# Patient Record
Sex: Male | Born: 1945
Health system: Southern US, Community
[De-identification: ages and names within clinical notes are randomized; demographics above are authoritative.]

## PROBLEM LIST (undated history)

## (undated) DIAGNOSIS — I10 Essential (primary) hypertension: Secondary | ICD-10-CM

## (undated) DIAGNOSIS — E785 Hyperlipidemia, unspecified: Secondary | ICD-10-CM

## (undated) DIAGNOSIS — I442 Atrioventricular block, complete: Secondary | ICD-10-CM

## (undated) DIAGNOSIS — I2510 Atherosclerotic heart disease of native coronary artery without angina pectoris: Secondary | ICD-10-CM

## (undated) DIAGNOSIS — G4733 Obstructive sleep apnea (adult) (pediatric): Secondary | ICD-10-CM

## (undated) HISTORY — DX: Atrioventricular block, complete: I44.2

## (undated) HISTORY — DX: Obstructive sleep apnea (adult) (pediatric): G47.33

## (undated) HISTORY — DX: Atherosclerotic heart disease of native coronary artery without angina pectoris: I25.10

## (undated) HISTORY — DX: Essential (primary) hypertension: I10

## (undated) HISTORY — PX: CARDIAC CATHETERIZATION: SHX172

## (undated) HISTORY — DX: Hyperlipidemia, unspecified: E78.5

---

## 1999-02-05 ENCOUNTER — Other Ambulatory Visit: Admission: RE | Admit: 1999-02-05 | Discharge: 1999-02-05 | Payer: Self-pay | Admitting: Orthopedic Surgery

## 2002-01-05 ENCOUNTER — Emergency Department (HOSPITAL_COMMUNITY): Admission: EM | Admit: 2002-01-05 | Discharge: 2002-01-06 | Payer: Self-pay | Admitting: Emergency Medicine

## 2004-09-22 ENCOUNTER — Ambulatory Visit: Payer: Self-pay | Admitting: Internal Medicine

## 2004-11-23 ENCOUNTER — Ambulatory Visit: Payer: Self-pay | Admitting: Internal Medicine

## 2005-05-18 ENCOUNTER — Ambulatory Visit: Payer: Self-pay | Admitting: Internal Medicine

## 2005-06-06 ENCOUNTER — Encounter: Admission: RE | Admit: 2005-06-06 | Discharge: 2005-06-06 | Payer: Self-pay | Admitting: Internal Medicine

## 2005-06-27 ENCOUNTER — Ambulatory Visit (HOSPITAL_COMMUNITY): Admission: RE | Admit: 2005-06-27 | Discharge: 2005-06-27 | Payer: Self-pay | Admitting: Internal Medicine

## 2005-10-30 ENCOUNTER — Ambulatory Visit: Payer: Self-pay | Admitting: Internal Medicine

## 2005-11-01 ENCOUNTER — Ambulatory Visit: Payer: Self-pay | Admitting: Cardiology

## 2005-11-15 ENCOUNTER — Ambulatory Visit: Payer: Self-pay | Admitting: Internal Medicine

## 2006-01-09 ENCOUNTER — Ambulatory Visit: Payer: Self-pay | Admitting: Internal Medicine

## 2006-01-22 ENCOUNTER — Ambulatory Visit: Payer: Self-pay | Admitting: Internal Medicine

## 2006-07-23 ENCOUNTER — Ambulatory Visit: Payer: Self-pay | Admitting: Internal Medicine

## 2006-07-25 ENCOUNTER — Ambulatory Visit: Payer: Self-pay | Admitting: Internal Medicine

## 2006-08-07 ENCOUNTER — Ambulatory Visit: Payer: Self-pay | Admitting: Cardiovascular Disease

## 2006-12-21 ENCOUNTER — Ambulatory Visit: Payer: Self-pay | Admitting: Internal Medicine

## 2007-04-15 ENCOUNTER — Telehealth: Payer: Self-pay | Admitting: Internal Medicine

## 2007-04-17 DIAGNOSIS — I059 Rheumatic mitral valve disease, unspecified: Secondary | ICD-10-CM | POA: Insufficient documentation

## 2007-07-16 ENCOUNTER — Telehealth (INDEPENDENT_AMBULATORY_CARE_PROVIDER_SITE_OTHER): Payer: Self-pay | Admitting: *Deleted

## 2007-07-19 ENCOUNTER — Ambulatory Visit: Payer: Self-pay | Admitting: Internal Medicine

## 2007-07-21 LAB — CONVERTED CEMR LAB
Total CHOL/HDL Ratio: 3.5
Triglycerides: 193 mg/dL — ABNORMAL HIGH (ref 0–149)

## 2007-07-22 ENCOUNTER — Encounter (INDEPENDENT_AMBULATORY_CARE_PROVIDER_SITE_OTHER): Payer: Self-pay | Admitting: *Deleted

## 2007-08-05 ENCOUNTER — Ambulatory Visit: Payer: Self-pay | Admitting: Internal Medicine

## 2007-08-05 LAB — CONVERTED CEMR LAB: HDL goal, serum: 40 mg/dL

## 2007-08-06 ENCOUNTER — Encounter (INDEPENDENT_AMBULATORY_CARE_PROVIDER_SITE_OTHER): Payer: Self-pay | Admitting: *Deleted

## 2007-09-14 ENCOUNTER — Encounter: Payer: Self-pay | Admitting: Internal Medicine

## 2007-10-03 ENCOUNTER — Ambulatory Visit: Payer: Self-pay | Admitting: Internal Medicine

## 2008-02-11 ENCOUNTER — Ambulatory Visit: Payer: Self-pay | Admitting: Internal Medicine

## 2008-02-11 LAB — CONVERTED CEMR LAB
Cholesterol: 184 mg/dL (ref 0–200)
HDL: 48.4 mg/dL (ref >39.0)
Triglycerides: 230 mg/dL (ref 0–149)
VLDL: 46 mg/dL — ABNORMAL HIGH (ref 0–40)

## 2008-02-17 ENCOUNTER — Ambulatory Visit: Payer: Self-pay | Admitting: Internal Medicine

## 2008-02-17 DIAGNOSIS — E785 Hyperlipidemia, unspecified: Secondary | ICD-10-CM

## 2008-02-17 DIAGNOSIS — G4730 Sleep apnea, unspecified: Secondary | ICD-10-CM

## 2008-02-17 DIAGNOSIS — E7800 Pure hypercholesterolemia, unspecified: Secondary | ICD-10-CM | POA: Insufficient documentation

## 2008-02-17 DIAGNOSIS — I471 Supraventricular tachycardia: Secondary | ICD-10-CM | POA: Insufficient documentation

## 2008-02-17 DIAGNOSIS — G4710 Hypersomnia, unspecified: Secondary | ICD-10-CM | POA: Insufficient documentation

## 2008-02-17 LAB — CONVERTED CEMR LAB: LDL Goal: 130 mg/dL

## 2008-02-18 ENCOUNTER — Encounter (INDEPENDENT_AMBULATORY_CARE_PROVIDER_SITE_OTHER): Payer: Self-pay | Admitting: *Deleted

## 2008-02-24 ENCOUNTER — Encounter (INDEPENDENT_AMBULATORY_CARE_PROVIDER_SITE_OTHER): Payer: Self-pay | Admitting: *Deleted

## 2008-02-24 ENCOUNTER — Ambulatory Visit: Payer: Self-pay | Admitting: Internal Medicine

## 2008-02-24 LAB — CONVERTED CEMR LAB: OCCULT 3: NEGATIVE

## 2008-03-03 ENCOUNTER — Ambulatory Visit: Payer: Self-pay | Admitting: Pulmonary Disease

## 2008-04-14 DIAGNOSIS — G4733 Obstructive sleep apnea (adult) (pediatric): Secondary | ICD-10-CM | POA: Insufficient documentation

## 2008-06-04 ENCOUNTER — Ambulatory Visit: Payer: Self-pay | Admitting: Internal Medicine

## 2008-06-04 DIAGNOSIS — R42 Dizziness and giddiness: Secondary | ICD-10-CM

## 2008-06-04 DIAGNOSIS — R9431 Abnormal electrocardiogram [ECG] [EKG]: Secondary | ICD-10-CM

## 2008-06-08 ENCOUNTER — Ambulatory Visit: Payer: Self-pay

## 2008-06-08 ENCOUNTER — Encounter: Payer: Self-pay | Admitting: Internal Medicine

## 2008-06-15 ENCOUNTER — Ambulatory Visit: Payer: Self-pay | Admitting: Cardiology

## 2008-06-16 ENCOUNTER — Encounter: Payer: Self-pay | Admitting: Internal Medicine

## 2008-06-16 ENCOUNTER — Ambulatory Visit: Payer: Self-pay

## 2008-06-16 ENCOUNTER — Ambulatory Visit: Payer: Self-pay | Admitting: Cardiology

## 2008-06-18 ENCOUNTER — Ambulatory Visit: Payer: Self-pay

## 2008-06-18 ENCOUNTER — Encounter: Payer: Self-pay | Admitting: Internal Medicine

## 2008-06-18 ENCOUNTER — Ambulatory Visit: Payer: Self-pay | Admitting: Cardiovascular Disease

## 2008-06-18 LAB — CONVERTED CEMR LAB
Basophils Absolute: 0 10*3/uL (ref 0.0–0.1)
Basophils Relative: 0.6 % (ref 0.0–3.0)
CO2: 29 meq/L (ref 19–32)
Calcium: 10.1 mg/dL (ref 8.4–10.5)
Chloride: 99 meq/L (ref 96–112)
Creatinine, Ser: 1.2 mg/dL (ref 0.4–1.5)
Glucose, Bld: 96 mg/dL (ref 70–99)
Hemoglobin: 15.5 g/dL (ref 13.0–17.0)
INR: 1 (ref 0.8–1.0)
Lymphocytes Relative: 36 % (ref 12.0–46.0)
MCHC: 34.1 g/dL (ref 30.0–36.0)
Monocytes Relative: 7.5 % (ref 3.0–12.0)
Neutro Abs: 3.1 10*3/uL (ref 1.4–7.7)
Neutrophils Relative %: 53.2 % (ref 43.0–77.0)
Prothrombin Time: 11.7 s (ref 10.9–13.3)
RBC: 4.55 M/uL (ref 4.22–5.81)
WBC: 6 10*3/uL (ref 4.5–10.5)
aPTT: 30.8 s — ABNORMAL HIGH (ref 21.7–29.8)

## 2008-06-19 ENCOUNTER — Ambulatory Visit: Payer: Self-pay

## 2008-06-19 ENCOUNTER — Inpatient Hospital Stay (HOSPITAL_BASED_OUTPATIENT_CLINIC_OR_DEPARTMENT_OTHER): Admission: RE | Admit: 2008-06-19 | Discharge: 2008-06-19 | Payer: Self-pay | Admitting: Cardiovascular Disease

## 2008-06-19 ENCOUNTER — Ambulatory Visit: Payer: Self-pay | Admitting: Cardiovascular Disease

## 2008-07-01 ENCOUNTER — Ambulatory Visit: Payer: Self-pay | Admitting: Cardiology

## 2008-07-10 ENCOUNTER — Ambulatory Visit: Payer: Self-pay | Admitting: Internal Medicine

## 2008-08-13 ENCOUNTER — Ambulatory Visit: Payer: Self-pay | Admitting: Internal Medicine

## 2008-08-13 DIAGNOSIS — N400 Enlarged prostate without lower urinary tract symptoms: Secondary | ICD-10-CM | POA: Insufficient documentation

## 2008-08-13 LAB — CONVERTED CEMR LAB
Cholesterol: 181 mg/dL (ref 0–200)
Direct LDL: 109.1 mg/dL
HDL: 46.5 mg/dL (ref >39.0)
Total CHOL/HDL Ratio: 3.9
VLDL: 52 mg/dL — ABNORMAL HIGH (ref 0–40)

## 2008-08-25 ENCOUNTER — Ambulatory Visit: Payer: Self-pay | Admitting: Internal Medicine

## 2008-09-02 ENCOUNTER — Ambulatory Visit: Payer: Self-pay | Admitting: Internal Medicine

## 2008-10-06 ENCOUNTER — Telehealth (INDEPENDENT_AMBULATORY_CARE_PROVIDER_SITE_OTHER): Payer: Self-pay | Admitting: *Deleted

## 2008-12-29 ENCOUNTER — Telehealth (INDEPENDENT_AMBULATORY_CARE_PROVIDER_SITE_OTHER): Payer: Self-pay | Admitting: *Deleted

## 2009-01-05 ENCOUNTER — Ambulatory Visit: Payer: Self-pay | Admitting: Cardiology

## 2009-01-08 ENCOUNTER — Ambulatory Visit: Payer: Self-pay | Admitting: Cardiology

## 2009-01-08 LAB — CONVERTED CEMR LAB
Alkaline Phosphatase: 41 units/L (ref 39–117)
Bilirubin, Direct: 0.2 mg/dL (ref 0.0–0.3)
Calcium: 9.6 mg/dL (ref 8.4–10.5)
GFR calc Af Amer: 88 mL/min
HDL: 46.8 mg/dL (ref >39.0)
Potassium: 4.2 meq/L (ref 3.5–5.1)
Sodium: 138 meq/L (ref 135–145)
Total Bilirubin: 0.9 mg/dL (ref 0.3–1.2)
Total CHOL/HDL Ratio: 3.7
VLDL: 33 mg/dL (ref 0–40)

## 2009-01-11 ENCOUNTER — Telehealth: Payer: Self-pay | Admitting: Internal Medicine

## 2009-01-20 ENCOUNTER — Encounter: Payer: Self-pay | Admitting: Internal Medicine

## 2009-02-23 ENCOUNTER — Ambulatory Visit: Payer: Self-pay | Admitting: Internal Medicine

## 2009-02-25 ENCOUNTER — Encounter (INDEPENDENT_AMBULATORY_CARE_PROVIDER_SITE_OTHER): Payer: Self-pay | Admitting: *Deleted

## 2009-03-02 ENCOUNTER — Ambulatory Visit: Payer: Self-pay | Admitting: Internal Medicine

## 2009-03-02 DIAGNOSIS — R7303 Prediabetes: Secondary | ICD-10-CM | POA: Insufficient documentation

## 2009-03-02 DIAGNOSIS — R739 Hyperglycemia, unspecified: Secondary | ICD-10-CM

## 2009-03-03 ENCOUNTER — Encounter (INDEPENDENT_AMBULATORY_CARE_PROVIDER_SITE_OTHER): Payer: Self-pay | Admitting: *Deleted

## 2009-04-16 ENCOUNTER — Encounter: Payer: Self-pay | Admitting: Gastroenterology

## 2009-04-20 ENCOUNTER — Ambulatory Visit: Payer: Self-pay | Admitting: Gastroenterology

## 2009-04-28 ENCOUNTER — Ambulatory Visit: Payer: Self-pay | Admitting: Gastroenterology

## 2009-07-20 ENCOUNTER — Ambulatory Visit: Payer: Self-pay | Admitting: Cardiology

## 2009-07-22 ENCOUNTER — Telehealth (INDEPENDENT_AMBULATORY_CARE_PROVIDER_SITE_OTHER): Payer: Self-pay | Admitting: *Deleted

## 2009-07-23 ENCOUNTER — Ambulatory Visit: Payer: Self-pay | Admitting: Cardiology

## 2009-07-23 DIAGNOSIS — R000 Tachycardia, unspecified: Secondary | ICD-10-CM

## 2009-07-27 LAB — CONVERTED CEMR LAB
Albumin: 4 g/dL (ref 3.5–5.2)
Cholesterol: 176 mg/dL (ref 0–200)
HDL: 49 mg/dL (ref >39.00)
LDL Cholesterol: 87 mg/dL (ref 0–99)
Total Protein: 6.9 g/dL (ref 6.0–8.3)
Triglycerides: 198 mg/dL — ABNORMAL HIGH (ref 0.0–149.0)
VLDL: 39.6 mg/dL (ref 0.0–40.0)

## 2009-08-18 ENCOUNTER — Ambulatory Visit: Payer: Self-pay | Admitting: Internal Medicine

## 2009-11-13 HISTORY — PX: COLONOSCOPY: SHX174

## 2009-11-26 ENCOUNTER — Encounter (INDEPENDENT_AMBULATORY_CARE_PROVIDER_SITE_OTHER): Payer: Self-pay | Admitting: *Deleted

## 2009-11-26 ENCOUNTER — Ambulatory Visit: Payer: Self-pay | Admitting: Internal Medicine

## 2009-11-27 ENCOUNTER — Encounter: Payer: Self-pay | Admitting: Internal Medicine

## 2009-11-28 LAB — CONVERTED CEMR LAB
Chloride: 98 meq/L (ref 96–112)
Creatinine, Ser: 1.04 mg/dL (ref 0.40–1.50)
Potassium: 4.1 meq/L (ref 3.5–5.3)
Sodium: 139 meq/L (ref 135–145)

## 2009-11-29 ENCOUNTER — Encounter (INDEPENDENT_AMBULATORY_CARE_PROVIDER_SITE_OTHER): Payer: Self-pay | Admitting: *Deleted

## 2009-11-30 ENCOUNTER — Encounter (INDEPENDENT_AMBULATORY_CARE_PROVIDER_SITE_OTHER): Payer: Self-pay | Admitting: *Deleted

## 2009-12-02 ENCOUNTER — Ambulatory Visit: Payer: Self-pay

## 2009-12-02 ENCOUNTER — Encounter: Payer: Self-pay | Admitting: Cardiology

## 2009-12-13 ENCOUNTER — Ambulatory Visit: Payer: Self-pay | Admitting: Cardiology

## 2009-12-13 ENCOUNTER — Ambulatory Visit: Payer: Self-pay

## 2009-12-29 ENCOUNTER — Telehealth (INDEPENDENT_AMBULATORY_CARE_PROVIDER_SITE_OTHER): Payer: Self-pay | Admitting: *Deleted

## 2009-12-29 ENCOUNTER — Ambulatory Visit: Payer: Self-pay | Admitting: Internal Medicine

## 2010-01-12 ENCOUNTER — Ambulatory Visit: Payer: Self-pay | Admitting: Cardiology

## 2010-02-17 ENCOUNTER — Ambulatory Visit: Payer: Self-pay | Admitting: Internal Medicine

## 2010-02-28 LAB — CONVERTED CEMR LAB
Bilirubin, Direct: 0.1 mg/dL (ref 0.0–0.3)
Calcium: 9.8 mg/dL (ref 8.4–10.5)
Chloride: 103 meq/L (ref 96–112)
Creatinine, Ser: 0.9 mg/dL (ref 0.4–1.5)
HDL: 47.8 mg/dL (ref >39.00)
LDL Cholesterol: 87 mg/dL (ref 0–99)
Total Bilirubin: 0.8 mg/dL (ref 0.3–1.2)
Total CHOL/HDL Ratio: 4
Triglycerides: 178 mg/dL — ABNORMAL HIGH (ref 0.0–149.0)

## 2010-07-21 ENCOUNTER — Ambulatory Visit: Payer: Self-pay | Admitting: Cardiology

## 2010-08-03 ENCOUNTER — Telehealth (INDEPENDENT_AMBULATORY_CARE_PROVIDER_SITE_OTHER): Payer: Self-pay | Admitting: *Deleted

## 2010-09-12 ENCOUNTER — Ambulatory Visit: Payer: Self-pay | Admitting: Internal Medicine

## 2010-09-12 DIAGNOSIS — S93409A Sprain of unspecified ligament of unspecified ankle, initial encounter: Secondary | ICD-10-CM | POA: Insufficient documentation

## 2010-12-03 ENCOUNTER — Encounter: Payer: Self-pay | Admitting: Internal Medicine

## 2010-12-11 LAB — CONVERTED CEMR LAB
AST: 31 units/L (ref 0–37)
Basophils Relative: 0.5 % (ref 0.0–3.0)
Direct LDL: 113 mg/dL
Eosinophils Relative: 4.3 % (ref 0.0–5.0)
HCT: 46.9 % (ref 39.0–52.0)
Hemoglobin: 15.1 g/dL (ref 13.0–17.0)
MCV: 101.5 fL — ABNORMAL HIGH (ref 78.0–100.0)
Monocytes Absolute: 0.4 10*3/uL (ref 0.1–1.0)
Neutrophils Relative %: 49.3 % (ref 43.0–77.0)
RBC: 4.62 M/uL (ref 4.22–5.81)
Total CHOL/HDL Ratio: 4.2
Triglycerides: 222 mg/dL (ref 0–149)
VLDL: 44 mg/dL — ABNORMAL HIGH (ref 0–40)
WBC: 4.3 10*3/uL — ABNORMAL LOW (ref 4.5–10.5)

## 2010-12-15 NOTE — Assessment & Plan Note (Signed)
Summary: 6 MONTH ROV/SL   Referring Naomie Crow:  Marga Melnick Primary Beverlee Wilmarth:  Marga Melnick MD  CC:  6 month rov.  pt states he is feeling well.  History of Present Illness: 65 yo with history of AVNRT as well as conduction system disease (2nd degree AV block while taking beta blocker and calcium channel blocker) presents for followup.  In 1/11, he was walking on the beach and started to feel lightheaded.  This sensation actually stayed with him for several weeks.  He did not pass out.  He would check his blood pressure and it was running systolic in the 150s.  He thought that maybe his BP was too high, so he increased his HCTZ to 25 mg daily.  This actually brought his BP down to a normal range and his lightheadedness resolved.   He saw Dr. Alwyn Ren and had a holter monitor done. This showed heart rate ranging 59-119 bpm.  There was one 3.9 second pause with heart block (5 p waves with no QRSs).  This occurred at night presumably while he was sleeping.    He continues to walk about 3 miles three times a week with no dyspnea or chest pain.   He has had no more significant dizzy spells or palpitations.  He occasionally gets mildly dizzy when working outdoors in the heat.  I suspect this is related to mild dehydration.  Event monitor this year showed only NSR with 1st degree AV block. Also reports significant daytime sleepiness as well as snoring and gasping at night.  I suspect he has OSA but he does not want a sleep study and says that he would not be able to tolerate CPAP.   Labs (2/10): LDL 93, HDL 47, creatinine 1.1 Labs (9/10): LDL 87, HDL 49 Labs (1/11): TSH normal, K 4.1, creatinine 1.04 Labs (4/11): LDL 87, HDL 48, LFTs normal  Current Medications (verified): 1)  Mobic 7.5 Mg Tabs (Meloxicam) .... One Half To One Tab Daily As Needed 2)  Folic Acid 1 Mg Tabs (Folic Acid) .Marland Kitchen.. 1 By Mouth Qd 3)  Vytorin 10-40 Mg  Tabs (Ezetimibe-Simvastatin) .Marland Kitchen.. 1 By Mouth Qd 4)  Cialis 20 Mg  Tabs  (Tadalafil) .... Prn 5)  Aspirin Adult Low Strength 81 Mg  Tbec (Aspirin) .Marland Kitchen.. 1 By Mouth Once Daily 6)  Hydrochlorothiazide 25 Mg Tabs (Hydrochlorothiazide) .Marland Kitchen.. 1 Tab Once Daily 7)  Fish Oil 1000 Mg Caps (Omega-3 Fatty Acids) .... Take 1 Capsule By Mouth Two Times A Day 8)  Clobetasol Propionate 0.05 % Crea (Clobetasol Propionate) .... Apply As Needed  Allergies (verified): 1)  ! * Viagra 2)  ! Lipitor  Past History:  Past Medical History: Reviewed history from 01/12/2010 and no changes required. 1. Hyperlipidemia. 2. Nonobstructive coronary artery disease.  The patient had a left heart catheterization in August 2009 showing a 30% mid circumflex lesion, otherwise coronaries were clean angiographically. 3. Hypertension. 4. Tachy-brady syndrome.  The patient has had episodes of both SVT and second-degree AV block with symptomatic bradycardia.  The patient's SVT is felt to be most likely AVNRT.  In the setting of being on diltiazem and metoprolol to suppress his SVT, he developed second-degree AV block.  Holter monitor showed 2:1 AV block as well as episodes that may have been type II second-degree AV block.  The patient was taken off his calcium-channel blocker and his beta-blocker with resolution of heart block.  Holter monitor was done 1/11 showing PACs and one pause of 3.9 seconds  resumably while patient was sleeping with heart block (5 p waves without QRSs).  HR range 59-119.  Event monitor (2/11): only NSR with 1st degree AV block.  5. Past history of tobacco abuse. 6. Echo done in August 2009 showing EF 50-55% with no regional wall motion abnormalities, paradoxical septal motion, mild diastolic dysfunction, mild left atrial enlargement, normal RV size and function.  7. Probable OSA: Patient refuses sleep study and says that he would not use CPAP.  This may be the cause of his nocturnal arrhythmias.    Family History: Reviewed history from 03/03/2008 and no changes required. Family  History Diabetes 1st degree relative Family History MI Family History PVD  heart disease:  maternal grandfather  Social History: Reviewed history from 07/20/2009 and no changes required. Former Smoker Alcohol use-yes pt is married. pt does not have any children.  exercise- walk 3 miles 4 days a week at a minimum with no CV symptoms.   Vital Signs:  Patient profile:   65 year old male Height:      77 inches Weight:      240 pounds BMI:     28.56 Pulse rate:   84 / minute Pulse rhythm:   regular BP sitting:   130 / 82  (left arm) Cuff size:   regular  Vitals Entered By: Judithe Modest CMA (July 21, 2010 3:22 PM)  Physical Exam  General:  Well developed, well nourished, in no acute distress. Neck:  Neck supple, no JVD. No masses, thyromegaly or abnormal cervical nodes. Lungs:  Clear bilaterally to auscultation and percussion. Heart:  Non-displaced PMI, chest non-tender; regular rate and rhythm, S1, S2 without murmurs, rubs or gallops. Carotid upstroke normal, no bruit.  Pedals normal pulses. No edema, no varicosities. Abdomen:  Bowel sounds positive; abdomen soft and non-tender without masses, organomegaly, or hernias noted. No hepatosplenomegaly. Extremities:  No clubbing or cyanosis. Neurologic:  Alert and oriented x 3. Psych:  Normal affect.   Impression & Recommendations:  Problem # 1:  TACHYCARDIA (ICD-785.0) History of probable AVNRT.  No significant palpitations recently.   His only option for treatment would be ablation as we are unable to use nodal blockade given prior high grade heart block on beta blocker/calcium channel blocker.    Problem # 2:  ATRIOVENTRICULAR BLOCK, 2ND DEGREE (ICD-426.13) Assessment: Improved No further symptomatic bradycardia off beta blocker and calcium channel blocker.  He does have evidence of conduction system disease (IVCD and 1st degree AV block) on his ECG.  This could progress and we will follow closely.    Problem # 3:   UNSPECIFIED ESSENTIAL HYPERTENSION (ICD-401.9) BP is within normal range today.  Patient Instructions: 1)  Your physician recommends that you continue on your current medications as directed. Please refer to the Current Medication list given to you today. 2)  Your physician wants you to follow-up in: 6 months with Dr Shirlee Latch.  You will receive a reminder letter in the mail two months in advance. If you don't receive a letter, please call our office to schedule the follow-up appointment.

## 2010-12-15 NOTE — Procedures (Signed)
Summary: summary report  summary report   Imported By: Mirna Mires 12/14/2009 11:29:21  _____________________________________________________________________  External Attachment:    Type:   Image     Comment:   External Document

## 2010-12-15 NOTE — Assessment & Plan Note (Signed)
Summary: 1 MOMTH/F/U EVENT/D.MILLER   Referring Provider:  Marga Melnick Primary Provider:  Marga Melnick MD  CC:  pt follow up on event monitor.  Pt feeling well.Marland Kitchen  History of Present Illness: 65 yo with history of AVNRT as well as conduction system disease (2nd degree AV block while taking beta blocker and calcium channel blocker) presents for followup.  On 11/14/09, he was walking on the beach and started to feel lightheaded.  This sensation actually stayed with him for several weeks.  He did not pass out.  He would check his blood pressure and it was running systolic in the 150s.  He thought that maybe his BP was too high, so he increased his HCTZ to 25 mg daily.  This actually brought his BP down to a normal range and his lightheadedness resolved.   He saw Dr. Alwyn Ren and had a holter monitor done. This showed heart rate ranging 59-119 bpm.  There was one 3.9 second pause with heart block (5 p waves with no QRSs).  This occurred at night presumably while he was sleeping.    He continues to walk about 3 miles three times a week with no dyspnea or chest pain.   He has had no more dizzy spells or palpitations.  We did an event monitor showing only NSR with 1st degree AV block. Also reports significant daytime sleepiness as well as snoring and gasping at night.  I suspect he has OSA but he does not want a sleep study and says that he would not be able to tolerate CPAP.   Labs (2/10): LDL 93, HDL 47, creatinine 1.1 Labs (9/10): LDL 87, HDL 49 Labs (1/11): TSH normal, K 4.1, creatinine 1.04  Current Medications (verified): 1)  Mobic 7.5 Mg Tabs (Meloxicam) .... One Half To One Tab Daily As Needed 2)  Folic Acid 1 Mg Tabs (Folic Acid) .Marland Kitchen.. 1 By Mouth Qd 3)  Vytorin 10-40 Mg  Tabs (Ezetimibe-Simvastatin) .Marland Kitchen.. 1 By Mouth Qd 4)  Cialis 20 Mg  Tabs (Tadalafil) .... Prn 5)  Aspirin Adult Low Strength 81 Mg  Tbec (Aspirin) .Marland Kitchen.. 1 By Mouth Once Daily 6)  Hydrochlorothiazide 25 Mg Tabs  (Hydrochlorothiazide) .Marland Kitchen.. 1 Tab Once Daily 7)  Fish Oil 1000 Mg Caps (Omega-3 Fatty Acids) .... Take 1 Capsule By Mouth Two Times A Day 8)  Hydrochlorothiazide 25 Mg Tabs (Hydrochlorothiazide) .... One Tablet Daily 9)  Clobetasol Propionate 0.05 % Crea (Clobetasol Propionate) .... Apply As Needed  Allergies (verified): 1)  ! * Viagra 2)  ! Lipitor  Past History:  Past Medical History: 1. Hyperlipidemia. 2. Nonobstructive coronary artery disease.  The patient had a left heart catheterization in August 2009 showing a 30% mid circumflex lesion, otherwise coronaries were clean angiographically. 3. Hypertension. 4. Tachy-brady syndrome.  The patient has had episodes of both SVT and second-degree AV block with symptomatic bradycardia.  The patient's SVT is felt to be most likely AVNRT.  In the setting of being on diltiazem and metoprolol to suppress his SVT, he developed second-degree AV block.  Holter monitor showed 2:1 AV block as well as episodes that may have been type II second-degree AV block.  The patient was taken off his calcium-channel blocker and his beta-blocker with resolution of heart block.  Holter monitor was done 1/11 showing PACs and one pause of 3.9 seconds resumably while patient was sleeping with heart block (5 p waves without QRSs).  HR range 59-119.  Event monitor (2/11): only NSR with 1st degree  AV block.  5. Past history of tobacco abuse. 6. Echo done in August 2009 showing EF 50-55% with no regional wall motion abnormalities, paradoxical septal motion, mild diastolic dysfunction, mild left atrial enlargement, normal RV size and function.  7. Probable OSA: Patient refuses sleep study and says that he would not use CPAP.  This may be the cause of his nocturnal arrhythmias.    Family History: Reviewed history from 03/03/2008 and no changes required. Family History Diabetes 1st degree relative Family History MI Family History PVD  heart disease:  maternal  grandfather  Social History: Reviewed history from 07/20/2009 and no changes required. Former Smoker Alcohol use-yes pt is married. pt does not have any children.  exercise- walk 3 miles 4 days a week at a minimum with no CV symptoms.   Review of Systems       All systems reviewed and negative except as per HPI.   Vital Signs:  Patient profile:   65 year old male Height:      77 inches Weight:      239 pounds BMI:     28.44 Pulse rate:   72 / minute Pulse rhythm:   regular BP sitting:   149 / 95  (left arm) Cuff size:   large  Vitals Entered By: Judithe Modest CMA (January 12, 2010 11:55 AM)  Physical Exam  General:  Well developed, well nourished, in no acute distress. Neck:  Neck supple, no JVD. No masses, thyromegaly or abnormal cervical nodes. Lungs:  Clear bilaterally to auscultation and percussion. Heart:  Non-displaced PMI, chest non-tender; regular rate and rhythm, S1, S2 without murmurs, rubs or gallops. Carotid upstroke normal, no bruit.  Pedals normal pulses. No edema, no varicosities. Abdomen:  Bowel sounds positive; abdomen soft and non-tender without masses, organomegaly, or hernias noted. No hepatosplenomegaly. Extremities:  No clubbing or cyanosis. Neurologic:  Alert and oriented x 3. Psych:  Normal affect.   Impression & Recommendations:  Problem # 1:  DIZZINESS AND GIDDINESS (ICD-780.4) Patient has had no further lightheaded spells.  We did an event monitor showing no significant brady or tachyarrhythmias so far.  He had a holter done showing a 3.9 second pause probably while sleeping in 1/11.  Patient probably has OSA though he has refused workup.  This is the likely cause of his nocturnal arrhythmias.  These could potentially improve if he would use CPAP.  No evidence of recurrent high grade AV block off beta blockers and calcium channel blockers.   Problem # 2:  TACHYCARDIA (ICD-785.0) History of probable AVNRT.  He says that he has only infrequent runs  of racing heart rate that only last for seconds. No tachyarrhythmias on recent event monitor.  His only option for treatment if treatment is thought to be needed would be ablation as we are unable to use nodal blockade.    Problem # 3:  UNSPECIFIED ESSENTIAL HYPERTENSION (ICD-401.9) BP is mildly elevated today.  If continues to stay up, would consider stopping HCTZ and changing to chlorthalidone which would be a stronger antihypertensive.   Patient Instructions: 1)  Your physician recommends that you schedule a follow-up appointment in: 6 months

## 2010-12-15 NOTE — Letter (Signed)
Summary: Results Follow up Letter  [facility]  [address] [place], [place] [address]   Phone: [phone]  Fax: [phone]    11/29/2009 MRN: [id number]  [facility] [address] [place], [place]  [address]  Dear Mr. CORKINS,  The following are the results of your recent test(s):  Test         Result    Pap Smear:        Normal _____  Not Normal _____ Comments: ______________________________________________________ Cholesterol: LDL(Bad cholesterol):         Your goal is less than:         HDL (Good cholesterol):       Your goal is more than: Comments:  ______________________________________________________ Mammogram:        Normal _____  Not Normal _____ Comments:  ___________________________________________________________________ Hemoccult:        Normal _____  Not normal _______ Comments:    _____________________________________________________________________ Other Tests: Please see attached labs done on 11/27/2009    We routinely do not discuss normal results over the telephone.  If you desire a copy of the results, or you have any questions about this information we can discuss them at your next office visit.   Sincerely,

## 2010-12-15 NOTE — Progress Notes (Signed)
Summary: REFILL REQUEST  Phone Note Refill Request Call back at [phone] Message from:  Pharmacy on August 03, 2010 8:41 AM  Refills Requested: Medication #1:  MOBIC 7.5 MG TABS one half to one tab daily as needed   Dosage confirmed as above?Dosage Confirmed   Supply Requested: 3 months [facility]   Next Appointment Scheduled: NONE Initial call taken by: Lavell Islam,  August 03, 2010 8:43 AM    Prescriptions: MOBIC 7.5 MG TABS (MELOXICAM) one half to one tab daily as needed  #90 x 1   Entered by:   Shonna Chock CMA   Authorized by:   Marga Melnick MD   Signed by:   Shonna Chock CMA on 08/03/2010   Method used:   Print then Give to Patient   RxID:   [id number]

## 2010-12-15 NOTE — Letter (Signed)
Summary: Results Follow up Letter  [facility]  [address] [place], [place] [address]   Phone: [phone]  Fax: [phone]    11/30/2009 MRN: [id number]  [facility] [address] [place], [place]  [address]  Dear Mr. GRISWOLD,  The following are the results of your recent test(s):  Test         Result    Pap Smear:        Normal _____  Not Normal _____ Comments: ______________________________________________________ Cholesterol: LDL(Bad cholesterol):         Your goal is less than:         HDL (Good cholesterol):       Your goal is more than: Comments:  ______________________________________________________ Mammogram:        Normal _____  Not Normal _____ Comments:  ___________________________________________________________________ Hemoccult:        Normal _____  Not normal _______ Comments:    _____________________________________________________________________ Other Tests: Please see attached labs done on 11/26/2009    We routinely do not discuss normal results over the telephone.  If you desire a copy of the results, or you have any questions about this information we can discuss them at your next office visit.   Sincerely,

## 2010-12-15 NOTE — Assessment & Plan Note (Signed)
Summary: dizzy spells/kdc   Vital Signs:  Patient profile:   65 year old male Weight:      241.8 pounds Temp:     98.5 degrees F oral Pulse rate:   75 / minute Resp:     15 per minute BP supine:   130 / 88  (left arm) BP sitting:   134 / 86  (left arm)  Vitals Entered By: Shonna Chock (November 26, 2009 9:16 AM) CC: Dizzy Spells since 11/14/2009, patient also noticed B/P elevated x 2 days-Increased HCTZ to a whole tab instead of RX instruction (1/2 po qd ) Comments REVIEWED MED LIST, PATIENT AGREED DOSE AND INSTRUCTION CORRECT    Primary Care Provider:  Marga Melnick MD  CC:  Dizzy Spells since 11/14/2009 and patient also noticed B/P elevated x 2 days-Increased HCTZ to a whole tab instead of RX instruction (1/2 po qd ).  History of Present Illness: Onset 11/14/2009 while walking  after 30 minutes; it lasted for hours. Intermittently daily since , mainly positional , sitting up in bed or standing up No BPV symptoms; no relationship to meal timingOccasionally "fibrillate" with episodes, not every time.Rx: HCTZ 25 mg 1/2 once daily increased to 1 pill X 2 days with benefit.HE HAD PROFOUND HYPOTENSION ON HCTZ 25 MG WHEN  INITIALLY  PRESCRIBED. BP 144/91 pre increase in dose. PMH of Tachy/ Brady Syndrome with PSVT.  Allergies: 1)  ! * Viagra 2)  ! Lipitor  Review of Systems General:  Denies chills, fever, sweats, and weight loss. Eyes:  Denies blurring, double vision, and vision loss-both eyes; "Floaters " with spells. ENT:  Denies decreased hearing and ringing in ears. CV:  See HPI; Complains of lightheadness, near fainting, and palpitations; denies chest pain or discomfort. Neuro:  Complains of poor balance and visual disturbances; denies brief paralysis, disturbances in coordination, numbness, sensation of room spinning, tingling, and weakness.  Physical Exam  General:  well-nourished,in no acute distress; alert,appropriate and cooperative throughout examination Eyes:  No corneal or  conjunctival inflammation noted. EOMI. Perrla. Field of  Vision grossly normal.Minimal unsustained nystagmus Ears:  External ear exam shows no significant lesions or deformities.  Otoscopic examination reveals clear canals, tympanic membranes are intact bilaterally without bulging, retraction, inflammation or discharge. Hearing is grossly normal bilaterally.Tuning fork exam WNL Mouth:  Oral mucosa and oropharynx without lesions or exudates.  Tongue not deviated Heart:  Normal rate and regular rhythm. S1 and S2 normal without gallop, murmur, click, rub . S4 Pulses:  R and L carotid,radial  pulses are full and equal bilaterally Neurologic:  alert & oriented X3, cranial nerves II-XII intact, strength normal in all extremities, sensation intact to light touch, gait normal, DTRs symmetrical but 0-1/2+, finger-to-nose normal, and Romberg negative.   Skin:  Intact without suspicious lesions or rashes Psych:  memory intact for recent and remote, normally interactive, and good eye contact.     Impression & Recommendations:  Problem # 1:  DIZZINESS AND GIDDINESS (ICD-780.4)  Orders: Venipuncture ([address]) TLB-BMP (Basic Metabolic Panel-BMET) (80048-METABOL) TLB-TSH (Thyroid Stimulating Hormone) (84443-TSH) TLB-CBC Platelet - w/Differential (85025-CBCD) EKG w/ Interpretation ([address]) Cardiology Referral (Cardiology)  Problem # 2:  PAROXYSMAL SUPRAVENTRICULAR TACHYCARDIA (ICD-427.0)  His updated medication list for this problem includes:    Aspirin Adult Low Strength 81 Mg Tbec (Aspirin) .Marland Kitchen... 1 by mouth once daily  Orders: Venipuncture ([address]) TLB-BMP (Basic Metabolic Panel-BMET) (80048-METABOL) TLB-TSH (Thyroid Stimulating Hormone) (84443-TSH) TLB-CBC Platelet - w/Differential (85025-CBCD) EKG w/ Interpretation (93000) Cardiology Referral (Cardiology)  Complete  Medication List: 1)  Mobic 7.5 Mg Tabs (Meloxicam) .... One half to one tab daily as needed 2)  Folic Acid 1 Mg Tabs (Folic acid)  .Marland Kitchen.. 1 by mouth qd 3)  Vytorin 10-40 Mg Tabs (Ezetimibe-simvastatin) .Marland Kitchen.. 1 by mouth qd 4)  Cialis 20 Mg Tabs (Tadalafil) .... Prn 5)  Aspirin Adult Low Strength 81 Mg Tbec (Aspirin) .Marland Kitchen.. 1 by mouth once daily 6)  Hydrochlorothiazide 25 Mg Tabs (Hydrochlorothiazide) .... 1/2 by mouth once daily 7)  Fish Oil 1000 Mg Caps (Omega-3 fatty acids) .... Take 1 capsule by mouth two times a day  Patient Instructions: 1)  Take records if you travel; do not drive until evaluation completed.  Appended Document: Orders Update    Clinical Lists Changes  Orders: Added new Test order of T- * Misc. Laboratory test [phone]) - Signed

## 2010-12-15 NOTE — Letter (Signed)
Summary: Primary Care Consult Scheduled Letter  [facility]  [address] [place], [place] [address]   Phone: [phone]  Fax: [phone]      11/26/2009 MRN: [id number]  [facility] [address] [place], [place]  [address]    Dear [name],    We have scheduled an appointment for you.  At the recommendation of Dr. Marga Melnick, we have scheduled you a consult with Dr. Marca Ancona with [name] on 12-13-2009 at 12:00pm.  Their address is [address] [place], [place] [address]. The office phone number is [phone].  If this appointment day and time is not convenient for you, please feel free to call the office of the doctor you are being referred to at the number listed above and reschedule the appointment.    It is important for you to keep your scheduled appointments. We are here to make sure you are given good patient care.   Thank you,    Renee, Patient Care Coordinator [facility]    **IF YOU ARE UNABLE TO KEEP THIS APPOINTMENT, OR NEED TO RESCHEDULE, PLEASE GIVE DR. MCLEAN'S OFFICE A 24 HOUR NOTICE TO AVOID A $50 FEE**

## 2010-12-15 NOTE — Assessment & Plan Note (Signed)
Summary: rov.dizziness & giddiness   Visit Type:  Follow-up Referring Provider:  Marga Melnick Primary Provider:  Marga Melnick MD  CC:  dizziness has gotten better since  last visit- Pt follow up visit since he wore heart monitor. Pt has increased his  HCTZ 25mg  1 whole tab qd.  History of Present Illness: 65 yo with history of AVNRT as well as conduction system disease (2nd degree AV block while taking beta blocker and calcium channel blocker) presents for followup.  On 11/14/09, he [place] the [place] and started to feel lightheaded.  This sensation actually stayed with him for several weeks.  He did not pass out.  He would check his blood pressure and it was running systolic in the 150s.  He thought that maybe his BP was too high, so he increased his HCTZ to 25 mg daily.  This actually brought his BP down to a normal range and his lightheadedness resolved.  He continues to walk about 3 miles three times a week with no dyspnea or chest pain.  He has had 3-4 episodes of brief racing heart rate over the last couple of months.  These terminate quickly with deep breathing.  He saw Dr. Alwyn Ren and had a holter monitor done. This showed heart rate ranging 59-119 bpm.  There was one 3.9 second pause with heart block (5 p waves with no QRSs).  This occurred at night presumably while he was sleeping.    Labs (2/10): LDL 93, HDL 47, creatinine 1.1 Labs (9/10): LDL 87, HDL 49 Labs (1/11): TSH normal, K 4.1, creatinine 1.04  Current Medications (verified): 1)  Mobic 7.5 Mg Tabs (Meloxicam) .... One Half To One Tab Daily As Needed 2)  Folic Acid 1 Mg Tabs (Folic Acid) .Marland Kitchen.. 1 By Mouth Qd 3)  Vytorin 10-40 Mg  Tabs (Ezetimibe-Simvastatin) .Marland Kitchen.. 1 By Mouth Qd 4)  Cialis 20 Mg  Tabs (Tadalafil) .... Prn 5)  Aspirin Adult Low Strength 81 Mg  Tbec (Aspirin) .Marland Kitchen.. 1 By Mouth Once Daily 6)  Hydrochlorothiazide 25 Mg Tabs (Hydrochlorothiazide) .Marland Kitchen.. 1 Tab Once Daily 7)  Fish Oil 1000 Mg Caps (Omega-3 Fatty  Acids) .... Take 1 Capsule By Mouth Two Times A Day 8)  Hydrochlorothiazide 25 Mg Tabs (Hydrochlorothiazide) .... One Tablet Daily  Allergies (verified): 1)  ! * Viagra 2)  ! Lipitor  Past History:  Past Medical History: 1. Hyperlipidemia. 2. Nonobstructive coronary artery disease.  The patient had a left heart catheterization in August 2009 showing a 30% mid circumflex lesion, otherwise coronaries were clean angiographically. 3. Hypertension. 4. Tachy-brady syndrome.  The patient has had episodes of both SVT and second-degree AV block with symptomatic bradycardia.  The patient's SVT is felt to be most likely AVNRT.  In the setting of being on diltiazem and metoprolol to suppress his SVT, he developed second-degree AV block.  Holter monitor showed 2:1 AV block as well as episodes that may have been type II second-degree AV block.  The patient was taken off his calcium-channel blocker and his beta-blocker with resolution of heart block.  Holter monitor was done 1/11 showing PACs and one pause of 3.9 seconds resumably while patient was sleeping with heart block (5 p waves without QRSs).  HR range 59-119.  5. Past history of tobacco abuse. 6. Echo done in August 2009 showing EF 50-55% with no regional wall motion abnormalities, paradoxical septal motion, mild diastolic dysfunction, mild left atrial enlargement, normal RV size and function.   Family History:  Reviewed history from 03/03/2008 and no changes required. Family History Diabetes 1st degree relative Family History MI Family History PVD  heart disease:  maternal grandfather  Social History: Reviewed history from 07/20/2009 and no changes required. Former Smoker Alcohol use-yes pt is married. pt does not have any children.  exercise- walk 3 miles 4 days a week at a minimum with no CV symptoms.   Review of Systems       All systems reviewed and negative except as per HPI.   Vital Signs:  Patient profile:   65 year old  male Height:      77 inches Weight:      239 pounds BMI:     28.44 Pulse rate:   76 / minute BP sitting:   138 / 88  (left arm) Cuff size:   large  Vitals Entered By: Burnett Kanaris, CNA (December 13, 2009 12:03 PM)  Physical Exam  General:  Well developed, well nourished, in no acute distress. Neck:  Neck supple, no JVD. No masses, thyromegaly or abnormal cervical nodes. Lungs:  Clear bilaterally to auscultation and percussion. Heart:  Non-displaced PMI, chest non-tender; regular rate and rhythm, S1, S2 without murmurs, rubs or gallops. Carotid upstroke normal, no bruit.  Pedals normal pulses. No edema, no varicosities. Abdomen:  Bowel sounds positive; abdomen soft and non-tender without masses, organomegaly, or hernias noted. No hepatosplenomegaly. Extremities:  No clubbing or cyanosis. Neurologic:  Alert and oriented x 3. Psych:  Normal affect.   Impression & Recommendations:  Problem # 1:  LIGHTHEADEDNESS I am not sure that this is related to an arrhythmia.  He was lightheaded constantly for about 2-3 weeks and BP was running high.  Symptoms resolved with increasing his HCTZ.  He does have known AVNRT and has had 2nd degree heart block in the past.  His holter showed one 3.9 second pause with heart block and no escape for 5 beats.  This was at night presumably while he was asleep.  No other significant events and no runs of AVNRT.  I am going to have him do a 3 week event monitor to see if there are frequent pauses or runs of AVNRT.  No nodal blockers should be given to him.  Will need to explore more in-depth the issue of OSA with him when he returns for followup.  I do not see in the system where he has had a sleep study and OSA could certainly lead to nocturnal arrhythmias.   Problem # 2:  TACHYCARDIA (ICD-785.0) History of probable AVNRT.  He says that he has only infrequent runs of racing heart rate that only last for seconds.  Will reassess with 3- week event monitor.  His only  option for treatment if treatment is thought to be needed would be ablation as we are unable to use nodal blockade.    Problem # 3:  UNSPECIFIED ESSENTIAL HYPERTENSION (ICD-401.9) BP ok.  Continue current dose of HCTZ.   Other Orders: Event (Event)  Patient Instructions: 1)  Your physician has recommended that you wear an event monitor.  Event monitors are medical devices that record the heart's electrical activity. Doctors most often use these monitors to diagnose arrhythmias. Arrhythmias are problems with the speed or rhythm of the heartbeat. The monitor is a small, portable device. You can wear one while you do your normal daily activities. This is usually used to diagnose what is causing palpitations/syncope (passing out). 2)  Your physician recommends that you schedule a follow-up  appointment in: 1 month with Dr Marca Ancona Prescriptions: HYDROCHLOROTHIAZIDE 25 MG TABS (HYDROCHLOROTHIAZIDE) one tablet daily  #90 x 3   Entered by:   Katina Dung, RN, BSN   Authorized by:   Marca Ancona, MD   Signed by:   Katina Dung, RN, BSN on 12/13/2009   Method used:   Faxed to ...       [facility])       [address] [address]       [place], [place]  [address]       Ph: [phone]       Fax: [phone]   RxID:   [id number] VYTORIN 10-40 MG  TABS (EZETIMIBE-SIMVASTATIN) 1 by mouth qd  #90 x 3   Entered by:   Katina Dung, RN, BSN   Authorized by:   Marca Ancona, MD   Signed by:   Katina Dung, RN, BSN on 12/13/2009   Method used:   Faxed to ...       [facility])       [address] [address]       [place], [place]  [address]       Ph: [phone]       Fax: [phone]   RxID:   [id number]

## 2010-12-15 NOTE — Progress Notes (Signed)
Summary: RX  Phone Note Call from Patient Call back at [phone]   Caller: Patient Reason for Call: Refill Medication Summary of Call: PLEASE FAX TO [facility] FOR --FOL ACID 1 MG, MOBIC 7.5 MG, Initial call taken by: Freddy Jaksch,  December 29, 2009 1:34 PM    Prescriptions: FOLIC ACID 1 MG TABS (FOLIC ACID) 1 by mouth qd  #09 x 3   Entered by:   Shonna Chock   Authorized by:   Marga Melnick MD   Signed by:   Shonna Chock on 12/29/2009   Method used:   Faxed to ...       [facility])       [address] [address]       [place], [place]  [address]       Ph: [phone]       Fax: [phone]   RxID:   [id number] MOBIC 7.5 MG TABS (MELOXICAM) one half to one tab daily as needed  #90 x 1   Entered by:   Shonna Chock   Authorized by:   Marga Melnick MD   Signed by:   Shonna Chock on 12/29/2009   Method used:   Faxed to ...       [facility])       [address] [address]       [place], [place]  [address]       Ph: [phone]       Fax: [phone]   RxID:   [id number]

## 2010-12-15 NOTE — Assessment & Plan Note (Signed)
Summary: ankle pain / flu shot/cbs   Vital Signs:  Patient profile:   65 year old male Height:      76 inches Weight:      243.8 pounds BMI:     29.78 Temp:     98.3 degrees F oral Pulse rate:   64 / minute Resp:     14 per minute BP sitting:   114 / 72  (left arm) Cuff size:   large  Vitals Entered By: Shonna Chock CMA (September 12, 2010 10:45 AM) CC: Ankle pain, Lower Extremity Joint pain   Primary Care Nixon Sparr:  Marga Melnick MD  CC:  Ankle pain and Lower Extremity Joint pain.  History of Present Illness: Lower Extremity Joint Pain      This is a 65 year old man who presents with Lower Extremity Joint pain X 1 week.  The patient reports swelling &  redness which have improved with ice & rest.He also reports giving away, popping, decreased ROM, and weakness, but denies locking.  The pain is located in the right ankle.  The pain began suddenly and with twisting.  The pain is described as sharp, intermittent, and  mainly activity related.  It initially imprved but recurred with increased walking.  Current Medications (verified): 1)  Mobic 7.5 Mg Tabs (Meloxicam) .... One Half To One Tab Daily As Needed 2)  Folic Acid 1 Mg Tabs (Folic Acid) .Marland Kitchen.. 1 By Mouth Qd 3)  Vytorin 10-40 Mg  Tabs (Ezetimibe-Simvastatin) .Marland Kitchen.. 1 By Mouth Qd 4)  Cialis 20 Mg  Tabs (Tadalafil) .... Prn 5)  Aspirin Adult Low Strength 81 Mg  Tbec (Aspirin) .Marland Kitchen.. 1 By Mouth Once Daily 6)  Hydrochlorothiazide 25 Mg Tabs (Hydrochlorothiazide) .Marland Kitchen.. 1 Tab Once Daily 7)  Fish Oil 1000 Mg Caps (Omega-3 Fatty Acids) .... Take 1 Capsule By Mouth Two Times A Day 8)  Clobetasol Propionate 0.05 % Crea (Clobetasol Propionate) .... Apply As Needed  Allergies: 1)  ! * Viagra 2)  ! Lipitor  Physical Exam  General:  well-nourished,in no acute distress; alert,appropriate and cooperative throughout examination Pulses:  R  dorsalis pedis and posterior tibial pulses are full and equal bilaterally Extremities:  trace right  pedal edema.  Tender over & inferior to  R lateral malleolus . Pain with extension & inversion  @ Lateral malleolus.  Skin:  Intact without suspicious lesions or rashes   Impression & Recommendations:  Problem # 1:  ANKLE SPRAIN, RIGHT (ICD-845.00)  His updated medication list for this problem includes:    Mobic 7.5 Mg Tabs (Meloxicam) ..... One half to one tab daily as needed    Aspirin Adult Low Strength 81 Mg Tbec (Aspirin) .Marland Kitchen... 1 by mouth once daily    Tramadol Hcl 50 Mg Tabs (Tramadol hcl) .Marland Kitchen... 1/2 -1 every 6 hrs as needed pain  Orders: T-Ankle Comp Right (73610TC)  Complete Medication List: 1)  Mobic 7.5 Mg Tabs (Meloxicam) .... One half to one tab daily as needed 2)  Folic Acid 1 Mg Tabs (Folic acid) .Marland Kitchen.. 1 by mouth qd 3)  Vytorin 10-40 Mg Tabs (Ezetimibe-simvastatin) .Marland Kitchen.. 1 by mouth qd 4)  Cialis 20 Mg Tabs (Tadalafil) .... Prn 5)  Aspirin Adult Low Strength 81 Mg Tbec (Aspirin) .Marland Kitchen.. 1 by mouth once daily 6)  Hydrochlorothiazide 25 Mg Tabs (Hydrochlorothiazide) .Marland Kitchen.. 1 tab once daily 7)  Fish Oil 1000 Mg Caps (Omega-3 fatty acids) .... Take 1 capsule by mouth two times a day 8)  Clobetasol Propionate  0.05 % Crea (Clobetasol propionate) .... Apply as needed 9)  Tramadol Hcl 50 Mg Tabs (Tramadol hcl) .... 1/2 -1 every 6 hrs as needed pain  Other Orders: Admin 1st Vaccine (16109) Flu Vaccine 36yrs + (60454)  Patient Instructions: 1)  Fill Rx if pain progresses. Prescriptions: TRAMADOL HCL 50 MG TABS (TRAMADOL HCL) 1/2 -1 every 6 hrs as needed pain  #30 x 0   Entered and Authorized by:   Marga Melnick MD   Signed by:   Marga Melnick MD on 09/12/2010   Method used:   Print then Give to Patient   RxID:   [id number]  Flu Vaccine Consent Questions     Do you have a history of severe allergic reactions to this vaccine? no    Any prior history of allergic reactions to egg and/or gelatin? no    Do you have a sensitivity to the preservative Thimersol? no    Do you have  a past history of Guillan-Barre Syndrome? no    Do you currently have an acute febrile illness? no    Have you ever had a severe reaction to latex? no    Vaccine information given and explained to patient? yes    Are you currently pregnant? no    Lot Number:[id number]   Exp Date:05/13/2011   Site Given  Left Deltoid IM  Orders Added: 1)  Admin 1st Vaccine [90471] 2)  Flu Vaccine 46yrs + [90658] 3)  Est. Patient Level III [30865] 4)  T-Ankle Comp Right [73610TC]   .lbflu

## 2011-01-09 ENCOUNTER — Telehealth: Payer: Self-pay | Admitting: Internal Medicine

## 2011-01-12 ENCOUNTER — Encounter: Payer: Self-pay | Admitting: Internal Medicine

## 2011-01-12 ENCOUNTER — Other Ambulatory Visit: Payer: Self-pay | Admitting: Internal Medicine

## 2011-01-12 ENCOUNTER — Ambulatory Visit (INDEPENDENT_AMBULATORY_CARE_PROVIDER_SITE_OTHER): Payer: BC Managed Care – PPO | Admitting: Internal Medicine

## 2011-01-12 DIAGNOSIS — R5381 Other malaise: Secondary | ICD-10-CM

## 2011-01-12 DIAGNOSIS — E785 Hyperlipidemia, unspecified: Secondary | ICD-10-CM

## 2011-01-12 DIAGNOSIS — I442 Atrioventricular block, complete: Secondary | ICD-10-CM | POA: Insufficient documentation

## 2011-01-12 DIAGNOSIS — I10 Essential (primary) hypertension: Secondary | ICD-10-CM | POA: Insufficient documentation

## 2011-01-12 DIAGNOSIS — I498 Other specified cardiac arrhythmias: Secondary | ICD-10-CM

## 2011-01-12 DIAGNOSIS — R5383 Other fatigue: Secondary | ICD-10-CM | POA: Insufficient documentation

## 2011-01-12 LAB — TSH: TSH: 1.19 u[IU]/mL (ref 0.35–5.50)

## 2011-01-12 LAB — CBC WITH DIFFERENTIAL/PLATELET
Basophils Absolute: 0 10*3/uL (ref 0.0–0.1)
Eosinophils Absolute: 0.1 10*3/uL (ref 0.0–0.7)
Lymphocytes Relative: 31.7 % (ref 12.0–46.0)
MCHC: 34.4 g/dL (ref 30.0–36.0)
Neutro Abs: 3.1 10*3/uL (ref 1.4–7.7)
Neutrophils Relative %: 56.7 % (ref 43.0–77.0)
RDW: 13.9 % (ref 11.5–14.6)

## 2011-01-12 LAB — HEPATIC FUNCTION PANEL
ALT: 26 U/L (ref 0–53)
AST: 32 U/L (ref 0–37)
Bilirubin, Direct: 0.2 mg/dL (ref 0.0–0.3)
Total Protein: 6.8 g/dL (ref 6.0–8.3)

## 2011-01-12 LAB — LIPID PANEL
Cholesterol: 182 mg/dL (ref 0–200)
VLDL: 39.4 mg/dL (ref 0.0–40.0)

## 2011-01-12 LAB — BASIC METABOLIC PANEL
Chloride: 100 mEq/L (ref 96–112)
Creatinine, Ser: 1.1 mg/dL (ref 0.4–1.5)
Potassium: 4.2 mEq/L (ref 3.5–5.1)

## 2011-01-13 ENCOUNTER — Observation Stay (HOSPITAL_COMMUNITY)
Admission: EM | Admit: 2011-01-13 | Discharge: 2011-01-14 | DRG: 116 | Disposition: A | Discharge status: Home / Self Care | Payer: BC Managed Care – PPO | Attending: Internal Medicine | Admitting: Internal Medicine

## 2011-01-13 ENCOUNTER — Ambulatory Visit (INDEPENDENT_AMBULATORY_CARE_PROVIDER_SITE_OTHER): Payer: BC Managed Care – PPO | Admitting: Cardiology

## 2011-01-13 ENCOUNTER — Ambulatory Visit: Payer: Self-pay | Admitting: Internal Medicine

## 2011-01-13 ENCOUNTER — Encounter: Payer: Self-pay | Admitting: Cardiology

## 2011-01-13 DIAGNOSIS — Z87891 Personal history of nicotine dependence: Secondary | ICD-10-CM | POA: Insufficient documentation

## 2011-01-13 DIAGNOSIS — I442 Atrioventricular block, complete: Principal | ICD-10-CM | POA: Insufficient documentation

## 2011-01-13 DIAGNOSIS — E785 Hyperlipidemia, unspecified: Secondary | ICD-10-CM | POA: Insufficient documentation

## 2011-01-13 DIAGNOSIS — Z01812 Encounter for preprocedural laboratory examination: Secondary | ICD-10-CM | POA: Insufficient documentation

## 2011-01-13 DIAGNOSIS — I10 Essential (primary) hypertension: Secondary | ICD-10-CM | POA: Insufficient documentation

## 2011-01-13 DIAGNOSIS — Z01810 Encounter for preprocedural cardiovascular examination: Secondary | ICD-10-CM | POA: Insufficient documentation

## 2011-01-13 DIAGNOSIS — E669 Obesity, unspecified: Secondary | ICD-10-CM | POA: Insufficient documentation

## 2011-01-13 DIAGNOSIS — Z01818 Encounter for other preprocedural examination: Secondary | ICD-10-CM | POA: Insufficient documentation

## 2011-01-13 DIAGNOSIS — I2510 Atherosclerotic heart disease of native coronary artery without angina pectoris: Secondary | ICD-10-CM | POA: Insufficient documentation

## 2011-01-13 DIAGNOSIS — I498 Other specified cardiac arrhythmias: Secondary | ICD-10-CM | POA: Insufficient documentation

## 2011-01-13 HISTORY — PX: PACEMAKER INSERTION: SHX728

## 2011-01-13 LAB — APTT: aPTT: 30 seconds (ref 24–37)

## 2011-01-13 LAB — COMPREHENSIVE METABOLIC PANEL
AST: 47 U/L — ABNORMAL HIGH (ref 0–37)
Albumin: 4.3 g/dL (ref 3.5–5.2)
Calcium: 9.7 mg/dL (ref 8.4–10.5)
Chloride: 105 mEq/L (ref 96–112)
Creatinine, Ser: 1.13 mg/dL (ref 0.4–1.5)
GFR calc Af Amer: >60 mL/min (ref >60)
Sodium: 138 mEq/L (ref 135–145)
Total Bilirubin: 1.7 mg/dL — ABNORMAL HIGH (ref 0.3–1.2)

## 2011-01-13 LAB — CBC
HCT: 43 % (ref 39.0–52.0)
Hemoglobin: 15.3 g/dL (ref 13.0–17.0)
MCV: 93.3 fL (ref 78.0–100.0)
Platelets: 238 10*3/uL (ref 150–400)
RBC: 4.61 MIL/uL (ref 4.22–5.81)
WBC: 6.3 10*3/uL (ref 4.0–10.5)

## 2011-01-13 LAB — DIFFERENTIAL
Eosinophils Absolute: 0.1 10*3/uL (ref 0.0–0.7)
Lymphocytes Relative: 32 % (ref 12–46)
Lymphs Abs: 2 10*3/uL (ref 0.7–4.0)
Neutro Abs: 3.6 10*3/uL (ref 1.7–7.7)
Neutrophils Relative %: 57 % (ref 43–77)

## 2011-01-14 ENCOUNTER — Observation Stay (HOSPITAL_COMMUNITY): Payer: BC Managed Care – PPO

## 2011-01-17 ENCOUNTER — Encounter: Payer: Self-pay | Admitting: Internal Medicine

## 2011-01-19 NOTE — Progress Notes (Signed)
Summary: Increased BP  Phone Note Call from Patient Call back at Work Phone [phone]   Summary of Call: Patient called noting that he was feeling dizzy so he checked his BP and it was 180/79. Patient would like to know if he should increase his meds. He notes that this has happended for the past 2-3 days. Patient was last seen in October and I made him aware this may require ov. Please advise.  Initial call taken by: Lucious Groves CMA,  January 09, 2011 2:20 PM  Follow-up for Phone Call        see Rx; needs OV with BP readings on Ramipril Follow-up by: Marga Melnick MD,  January 09, 2011 2:53 PM  Additional Follow-up for Phone Call Additional follow up Details #1::        Patient notified of the above.  Additional Follow-up by: Lucious Groves CMA,  January 09, 2011 3:06 PM    New/Updated Medications: RAMIPRIL 5 MG CAPS (RAMIPRIL) 1 once daily Prescriptions: RAMIPRIL 5 MG CAPS (RAMIPRIL) 1 once daily  #30 x 0   Entered by:   Lucious Groves CMA   Authorized by:   Marga Melnick MD   Signed by:   Lucious Groves CMA on 01/09/2011   Method used:   Electronically to        [facility] [phone]* (retail)       [address]       [place], [place]  [address]       Ph: [phone] or [phone]       Fax: [phone]   RxID:   [id number]

## 2011-01-19 NOTE — Assessment & Plan Note (Signed)
Summary: F6M/FROM CHECKOUT 07/21/10 PER PT CALL/AMD   Visit Type:  Follow-up Referring Provider:  Marga Melnick Primary Provider:  Marga Melnick MD  CC:  sob  pt saw Dr Alwyn Ren yesterdat.  History of Present Illness: 65 yo with history of AVNRT as well as conduction system disease (2nd degree AV block while taking beta blocker and calcium channel blocker in the past) presents for followup.  Over the last 10 days or so, he has felt weak and fatigued.  He has been lightheaded with standing.  His blood pressure has been "all over the place."  Sometimes up to systolic in the 170s and sometimes systolic in the 100s.  He tried to go out for a walk this week but was too fatigued to go more than about a block.  2 weeks ago he could walk 2-3 miles with no problems. He went to see Dr. Alwyn Ren yesterday and was noted to be in heart block.  Today, ECG shows 3rd degree heart block with a wide complex ventricular escape rhythm at rate of 43 bpm.  BP is 118/66.  He is somewhat lightheaded with standing.  No chest pain.   Labs (2/10): LDL 93, HDL 47, creatinine 1.1 Labs (9/10): LDL 87, HDL 49 Labs (1/11): TSH normal, K 4.1, creatinine 1.04 Labs (4/11): LDL 87, HDL 48, LFTs normal Labs (0/45): K 4.2, creatinine 1.1, TSH normal, LDL 93, HDL 49  ECG: sinus rhythm with complete heart block and ventricular escape rhythm (RBBB pattern) at a rate of 43.    Current Medications (verified): 1)  Mobic 7.5 Mg Tabs (Meloxicam) .... One Half To One Tab Daily As Needed 2)  Folic Acid 1 Mg Tabs (Folic Acid) .Marland Kitchen.. 1 By Mouth Qd 3)  Vytorin 10-40 Mg  Tabs (Ezetimibe-Simvastatin) .Marland Kitchen.. 1 By Mouth Qd 4)  Cialis 20 Mg  Tabs (Tadalafil) .... Prn 5)  Aspirin Adult Low Strength 81 Mg  Tbec (Aspirin) .Marland Kitchen.. 1 By Mouth Once Daily 6)  Hydrochlorothiazide 25 Mg Tabs (Hydrochlorothiazide) .Marland Kitchen.. 1 Tab Once Daily 7)  Fish Oil 1000 Mg Caps (Omega-3 Fatty Acids) .... Take 1 Capsule By Mouth Two Times A Day 8)  Clobetasol Propionate 0.05 % Crea  (Clobetasol Propionate) .... Apply As Needed 9)  Tramadol Hcl 50 Mg Tabs (Tramadol Hcl) .... 1/2 -1 Every 6 Hrs As Needed Pain 10)  Ramipril 5 Mg Caps (Ramipril) .Marland Kitchen.. 1 Once Daily  Allergies (verified): 1)  ! * Viagra 2)  ! Lipitor  Past History:  Past Medical History: 1. Hyperlipidemia. 2. Nonobstructive coronary artery disease.  The patient had a left heart catheterization in August 2009 showing a 30% mid circumflex lesion, otherwise coronaries were clean angiographically. 3. Hypertension. 4. Tachy-brady syndrome.  The patient has had episodes of both SVT and second-degree AV block with symptomatic bradycardia.  The patient's SVT is felt to be most likely AVNRT.  In the setting of being on diltiazem and metoprolol to suppress his SVT, he developed second-degree AV block.  Holter monitor showed 2:1 AV block as well as episodes that may have been type II second-degree AV block.  The patient was taken off his calcium-channel blocker and his beta-blocker with resolution of heart block.  Holter monitor was done 1/11 showing PACs and one pause of 3.9 seconds resumably while patient was sleeping with heart block (5 p waves without QRSs).  HR range 59-119.  Event monitor (2/11): only NSR with 1st degree AV block.  Complete heart block 3/12.  5. Past history of  tobacco abuse. 6. Echo done in August 2009 showing EF 50-55% with no regional wall motion abnormalities, paradoxical septal motion, mild diastolic dysfunction, mild left atrial enlargement, normal RV size and function.  7. Probable OSA: Patient refuses sleep study and says that he would not use CPAP.  This may be the cause of his nocturnal arrhythmias.    Family History: Reviewed history from 03/03/2008 and no changes required. Family History Diabetes 1st degree relative Family History MI Family History PVD  heart disease:  maternal grandfather  Social History: Reviewed history from 07/20/2009 and no changes required. Former  Smoker Alcohol use-yes pt is married. pt does not have any children.  exercise- walk 3 miles 4 days a week at a minimum with no CV symptoms.   Review of Systems       All systems reviewed and negative except as per HPI.   Vital Signs:  Patient profile:   65 year old male Height:      76 inches Weight:      243 pounds BMI:     29.69 Pulse rate:   42 / minute BP sitting:   118 / 66  (left arm) Cuff size:   large  Vitals Entered By: Laurance Flatten CMA (January 13, 2011 12:08 PM)  Physical Exam  General:  Well developed, well nourished, in no acute distress. Neck:  Neck supple, no JVD. No masses, thyromegaly or abnormal cervical nodes. Lungs:  Clear bilaterally to auscultation and percussion. Heart:  Non-displaced PMI, chest non-tender; bradycardic, regular rate and rhythm, S1, S2 without murmurs, rubs or gallops. Carotid upstroke normal, no bruit.  Pedals normal pulses. No edema, no varicosities. Abdomen:  Bowel sounds positive; abdomen soft and non-tender without masses, organomegaly, or hernias noted. No hepatosplenomegaly. Extremities:  No clubbing or cyanosis. Neurologic:  Alert and oriented x 3. Psych:  Normal affect.   Impression & Recommendations:  Problem # 1:  BRADYCARDIA (ICD-427.89) Patient has symptomatic 3rd degree heart block with a wide complex ventricular escape, rate in the 40s.  BP is reasonably preserved.  I have contacted Dr. [name] in the hospital.  I will send Mr. Oleson to the ER, and he will get a dual chamber pacemaker later this afternoon.   Problem # 2:  HYPERTENSION (ICD-401.9) Continue same meds after pacemaker placed.   Problem # 3:  HYPERLIPIDEMIA (ICD-272.4) Reasonable lipid control.

## 2011-01-19 NOTE — Assessment & Plan Note (Signed)
Summary: concerns about BP/cdj   Vital Signs:  Patient profile:   65 year old male Weight:      241.6 pounds BMI:     29.51 Temp:     98.1 degrees F oral Pulse rate:   52 / minute Resp:     14 per minute BP sitting:   100 / 62  (left arm) Cuff size:   large  Vitals Entered By: Shonna Chock CMA (January 12, 2011 10:21 AM) CC: B/P concerns (b/p running high x several days), Headaches   Primary Care Provider:  Marga Melnick MD  CC:  B/P concerns (b/p running high x several days) and Headaches.  History of Present Illness:    BPs have been elevated with his cuff & @ drug store; range 139/79- 188/119. In past  couple of weeks he reports lightheadedness, urinary frequency, headaches, edema, and fatigue.  Associated symptoms include dyspnea and palpitations.  The patient denies the following associated symptoms: chest pain and syncope.  The patient reports that dietary compliance has been good.  The patient reports no exercise.  Adjunctive measures currently used by the patient include salt restriction except for pork rinds & sausage bisquits  recently.. Similar symptoms occurred 2 years ago with low BP while on Beta blockers.      He  denies nausea, vomiting, sweats, tearing of eyes, nasal congestion, photophobia, and phonophobia.  The headache is described as intermittent and sharp.  The location of the pain is bitemporal.  High-risk features (red flags) include pain worse with exertion.  The patient denies the following high-risk features: fever, neck pain/stiffness, vision loss or change, and focal weakness.  The headaches  have no trigger & respond to Goodies.  Current Medications (verified): 1)  Mobic 7.5 Mg Tabs (Meloxicam) .... One Half To One Tab Daily As Needed 2)  Folic Acid 1 Mg Tabs (Folic Acid) .Marland Kitchen.. 1 By Mouth Qd 3)  Vytorin 10-40 Mg  Tabs (Ezetimibe-Simvastatin) .Marland Kitchen.. 1 By Mouth Qd 4)  Cialis 20 Mg  Tabs (Tadalafil) .... Prn 5)  Aspirin Adult Low Strength 81 Mg  Tbec (Aspirin)  .Marland Kitchen.. 1 By Mouth Once Daily 6)  Hydrochlorothiazide 25 Mg Tabs (Hydrochlorothiazide) .Marland Kitchen.. 1 Tab Once Daily 7)  Fish Oil 1000 Mg Caps (Omega-3 Fatty Acids) .... Take 1 Capsule By Mouth Two Times A Day 8)  Clobetasol Propionate 0.05 % Crea (Clobetasol Propionate) .... Apply As Needed 9)  Tramadol Hcl 50 Mg Tabs (Tramadol Hcl) .... 1/2 -1 Every 6 Hrs As Needed Pain 10)  Ramipril 5 Mg Caps (Ramipril) .Marland Kitchen.. 1 Once Daily  Allergies: 1)  ! * Viagra 2)  ! Lipitor  Physical Exam  General:  well-nourished,in no acute distress; alert,appropriate and cooperative throughout examination Neck:  No deformities, masses, or tenderness noted. Lungs:  Normal respiratory effort, chest expands symmetrically. Lungs are clear to auscultation, no crackles or wheezes. Heart:  bradycardia and irregular rhythm.   Repeat BP : 130/65  L; 130/70  R Abdomen:  Bowel sounds positive,abdomen soft and non-tender without masses, organomegaly or hernias noted. No AAA or bruits Pulses:  R and L carotid,radial,dorsalis pedis and posterior tibial pulses are full and equal bilaterally Extremities:  trace left  &  R pedal edema.   Neurologic:  alert & oriented X3 and DTRs symmetrical and normal.   Skin:  Tanned Cervical Nodes:  No lymphadenopathy noted Axillary Nodes:  No palpable lymphadenopathy Psych:  memory intact for recent and remote, normally interactive, and good eye contact.  Impression & Recommendations:  Problem # 1:  HYPERTENSION (ICD-401.9)  His updated medication list for this problem includes:    Hydrochlorothiazide 25 Mg Tabs (Hydrochlorothiazide) .Marland Kitchen... 1 tab once daily    Ramipril 5 Mg Caps (Ramipril) .Marland Kitchen... 1 once daily  Orders: Venipuncture (93235) TLB-BMP (Basic Metabolic Panel-BMET) (80048-METABOL) EKG w/ Interpretation (93000)  Problem # 2:  FATIGUE (ICD-780.79)  Orders: Venipuncture (57322) TLB-CBC Platelet - w/Differential (85025-CBCD) Specimen Handling (02542)  Problem # 3:  BRADYCARDIA  (ICD-427.89) see EKG ; ? 2:1 block.Cardiology consultation  will be requested. His updated medication list for this problem includes:    Aspirin Adult Low Strength 81 Mg Tbec (Aspirin) .Marland Kitchen... 1 by mouth once daily  Orders: Venipuncture (70623) TLB-TSH (Thyroid Stimulating Hormone) (84443-TSH) EKG w/ Interpretation (93000) Specimen Handling (76283) Cardiology Referral (Cardiology)  Problem # 4:  HYPERLIPIDEMIA (ICD-272.4)  His updated medication list for this problem includes:    Vytorin 10-40 Mg Tabs (Ezetimibe-simvastatin) .Marland Kitchen... 1 by mouth qd  Orders: Venipuncture (15176) TLB-Lipid Panel (80061-LIPID) TLB-Hepatic/Liver Function Pnl (80076-HEPATIC)  Complete Medication List: 1)  Mobic 7.5 Mg Tabs (Meloxicam) .... One half to one tab daily as needed 2)  Folic Acid 1 Mg Tabs (Folic acid) .Marland Kitchen.. 1 by mouth qd 3)  Vytorin 10-40 Mg Tabs (Ezetimibe-simvastatin) .Marland Kitchen.. 1 by mouth qd 4)  Cialis 20 Mg Tabs (Tadalafil) .... Prn 5)  Aspirin Adult Low Strength 81 Mg Tbec (Aspirin) .Marland Kitchen.. 1 by mouth once daily 6)  Hydrochlorothiazide 25 Mg Tabs (Hydrochlorothiazide) .Marland Kitchen.. 1 tab once daily 7)  Fish Oil 1000 Mg Caps (Omega-3 fatty acids) .... Take 1 capsule by mouth two times a day 8)  Clobetasol Propionate 0.05 % Crea (Clobetasol propionate) .... Apply as needed 9)  Tramadol Hcl 50 Mg Tabs (Tramadol hcl) .... 1/2 -1 every 6 hrs as needed pain 10)  Ramipril 5 Mg Caps (Ramipril) .Marland Kitchen.. 1 once daily  Patient Instructions: 1)  Limit your Sodium (Salt) to less than 4 grams a day (slightly less than 1 teaspoon) to prevent fluid retention, swelling, or worsening or symptoms. 2)  Check your Blood Pressure regularly. your goal = 135/85 ON AVERAGE .   Orders Added: 1)  Venipuncture [36415] 2)  TLB-CBC Platelet - w/Differential [85025-CBCD] 3)  TLB-BMP (Basic Metabolic Panel-BMET) [80048-METABOL] 4)  TLB-TSH (Thyroid Stimulating Hormone) [84443-TSH] 5)  TLB-Lipid Panel [80061-LIPID] 6)  TLB-Hepatic/Liver  Function Pnl [80076-HEPATIC] 7)  Est. Patient Level IV [16073] 8)  EKG w/ Interpretation [93000] 9)  Specimen Handling [99000] 10)  Cardiology Referral [Cardiology]

## 2011-01-20 ENCOUNTER — Ambulatory Visit: Payer: Self-pay | Admitting: Cardiology

## 2011-01-21 ENCOUNTER — Encounter: Payer: Self-pay | Admitting: Cardiology

## 2011-01-23 ENCOUNTER — Telehealth (INDEPENDENT_AMBULATORY_CARE_PROVIDER_SITE_OTHER): Payer: Self-pay | Admitting: *Deleted

## 2011-01-23 ENCOUNTER — Encounter: Payer: Self-pay | Admitting: Internal Medicine

## 2011-01-23 ENCOUNTER — Ambulatory Visit (INDEPENDENT_AMBULATORY_CARE_PROVIDER_SITE_OTHER): Payer: BC Managed Care – PPO

## 2011-01-23 DIAGNOSIS — Z950 Presence of cardiac pacemaker: Secondary | ICD-10-CM

## 2011-01-24 NOTE — Miscellaneous (Signed)
Summary: Device preload  Clinical Lists Changes  Observations: Added new observation of PPM INDICATN: CHB (01/17/2011 7:24) Added new observation of MAGNET RTE: BOL 85 ERI 65 (01/17/2011 7:24) Added new observation of PPMLEADSTAT2: active (01/17/2011 7:24) Added new observation of PPMLEADSER2: [id number] (01/17/2011 7:24) Added new observation of PPMLEADMOD2: 5076  (01/17/2011 7:24) Added new observation of PPMLEADLOC2: RV  (01/17/2011 7:24) Added new observation of PPMLEADSTAT1: active  (01/17/2011 7:24) Added new observation of PPMLEADSER1: [id number]  (01/17/2011 7:24) Added new observation of PPMLEADMOD1: 5076  (01/17/2011 7:24) Added new observation of PPMLEADLOC1: RA  (01/17/2011 7:24) Added new observation of PPM IMP MD: Hillis Range, MD  (01/17/2011 7:24) Added new observation of PPMLEADDOI2: 01/13/2011  (01/17/2011 7:24) Added new observation of PPMLEADDOI1: 01/13/2011  (01/17/2011 7:24) Added new observation of PPM DOI: 01/13/2011  (01/17/2011 7:24) Added new observation of PPM SERL#: [id number]  (01/17/2011 7:24) Added new observation of PPM MODL#: ADDRL1  (01/17/2011 6:57) Added new observation of PACEMAKERMFG: Medtronic  (01/17/2011 7:24) Added new observation of PACEMAKER MD: Hillis Range, MD  (01/17/2011 7:24)      PPM Specifications Following MD:  Hillis Range, MD     PPM Vendor:  Medtronic     PPM Model Number:  ADDRL1     PPM Serial Number:  [id number] PPM DOI:  01/13/2011     PPM Implanting MD:  Hillis Range, MD  Lead 1    Location: RA     DOI: 01/13/2011     Model #: [id number]     Serial #: [id number]     Status: active Lead 2    Location: RV     DOI: 01/13/2011     Model #: 3664     Serial #: [id number]     Status: active  Magnet Response Rate:  BOL 85 ERI 65  Indications:  CHB

## 2011-01-27 NOTE — Op Note (Signed)
  NAME:  [name], [name]               ACCOUNT NO.:  [id number]  MEDICAL RECORD NO.:  [id number]           PATIENT TYPE:  I  LOCATION:  2001                         FACILITY:  [facility]  PHYSICIAN:  Hillis Range, MD       DATE OF BIRTH:  06-17-1946  DATE OF PROCEDURE:  01/13/2011 DATE OF DISCHARGE:                              OPERATIVE REPORT   SURGEON:  Hillis Range, MD  PREPROCEDURE DIAGNOSIS:  Complete heart block.  POSTPROCEDURE DIAGNOSIS:  Complete heart block.  PROCEDURE:  Pacemaker implantation.  INTRODUCTION:  [name] is a pleasant 65 year old gentleman with symptomatic complete heart block who presents today for pacemaker implantation.  He has had no identifiable reversible causes for complete heart block.  He is presently on no AV nodal blocking agents.  He reports symptoms of fatigue and presyncope.  He, therefore, presents today for pacemaker implantation.  DESCRIPTION OF PROCEDURE:  Informed written consent was obtained and the patient was brought to the electrophysiology lab in the fasting state. He was adequately sedated with intravenous Versed and fentanyl as outlined in the nursing report.  The patient's left chest was prepped and draped in the usual sterile fashion by the EP lab staff.  The skin overlying the left deltopectoral region was infiltrated with lidocaine for local analgesia.  A 3-cm incision was made over the left deltopectoral region.  A left subcutaneous pacemaker pocket was fashioned using a combination of sharp and blunt dissection. Electrocautery was required to assure hemostasis.  The left axillary vein was cannulated with fluoroscopic visualization.  No contrast was required for this endeavor.  Through the left axillary vein, a Medtronic model [id number] (serial [id number]) right atrial lead and a Medtronic model [id number] (serial [id number]).  Right ventricular lead were advanced with fluoroscopic visualization into the right atrial  appendage and right ventricular apex positions respectively.  Initial atrial lead P-waves measured 2.1 mV with an impedance of 769 ohms and a threshold of 1.5 volts at 0.5 msec.  Right ventricular lead R-waves measured 7.5 mV with impedance of 909 ohms and a threshold of 0.8 volts at 0.5 msec. Both leads were secured to the pectoralis fascia using #2 silk suture over the suture sleeves.  The leads were then connected to a Medtronic Adapta L model ADDRL1 (serial [id number]) pacemaker.  The pocket was irrigated with copious gentamicin solution.  The pacemaker was then placed into the pocket.  The pocket was then closed in 2 layers with 2.0 Vicryl suture for the subcutaneous and subcuticular layers.  Steri- Strips and a sterile dressing were then applied.  There were no early apparent complications.  No contrast was required for the procedure today.  CONCLUSIONS: 1. Successful implantation of a Medtronic Adapta L model ADDRL1     pacemaker for complete heart block. 2. No early apparent complications.     Hillis Range, MD     JA/MEDQ  D:  01/13/2011  T:  01/14/2011  Job:  [id number]  cc:   Titus Dubin. Alwyn Ren, MD,FACP,FCCP Marca Ancona, MD  Electronically Signed by Hillis Range MD on 01/27/2011 10:53:31 PM

## 2011-01-27 NOTE — Discharge Summary (Signed)
NAMECASSANDRA, [name]               ACCOUNT NO.:  [id number]  MEDICAL RECORD NO.:  [id number]           PATIENT TYPE:  I  LOCATION:  2001                         FACILITY:  [facility]  PHYSICIAN:  Jonathan Range, MD       DATE OF BIRTH:  09-04-1946  DATE OF ADMISSION:  01/13/2011 DATE OF DISCHARGE:  01/14/2011                              DISCHARGE SUMMARY   PRIMARY CARDIOLOGIST:  Jonathan Ancona, MD  ELECTROPHYSIOLOGIST:  Jonathan Range, MD  PRIMARY CARE PROVIDER:  Titus Dubin. Alwyn Ren, MD, FACP, [facility]  DISCHARGE DIAGNOSIS:  Complete heart block, status post Medtronic pacemaker implantation on January 13, 2011.  SECONDARY DIAGNOSES: 1. Prior Mobitz type 2 second-degree arteriovenous block, resolution     after discontinuation of diltiazem and metoprolol. 2. Nonobstructive coronary artery disease. 3. Preserved ejection fraction. 4. Hypertension. 5. Hyperlipidemia. 6. Supraventricular tachycardia. 7. History of tobacco abuse.8. Obesity.  ALLERGIES:  VIAGRA and ATORVASTATIN.  REASON FOR HOSPITALIZATION:  This is a 65 year old gentleman with the above-stated problem list who presented to Dr. Alford Mccarty office on January 13, 2011, with complaints of 10 days of feeling weak and fatigue as well as lightheadedness with standing.  He had decreased exercise tolerance from approximately 2-3 miles to down to inability to walk more than a block.  He was initially evaluated by Dr. Alwyn Mccarty who noted the patient to be in heart block.  EKG at Dr. Alford Mccarty office showed third-degree heart block with a wide complex ventricular escape rhythm at a rate of 43 beats per minute.  The patient's pressures were stable, but again he was lightheaded with standing.  He denied any chest pain.  With the patient's symptomatic third-degree heart block with a wide complex ventricular escape, the patient met criteria for pacemaker implantation. The patient was sent to [facility] for this procedure.  HOSPITAL COURSE:  Dr.  Johney Mccarty evaluated the patient upon presentation to [facility].  Again, the patient met criteria for pacemaker implantation with his symptomatic complete heart block.  Risks, benefits, and alternatives of pacemaker implantation were discussed with the patient and his spouse.  They agree to proceed.  It was felt that implantation was rather urgent, therefore he was scheduled for the afternoon.  [name] brought the patient to the EP Lab.  Informed consent was obtained.  As above, [name] completed successful implantation of a Medtronic Adapta L model ADDDRL1 pacemaker for the patient's complete heart block without identifiable reversible causes.  The patient tolerated this well and was taken for overnight observation.  On the day of discharge, the patient's pacemaker was interrogated.  There were no revisions needed at that time.  It was operating appropriately.  Postop chest x-ray showed no evidence of pneumothorax and leads were stable. EKG showed AV-paced rhythm.  The patient's pacemaker pocket was without signs of hematoma.  The patient was ambulating without difficulty.  [name] evaluated the patient and noted him stable for home.  He will receive his last dose of antibiotics and be discharged in stable condition.  DISCHARGE VITAL SIGNS:  Blood pressure 129/76, temperature 97.7, pulse 60, respirations 16, and O2 saturation 96%  on room air.  DISCHARGE MEDICATIONS: 1. Aspirin 81 mg daily. 2. Cialis 20 mg daily as needed for erectile dysfunction. 3. Folic acid 1 mg daily. 4. Hydrochlorothiazide 25 mg daily. 5. Omega-3-acid ethyl esters 1 g 1 capsule twice daily. 6. Ramipril 5 mg daily. 7. Vytorin 1 tablet daily.  DISCHARGE PLANS AND INSTRUCTIONS: 1. The patient will follow up in the Device Clinic in 10 days, the     office will call to schedule this appointment. 2. The patient will follow up with [name] in 3 months, the office     will call to schedule this  appointment. 3. The patient is not to have any heavy lifting or vigorous activity     for 6-8 weeks.  A instruction guide on raising his arm above his     head over the next week has been given to the patient.  He is not     to drive for 1 week.  He is not to shower for 1 week. 4. The patient is to call office for any drainage or discharge from     the wound or any swelling and bruising or If he has any further     concerns. 5. The patient is to continue a low-sodium, heart-healthy diet. 6. The patient is to avoid any activity that causes chest pain or     shortness of breath.  DURATION OF DISCHARGE: Greater than 30 minutes with physician and  physician extender time.    Jonathan Monarch, PA-C   ______________________________ Jonathan Range, MD    NB/MEDQ  D:  01/14/2011  T:  01/15/2011  Job:  [id number]  cc:   Jonathan Ancona, MD Jonathan Dubin. Alwyn Ren, MD,FACP,FCCP  Electronically Signed by Alen Blew P.A. on 01/19/2011 01:42:14 PM Electronically Signed by Jonathan Range MD on 01/27/2011 10:53:26 PM

## 2011-01-27 NOTE — Consult Note (Signed)
NAME:  [name], [name]               ACCOUNT NO.:  [id number]  MEDICAL RECORD NO.:  [id number]           Mccarty TYPE:  E  LOCATION:  [facility]                         FACILITY:  [facility]  PHYSICIAN:  Hillis Range, MD       DATE OF BIRTH:  [birth year]  DATE OF CONSULTATION: DATE OF DISCHARGE:                                CONSULTATION   REQUESTING PHYSICIAN:  Marca Ancona, MD  REASON FOR CONSULTATION:  Heart block.  HISTORY OF PRESENT ILLNESS:  [name] is a pleasant 65 year old gentleman with a history of nonobstructive coronary artery disease and preserved ejection fraction, who presented for further evaluation and management of dizziness and fatigue.  He reports that over Jonathan past week he has had progressive fatigue.  He also reports dizziness and multiple episodes of presyncope.  He denies syncope.  He has not had chest pain, shortness of breath, palpitations, orthopnea or edema.  He reports having a headache with "floaters ".  He was evaluated by Dr. Jonny Ruiz and had an EKG obtained, which revealed complete heart block.  He was therefore referred to Dr. Marca Ancona earlier today for further management.  Upon arrival, [name] was found to have complete heart block with a right bundle-branch left anterior fascicular block escape rhythm.  He has not been treated with any AV nodal agents recently and his thyroid was checked yesterday and was normal.  [name] has previously had difficulties with Mobitz II second-degree AV block observed on a Holter monitor in 2011.  He has a prior history of supraventricular tachycardia and at that time had been treated with AV nodal blocking agents, including both diltiazem and metoprolol.  At that time, these medications were discontinued.  [name] apparently had initial resolution of his Mobitz II AV block and did well for approximately a year.  He now returns with symptomatic complete heart block.  PAST MEDICAL HISTORY: 1. Prior  Mobitz II second-degree AV block as above. 2. Nonobstructive coronary artery disease. 3. Preserved ejection fraction. 4. Hypertension. 5. Hyperlipidemia. 6. Supraventricular tachycardia. 7. History of tobacco use. 8. Obesity.  MEDICATIONS:  Reviewed in [facility].  ALLERGIES:  VIAGRA AND LIPITOR.  SOCIAL HISTORY:  [name] lives in [place] with his spouse.  He smokes.  He denies.  He is a former smoker.  He has occasional alcohol consumption.  FAMILY HISTORY:  Notable for coronary artery disease.  REVIEW OF SYSTEMS:  All systems reviewed and negative except as outlined in Jonathan HPI above.  PHYSICAL EXAMINATION:  VITAL SIGNS:  He is afebrile.  Heart rate 42, respirations 18, blood pressure 118/66, sats 98%. GENERAL:  [name] is a well-appearing male in no acute distress.  He is alert and oriented x3. HEENT:  Normocephalic, atraumatic.  Sclerae clear.  Conjunctivae pink. Oropharynx clear. NECK:  Supple.  No thyromegaly, JVD or bruits. LUNGS:  Clear to auscultation bilaterally. HEART:  Bradycardic, irregular rhythm.  No murmurs, rubs or gallops. GI:  Soft, nontender, nondistended.  Positive bowel sounds. EXTREMITIES:  No clubbing, cyanosis or edema. SKIN:  No ecchymoses or lacerations. MUSCULOSKELETAL:  No deformity or atrophy. PSYCHIATRIC:  Euthymic mood.  Full affect.  EKG from today reveals sinus rhythm with complete heart block and a right bundle-branch left anterior fascicular escape rhythm in Jonathan 40s.  Labs are reviewed.  Prior echocardiogram is reviewed.  IMPRESSION:  [name] is a pleasant 65 year old gentleman with a history of symptomatic complete heart block, who now presents for further management.  He meets criteria for pacemaker implantation at this time.  I think that urgent pacemaker implantation is quite reasonable.  I had a discussion with Dr. Shirlee Latch who agrees.  PLAN:  Risks, benefits and alternatives to pacemaker implantation were discussed  at length with [name] and his spouse.  He understands that Jonathan risk include, but are not limited to infection, bleeding, vascular damage, perforation, tamponade, pneumothorax, lead dislodgement, renal failure, MI and death.  [name] accepts these risks and wishes to proceed.  We will therefore proceed with pacemaker implantation at Jonathan next available time.     Hillis Range, MD     JA/MEDQ  D:  01/13/2011  T:  01/14/2011  Job:  [id number]  cc:   Dr. Sherre Lain, MD  Electronically Signed by Hillis Range MD on 01/27/2011 10:53:29 PM

## 2011-01-31 NOTE — Progress Notes (Signed)
Summary: med causing cough  Phone Note Call from Patient Call back at Home Phone [phone]   Caller: Patient Summary of Call: Pt called says that Ramipril is causing dry hacking cough and would like to change medications. Initial call taken by: Doristine Devoid CMA,  January 23, 2011 2:24 PM  Follow-up for Phone Call        change to  losartan 50 mg once daily # 90, RX1 Follow-up by: Marga Melnick MD,  January 23, 2011 4:49 PM  Additional Follow-up for Phone Call Additional follow up Details #1::        spoke w/ patient aware of change in prescription .Marland KitchenMarland KitchenMarland KitchenDoristine Devoid CMA  January 23, 2011 4:57 PM     New/Updated Medications: LOSARTAN POTASSIUM 50 MG TABS (LOSARTAN POTASSIUM) take one tablet daily Prescriptions: LOSARTAN POTASSIUM 50 MG TABS (LOSARTAN POTASSIUM) take one tablet daily  #30 x 2   Entered by:   Doristine Devoid CMA   Authorized by:   Marga Melnick MD   Signed by:   Doristine Devoid CMA on 01/23/2011   Method used:   Electronically to        [facility] [phone]* (retail)       [address]       [place], [place]  [address]       Ph: [phone] or [phone]       Fax: [phone]   RxID:   [id number]

## 2011-01-31 NOTE — Procedures (Signed)
Summary: 10 day wound ck/mt   Current Medications (verified): 1)  Mobic 7.5 Mg Tabs (Meloxicam) .... One Half To One Tab Daily As Needed 2)  Folic Acid 1 Mg Tabs (Folic Acid) .Marland Kitchen.. 1 By Mouth Qd 3)  Vytorin 10-40 Mg  Tabs (Ezetimibe-Simvastatin) .Marland Kitchen.. 1 By Mouth Qd 4)  Cialis 20 Mg  Tabs (Tadalafil) .... Prn 5)  Aspirin Adult Low Strength 81 Mg  Tbec (Aspirin) .Marland Kitchen.. 1 By Mouth Once Daily 6)  Hydrochlorothiazide 25 Mg Tabs (Hydrochlorothiazide) .Marland Kitchen.. 1 Tab Once Daily 7)  Fish Oil 1000 Mg Caps (Omega-3 Fatty Acids) .... Take 1 Capsule By Mouth Two Times A Day 8)  Clobetasol Propionate 0.05 % Crea (Clobetasol Propionate) .... Apply As Needed 9)  Tramadol Hcl 50 Mg Tabs (Tramadol Hcl) .... 1/2 -1 Every 6 Hrs As Needed Pain 10)  Ramipril 5 Mg Caps (Ramipril) .Marland Kitchen.. 1 Once Daily  Allergies (verified): 1)  ! * Viagra 2)  ! Lipitor  PPM Specifications Following MD:  Hillis Range, MD     PPM Vendor:  Medtronic     PPM Model Number:  ADDRL1     PPM Serial Number:  [id number] PPM DOI:  01/13/2011     PPM Implanting MD:  Hillis Range, MD  Lead 1    Location: RA     DOI: 01/13/2011     Model #: [id number]     Serial #: [id number]     Status: active Lead 2    Location: RV     DOI: 01/13/2011     Model #: 6948     Serial #: [id number]     Status: active  Magnet Response Rate:  BOL 85 ERI 65  Indications:  CHB  Tech Comments:  meds reviewed. see paceart report. Vella Kohler  January 23, 2011 12:25 PM

## 2011-02-09 NOTE — Cardiovascular Report (Signed)
Summary: Office Visit   Office Visit   Imported By: Roderic Ovens 01/31/2011 14:12:57  _____________________________________________________________________  External Attachment:    Type:   Image     Comment:   External Document

## 2011-03-03 ENCOUNTER — Ambulatory Visit (INDEPENDENT_AMBULATORY_CARE_PROVIDER_SITE_OTHER): Payer: BC Managed Care – PPO | Admitting: Internal Medicine

## 2011-03-03 ENCOUNTER — Encounter: Payer: Self-pay | Admitting: Internal Medicine

## 2011-03-03 DIAGNOSIS — I10 Essential (primary) hypertension: Secondary | ICD-10-CM

## 2011-03-03 DIAGNOSIS — R51 Headache: Secondary | ICD-10-CM

## 2011-03-03 MED ORDER — LOSARTAN POTASSIUM 100 MG PO TABS: 100.0000 mg | ORAL_TABLET | Freq: Every day | ORAL | Status: DC

## 2011-03-03 MED ORDER — TRAMADOL HCL 50 MG PO TABS: 25.0000 mg | ORAL_TABLET | Freq: Four times a day (QID) | ORAL | Status: DC | PRN

## 2011-03-03 NOTE — Progress Notes (Signed)
Subjective:    Patient ID: [name], male    DOB: [birth year], 65 y.o.   MRN: [id number]  HPI HEADACHE   Onset: 04/15  Location: R  Occipital area Quality: sharp, throbbing Frequency: intermittently, daily  Precipitating factors: ?stress     Prior treatment: ASA, [place], Tylenol with partial response  Associated Symptoms Nausea/vomiting: no  Photophobia/phonophobia: no  Tearing of eyes: no  Sinus pain/pressure: no  Family hx migraine: no  Personal stressors: yes ; "rushing is trigger" Red Flags Fever: no  Neck pain/stiffness: no  Vision/speech/swallow/hearing difficulty: no  Focal weakness/numbness: no  Altered mental status: no  Trauma: no  New type of headache: " same as when BP was low" Anticoagulant use: no  CHRONIC HYPERTENSION  Disease Monitoring  Blood pressure range: up to 180/90  Chest pain: no   Dyspnea: no   Claudication:  no   Medication compliance: yes  Medication Side Effects  Lightheadedness: yes, with headaches   Urinary frequency: yes, w/o dysuria, pyuria, or hematuria   Edema: no     Preventitive Healthcare:  Exercise: yes, he has been  walking  Diet Pattern: no specifiic diet  Salt Restriction: yes    He denies a constellation of headache, flushing, chest pain, and diarrhea.                                                                                                                              Review of Systems     Objective:   Physical Exam Gen.: Healthy and well-nourished in appearance. Alert, appropriate and cooperative throughout exam. Head: Normocephalic without obvious abnormalities Eyes: No corneal or conjunctival inflammation noted. Pupils equal round reactive to light and accommodation. Fundal exam is benign without hemorrhages, exudate, papilledema. Extraocular motion intact. Vision grossly normal. Slight ptosis OS Ears: External  ear exam reveals no significant lesions or deformities. Canals clear .TMs normal. Hearing  is grossly normal bilaterally. Nose: External nasal exam reveals no deformity or inflammation. Nasal mucosa are pink and moist. No lesions or exudates noted. Mouth: Oral mucosa and oropharynx reveal no lesions or exudates. Teeth in good repair. Upper partial Neck: No deformities, masses, or tenderness noted. Range of motion normal. Thyroid   Without nodules. Lungs: Normal respiratory effort; chest expands symmetrically. Lungs are clear to auscultation without rales, wheezes, or increased work of breathing. Heart: Normal rate and rhythm. Normal S1 and S2. No gallop, click, or rub. No  Murmur. S4  Musculoskeletal/extremities:  No clubbing, cyanosis, edema, or deformity noted. Dupuytren's R palm.Tone & strength  normal.Joints normal. Nail health  good. Vascular: Carotid, radial artery, dorsalis pedis and dorsalis posterior tibial pulses are full and equal. No bruits present. Neuro: Cranial nerve exam revealed no deficits;sensation to light touch was normal ; deep tendon reflexes were normal; gait  normal; balance normal ; Romberg/ finger to nose  testing normal.   Skin: Intact without suspicious lesions or rashes. Lymph: No cervical, axillary, or inguinal  lymphadenopathy present. Psych: Mood and affect are normal. Normally interactive                                                                                         Assessment & Plan:  #1 hypertension, labile  #2 headache; negative neurologic exam  Plan: The losartan will be increased 100 mg daily.  #2 tramadol 50 mg will be used every 6 hours as needed  #3 he'll be asked to keep a headache diary.

## 2011-03-03 NOTE — Patient Instructions (Signed)

## 2011-03-05 ENCOUNTER — Other Ambulatory Visit: Payer: Self-pay | Admitting: Internal Medicine

## 2011-03-06 NOTE — Telephone Encounter (Signed)
I d/w patient this med was removed from his med list in March due to cough. ? Did he request refill, patient stated he did not request refill. I will send note to the pharmacy

## 2011-03-28 NOTE — Cardiovascular Report (Signed)
NAME:  Edmiston, Amor               ACCOUNT NO.:  [id number]   MEDICAL RECORD NO.:  [id number]          PATIENT TYPE:  OIB   LOCATION:  1965                         FACILITY:  [facility]   PHYSICIAN:  Verne Carrow, MDDATE OF BIRTH:  1945-12-10   DATE OF PROCEDURE:  DATE OF DISCHARGE:                            CARDIAC CATHETERIZATION   PROCEDURE:  1. Left heart catheterization.  2. Selective coronary angiography.  3. Left ventricular angiogram.   INDICATIONS:  Chest pain and both first and second degree heart block in  a patient with nonobstructive coronary artery disease noted on a  catheterization in 1999.  The patient was in the office yesterday on the  treadmill performing a nuclear stress test when he began to have chest  discomfort, and bilateral lower extremity burning.  His chest pain did  resolve when he began to rest.  Because of the chest pain during  exercise and also some T-wave inversions of his baseline EKG, he was  scheduled for a left heart catheterization today.   DETAILS OF PROCEDURE:  The patient was brought to the heart  catheterization laboratory after signing informed consent.  His right  groin was prepped and draped in the sterile fashion.  The right femoral  artery was engaged with a 4-French sheath.  A JL-5 4-French catheter was  used to inject the left coronary system.  A JR-4 diagnostic catheter was  used to inject the right coronary system.  The left ventricular  angiogram was performed with the pigtail catheter.  The patient  tolerated the procedure well and the sheath was removed here in the cath  lab.  He was taken to the recovery room in stable condition.   FINDINGS AND PROCEDURES:  1. The left main is short and gives rise to a circumflex, and a left      anterior descending coronary artery.  There is no disease in the      left main coronary artery.  2. The left anterior descending is a large vessel that gives rise to a      very high  diagonal branch and second diagonal down in the mid      portion.  There is no plaque noted in the left anterior descending.  3. The circumflex gives rise to 2 obtuse marginal branches.  There is      a 30% stenosis noted in the mid portion of the circumflex artery.  4. Right coronary artery gives rise to a large posterior descending      branch and a posterolateral branch.  There is no disease in the      right coronary artery.  5. Left ventricular gram showed apical hypokinesis with an overall      preserved ejection fraction of 50%.   IMPRESSION:  1. Isolated coronary artery plaque disease.  2. Mild segmental wall motion abnormality with overall preserved      ejection fraction.   RECOMMENDATIONS:  1. Medical management of plaque coronary artery disease.  2. The patient will follow up with Dr. Shirlee Latch in the [facility],      [facility] cardiology  office in 2 to 3 weeks.      Verne Carrow, MD  Electronically Signed     CM/MEDQ  D:  06/19/2008  T:  06/20/2008  Job:  [id number]

## 2011-03-28 NOTE — Assessment & Plan Note (Signed)
[facility]                            CARDIOLOGY OFFICE NOTE   NAME:[name], [name]                    MRN:          [id number]  DATE:06/18/2008                            DOB:          May 16, 1946    PRIMARY CARE PHYSICIAN:  Titus Dubin. Alwyn Ren, MD, FACP, FCCP   HISTORY OF PRESENT ILLNESS:  [name] is a pleasant 65 year old  Caucasian male with a history of hypercholesterolemia, hypertension, and  nonobstructive coronary artery disease by heart catheterization in 1997  who was seen by my colleague Dr. Marca Ancona here in the [facility]  Cardiology Office on June 15, 2008, with complaints of dizziness.  Part  of his workup included a myocardial perfusion stress exercise study that  was started this morning.  The patient's baseline EKG prior to starting  the stress test showed T-wave inversions and ST segment depression in  the inferior leads.  There was also first-degree AV block with the PR  interval of 224 milliseconds and a right bundle-branch block.  The  patient exercised for 5 minutes and 22 seconds at which time he began to  have chest discomfort and burning in his bilateral lower extremities.  The chest was terminated at this time.  The patient gives no history of  chest pain with exertion over the last several weeks.  He is an active  golfer 3-4 times per week and only has been having dizziness with  exertion.  I was asked to see the patient following his termination of  the stress test.  I feel that given the baseline EKG and the onset of  chest pain during exertion, it is foremost beneficial to proceed with a  left heart catheterization especially given his history of  nonobstructive coronary artery disease 12 years ago and heart  catheterization.   PLAN:  The plan will be to schedule the left heart catheterization with  selective coronary angiography at [facility] tomorrow.  I will  perform the heart catheterization myself.   The patient has been  instructed that he should present to our hospital tomorrow.  We will  obtain precatheterization labs now.     Verne Carrow, MD  Electronically Signed    CM/MedQ  DD: 06/18/2008  DT: 06/19/2008  Job #: [id number]   cc:   Titus Dubin. Alwyn Ren, MD,FACP,FCCP  Pricilla Riffle, MD, [facility]

## 2011-03-28 NOTE — Letter (Signed)
June 15, 2008     RE:  DEVAUGHN, SAVANT  MRN:  [id number]  /  DOB:  Jul 02, 1946   To Whom It May Concern:   Alekzander Shenouda Genova is under my cardiology care here at the [facility].  He had probable type 1 second-degree heart block on a Holter  monitor done within the last week.  Although the possibility is there of  a type 2 second-degree heart block.  He was on both diltiazem and  metoprolol at that time, both are nodal active agents.  I have taken him  off diltiazem and metoprolol and would like to do another Holter monitor  off the medication to ensure that his heart block has improved.  He has  had significant symptoms of light headedness, and if his rhythm has not  improved on Holter monitor, I will probably proceed placing a pacemaker.  This is the justification for the Holter monitor ordered in close  proximity to the prior Holter monitor.    Sincerely,      Marca Ancona, MD  Electronically Signed    DM/MedQ  DD: 06/15/2008  DT: 06/16/2008  Job #: [id number]

## 2011-03-28 NOTE — Assessment & Plan Note (Signed)
[facility]                            CARDIOLOGY OFFICE NOTE   NAME:[name], [name]                    MRN:          [id number]  DATE:06/15/2008                            DOB:          [birth year]    PRIMARY CARE PHYSICIAN:  Titus Dubin. Alwyn Ren, MD, FACP, FCCP   HISTORY OF PRESENT ILLNESS:  This is a 65 year old with a history of  hypercholesterolemia, hypertension, and nonobstructive coronary artery  disease on heart catheterization in 1997 who presents to Cardiology  Clinic for evaluation of dizziness.  The patient states that he first  had an episode of dizziness in the middle of July while he was driving  his car.  He states that he became swimmy headed.  It sounds like it  was basically lightheadedness, near the periphery of his visual field it  became black; however, he did not pass out.  He was able to pull his car  over to the side of the road.  He got out and walked around and the  episode resolved.  He states that the severe lightheadedness, presyncope  only lasted for few seconds.  Again on June 02, 2008, he was walking  outside when he felt the same sensation of lightheadedness and a sense  of blackness coming on.  He says his knees buckled; however, he did not  pass out.  He sat down the side of the road and the episode resolved in  a few seconds also.  Then on June 03, 2008, he was walking once again  and had a similar feeling come on, but it was not as intense and then  this last Thursday night 7/23, after being in bed asleep for a while he  got up and walked to the bathroom and he once again felt a sense of  lightheadedness and some nausea; however, these symptoms were no as  severe as they had been during the 2 prior episodes.  The rest of the  day, however, he did remain somewhat symptomatic with mild  lightheadedness.  He went to see his primary care physician who did an  EKG showing sinus rhythm at a rate of 63 beats per minute.   He was  started on some meclizine and said he felt a little bit better with  that.  He also states that he has some mild lightheadedness with heavy  exertion as well.  He has not felt normal since last Thursday, the 23rd.  Associated with lightheadedness, he has not had any chest pain,  palpitations, or shortness of breath/dyspnea on exertion.  He has never  passed out.  The most severe episodes were the episode in early July and  episode on June 02, 2008.  Of note, the patient is taking metoprolol and  diltiazem.  He did have a Holter monitor done at his primary care  physician's office which showed a minimum heart rate of 39 that was  associated with 2:1 heart block.  He also had episodes of second-degree  heart block that represented probably Mobitz type I second degree heart  block.  As mentioned  his lowest heart rate on the Holter was 39 beats  per minute.  He also had a 7-beat run of nonsustained V-tach.   MEDICATIONS:  1. The patient takes Mobic 7.5 mg 1/2 tab p.r.n.  2. Folic acid.  3. Diltiazem, long-acting formulation 240 mg daily.  4. Vytorin 10/40 daily.  5. Metoprolol tartarate 25 mg daily.  6. Aspirin 81 mg daily.  7. Cialis p.r.n.  8. Meclizine p.r.n.   Most recent EKG shows sinus rhythm with a first-degree AV block and  right bundle-branch block with a left posterior fascicular block.  This  EKG from July 2009 is similar to the prior EKG in 2003.  However, the PR  is just a bit longer.   PAST MEDICAL HISTORY:  1. Hypercholesterolemia.  2. Nonobstructive coronary artery disease.  The patient had a left      heart catheterization in 1997 with luminal irregularities in the      left anterior descending artery.  3. Hypertension.  4. History of supraventricular tachycardia, this is thought to be      likely AVNRT.  He has had no episodes recently since he has been on      diltiazem and metoprolol.  5. Past history of tobacco abuse.  6. Transthoracic echocardiogram  in 2001 showed mild LV dilation,      normal LV function, mild left atrial enlargement, mild mitral valve      prolapse with trace mitral regurgitation.   SOCIAL HISTORY:  The patient lives with his wife.  He is retired.  He  used to smoke, but stopped a number of years ago.  Social alcohol.   FAMILY HISTORY:  Noncontributory, no early CAD.   REVIEW OF SYSTEMS:  Negative except as noted in the history of present  illness.   PHYSICAL EXAMINATION:  VITAL SIGNS:  Blood pressure; lying down is  148/93 with a pulse of 65 and regular.  Standing is 144/93, pulses 79  and regular, this is with 5 minutes of standing.  Weight is 236 pounds.  GENERAL:  No apparent distress.  Well-developed male.  NEUROLOGIC:  Alert and oriented x3.  Normal affect.  NECK:  No JVD.  LUNGS:  Clear to auscultation bilaterally.  HEART:  Regular.  S1 and S2.  No S3.  No S4.  No murmur. No carotid  bruit. There are 1+ posterior tibial pulses bilaterally.  ABDOMEN:  Soft and nontender.  No hepatosplenomegaly.  EXTREMITIES:  There is no clubbing or cyanosis.  SKIN: Normal.  HEENT: Normal.  MSK: Normal.   ASSESSMENT AND PLAN:  This is a 65 year old with a history of  nonobstructive coronary artery disease, hypercholesterolemia, and  hypertension who presents to Cardiology Clinic for evaluation of  dizziness.  1. Dizziness.  The patient has had several episodes of lightheadedness      over the past 3 or 4 weeks.  He did have a Holter monitor showing      second-degree atrioventricular block with episodes of 2:1 heart      block and probable Mobitz type I, but possible Mobitz type II      atrioventricular block.  His minimum heart rate was 39 on the      Holter.  These are worrisome findings.  I am concerned that the      source of his dizziness is due to bradycardia and heart block.  He      does not have symptoms consistent with vertigo.  At this time, we  will plan on stopping his diltiazem and his metoprolol.   If this is      indeed Mobitz type I atrioventricular block, this may improve with      the discontinuation of nodal blocking agents; however, type 2      atrioventricular block would be minimally affected by this      maneuver.  We will also do another 48-hour Holter monitor this week      off metoprolol and diltiazem to assess for continuing      atrioventricular block and he will follow up later this week with      Dr. Lewayne Bunting with EP to assess him for possible pacemaker      placement.  The patient was told not to drive until his evaluation      is completed.  He will have his wife drive for him and he will need      to go directly to the emergency department if he has any further      significant episodes of lightheadedness.  Additionally, we will get      an echocardiogram and we will also obtain an exercise Myoview given      the possibility of coronary disease as a reason for worsening of      his conduction system disease.  The patient did have similar EKG      back in 2003 which already showed evidence of disease in his      conduction system with the right bundle-branch block and the left      posterior fascicular block.  2. Hypertension.  The patient's blood pressure is elevated today.  We      actually are going to have to stop his metoprolol and his      diltiazem.  Given this hypertension, we will go ahead and start him      on hydrochlorothiazide 25 mg daily and we will have him come back      and have his blood pressure checked at which time, if it is still      high, we can start him on a nonnodal blocking agent like an ACE      inhibitor or amlodipine.  3. Hyperlipidemia.  The patient's LDL was 104 in March on his Vytorin      dose.  He will continue on this.  4. Nonobstructive coronary artery disease.  The patient had      nonobstructive coronary disease in 1997 on left heart      catheterization.  He will continue on his aspirin for now.  We will      also as  mentioned above obtain an ETT Myoview to look for ischemia      as a cause of the worsening of his conduction system disease.  This      also will actually help [place] to see the heart rate response to      exercise as well.     Marca Ancona, MD  Electronically Signed    DM/MedQ  DD: 06/15/2008  DT: 06/16/2008  Job #: [id number]   cc:   Titus Dubin. Alwyn Ren, MD,FACP,FCCP  Rollene Rotunda, MD, [facility]

## 2011-03-28 NOTE — Assessment & Plan Note (Signed)
[facility]                            CARDIOLOGY OFFICE NOTE   NAME:[name], [name]                    MRN:          [id number]  DATE:01/05/2009                            DOB:          1946-08-05    PRIMARY CARE PHYSICIAN:  Titus Dubin. Hopper, MD,FACP, FCCP   HISTORY OF PRESENT ILLNESS:  This is a 65 year old with a history of  hyperlipidemia, hypertension, and nonobstructive coronary disease as  well as tachy-brady syndrome who comes back to Cardiology Clinic for  reevaluation.  The patient was initially seen this summer.  He was  having episodes of lightheadedness and presyncope, and he was noted to  have 2nd degree AV block that was with perhaps Mobitz-type II AV block  in the setting of use of calcium-channel blocker and beta-blocker for  prevention of supraventricular tachycardia.  We did take the patient off  both his calcium-channel blocker and beta-blocker and his symptoms  resolved.  Since then, he has had no further episodes of lightheadedness  or presyncope.  He has had no palpitations.  There have been no episodes  suggestive of recurrence of the SVT.  His EKG does continue to show  conduction system disease with right bundle-branch block, left posterior  fascicular block, and a first-degree AV block.  He has been doing quite  well lately as mentioned.  He has had no episodes of chest pain.  He  gets no significant dyspnea on exertion.  He has been walking for about  40 minutes a day at least 3 times a week with no trouble.   MEDICATIONS:  1. Mobic.  2. Vytorin 10/40 daily.  3. Aspirin 81 mg daily.  4. Hydrochlorothiazide 12.5 mg daily.  5. Folate.   PAST MEDICAL HISTORY:  1. Hyperlipidemia.  2. Nonobstructive coronary artery disease.  The patient had a left      heart catheterization in August 2009 showing a 30% mid circumflex      lesion, otherwise coronary is clean.  3. Hypertension.  4. Tachy-brady syndrome.  The patient  has had episodes of both SVT and      second-degree AV block with symptomatic bradycardia.  The patient's      SVT is felt to be most likely AVNRT.  In the setting of being on      diltiazem and metoprolol to suppress his SVT, he developed second-      degree AV block.  Holter monitor showed 2:1 AV block as well as      episodes may have been type II second-degree AV block.  The patient      was taken off his calcium-channel blocker and his beta-blocker and      since that time he has had no further symptomatic bradycardia.  5. Past history of tobacco abuse.  6. Echo done in August 2009 showing EF 50-55% with no regional wall      motion abnormalities, paradoxical septal motion, mild diastolic      dysfunction, mild left atrial enlargement, normal RV size and      function.   SOCIAL HISTORY:  The patient  lives with his wife.  He is retired from  [employer].  He used to smoke, but stopped a number years ago.  Drinks  alcohol socially.   PHYSICAL EXAMINATION:  VITAL SIGNS:  Blood pressure is 130/70, heart  rate is 68 and regular, weight is 237 pounds, down from 238 last time I  saw him.  GENERAL:  There is no apparent distress.  This is a well-developed male.  NEUROLOGIC:  Alert and oriented x3.  Normal affect.  NECK:  No JVD.  No thyromegaly or thyroid nodule.  LUNGS:  Clear to auscultation bilaterally.  Normal respiratory effort.  CARDIOVASCULAR:  Heart regular.  S1 and S2.  No S3.  No S4.  No murmur.  No carotid bruit.  No peripheral edema.  ABDOMEN:  Soft and nontender.  No hepatosplenomegaly.  EXTREMITIES:  No clubbing or cyanosis.   EKG was reviewed today shows normal sinus rhythm with a first-degree AV  block.  There is right bundle-branch block and left posterior fascicular  block.  Most recent labs from October 2009, LDL is 109.   ASSESSMENT AND PLAN:  This is a 65 year old history of nonobstructive  coronary artery disease, hyperlipidemia, hypertension, as well as   tachycardia-bradycardia syndrome who presents to Cardiology Clinic for  followup.  1. Rhythm disorder.  Baseline, the patient has an abnormal EKG with      right bundle-branch block, first-degree atrioventricular block, and      left posterior fascicular block.  While taking calcium-channel      blockers and beta-blockers to suppress supraventricular      tachycardia, he did develop symptomatic bradycardia with 2:1      atrioventricular block and episodes of what appeared to be type II      second-degree atrioventricular block.  He did have presyncope.  We      took him off the medications and he has actually had no further      bradycardic-type symptoms.  He has been following with Dr. Lewayne Bunting (Electrophysiology) as well.  We are continuing to observe      him at this time, if he does have further supraventricular      tachycardia, which is likely atrioventricular nodal reentrant      tachycardia, he would need an ablation, as he cannot go back on      nodal-blocking agents.  I think there probably is a fairly high      likelihood that he will eventually progressed to the need for      pacemaker based on his conduction system disease.  2. Hypertension.  The patient's blood pressure is well controlled      today on hydrochlorothiazide.  We will continue this medication.  3. Hyperlipidemia.  The patient is on Vytorin.  We do need to check      his lipids and his liver function.  I would like to see his LDL at      least below 100.  I will increase his Vytorin if it is not.  4. Nonobstructive coronary artery disease.  The patient is on his baby      aspirin.     Marca Ancona, MD  Electronically Signed    DM/MedQ  DD: 01/05/2009  DT: 01/05/2009  Job #: [id number]   cc:   Titus Dubin. Alwyn Ren, MD,FACP,FCCP

## 2011-03-28 NOTE — Letter (Signed)
July 10, 2008    Marca Ancona, MD  [address]  [place] [address]   RE:  Jonathan, Mccarty  MRN:  [id number]  /  DOB:  [birth year]   Dear Freida Busman:   Thank you for referring Mr. [name] for EP evaluation as he now is  a very pleasant middle-aged male with history of SVT documented recently  back in 2003 as well as a history of high-grade heart block with  worsening conduction disease over the years.  Most recently, sinus  rhythm with right bundle branch block, first-degree AV block, and left  posterior fascicular block.  His symptoms of dizziness and  lightheadedness, which were bothering him very severely have improved  with discontinuation of diltiazem and metoprolol.  We can assume that  this conduction has improved and he no longer is going in and out of II-  I heart block or worse.  Of note, recent catheterization demonstrated  mild LV dysfunction and no obstructive coronary disease.   Today, I saw the patient and a copy of my dictated note will follow, but  basically I have recommend a period of watchful waiting.  Ultimately, he  may require EP study and catheter  ablation +/- pacemaker insertion.  But, because of his relative young  age, I have recommended that we undergo a period of watchful waiting.  I  will plan to see the patient back in the office in several months or  sooner if his symptoms worsen.  Thank you again for referring Mr.  [name] for EP evaluation.    Sincerely,      Doylene Canning. Ladona Ridgel, MD  Electronically Signed    GWT/MedQ  DD: 07/10/2008  DT: 07/11/2008  Job #: [id number]

## 2011-03-28 NOTE — Assessment & Plan Note (Signed)
[facility]                         ELECTROPHYSIOLOGY OFFICE NOTE   NAME:[name], [name]                    MRN:          [id number]  DATE:07/10/2008                            DOB:          [birth year]    Mr. Hornstein is referred today by Dr. Shirlee Latch for evaluation of tachybrady  syndrome.   HISTORY OF PRESENT ILLNESS:  The patient is a very pleasant 65 year old  man who has a history of SVT dating back to the 43s.  He has had  documented heart rates of over 170 beats per minute.  The most recent 12-  lead we have is this was demonstrated in 2003.  Since then, he has been  on beta blockers as well as calcium channel blockers to control his  arrhythmia.  The patient resought medical attention several months ago  when he had recurrent episodes of dizziness, lightheadedness, and  sensation as if he were about to blackout.  He was subsequently found on  cardiac monitoring to have periods of 2:1 heart block and Mobitz I  second-degree AV block perhaps and Mobitz II second-degree AV block.  This is in the setting of baseline sinus rhythm with baseline first-  degree AV block, right bundle branch block, and left posterior  fascicular block.  The patient notes that he still has occasional  palpitations and rapid heart rates.  After he was found to have fairly  marked bradycardia, his calcium channel blockers and beta-blockers were  discontinued and he has improved though not quite back to what he would  describe as normal.  Since then, however, he has had much less in the  way of dizziness.  He does occasionally feel like his heart is racing,  but the racing has not been sustained or prolonged or particularly  symptomatic.  Subsequent evaluation demonstrated mild LV dysfunction  with an EF of 50% and no obstructive coronary disease.   Additional past medical history is notable for hypertension.  He has a  history of asthma in the past.   His family  history is negative for premature coronary disease.   His social history is notable that the patient is married.  He has a  history of tobacco abuse, but stopped smoking several months ago.  He  has a history of fairly heavy drinking though he denies heavy drinking  presently.   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  He has mild dyspnea with exertion.  He still has  occasional problems with palpitations.   PHYSICAL EXAMINATION:  GENERAL:  He is a pleasant well-appearing middle-  aged man in no acute distress.  VITAL SIGNS:  Blood pressure today is 138/76, the pulse is 88 and  regular, the respirations were 18, and the weight is 236 pounds.  HEENT:  Normocephalic and atraumatic.  Pupils were equal and round.  The  oropharynx is moist.  Sclerae were anicteric.  NECK:  No jugular venous distention.  There is no thyromegaly.  Trachea  is midline.  The carotids are 2+ and symmetric.  LUNGS:  Clear bilaterally to auscultation.  No wheezes, rales, or  rhonchi are present.  There is no increased work of breathing.  CARDIOVASCULAR:  Regular rate and rhythm with normal S1 and S2.  The S2  is split.  The PMI is not particularly enlarged or laterally displaced.  ABDOMEN:  Soft, nontender, and nondistended.  There is no organomegaly.  The bowel sounds were present.  There is no rebound or guarding.  EXTREMITIES:  No cyanosis, clubbing, or edema.  The pulses were 2+ and  symmetric.  NEUROLOGIC:  Alert and oriented x2.  The cranial nerves were intact.  Strength was 5/5 and symmetric.  SKIN:  Normal.  MUSCULOSKELETAL:  Normal.   The EKG demonstrates sinus rhythm with first-degree AV block, right  bundle branch block, and left posterior fascicular block.   IMPRESSION:  1. History of supraventricular tachycardia.  2. History of bradycardia on calcium-channel blockers and beta-      blockers associated with dizziness and lightheadedness.  3. Mild  left ventricular dysfunction.   DISCUSSION:  The patient is improved with changing of his medications.  As I reviewed, the patient's EKG is conduction system disease and has  worsened over the years going from normal rhythm with basically normal  conduction to normal rhythm with right bundle branch block and now  normal rhythm with right bundle branch block, first-degree AV block, and  left posterior fascicular block.  My expectation is that the patient  will ultimately require permanent pacemaker insertion, though he does  not have a clear indication for this at the present time.  In addition,  the patient has documented SVT and while he was wearing his cardiac  monitor, he had brief episodes of SVT.  The etiology of which is  unclear, though I suspect he has a reentrant type of tachycardia.  With  all of the above, the patient has struck me today as not been terribly  symptomatic at least at the present time and I have recommended that we  undergo a period of watchful waiting.  If his rapid heart racing worsens  or increases significantly, then ablation would be required +/-  pacemaker insertion.  Obviously, if he has recurrent syncopal episodes  EP study, catheter ablation, and a permanent pacemaker would be  recommended.  I have discussed this with the patient, I planned to  discuss this as well with Dr. Shirlee Latch who is his primary cardiologist and  I will plan to see the patient back in the office in several months,  sooner should he have syncope or worsening symptoms.     Doylene Canning. Ladona Ridgel, MD  Electronically Signed    GWT/MedQ  DD: 07/10/2008  DT: 07/11/2008  Job #: [id number]   cc:   Marca Ancona, MD  Titus Dubin. Alwyn Ren, MD,FACP,FCCP

## 2011-03-28 NOTE — Assessment & Plan Note (Signed)
[facility]                            CARDIOLOGY OFFICE NOTE   NAME:Calder, ADHVIK CANADY                    MRN:          [id number]  DATE:07/01/2008                            DOB:          [birth year]    PRIMARY CARE PHYSICIAN:  Titus Dubin. Alwyn Ren, MD, FACP, FCCP.   HISTORY OF PRESENT ILLNESS:  This is a 65 year old with a history of  hypercholesterolemia, hypertension, and nonobstructive coronary artery  disease who comes back to Cardiology Clinic for reevaluation.  The  patient was initially seen on June 15, 2008, he had had several  episodes where he had presyncope and almost blacked out.  Evaluation  involved a Holter monitor showing second-degree AV block with 2:1 block  and possible Mobitz type II second-degree heart block.  His evaluation  after that involved a heart catheterization on June 19, 2008, in which  he was found to have a 30% mid circumflex lesion, otherwise, clean  coronaries.  EF was 50% with apical hypokinesis.  He also had an  echocardiogram done with EF of 50-55%, paradoxical septal motion due to  interventricular conduction delay.  His diltiazem and metoprolol were  both stopped.  He did have a repeat Holter monitor done off of the  calcium channel blocker and the beta-blocker.  He continued to have  episodes of 2:1 AV block and also had episodes that could be type II  second-degree AV block.  I am not convinced that they were definitely  type I second-degree AV block.  Since coming off of the diltiazem and  the beta-blocker, he has had no further episodes of severe  lightheadedness where he has felt like he is going to black out.  He has  felt actually reasonably well.  He has had no episodes of chest pain.  He does not have dyspnea on exertion.  He does not have orthopnea or  PND.  EKG today; normal sinus rhythm, first-degree AV block, right  bundle-branch block, and left posterior fascicular block.  This has been  essentially unchanged from past EKGs.   MEDICATIONS:  Mobic, hydrochlorothiazide 25 mg daily, folate, Vytorin  10/40 mg daily, and aspirin 81 mg daily.   LABORATORY DATA:  Most recent labs in August 2009, creatinine 1.2   PAST MEDICAL HISTORY:  1. Hypercholesterolemia.  2. Nonobstructive coronary artery disease.  The patient had left heart      catheterization on June 19, 2008 showing a 30% mid circumflex      lesion, otherwise, clean coronaries.  3. Hypertension.  4. History of supraventricular tachycardia, it was felt to be most      likely AVNRT.  He has had no episodes recently and had been on both      diltiazem and metoprolol to suppress SVT.  5. Past history of tobacco abuse.  6. Transthoracic echocardiogram done on June 16, 2008, showing EF 50-      55%, no regional wall motion abnormalities, paradoxical septal      motion, mild diastolic dysfunction, mild left atrial enlargement,      and normal RV size and function.  7.  Second-degree AV block.  The patient has had Holter monitors done      both on and off neuroblocking agents.  Both Holter monitor showed      episodes of 2:1 AV block as well as episodes of what might be type      II second-degree AV block.  I am not able to completely convince      myself that these were actually episodes of type I second-degree AV      block.   SOCIAL HISTORY:  The patient lives with his wife.  He is retired from  [employer].  He used to smoke, but stopped number of years ago.  Drinks  alcohol socially.   PHYSICAL EXAMINATION:  VITAL SIGNS:  Blood pressure 128/85, heart rate  91 and irregular, weights is 238 pounds.  GENERAL:  In no apparent distress.  This is a well-developed male.  NEUROLOGIC:  Alert and oriented x3.  Normal affect.  NECK:  No JVD.  LUNGS:  Clear to auscultation bilaterally.  HEART:  Regular S1 and S2.  No S3 and no S4.  There is no murmur.  There  is no carotid bruit.  There is no peripheral edema.  ABDOMEN:  Soft  and  nontender.  No hepatosplenomegaly.  EXTREMITIES:  No clubbing or cyanosis.   ASSESSMENT AND PLAN:  This is a 65 year old with a history of  nonobstructive coronary artery disease, hypercholesterolemia, and  hypertension, who presents to Cardiology Clinic for evaluation of  episodes of presyncope and second-degree atrioventricular block.  1. Rhythm disorder.  At baseline, the patient has an abnormal EKG with      a right bundle-branch block and first-degree AV block.  He does      have evidence for second-degree AV block as well on 2 Holter      monitors, one done on calcium channel blocker and beta-blocker, and      one done off calcium channel and beta-blocker.  Both showed      episodes of 2:1 AV block as well as what could be type II second-      degree AV block.  I am not able to completely convince myself that      this is type I second-degree AV block.  Off the calcium channel and      beta-blocker, he has had no further episodes of presyncope.  We      will continue to keep him off the nodal blocking agents.  I do want      to get the opinion of an electrophysiologist on this patient's      rhythm, so I am going to have him see Dr. Lewayne Bunting.  He is      going to see him on July 10, 2008, if he thinks that this is      Mobitz type II second-degree AV block, then pacemaker would      probably be in order.  2. Hypertension.  The patient's blood pressure is well controlled      today on hydrochlorothiazide.  We will continue this medication.  3. Hyperlipidemia.  The patient will continue on his Vytorin.  4. Nonobstructive coronary artery disease.  The patient will continue      on his baby aspirin.     Marca Ancona, MD  Electronically Signed    DM/MedQ  DD: 07/01/2008  DT: 07/02/2008  Job #: [id number]   cc:   Titus Dubin. Alwyn Ren, MD,FACP,FCCP  Everardo Beals Juanda Chance, MD,  FACC

## 2011-03-28 NOTE — Assessment & Plan Note (Signed)
[facility]                         ELECTROPHYSIOLOGY OFFICE NOTE   NAME:[name], [name]                    MRN:          [id number]  DATE:09/02/2008                            DOB:          [birth year]    [name] returns today for followup.  He is a very pleasant, middle-  aged male with a history of symptomatic tachy-brady syndrome, who was  referred to me initially back in August.  The patient denies chest pain  or shortness of breath.  He had previously been on metoprolol and  diltiazem for control of his SVT, but with cessation of this, his  episodes of dizziness and bradycardia have resolved.  He notes problems  with arthritis and try to initially stop Mobic, but had to restart this  secondary to problems with joint pain.   CURRENT MEDICINES:  1. Mobic 7.5 mg one-half to one tablet daily.  2. Folate 1 mg daily.  3. Vytorin 10/40 a day.  4. Aspirin 81 a day.  5. HCTZ 25 mg one-half tablet daily.   On exam, he is a pleasant, well-appearing middle-aged man in no acute  distress.  Blood pressure was 144/82, the pulse 76 and regular, respirations were  18, the weight was 236 pounds.  NECK:  No jugular distention.  LUNGS:  Clear bilaterally auscultation.  No wheezes, rales, or rhonchi  are present.  CARDIOVASCULAR:  Regular rate and rhythm.  Normal S1 and S2.  The S2 was  split.  ABDOMINAL:  Soft, nontender.  EXTREMITIES:  No edema.   IMPRESSION:  1. Symptomatic bradycardia, now resolved.  2. History of SVT with no recurrence.  3. Hypertension, well controlled.   DISCUSSION:  Overall [name] is stable.  He has had no more  symptomatic tachycardia or bradycardia.  For now, I would like to  recommend that we continue him on his current medical therapy.  He may  ultimately have recurrent SVT and if he does catheter ablation would be  really only option with his inability to take AV nodal blocking drugs  secondary to bradycardia.  I  will see the patient back in 1 year or  sooner if he have symptomatic SVT or bradycardia.     Doylene Canning. Ladona Ridgel, MD  Electronically Signed    GWT/MedQ  DD: 09/02/2008  DT: 09/03/2008  Job #: [id number]

## 2011-04-17 ENCOUNTER — Ambulatory Visit (INDEPENDENT_AMBULATORY_CARE_PROVIDER_SITE_OTHER): Payer: BC Managed Care – PPO | Admitting: Internal Medicine

## 2011-04-17 ENCOUNTER — Encounter: Payer: Self-pay | Admitting: Internal Medicine

## 2011-04-17 DIAGNOSIS — I10 Essential (primary) hypertension: Secondary | ICD-10-CM

## 2011-04-17 DIAGNOSIS — I442 Atrioventricular block, complete: Secondary | ICD-10-CM

## 2011-04-17 DIAGNOSIS — I441 Atrioventricular block, second degree: Secondary | ICD-10-CM

## 2011-04-17 NOTE — Patient Instructions (Signed)
Your physician wants you to follow-up in: 01/2011 with Dr Jacquiline Doe will receive a reminder letter in the mail two months in advance. If you don't receive a letter, please call our office to schedule the follow-up appointment.

## 2011-04-23 ENCOUNTER — Encounter: Payer: Self-pay | Admitting: Internal Medicine

## 2011-04-23 NOTE — Assessment & Plan Note (Signed)
Salt restriction advised He will follow closely with Dr Alwyn Ren.

## 2011-04-23 NOTE — Assessment & Plan Note (Signed)
Normal pacemaker function See Pace Art report No changes today  

## 2011-04-23 NOTE — Progress Notes (Signed)
The patient presents today for routine electrophysiology followup.  Since having his pacemaker implanted, the patient reports doing very well.  His exercise tolerance and fatigue have much improved.  He continues to have occasional headaches for which he is followed by his PCP.  Today, he denies symptoms of fevers, chills,  palpitations, chest pain, shortness of breath, orthopnea, PND, lower extremity edema, dizziness, presyncope, syncope, or neurologic sequela.  The patient feels that he is tolerating medications without difficulties and is otherwise without complaint today.   Past Medical History  Diagnosis Date  . Hyperlipidemia   . Coronary artery disease     Non-obstructive. Pt had left heart catheterization in August 2009 showing a 30% mid-circumflex lesion, otherwise coronaries were clean angiographically  . Hypertension   . OSA (obstructive sleep apnea)     Probable  . Complete heart block     s/p PPM by JA   Past Surgical History  Procedure Date  . Cardiac catheterization 1993, 2009    2009- negative  . Pacemaker insertion 01/13/11    MDT by Fawn Kirk for complete heart block    Current Outpatient Prescriptions  Medication Sig Dispense Refill  . aspirin 81 MG tablet Take 81 mg by mouth daily.        . clobetasol (TEMOVATE) 0.05 % cream Apply 1 application topically as needed.        . ezetimibe-simvastatin (VYTORIN) 10-40 MG per tablet Take 1 tablet by mouth at bedtime.        . fish oil-omega-3 fatty acids 1000 MG capsule Take 1 g by mouth 2 (two) times daily.        . fluticasone (FLONASE) 50 MCG/ACT nasal spray Place 2 sprays into the nose daily.        . folic acid (FOLVITE) 1 MG tablet Take 1 mg by mouth daily.        Marland Kitchen losartan (COZAAR) 100 MG tablet Take 100 mg by mouth daily.        . meloxicam (MOBIC) 7.5 MG tablet Take by mouth daily. 1/2 - 1 tablet daily as needed       . tadalafil (CIALIS) 20 MG tablet Take 20 mg by mouth daily as needed.        . traMADol (ULTRAM) 50 MG  tablet Take 0.5-1 tablets (25-50 mg total) by mouth every 6 (six) hours as needed.  30 tablet  1    Allergies  Allergen Reactions  . Altace     cough  . Atorvastatin   . Sildenafil     History   Social History  . Marital Status: Married    Spouse Name: N/A    Number of Children: N/A  . Years of Education: N/A   Occupational History  . Not on file.   Social History Main Topics  . Smoking status: Former Smoker    Quit date: 11/13/2005  . Smokeless tobacco: Not on file  . Alcohol Use: Yes  . Drug Use: No  . Sexually Active: Not on file   Other Topics Concern  . Not on file   Social History Narrative  . No narrative on file    Family History  Problem Relation Age of Onset  . Heart disease Maternal Grandfather   . Diabetes Other   . Heart attack Other   . Peripheral vascular disease Other    Physical Exam: Filed Vitals:   04/17/11 1354  BP: 144/86  Pulse: 89  Height: 6\' 5"  (1.956 m)  Weight:  239 lb (108.41 kg)    GEN- The patient is well appearing, alert and oriented x 3 today.   Head- normocephalic, atraumatic Eyes-  Sclera clear, conjunctiva pink Ears- hearing intact Oropharynx- clear Neck- supple, no JVP Lymph- no cervical lymphadenopathy Lungs- Clear to ausculation bilaterally, normal work of breathing Chest- pacemaker pocket is well healed Heart- Regular rate and rhythm, no murmurs, rubs or gallops, PMI not laterally displaced GI- soft, NT, ND, + BS Extremities- no clubbing, cyanosis, or edema MS- no significant deformity or atrophy Skin- no rash or lesion Psych- euthymic mood, full affect Neuro- strength and sensation are intact  Pacemaker interrogation- reviewed in detail today,  See PACEART report  Assessment and Plan:

## 2011-05-11 ENCOUNTER — Other Ambulatory Visit: Payer: Self-pay | Admitting: Internal Medicine

## 2011-05-11 MED ORDER — FOLIC ACID 1 MG PO TABS: 1.0000 mg | ORAL_TABLET | Freq: Every day | ORAL | Status: DC

## 2011-06-07 ENCOUNTER — Telehealth: Payer: Self-pay | Admitting: Internal Medicine

## 2011-06-07 NOTE — Telephone Encounter (Signed)
Pt calling re symptoms he is dizzy, moving slow, maybe bp low, tingling right arm-pls call

## 2011-06-07 NOTE — Telephone Encounter (Signed)
Advised patient his pacer was checked in 04/2011 and was working fine at that time  He was advised by Dr [name] to follow closely with Dr Alwyn Ren for BP  I have Advised him to call Dr Caryl Never office for BP check and to see if this may be the problem  He says he only feels dizzy if he gets up to quickly but he just feels sluggish

## 2011-06-09 ENCOUNTER — Ambulatory Visit (INDEPENDENT_AMBULATORY_CARE_PROVIDER_SITE_OTHER): Payer: BC Managed Care – PPO | Admitting: Internal Medicine

## 2011-06-09 ENCOUNTER — Encounter: Payer: Self-pay | Admitting: Internal Medicine

## 2011-06-09 DIAGNOSIS — M79609 Pain in unspecified limb: Secondary | ICD-10-CM

## 2011-06-09 DIAGNOSIS — M79671 Pain in right foot: Secondary | ICD-10-CM

## 2011-06-09 DIAGNOSIS — R350 Frequency of micturition: Secondary | ICD-10-CM

## 2011-06-09 DIAGNOSIS — R300 Dysuria: Secondary | ICD-10-CM

## 2011-06-09 DIAGNOSIS — G5600 Carpal tunnel syndrome, unspecified upper limb: Secondary | ICD-10-CM

## 2011-06-09 LAB — POCT URINALYSIS DIPSTICK
Bilirubin, UA: NEGATIVE
Glucose, UA: NEGATIVE
Spec Grav, UA: 1.01

## 2011-06-09 LAB — TSH: TSH: 1.251 u[IU]/mL (ref 0.350–4.500)

## 2011-06-09 LAB — URIC ACID: Uric Acid, Serum: 7.7 mg/dL (ref 4.0–7.8)

## 2011-06-09 MED ORDER — PREDNISONE 20 MG PO TABS: 20.0000 mg | ORAL_TABLET | Freq: Two times a day (BID) | ORAL | Status: AC

## 2011-06-09 NOTE — Progress Notes (Signed)
Addended by: Legrand Como on: 06/09/2011 05:04 PM   Modules accepted: Orders

## 2011-06-09 NOTE — Progress Notes (Signed)
Subjective:    Patient ID: [name], male    DOB: [birth year], 65 y.o.   MRN: [id number]  HPI  #1 Extremity pain with swelling Location: R dorsal  foot Onset:7/24 Trigger/injury: seafood X 5 last week; Glucosamine started 2 weeks ago Pain quality:sharp & burning Pain severity:up to 9; 3-4 @ rest Duration:constant if walking Radiation:Exacerbating factors: Treatment/response:Advil 4/ day; ice with only partial relief Review of systems: Constitutional: no fever, chills, sweats, change in weight  Musculoskeletal:no  muscle cramps or pain; some  joint stiffness, redness, temp increase &  swelling Skin:no rash, color change Neuro: no :weakness; numbness and tingling Heme: abnormal bruising or bleeding PMH: similar episodesX 2 since Oct 11   #2 Paresthesias  Location: RUE from elbow down Onset:3 weeks ago Quality:tingling Severity:2-3 Duration:hours, intermittent Trigger/exacerbating factors:? Using laptop Relievers:no Treatment/response:no treatment Associated signs and symptoms: Visual changes:no Swallowing dysfunction:no Chest pain/palpitations/irregular rhythm: beats faster when "sluggish" Bowel changes:no Hair/ skin/nail changes:no Weakness/gait dysfunction/tremor:no Anxiety/depression/panic attacks:no Heat/cold intolerance:no  #3 Frequency Onset: 2-3 mos    Worsening: stable Symptoms Urgency: yes  Hesitancy: no  Hematuria: no  Flank Pain: no  Fever: no    Nausea/Vomiting: no  Discharge: no Red Flags  : (Risk Factors for Complicated UTI) Recent Antibiotic Usage (last 30 days): no  Symptoms lasting more than seven (7) days: yes  More than 3 UTI's last 12 months: no  PMH of  1. DM: no 2. Renal Disease/Calculi: no 3. Urinary Tract Abnormality: no  4. Immunosuppression: no                 Review of Systems he states that the urine does have a strong odor. He has not noted  pyuria.     Objective:   Physical Exam Gen.: Healthy and  well-nourished in appearance. He is in no distress, he is having obvious discomfort in the right foot.  Eyes: No corneal or conjunctival inflammation noted. No icterus. No lid lag Neck: No deformities, masses, or tenderness noted. Range of motion normal. Thyroid  normal. Lungs: Normal respiratory effort; chest expands symmetrically. Lungs are clear to auscultation without rales, wheezes, or increased work of breathing. Heart: Normal rate and rhythm. Normal S1 and S2. No gallop, click, or rub. S4 w/o  Murmur. Sounds distant  Abdomen:  soft and non-tender without masses, organomegaly or hernias noted.  No guarding or rebound                                                                                  Musculoskeletal/extremities: No deformity or scoliosis noted of  the thoracic or lumbar spine. No clubbing, cyanosis, edema. Dupuytren's contractures  are present bilaterally.  Postoperative changes are noted on the left.Tone & strength  normal. Nail health  good. The right foot is swollen and tender over the dorsum of the foot. Vascular: Carotid, radial artery, dorsalis pedis and  posterior tibial pulses are full and equal. No bruits present. Neurologic: Alert and oriented x3. Deep tendon reflexes symmetrical and  0-1/2+.          Skin: Intact without suspicious lesions or rashes. He is well tanned, but the foot almost appears brawny in color. Lymph:  No cervical, axillary  lymphadenopathy present. Psych: Mood and affect are normal. Normally interactive                                                                                         Assessment & Plan:   #1 pain and swelling the right foot in the context of increased congestion shellfish; acute gout suggested  #2 tingling right upper extremity; carpal tunnel suggested. This is probably related to computer use.  #3 frequency and  malodorous urine.  Plan: #1 colchicine is not available; a short course of steroids will be initiated  pending labs  #2 thyroid function tests. He should employ a wrist board @ computer  #3 clean catch urine for urinalysis and culture and sensitivity.

## 2011-06-09 NOTE — Patient Instructions (Addendum)
Drink as much nondairy fluids as possible. Avoid spicy foods or alcohol as  these may aggravate symptoms . Go to [facility] for GOUT .  Do not take Advil for the pain; use Aleve one to 2 every 8-12 hours with food as needed.

## 2011-06-10 LAB — URINE CULTURE

## 2011-06-12 ENCOUNTER — Telehealth: Payer: Self-pay

## 2011-06-12 MED ORDER — ALLOPURINOL 100 MG PO TABS: 100.0000 mg | ORAL_TABLET | Freq: Every day | ORAL | Status: DC

## 2011-06-12 NOTE — Telephone Encounter (Signed)
Message copied by Edgardo Roys on Mon Jun 12, 2011  8:41 AM ------      Message from: Pecola Lawless      Created: Sat Jun 10, 2011  8:48 AM       Your urinalysis is normal; no sign of infection or microscopic bleeding.       TSH (Thyroid Stimulating Hormone) normal range = 0.35- 5.50. Ideal value is 1-3. Your thyroid function  studies are normal.                 To prevent gout the minimal uric acid goal is < 7; preferred is < 6, ideally < 5 to prevent gout.  The most common cause of elevated uric acid is the ingestion of sugar from high fructose corn syrup sources. You should consume less than 40 grams  of sugar per day from foods and drinks with high fructose corn syrup as number 1, 2, 3, or #4 on the label. Once the acute attack has been  Resolved for @ least 2 weeks ; you should start Allopurinol 100 mg daily & recheck uric acid after 8 weeks. This medication can not be used with acute gout , but is  preventive  By lowering uric acid. [employer]

## 2011-06-12 NOTE — Telephone Encounter (Signed)
RX and labs mailed to patient  

## 2011-06-21 ENCOUNTER — Other Ambulatory Visit: Payer: Self-pay | Admitting: Internal Medicine

## 2011-06-21 ENCOUNTER — Telehealth: Payer: Self-pay | Admitting: Internal Medicine

## 2011-06-21 MED ORDER — PREDNISONE 20 MG PO TABS: ORAL_TABLET | ORAL | Status: DC

## 2011-06-21 NOTE — Telephone Encounter (Signed)
Pt called says he is having gout flare in R big toe was seen 7/27 for foot pain wants to know if he could get rx for prednisone called to [facility]  Left msg for pt to return call.

## 2011-06-21 NOTE — Telephone Encounter (Signed)
Prednisone 20 mg 1/2 pill  3 times a day with meals as needed # 9

## 2011-06-21 NOTE — Telephone Encounter (Signed)
Spoke w/ pt says that symptoms never resolved completely but did notice some improvement and hasn't had a chance to start allopurinol since he doesn't think it actually went away. Says now gout has radiated to R big toe and would like rx. Pls advise.

## 2011-06-21 NOTE — Telephone Encounter (Signed)
Pt aware medication  sent to pharmacy 

## 2011-07-24 ENCOUNTER — Other Ambulatory Visit: Payer: Self-pay | Admitting: Internal Medicine

## 2011-07-24 MED ORDER — EZETIMIBE-SIMVASTATIN 10-40 MG PO TABS: 1.0000 | ORAL_TABLET | Freq: Every day | ORAL | Status: DC

## 2011-07-24 NOTE — Telephone Encounter (Signed)
[facility]

## 2011-07-26 ENCOUNTER — Other Ambulatory Visit: Payer: Self-pay | Admitting: *Deleted

## 2011-07-26 MED ORDER — EZETIMIBE-SIMVASTATIN 10-40 MG PO TABS: 1.0000 | ORAL_TABLET | Freq: Every day | ORAL | Status: DC

## 2011-08-29 ENCOUNTER — Ambulatory Visit (INDEPENDENT_AMBULATORY_CARE_PROVIDER_SITE_OTHER): Payer: BC Managed Care – PPO

## 2011-08-29 DIAGNOSIS — Z23 Encounter for immunization: Secondary | ICD-10-CM

## 2011-09-29 ENCOUNTER — Telehealth: Payer: Self-pay | Admitting: *Deleted

## 2011-09-29 MED ORDER — PREDNISONE 10 MG PO TABS: 10.0000 mg | ORAL_TABLET | Freq: Two times a day (BID) | ORAL | Status: AC

## 2011-09-29 NOTE — Telephone Encounter (Signed)
Discuss with patient, appt scheduled, Rx sent.

## 2011-09-29 NOTE — Telephone Encounter (Signed)
Prednisone 10 mg twice a day with meals dispense #10; office  visit if not better. Schedule uric acid   to assess need for preventive therapy (274.9)

## 2011-09-29 NOTE — Telephone Encounter (Signed)
Left message to call office

## 2011-09-29 NOTE — Telephone Encounter (Signed)
Pt left VM that his gout in his right ankle has flare up again. Pt would like to know if Dr hopper would Rx around of prednisone like he did before or does he need to come in to be seen.Please advise

## 2011-10-02 ENCOUNTER — Other Ambulatory Visit (INDEPENDENT_AMBULATORY_CARE_PROVIDER_SITE_OTHER): Payer: BC Managed Care – PPO

## 2011-10-02 DIAGNOSIS — Z23 Encounter for immunization: Secondary | ICD-10-CM

## 2011-10-02 DIAGNOSIS — M109 Gout, unspecified: Secondary | ICD-10-CM

## 2011-10-02 DIAGNOSIS — Z0000 Encounter for general adult medical examination without abnormal findings: Secondary | ICD-10-CM

## 2011-10-02 LAB — URIC ACID: Uric Acid, Serum: 6.6 mg/dL (ref 4.0–7.8)

## 2011-10-02 NOTE — Progress Notes (Signed)
12  

## 2011-10-03 ENCOUNTER — Other Ambulatory Visit: Payer: Self-pay | Admitting: Internal Medicine

## 2011-11-21 ENCOUNTER — Other Ambulatory Visit: Payer: Self-pay | Admitting: Internal Medicine

## 2011-11-28 ENCOUNTER — Other Ambulatory Visit: Payer: Self-pay | Admitting: Internal Medicine

## 2011-11-28 ENCOUNTER — Other Ambulatory Visit: Payer: Self-pay | Admitting: *Deleted

## 2011-11-28 MED ORDER — PREDNISONE 20 MG PO TABS: ORAL_TABLET | ORAL | Status: DC

## 2011-11-28 NOTE — Telephone Encounter (Signed)
Dr.Hopper please advise on request for prednisone 

## 2011-11-28 NOTE — Telephone Encounter (Signed)
20 mg one half 3 times a day with meals #9. Office visit if no better

## 2011-11-30 ENCOUNTER — Telehealth: Payer: Self-pay

## 2011-11-30 NOTE — Telephone Encounter (Signed)
Patient called with 2 concerns 1.) Patient would like to know if Dr.Hopper would change his Allopurinol to 300 mg tab vs the current dose of 100mg   2.) Patient would like to participate in a study for a gout medication-? What Dr.Hopper's view is on drug studies   Dr.Hopper please advise

## 2011-11-30 NOTE — Telephone Encounter (Signed)
If he is having gout, the higher dose of allopurinol may likely exacerbate the gout attack. I think this drug study is a good idea. He'll get free testing  and more likely than not a therapeutic agent.

## 2011-12-01 NOTE — Telephone Encounter (Signed)
Left message on voicemail with Dr.Hopper's response, patient to call if questions or concerns

## 2011-12-27 ENCOUNTER — Encounter: Payer: Self-pay | Admitting: Internal Medicine

## 2011-12-27 ENCOUNTER — Ambulatory Visit (INDEPENDENT_AMBULATORY_CARE_PROVIDER_SITE_OTHER): Payer: BC Managed Care – PPO | Admitting: Internal Medicine

## 2011-12-27 DIAGNOSIS — Z8739 Personal history of other diseases of the musculoskeletal system and connective tissue: Secondary | ICD-10-CM | POA: Insufficient documentation

## 2011-12-27 DIAGNOSIS — R599 Enlarged lymph nodes, unspecified: Secondary | ICD-10-CM

## 2011-12-27 DIAGNOSIS — R7309 Other abnormal glucose: Secondary | ICD-10-CM

## 2011-12-27 DIAGNOSIS — R590 Localized enlarged lymph nodes: Secondary | ICD-10-CM

## 2011-12-27 DIAGNOSIS — I10 Essential (primary) hypertension: Secondary | ICD-10-CM

## 2011-12-27 DIAGNOSIS — M109 Gout, unspecified: Secondary | ICD-10-CM | POA: Insufficient documentation

## 2011-12-27 DIAGNOSIS — E785 Hyperlipidemia, unspecified: Secondary | ICD-10-CM

## 2011-12-27 LAB — CBC WITH DIFFERENTIAL/PLATELET
Basophils Relative: 0.4 % (ref 0.0–3.0)
Eosinophils Relative: 4.2 % (ref 0.0–5.0)
Lymphocytes Relative: 44.5 % (ref 12.0–46.0)
Monocytes Relative: 9.6 % (ref 3.0–12.0)
Neutrophils Relative %: 41.3 % — ABNORMAL LOW (ref 43.0–77.0)
Platelets: 208 10*3/uL (ref 150.0–400.0)
RBC: 4.38 Mil/uL (ref 4.22–5.81)
WBC: 3.8 10*3/uL — ABNORMAL LOW (ref 4.5–10.5)

## 2011-12-27 LAB — LIPID PANEL
Total CHOL/HDL Ratio: 3
VLDL: 31.6 mg/dL (ref 0.0–40.0)

## 2011-12-27 LAB — HEMOGLOBIN A1C: Hgb A1c MFr Bld: 5.7 % (ref 4.6–6.5)

## 2011-12-27 LAB — BASIC METABOLIC PANEL
CO2: 25 mEq/L (ref 19–32)
Chloride: 104 mEq/L (ref 96–112)
GFR: 76.42 mL/min (ref >60.00)
Glucose, Bld: 92 mg/dL (ref 70–99)
Potassium: 4.1 mEq/L (ref 3.5–5.1)
Sodium: 138 mEq/L (ref 135–145)

## 2011-12-27 LAB — HEPATIC FUNCTION PANEL
ALT: 37 U/L (ref 0–53)
AST: 38 U/L — ABNORMAL HIGH (ref 0–37)
Albumin: 4.2 g/dL (ref 3.5–5.2)
Alkaline Phosphatase: 35 U/L — ABNORMAL LOW (ref 39–117)
Total Protein: 6.7 g/dL (ref 6.0–8.3)

## 2011-12-27 NOTE — Progress Notes (Signed)
Addended byMarga Melnick F on: 12/27/2011 11:08 AM   Modules accepted: Orders

## 2011-12-27 NOTE — Assessment & Plan Note (Addendum)
A1 C. is needed because of fasting glucose of  126 in  3/12.

## 2011-12-27 NOTE — Assessment & Plan Note (Signed)
Blood pressure is excellent; renal function was normal 01/13/11 but potassium was mildly elevated at 5.3. Chemistry should be repeated

## 2011-12-27 NOTE — Progress Notes (Signed)
  Subjective:    Patient ID: [name], male    DOB: 1946/10/22, 66 y.o.   MRN: [id number]  HPI HYPERTENSION: Disease Monitoring: Blood pressure range-checked occasionally  Chest pain, palpitations- some palpitations; he drinks 2 cups of coffee in the morning and a cup a 10 afternoon. He remains a nonsmoker.      Dyspnea- no Medications: Compliance-yes Lightheadedness,Syncope- no    Edema- no  FASTING HYPERGLYCEMIA: Fasting blood sugar was 126 on 01/13/11 Disease Monitoring: Blood Sugar ranges-not monitored  olyuria/phagia/dipsia- no     Visual problems- no Diet- gout preference interventions  HYPERLIPIDEMIA: Disease Monitoring: See symptoms for Hypertension Medications: Compliance- yes Abd pain, bowel changes- no   Muscle aches- no     Review of Systems  His last gout attack was in November. His uric acid was 6.6 on 10/02/11. He has been taking the Cox 2 agent daily. I discussed the black box warning on this drug for increased risk of ulcer or heart disease.  He's noticed some possible lymph node enlargement in the right submandibular area in the last several  months. He questions that it  is enlarging     Objective:   Physical Exam Gen.: Healthy and well-nourished in appearance. Alert, appropriate and cooperative throughout exam. Head: Normocephalic without obvious abnormalities  Eyes: No corneal or conjunctival inflammation noted. Mouth: Oral mucosa and oropharynx reveal no lesions or exudates. Teeth in good repair. Neck: No deformities or tenderness noted. There appear to be single submandibular lymph nodes slightly larger on the right and left. These seem to be too high to be  the arytenoid cartilages. Thyroid normal. Lungs: Normal respiratory effort; chest expands symmetrically. Lungs are clear to auscultation without rales, wheezes, or increased work of breathing. Heart: Normal rate and rhythm. Normal S1 and S2. No gallop, click, or rub. Heart sounds are  distant Abdomen: Bowel sounds normal; abdomen soft and nontender. No masses, organomegaly or hernias noted.                                                                                    Musculoskeletal/extremities: Dupuytren's palmar contractions are present. This is significant in the right palm; he has had surgery on the left. Vascular: Carotid, radial artery, dorsalis pedis and  posterior tibial pulses are full and equal. No bruits present. Neurologic: Alert and oriented x3. Deep tendon reflexes symmetrical and normal.          Skin: Intact without suspicious lesions or rashes. Lymph: No cervical, axillary, or inguinal lymphadenopathy present other than as noted above. Psych: Mood and affect are normal. Normally interactive                                                                                         Assessment & Plan:

## 2011-12-27 NOTE — Patient Instructions (Addendum)
Preventive Health Care: Exercise at least 30-45 minutes a day,  3-4 days a week.  Eat a low-fat diet with lots of fruits and vegetables, up to 7-9 servings per day. Consume less than 40 grams of sugar per day from foods & drinks with High Fructose Corn Sugar as # 1,2,3 or # 4 on label. Health Care Power of Attorney & Living Will. Complete if not in place ; these place you in charge of your health care decisions. Blood Pressure Goal  Ideally is an AVERAGE < 135/85. This AVERAGE should be calculated from @ least 5-7 BP readings taken @ different times of day on different days of week. You should not respond to isolated BP readings , but rather the AVERAGE for that week  

## 2011-12-28 ENCOUNTER — Telehealth: Payer: Self-pay | Admitting: *Deleted

## 2011-12-28 MED ORDER — ALLOPURINOL 100 MG PO TABS: 300.0000 mg | ORAL_TABLET | Freq: Every day | ORAL | Status: DC

## 2011-12-28 NOTE — Telephone Encounter (Signed)
Message copied by Verdene Rio on Thu Dec 28, 2011 11:16 AM ------      Message from: Pecola Lawless      Created: Wed Dec 27, 2011  6:33 PM       Please send new  Rx for Allopurinol 300 mg #90, R X 3 in place of 100 mg pills. Thanks, [employer]

## 2011-12-28 NOTE — Telephone Encounter (Signed)
Rx sent 

## 2012-01-01 ENCOUNTER — Encounter: Payer: Self-pay | Admitting: Internal Medicine

## 2012-01-09 ENCOUNTER — Encounter: Payer: Self-pay | Admitting: Internal Medicine

## 2012-01-10 ENCOUNTER — Encounter: Payer: BC Managed Care – PPO | Admitting: Internal Medicine

## 2012-01-31 ENCOUNTER — Encounter: Payer: Self-pay | Admitting: Internal Medicine

## 2012-01-31 ENCOUNTER — Ambulatory Visit (INDEPENDENT_AMBULATORY_CARE_PROVIDER_SITE_OTHER): Payer: BC Managed Care – PPO | Admitting: Internal Medicine

## 2012-01-31 VITALS — BP 126/78 | HR 75 | Resp 18 | Ht 77.0 in | Wt 246.0 lb

## 2012-01-31 DIAGNOSIS — I442 Atrioventricular block, complete: Secondary | ICD-10-CM

## 2012-01-31 LAB — PACEMAKER DEVICE OBSERVATION
AL AMPLITUDE: 2.8 mv
AL IMPEDENCE PM: 479 Ohm
AL THRESHOLD: 0.875 v
ATRIAL PACING PM: 4.5
BAMS-0001: 150 {beats}/min
BATTERY VOLTAGE: 2.79 v
RV LEAD AMPLITUDE: 11.2 mv
RV LEAD IMPEDENCE PM: 639 Ohm
RV LEAD THRESHOLD: 0.625 v
VENTRICULAR PACING PM: 100

## 2012-01-31 NOTE — Patient Instructions (Signed)
Remote monitoring is used to monitor your Pacemaker of ICD from home. This monitoring reduces the number of office visits required to check your device to one time per year. It allows [place] to keep an eye on the functioning of your device to ensure it is working properly. You are scheduled for a device check from home on May 02, 2012. You may send your transmission at any time that day. If you have a wireless device, the transmission will be sent automatically. After your physician reviews your transmission, you will receive a postcard with your next transmission date.  Your physician wants you to follow-up in: 1 year with Dr [name].  You will receive a reminder letter in the mail two months in advance. If you don't receive a letter, please call our office to schedule the follow-up appointment.

## 2012-01-31 NOTE — Assessment & Plan Note (Signed)
Normal pacemaker function See Pace Art report  1:1 AV conduction today I have reprogrammed AV delay to promote intrinsic conduction  His device suggests episodes of afib, however electrogram storage was inadequate for me to assess. I have reprogrammed electrograms today also.  If he is found to have afib upon return, then he may require anticoagulation.

## 2012-01-31 NOTE — Progress Notes (Signed)
PCP:  Marga Melnick, MD, MD  The patient presents today for routine electrophysiology followup.  Since last being seen in our clinic, the patient reports doing very well.  Today, he denies symptoms of palpitations, chest pain, shortness of breath, orthopnea, PND, lower extremity edema, dizziness, presyncope, syncope, or neurologic sequela.  The patient feels that he is tolerating medications without difficulties and is otherwise without complaint today.   Past Medical History  Diagnosis Date  . Hyperlipidemia   . Coronary artery disease     Non-obstructive. Pt had left heart catheterization in August 2009 showing a 30% mid-circumflex lesion, otherwise coronaries were clean angiographically  . Hypertension   . OSA (obstructive sleep apnea)     Probable  . Complete heart block     S/P  PPM by JA   Past Surgical History  Procedure Date  . Cardiac catheterization 1993, 2009    2009- negative  . Pacemaker insertion 01/13/11    MDT by Fawn Kirk for complete heart block    Current Outpatient Prescriptions  Medication Sig Dispense Refill  . allopurinol (ZYLOPRIM) 100 MG tablet Take 3 tablets (300 mg total) by mouth daily.  90 tablet  3  . aspirin 81 MG tablet Take 81 mg by mouth daily.        . clobetasol (TEMOVATE) 0.05 % cream Apply 1 application topically as needed.        . ezetimibe-simvastatin (VYTORIN) 10-40 MG per tablet Take 1 tablet by mouth at bedtime.  30 tablet  9  . fish oil-omega-3 fatty acids 1000 MG capsule Take 1 g by mouth 2 (two) times daily.        . fluticasone (FLONASE) 50 MCG/ACT nasal spray Place 2 sprays into the nose daily.        . folic acid (FOLVITE) 1 MG tablet Take 1 tablet (1 mg total) by mouth daily.  30 tablet  6  . losartan (COZAAR) 100 MG tablet Take 100 mg by mouth daily.        . meloxicam (MOBIC) 7.5 MG tablet TAKE 1/2 TO 1 TABLET BY MOUTH DAILY AS NEEDED. AVOID DAILY USE DUE TO GI AND    CARDIAC RISKS.  90 tablet  0  . predniSONE (DELTASONE) 20 MG tablet 1/2  by mouth three times daily, OV if no better  9 tablet  0  . tadalafil (CIALIS) 20 MG tablet Take 20 mg by mouth daily as needed.        . traMADol (ULTRAM) 50 MG tablet Take 0.5-1 tablets (25-50 mg total) by mouth every 6 (six) hours as needed.  30 tablet  1    Allergies  Allergen Reactions  . Altace     cough  . Atorvastatin     Leg cramps  . Sildenafil     Blue vision    History   Social History  . Marital Status: Married    Spouse Name: N/A    Number of Children: N/A  . Years of Education: N/A   Occupational History  . Not on file.   Social History Main Topics  . Smoking status: Former Smoker    Quit date: 11/13/2005  . Smokeless tobacco: Not on file  . Alcohol Use: Yes      21 drinks/ week  . Drug Use: No  . Sexually Active: Not on file   Other Topics Concern  . Not on file   Social History Narrative  . No narrative on file    Family  History  Problem Relation Age of Onset  . Heart disease Maternal Grandfather     MI @ 40  . Diabetes Father   . Heart disease Mother      AF  . Peripheral vascular disease Mother   . Gout Brother    Physical Exam: Filed Vitals:   01/31/12 1548  BP: 126/78  Pulse: 75  Resp: 18  Height: 6\' 5"  (1.956 m)  Weight: 246 lb (111.585 kg)    GEN- The patient is well appearing, alert and oriented x 3 today.   Head- normocephalic, atraumatic Eyes-  Sclera clear, conjunctiva pink Ears- hearing intact Oropharynx- clear Neck- supple, no JVP Lymph- no cervical lymphadenopathy Lungs- Clear to ausculation bilaterally, normal work of breathing Chest- pacemaker pocket is well healed Heart- Regular rate and rhythm, no murmurs, rubs or gallops, PMI not laterally displaced GI- soft, NT, ND, + BS Extremities- no clubbing, cyanosis, or edema MS- no significant deformity or atrophy Skin- no rash or lesion Psych- euthymic mood, full affect Neuro- strength and sensation are intact  Pacemaker interrogation- reviewed in detail today,   See PACEART report  Assessment and Plan:

## 2012-02-06 ENCOUNTER — Encounter: Payer: Self-pay | Admitting: Internal Medicine

## 2012-02-08 ENCOUNTER — Other Ambulatory Visit: Payer: Self-pay | Admitting: Internal Medicine

## 2012-02-08 NOTE — Telephone Encounter (Signed)
Refill done.  

## 2012-05-09 ENCOUNTER — Encounter: Payer: Self-pay | Admitting: Internal Medicine

## 2012-05-09 ENCOUNTER — Telehealth: Payer: Self-pay | Admitting: *Deleted

## 2012-05-09 ENCOUNTER — Ambulatory Visit (INDEPENDENT_AMBULATORY_CARE_PROVIDER_SITE_OTHER): Payer: BC Managed Care – PPO | Admitting: *Deleted

## 2012-05-09 ENCOUNTER — Telehealth: Payer: Self-pay | Admitting: Cardiology

## 2012-05-09 DIAGNOSIS — I442 Atrioventricular block, complete: Secondary | ICD-10-CM

## 2012-05-09 MED ORDER — PREDNISONE 20 MG PO TABS: ORAL_TABLET | ORAL | Status: DC

## 2012-05-09 NOTE — Telephone Encounter (Signed)
New msg Pt wants to know how to do transmission. Please call

## 2012-05-09 NOTE — Telephone Encounter (Signed)
Prednisone 20 mg dispense # NINE; one half pill 3 times a day with meals. Office visit  if no better

## 2012-05-09 NOTE — Telephone Encounter (Signed)
Pt states that he currently is having a GOUT attack and would like to get a Rx for prednisone sent in to pharmacy..Please advise

## 2012-05-09 NOTE — Telephone Encounter (Signed)
Discuss with patient, Rx sent. 

## 2012-05-09 NOTE — Telephone Encounter (Signed)
Transmission was received. Pt aware to be looking for letter in mail.

## 2012-05-10 ENCOUNTER — Encounter: Payer: Self-pay | Admitting: Internal Medicine

## 2012-05-13 LAB — REMOTE PACEMAKER DEVICE
AL AMPLITUDE: 2.8 mv
AL IMPEDENCE PM: 479 Ohm
ATRIAL PACING PM: 8
BAMS-0001: 150 {beats}/min
BATTERY VOLTAGE: 2.79 V
RV LEAD IMPEDENCE PM: 624 Ohm
VENTRICULAR PACING PM: 81

## 2012-05-27 ENCOUNTER — Encounter: Payer: Self-pay | Admitting: *Deleted

## 2012-07-05 ENCOUNTER — Other Ambulatory Visit: Payer: Self-pay

## 2012-07-05 MED ORDER — ALLOPURINOL 100 MG PO TABS: 300.0000 mg | ORAL_TABLET | Freq: Every day | ORAL | Status: DC

## 2012-07-05 MED ORDER — LOSARTAN POTASSIUM 100 MG PO TABS: ORAL_TABLET | ORAL | Status: DC

## 2012-07-05 NOTE — Telephone Encounter (Signed)
[facility] @ [phone]

## 2012-08-06 ENCOUNTER — Ambulatory Visit: Payer: BC Managed Care – PPO

## 2012-08-12 ENCOUNTER — Encounter: Payer: Self-pay | Admitting: Internal Medicine

## 2012-08-12 ENCOUNTER — Ambulatory Visit (INDEPENDENT_AMBULATORY_CARE_PROVIDER_SITE_OTHER): Payer: BC Managed Care – PPO | Admitting: *Deleted

## 2012-08-12 DIAGNOSIS — I442 Atrioventricular block, complete: Secondary | ICD-10-CM

## 2012-08-13 LAB — REMOTE PACEMAKER DEVICE
ATRIAL PACING PM: 9
BAMS-0001: 150 {beats}/min
RV LEAD IMPEDENCE PM: 612 Ohm
RV LEAD THRESHOLD: 0.5 V

## 2012-08-13 NOTE — Progress Notes (Signed)
Remote pacer check  

## 2012-08-20 ENCOUNTER — Ambulatory Visit (INDEPENDENT_AMBULATORY_CARE_PROVIDER_SITE_OTHER): Payer: BC Managed Care – PPO

## 2012-08-20 DIAGNOSIS — Z23 Encounter for immunization: Secondary | ICD-10-CM

## 2012-08-20 DIAGNOSIS — Z299 Encounter for prophylactic measures, unspecified: Secondary | ICD-10-CM

## 2012-08-20 NOTE — Addendum Note (Signed)
Addended by: Mal Amabile on: 08/20/2012 10:47 AM   Modules accepted: Orders

## 2012-08-21 ENCOUNTER — Encounter: Payer: Self-pay | Admitting: *Deleted

## 2012-08-23 ENCOUNTER — Other Ambulatory Visit: Payer: Self-pay | Admitting: *Deleted

## 2012-08-23 MED ORDER — EZETIMIBE-SIMVASTATIN 10-40 MG PO TABS: 1.0000 | ORAL_TABLET | Freq: Every day | ORAL | Status: DC

## 2012-08-23 NOTE — Telephone Encounter (Signed)
Fax Received. Refill Completed. Neelam Tiggs Chowoe (R.M.A)   

## 2012-08-28 ENCOUNTER — Telehealth: Payer: Self-pay | Admitting: Internal Medicine

## 2012-08-28 NOTE — Telephone Encounter (Signed)
plz return call to Eulis Foster- [facility]  [phone] regarding patient vitorin RX alternatives lovastatin, pravastatin, simvastatin, atorvastatin.  Refer#  [id number]

## 2012-08-28 NOTE — Telephone Encounter (Signed)
His PCP follows his cholesterol

## 2012-10-24 ENCOUNTER — Telehealth: Payer: Self-pay | Admitting: Internal Medicine

## 2012-10-24 ENCOUNTER — Other Ambulatory Visit: Payer: Self-pay | Admitting: Internal Medicine

## 2012-10-24 MED ORDER — SIMVASTATIN 40 MG PO TABS: 40.0000 mg | ORAL_TABLET | Freq: Every day | ORAL | Status: DC

## 2012-10-24 MED ORDER — EZETIMIBE 10 MG PO TABS: 10.0000 mg | ORAL_TABLET | Freq: Every day | ORAL | Status: DC

## 2012-10-24 NOTE — Telephone Encounter (Signed)
Spoke with [facility], changed due to pt cost $150 for 90 day supply for Vytorin. Splitting to Simva and Zetia price drops to $120.

## 2012-10-24 NOTE — Telephone Encounter (Signed)
New Problem:     Called in needing clarification about one of the patient medications.  Think it is possibly the ezetimibe-simvastatin (VYTORIN) 10-40 MG per tablet.  Please call back.

## 2012-11-17 ENCOUNTER — Encounter: Payer: Self-pay | Admitting: Internal Medicine

## 2012-11-18 ENCOUNTER — Ambulatory Visit (INDEPENDENT_AMBULATORY_CARE_PROVIDER_SITE_OTHER): Payer: BC Managed Care – PPO | Admitting: *Deleted

## 2012-11-18 DIAGNOSIS — Z950 Presence of cardiac pacemaker: Secondary | ICD-10-CM

## 2012-11-18 DIAGNOSIS — I442 Atrioventricular block, complete: Secondary | ICD-10-CM

## 2012-11-20 LAB — REMOTE PACEMAKER DEVICE
BAMS-0001: 150 {beats}/min
BATTERY VOLTAGE: 2.79 V
VENTRICULAR PACING PM: 80

## 2012-11-28 ENCOUNTER — Encounter: Payer: Self-pay | Admitting: *Deleted

## 2013-01-13 ENCOUNTER — Ambulatory Visit (INDEPENDENT_AMBULATORY_CARE_PROVIDER_SITE_OTHER): Payer: BC Managed Care – PPO | Admitting: Internal Medicine

## 2013-01-13 ENCOUNTER — Encounter: Payer: Self-pay | Admitting: Internal Medicine

## 2013-01-13 VITALS — BP 151/75 | HR 59 | Wt 249.2 lb

## 2013-01-13 DIAGNOSIS — I442 Atrioventricular block, complete: Secondary | ICD-10-CM

## 2013-01-13 DIAGNOSIS — I10 Essential (primary) hypertension: Secondary | ICD-10-CM

## 2013-01-13 LAB — PACEMAKER DEVICE OBSERVATION
AL AMPLITUDE: 5.6 mv
BAMS-0001: 150 {beats}/min
RV LEAD AMPLITUDE: 2.8 mv
RV LEAD THRESHOLD: 0.5 V

## 2013-01-13 MED ORDER — TADALAFIL 20 MG PO TABS: 20.0000 mg | ORAL_TABLET | Freq: Every day | ORAL | Status: DC | PRN

## 2013-01-13 NOTE — Patient Instructions (Signed)
Your physician wants you to follow-up in: 12 months with Dr Allred You will receive a reminder letter in the mail two months in advance. If you don't receive a letter, please call our office to schedule the follow-up appointment.    Remote monitoring is used to monitor your Pacemaker of ICD from home. This monitoring reduces the number of office visits required to check your device to one time per year. It allows us to keep an eye on the functioning of your device to ensure it is working properly. You are scheduled for a device check from home on 04/14/13. You may send your transmission at any time that day. If you have a wireless device, the transmission will be sent automatically. After your physician reviews your transmission, you will receive a postcard with your next transmission date.   

## 2013-01-13 NOTE — Progress Notes (Signed)
PCP:  Marga Melnick, MD  The patient presents today for routine electrophysiology followup.  Since last being seen in our clinic, the patient reports doing very well.  Today, he denies symptoms of palpitations, chest pain, shortness of breath, orthopnea, PND, lower extremity edema, dizziness, presyncope, syncope, or neurologic sequela.  The patient feels that he is tolerating medications without difficulties and is otherwise without complaint today.   Past Medical History  Diagnosis Date  . Hyperlipidemia   . Coronary artery disease     Non-obstructive. Pt had left heart catheterization in August 2009 showing a 30% mid-circumflex lesion, otherwise coronaries were clean angiographically  . Hypertension   . OSA (obstructive sleep apnea)     Probable  . Complete heart block     S/P  PPM by JA   Past Surgical History  Procedure Laterality Date  . Cardiac catheterization  1993, 2009    2009- negative  . Pacemaker insertion  01/13/11    MDT by Fawn Kirk for complete heart block    Current Outpatient Prescriptions  Medication Sig Dispense Refill  . aspirin 81 MG tablet Take 81 mg by mouth daily.        . clobetasol (TEMOVATE) 0.05 % cream Apply 1 application topically as needed.        . fish oil-omega-3 fatty acids 1000 MG capsule Take 1 g by mouth 2 (two) times daily.        . fluticasone (FLONASE) 50 MCG/ACT nasal spray Place 2 sprays into the nose daily.        . folic acid (FOLVITE) 1 MG tablet Take 1 tablet (1 mg total) by mouth daily.  30 tablet  6  . losartan (COZAAR) 100 MG tablet Take 100 mg by mouth daily.        . meloxicam (MOBIC) 7.5 MG tablet TAKE 1/2 TO 1 TABLET BY MOUTH DAILY AS NEEDED. AVOID DAILY USE DUE TO GI AND    CARDIAC RISKS.  90 tablet  0  . predniSONE (DELTASONE) 20 MG tablet 1/2 by mouth three times daily, OV if no better  9 tablet  0  . tadalafil (CIALIS) 20 MG tablet Take 1 tablet (20 mg total) by mouth daily as needed.  10 tablet  1  . traMADol (ULTRAM) 50 MG tablet  Take 0.5-1 tablets (25-50 mg total) by mouth every 6 (six) hours as needed.  30 tablet  1  . allopurinol (ZYLOPRIM) 100 MG tablet Take 3 tablets (300 mg total) by mouth daily.  90 tablet  1  . allopurinol (ZYLOPRIM) 300 MG tablet TAKE 1 TABLET DAILY  90 tablet  0  . ezetimibe (ZETIA) 10 MG tablet Take 1 tablet (10 mg total) by mouth daily.  90 tablet  1  . simvastatin (ZOCOR) 40 MG tablet Take 1 tablet (40 mg total) by mouth at bedtime.  90 tablet  1   No current facility-administered medications for this visit.    Allergies  Allergen Reactions  . Atorvastatin     Leg cramps  . Ramipril     cough  . Sildenafil     Blue vision    History   Social History  . Marital Status: Married    Spouse Name: N/A    Number of Children: N/A  . Years of Education: N/A   Occupational History  . Not on file.   Social History Main Topics  . Smoking status: Former Smoker    Quit date: 11/13/2005  . Smokeless tobacco: Not  on file  . Alcohol Use: Yes     Comment:  21 drinks/ week  . Drug Use: No  . Sexually Active: Not on file   Other Topics Concern  . Not on file   Social History Narrative  . No narrative on file    Family History  Problem Relation Age of Onset  . Heart disease Maternal Grandfather     MI @ 21  . Diabetes Father   . Heart disease Mother      AF  . Peripheral vascular disease Mother   . Gout Brother    Physical Exam: Filed Vitals:   01/13/13 1247  BP: 151/75  Pulse: 59  Weight: 249 lb 3.2 oz (113.036 kg)    GEN- The patient is well appearing, alert and oriented x 3 today.   Head- normocephalic, atraumatic Eyes-  Sclera clear, conjunctiva pink Ears- hearing intact Oropharynx- clear Neck- supple, no JVP Lymph- no cervical lymphadenopathy Lungs- Clear to ausculation bilaterally, normal work of breathing Chest- pacemaker pocket is well healed Heart- Regular rate and rhythm, no murmurs, rubs or gallops, PMI not laterally displaced GI- soft, NT, ND, +  BS Extremities- no clubbing, cyanosis, or edema  Pacemaker interrogation- reviewed in detail today,  See PACEART report  Assessment and Plan:

## 2013-01-13 NOTE — Assessment & Plan Note (Signed)
Stable No change required today  

## 2013-01-13 NOTE — Assessment & Plan Note (Signed)
AV conduction has improved with PR 310 msec.  I will turn MVP therapy on with P/S AV of 200/180.  Bipolar R waves are decreased to 2.51mV.  Unipolar R waves are 10 mV.  I will therefore program unipolar sensing on with sensitivity of 4 mV. carelink every 3 months

## 2013-02-05 DIAGNOSIS — S61419A Laceration without foreign body of unspecified hand, initial encounter: Secondary | ICD-10-CM | POA: Insufficient documentation

## 2013-03-17 ENCOUNTER — Other Ambulatory Visit: Payer: Self-pay | Admitting: Emergency Medicine

## 2013-03-17 MED ORDER — SIMVASTATIN 40 MG PO TABS: 40.0000 mg | ORAL_TABLET | Freq: Every day | ORAL | Status: DC

## 2013-04-03 ENCOUNTER — Telehealth: Payer: Self-pay | Admitting: Nurse Practitioner

## 2013-04-03 ENCOUNTER — Encounter: Payer: Self-pay | Admitting: Internal Medicine

## 2013-04-03 DIAGNOSIS — E785 Hyperlipidemia, unspecified: Secondary | ICD-10-CM

## 2013-04-03 MED ORDER — SIMVASTATIN 40 MG PO TABS: 40.0000 mg | ORAL_TABLET | Freq: Every day | ORAL | Status: DC

## 2013-04-03 NOTE — Telephone Encounter (Signed)
Sent order to [facility] for 30 day supply of Simvastatin and called patient to let him know that Rx was sent to [facility] on 03/17/13.  I informed patient to check with [facility] and to call us back if they do not have Rx and we will resend.

## 2013-04-14 ENCOUNTER — Other Ambulatory Visit: Payer: Self-pay | Admitting: Internal Medicine

## 2013-04-14 ENCOUNTER — Encounter: Payer: Self-pay | Admitting: Internal Medicine

## 2013-04-14 ENCOUNTER — Ambulatory Visit (INDEPENDENT_AMBULATORY_CARE_PROVIDER_SITE_OTHER): Payer: BC Managed Care – PPO | Admitting: *Deleted

## 2013-04-14 DIAGNOSIS — I442 Atrioventricular block, complete: Secondary | ICD-10-CM

## 2013-04-14 DIAGNOSIS — Z950 Presence of cardiac pacemaker: Secondary | ICD-10-CM

## 2013-04-14 MED ORDER — ALLOPURINOL 300 MG PO TABS: ORAL_TABLET | ORAL | Status: DC

## 2013-04-14 MED ORDER — LOSARTAN POTASSIUM 100 MG PO TABS: ORAL_TABLET | ORAL | Status: DC

## 2013-04-14 NOTE — Telephone Encounter (Signed)
Rx's sent as patient requested

## 2013-04-18 LAB — REMOTE PACEMAKER DEVICE
AL IMPEDENCE PM: 486 Ohm
AL THRESHOLD: 0.75 V
ATRIAL PACING PM: 6
BATTERY VOLTAGE: 2.79 V
RV LEAD AMPLITUDE: 16 mv
RV LEAD IMPEDENCE PM: 624 Ohm

## 2013-05-06 ENCOUNTER — Encounter: Payer: Self-pay | Admitting: Internal Medicine

## 2013-05-07 DIAGNOSIS — M25641 Stiffness of right hand, not elsewhere classified: Secondary | ICD-10-CM | POA: Insufficient documentation

## 2013-05-12 ENCOUNTER — Encounter: Payer: Self-pay | Admitting: *Deleted

## 2013-06-18 ENCOUNTER — Other Ambulatory Visit: Payer: Self-pay

## 2013-06-23 ENCOUNTER — Other Ambulatory Visit: Payer: Self-pay | Admitting: Internal Medicine

## 2013-07-01 ENCOUNTER — Encounter: Payer: Self-pay | Admitting: Internal Medicine

## 2013-07-02 ENCOUNTER — Telehealth: Payer: Self-pay | Admitting: *Deleted

## 2013-07-02 DIAGNOSIS — E785 Hyperlipidemia, unspecified: Secondary | ICD-10-CM

## 2013-07-02 MED ORDER — SIMVASTATIN 40 MG PO TABS: 40.0000 mg | ORAL_TABLET | Freq: Every day | ORAL | Status: DC

## 2013-07-02 NOTE — Telephone Encounter (Signed)
Pharmacy is requesting a refill of simvastatin 40 mg.  However I need to confirm this with the patient. I have called and left a message with the patient wife for him to call our office at his earliest convenience.  Ag cma

## 2013-07-02 NOTE — Telephone Encounter (Signed)
Rx sent to the pharmacy by e-script.  Pt is scheduled for CPE in Oct//AB/CMA

## 2013-07-21 ENCOUNTER — Encounter: Payer: BC Managed Care – PPO | Admitting: *Deleted

## 2013-07-29 ENCOUNTER — Encounter: Payer: Self-pay | Admitting: *Deleted

## 2013-08-06 ENCOUNTER — Ambulatory Visit (INDEPENDENT_AMBULATORY_CARE_PROVIDER_SITE_OTHER): Payer: BC Managed Care – PPO | Admitting: *Deleted

## 2013-08-06 ENCOUNTER — Encounter: Payer: Self-pay | Admitting: Internal Medicine

## 2013-08-06 DIAGNOSIS — Z950 Presence of cardiac pacemaker: Secondary | ICD-10-CM

## 2013-08-06 DIAGNOSIS — I442 Atrioventricular block, complete: Secondary | ICD-10-CM

## 2013-08-09 LAB — REMOTE PACEMAKER DEVICE
AL AMPLITUDE: 2.8 mv
AL IMPEDENCE PM: 486 Ohm
BATTERY VOLTAGE: 2.79 V
RV LEAD AMPLITUDE: 11.2 mv
RV LEAD IMPEDENCE PM: 637 Ohm

## 2013-08-12 ENCOUNTER — Encounter: Payer: Self-pay | Admitting: *Deleted

## 2013-08-26 ENCOUNTER — Ambulatory Visit (INDEPENDENT_AMBULATORY_CARE_PROVIDER_SITE_OTHER): Payer: BC Managed Care – PPO | Admitting: *Deleted

## 2013-08-26 ENCOUNTER — Other Ambulatory Visit (INDEPENDENT_AMBULATORY_CARE_PROVIDER_SITE_OTHER): Payer: BC Managed Care – PPO

## 2013-08-26 DIAGNOSIS — E785 Hyperlipidemia, unspecified: Secondary | ICD-10-CM

## 2013-08-26 DIAGNOSIS — Z23 Encounter for immunization: Secondary | ICD-10-CM

## 2013-08-26 DIAGNOSIS — Z0000 Encounter for general adult medical examination without abnormal findings: Secondary | ICD-10-CM

## 2013-08-26 DIAGNOSIS — I10 Essential (primary) hypertension: Secondary | ICD-10-CM

## 2013-08-26 LAB — BASIC METABOLIC PANEL
CO2: 24 mEq/L (ref 19–32)
Calcium: 9.5 mg/dL (ref 8.4–10.5)
Creatinine, Ser: 1 mg/dL (ref 0.4–1.5)

## 2013-08-26 LAB — CBC WITH DIFFERENTIAL/PLATELET
Basophils Absolute: 0 10*3/uL (ref 0.0–0.1)
HCT: 42.5 % (ref 39.0–52.0)
Hemoglobin: 14.3 g/dL (ref 13.0–17.0)
Lymphs Abs: 1.6 10*3/uL (ref 0.7–4.0)
MCHC: 33.6 g/dL (ref 30.0–36.0)
MCV: 100.5 fl — ABNORMAL HIGH (ref 78.0–100.0)
Monocytes Relative: 8.8 % (ref 3.0–12.0)
Neutro Abs: 2.5 10*3/uL (ref 1.4–7.7)
RDW: 13.8 % (ref 11.5–14.6)

## 2013-08-26 LAB — LIPID PANEL
HDL: 52.9 mg/dL (ref >39.00)
Total CHOL/HDL Ratio: 3
Triglycerides: 187 mg/dL — ABNORMAL HIGH (ref 0.0–149.0)

## 2013-08-26 LAB — HEPATIC FUNCTION PANEL
Albumin: 4.1 g/dL (ref 3.5–5.2)
Bilirubin, Direct: 0 mg/dL (ref 0.0–0.3)
Total Protein: 6.8 g/dL (ref 6.0–8.3)

## 2013-08-29 ENCOUNTER — Ambulatory Visit: Payer: BC Managed Care – PPO

## 2013-08-29 ENCOUNTER — Telehealth: Payer: Self-pay

## 2013-08-29 DIAGNOSIS — R7309 Other abnormal glucose: Secondary | ICD-10-CM

## 2013-08-29 LAB — HEMOGLOBIN A1C: Hgb A1c MFr Bld: 5.6 % (ref 4.6–6.5)

## 2013-08-29 NOTE — Telephone Encounter (Addendum)
Unable to reach prior to visit   Immunizations due: flu  A/P:  LAST: PSA: Not on file   CCS: UTD  12/2010 DM:  UTD 08/2013 Eye Exam:   HTN: Due  Lipids: UTD 08/2013  Recent family history or surgical procedures:   To Discuss with Provider: Labs already-drawn 08/26/2013

## 2013-09-02 ENCOUNTER — Encounter: Payer: Self-pay | Admitting: Internal Medicine

## 2013-09-02 ENCOUNTER — Ambulatory Visit (INDEPENDENT_AMBULATORY_CARE_PROVIDER_SITE_OTHER): Payer: BC Managed Care – PPO | Admitting: Internal Medicine

## 2013-09-02 VITALS — BP 146/88 | HR 73 | Temp 98.0°F | Resp 18 | Ht 77.0 in | Wt 248.0 lb

## 2013-09-02 DIAGNOSIS — E785 Hyperlipidemia, unspecified: Secondary | ICD-10-CM

## 2013-09-02 DIAGNOSIS — M720 Palmar fascial fibromatosis [Dupuytren]: Secondary | ICD-10-CM | POA: Insufficient documentation

## 2013-09-02 DIAGNOSIS — Z23 Encounter for immunization: Secondary | ICD-10-CM

## 2013-09-02 DIAGNOSIS — Z0000 Encounter for general adult medical examination without abnormal findings: Secondary | ICD-10-CM

## 2013-09-02 DIAGNOSIS — R51 Headache: Secondary | ICD-10-CM

## 2013-09-02 DIAGNOSIS — G4733 Obstructive sleep apnea (adult) (pediatric): Secondary | ICD-10-CM

## 2013-09-02 MED ORDER — SIMVASTATIN 40 MG PO TABS: 40.0000 mg | ORAL_TABLET | Freq: Every day | ORAL | Status: DC

## 2013-09-02 MED ORDER — ALLOPURINOL 300 MG PO TABS: ORAL_TABLET | ORAL | Status: DC

## 2013-09-02 MED ORDER — PNEUMOCOCCAL VAC POLYVALENT 25 MCG/0.5ML IJ INJ: 0.5000 mL | INJECTION | Freq: Once | INTRAMUSCULAR | Status: DC

## 2013-09-02 MED ORDER — TADALAFIL 20 MG PO TABS: ORAL_TABLET | ORAL | Status: DC

## 2013-09-02 MED ORDER — LOSARTAN POTASSIUM 100 MG PO TABS: ORAL_TABLET | ORAL | Status: DC

## 2013-09-02 NOTE — Progress Notes (Signed)
  Subjective:    Patient ID: [name], male    DOB: [birth year], 67 y.o.   MRN: [id number]  HPI He is here for a physical;acute issues include headaches.     Review of Systems   Headaches began approximately 2 months ago as sharp, up to level V pain in either occipital area. This typically occurs in the context of driving or using the computer. Advil does result in resolution within an hour.  He did have an eye exam but this was in June, approximately 2 months prior to the onset of the headaches.     Objective:   Physical Exam Gen.: Healthy and well-nourished in appearance. Alert, appropriate and cooperative throughout exam.Appears younger than stated age  Head: Normocephalic without obvious abnormalities; no alopecia  Eyes: No corneal or conjunctival inflammation noted. Pupils equal round reactive to light and accommodation. FOV normal. Extraocular motion intact. Vision grossly normal without lenses Ears: External  ear exam reveals no significant lesions or deformities. Canals clear .TMs normal. Hearing is grossly normal bilaterally. Nose: External nasal exam reveals no deformity or inflammation. Nasal mucosa are pink and moist. No lesions or exudates noted.   Mouth: Oral mucosa and oropharynx reveal no lesions or exudates. Teeth in good repair. Neck: No deformities, masses, or tenderness noted. Range of motion decreased. Thyroid normal. Lungs: Normal respiratory effort; chest expands symmetrically. Lungs are clear to auscultation without rales, wheezes, or increased work of breathing. Heart: Normal rate and rhythm. Normal S1 and S2. No gallop, click, or rub.No murmur. Abdomen: Bowel sounds normal; abdomen soft and nontender. No masses, organomegaly or hernias noted. Genitalia: Genitalia normal except for left varices & R granuloma. Prostate is normal without enlargement, asymmetry, nodularity, or induration.                                   Musculoskeletal/extremities:    Accentuated curvature of lower thoracic spine. No clubbing, cyanosis, edema, or significant extremity  deformity noted. Range of motion normal .Tone & strength  Normal. Joints normal . Nail health good. Post op palm changes , R scarring > L Able to lie down & sit up w/o help. Negative SLR bilaterally Vascular: Carotid, radial artery, dorsalis pedis and  posterior tibial pulses are full and equal. No bruits present. Neurologic: Alert and oriented x3. Deep tendon reflexes symmetrical but 1/2+ @ knees         Skin: Intact without suspicious lesions or rashes. Lymph: No cervical, axillary, or inguinal lymphadenopathy present. Psych: Mood and affect are normal. Normally interactive                                                                                        Assessment & Plan:  #1 comprehensive physical exam; no acute findings #2 occipital headaches, R/O ophthalmologic etiology #3 probable sleep apnea; Sleep evaluation recommended  Plan: see Orders  & Recommendations

## 2013-09-02 NOTE — Patient Instructions (Addendum)
I recommend both Ophthalmology & Sleep Medicine  consultations to determine optimal therapy. Minimal Blood Pressure Goal= AVERAGE < 140/90;  Ideal is an AVERAGE < 135/85. This AVERAGE should be calculated from @ least 5-7 BP readings taken @ different times of day on different days of week. You should not respond to isolated BP readings , but rather the AVERAGE for that week .Please bring your  blood pressure cuff to office visits to verify that it is reliable.It  can also be checked against the blood pressure device at the pharmacy. Finger or wrist cuffs are not dependable; an arm cuff is.

## 2013-09-02 NOTE — Addendum Note (Signed)
Addended by: Amado Coe A on: 09/02/2013 11:02 AM   Modules accepted: Orders

## 2013-11-10 ENCOUNTER — Encounter: Payer: BC Managed Care – PPO | Admitting: *Deleted

## 2013-11-20 ENCOUNTER — Encounter: Payer: Self-pay | Admitting: *Deleted

## 2013-11-21 ENCOUNTER — Encounter: Payer: BC Managed Care – PPO | Admitting: *Deleted

## 2013-11-21 ENCOUNTER — Encounter: Payer: Self-pay | Admitting: Internal Medicine

## 2013-11-21 DIAGNOSIS — I442 Atrioventricular block, complete: Secondary | ICD-10-CM

## 2013-11-21 LAB — MDC_IDC_ENUM_SESS_TYPE_REMOTE
Battery Remaining Longevity: 117 mo
Battery Voltage: 2.79 V
Brady Statistic AP VP Percent: 5 %
Brady Statistic AS VS Percent: 13 %
Date Time Interrogation Session: 20150109114330
Lead Channel Impedance Value: 516 Ohm
Lead Channel Impedance Value: 639 Ohm
Lead Channel Pacing Threshold Amplitude: 0.5 V
Lead Channel Pacing Threshold Pulse Width: 0.4 ms
Lead Channel Pacing Threshold Pulse Width: 0.4 ms
Lead Channel Setting Pacing Pulse Width: 0.4 ms
Lead Channel Setting Sensing Sensitivity: 2.8 mV
MDC IDC MSMT BATTERY IMPEDANCE: 205 Ohm
MDC IDC MSMT LEADCHNL RA PACING THRESHOLD AMPLITUDE: 0.75 V
MDC IDC MSMT LEADCHNL RA SENSING INTR AMPL: 2.8 mV
MDC IDC SET LEADCHNL RA PACING AMPLITUDE: 2 V
MDC IDC SET LEADCHNL RV PACING AMPLITUDE: 2.5 V
MDC IDC STAT BRADY AP VS PERCENT: 1 %
MDC IDC STAT BRADY AS VP PERCENT: 80 %

## 2013-12-08 ENCOUNTER — Encounter: Payer: Self-pay | Admitting: *Deleted

## 2013-12-12 ENCOUNTER — Encounter: Payer: Self-pay | Admitting: Internal Medicine

## 2014-02-09 ENCOUNTER — Other Ambulatory Visit: Payer: Self-pay

## 2014-02-09 ENCOUNTER — Ambulatory Visit (INDEPENDENT_AMBULATORY_CARE_PROVIDER_SITE_OTHER): Payer: BC Managed Care – PPO | Admitting: Internal Medicine

## 2014-02-09 ENCOUNTER — Encounter: Payer: Self-pay | Admitting: Internal Medicine

## 2014-02-09 VITALS — BP 136/78 | HR 92 | Ht 77.0 in | Wt 243.6 lb

## 2014-02-09 DIAGNOSIS — I10 Essential (primary) hypertension: Secondary | ICD-10-CM

## 2014-02-09 DIAGNOSIS — I059 Rheumatic mitral valve disease, unspecified: Secondary | ICD-10-CM

## 2014-02-09 DIAGNOSIS — I471 Supraventricular tachycardia: Secondary | ICD-10-CM

## 2014-02-09 DIAGNOSIS — I442 Atrioventricular block, complete: Secondary | ICD-10-CM

## 2014-02-09 LAB — MDC_IDC_ENUM_SESS_TYPE_INCLINIC
Battery Voltage: 2.79 V
Brady Statistic AP VS Percent: 1 %
Brady Statistic AS VP Percent: 83 %
Brady Statistic AS VS Percent: 10 %
Date Time Interrogation Session: 20150330145221
Lead Channel Impedance Value: 515 Ohm
Lead Channel Impedance Value: 622 Ohm
Lead Channel Pacing Threshold Amplitude: 0.5 V
Lead Channel Pacing Threshold Amplitude: 0.75 V
Lead Channel Pacing Threshold Pulse Width: 0.4 ms
Lead Channel Sensing Intrinsic Amplitude: 4 mV
Lead Channel Setting Pacing Amplitude: 2 V
Lead Channel Setting Pacing Pulse Width: 0.4 ms
MDC IDC MSMT BATTERY IMPEDANCE: 205 Ohm
MDC IDC MSMT BATTERY REMAINING LONGEVITY: 116 mo
MDC IDC MSMT LEADCHNL RA PACING THRESHOLD PULSEWIDTH: 0.4 ms
MDC IDC SET LEADCHNL RV PACING AMPLITUDE: 2.5 V
MDC IDC SET LEADCHNL RV SENSING SENSITIVITY: 2.8 mV
MDC IDC STAT BRADY AP VP PERCENT: 6 %

## 2014-02-09 NOTE — Patient Instructions (Signed)
Your physician wants you to follow-up in: 12 months with Dr Vallery Ridge will receive a reminder letter in the mail two months in advance. If you don't receive a letter, please call our office to schedule the follow-up appointment.   Send transmission when you receive box at home and then send again in 1 month after the first transmission

## 2014-02-09 NOTE — Progress Notes (Signed)
PCP:  Unice Cobble, MD  The patient presents today for routine electrophysiology followup.  Since last being seen in our clinic, the patient reports doing very well.  He enjoys playing golf regularly.   Today, he denies symptoms of palpitations, chest pain, shortness of breath, orthopnea, PND, lower extremity edema, dizziness, presyncope, syncope, or neurologic sequela.  The patient feels that he is tolerating medications without difficulties and is otherwise without complaint today.   Past Medical History  Diagnosis Date  . Hyperlipidemia   . Coronary artery disease     Non-obstructive. Pt had left heart catheterization in August 2009 showing a 30% mid-circumflex lesion, otherwise coronaries were clean angiographically  . Hypertension   . OSA (obstructive sleep apnea)     Probable; study never scheduled  . Complete heart block     S/P  PPM by Dr Rayann Heman   Past Surgical History  Procedure Laterality Date  . Cardiac catheterization  1993, 2009    2009- negative  . Pacemaker insertion  01/13/11    MDT by Greggory Brandy for complete heart block  . Colonoscopy  2011    negative, [facility]    Current Outpatient Prescriptions  Medication Sig Dispense Refill  . allopurinol (ZYLOPRIM) 300 MG tablet Take 1 tablet by mouth daily      . aspirin 81 MG tablet Take 81 mg by mouth every other day.       . clobetasol (TEMOVATE) 0.05 % cream Apply 1 application topically as needed.        . ezetimibe (ZETIA) 10 MG tablet Take 1 tablet (10 mg total) by mouth daily.  90 tablet  1  . fish oil-omega-3 fatty acids 1000 MG capsule Take 1 g by mouth 2 (two) times daily.        . fluticasone (FLONASE) 50 MCG/ACT nasal spray Place 2 sprays into the nose as needed.       Marland Kitchen losartan (COZAAR) 100 MG tablet Take 1 tablet by mouth daily      . meloxicam (MOBIC) 7.5 MG tablet TAKE 1/2 TO 1 TABLET BY MOUTH DAILY AS NEEDED. AVOID DAILY USE DUE TO GI AND    CARDIAC RISKS.  90 tablet  0  . simvastatin (ZOCOR) 40 MG tablet Take 1  tablet (40 mg total) by mouth at bedtime.  90 tablet  3  . tadalafil (CIALIS) 20 MG tablet 1 every 3 days prn only  10 tablet  3   No current facility-administered medications for this visit.    Allergies  Allergen Reactions  . Atorvastatin     Leg cramps  . Ramipril     cough  . Sildenafil     Blue vision    History   Social History  . Marital Status: Married    Spouse Name: N/A    Number of Children: N/A  . Years of Education: N/A   Occupational History  . Not on file.   Social History Main Topics  . Smoking status: Former Smoker    Quit date: 11/13/2005  . Smokeless tobacco: Not on file     Comment: smoked 1965- 2007, up to 2 ppd  . Alcohol Use: Yes     Comment:  21 drinks/ week  . Drug Use: No  . Sexual Activity: Not on file   Other Topics Concern  . Not on file   Social History Narrative  . No narrative on file    Family History  Problem Relation Age of Onset  .  Heart attack Maternal Grandfather 56  . Diabetes Father   . Heart disease Mother      AF  . Peripheral vascular disease Mother   . Gout Brother   . Stroke Neg Hx   . Cancer Mother     ? primary   Physical Exam: Filed Vitals:   02/09/14 1349  BP: 136/78  Pulse: 92  Height: 6\' 5"  (1.956 m)  Weight: 243 lb 9.6 oz (110.496 kg)    GEN- The patient is well appearing, alert and oriented x 3 today.   Head- normocephalic, atraumatic Eyes-  Sclera clear, conjunctiva pink Ears- hearing intact Oropharynx- clear Neck- supple, no JVP Lymph- no cervical lymphadenopathy Lungs- Clear to ausculation bilaterally, normal work of breathing Chest- pacemaker pocket is well healed Heart- Regular rate and rhythm, no murmurs, rubs or gallops, PMI not laterally displaced GI- soft, NT, ND, + BS Extremities- no clubbing, cyanosis, or edema  Pacemaker interrogation- reviewed in detail today,  See PACEART report  Assessment and Plan:  1. Complete heart block Continues to V pace 90 % with MVP on . I  will therefore reprogram DDD today with fixed AV delays His bipolar R waves are low.  He has done well with unipolar sensing. We will enroll in the [place] with [employer]  2. HTN Stable No change required today  carelink Return in 1 year

## 2014-02-27 ENCOUNTER — Telehealth: Payer: Self-pay | Admitting: Internal Medicine

## 2014-02-27 NOTE — Telephone Encounter (Signed)
New message     Need medtr[employer] rep to send emails again regarding how to use cell phone ?????????did not know what he was talking about.   Send to this email address--[email or web address]

## 2014-02-27 NOTE — Telephone Encounter (Signed)
Email re-sent

## 2014-05-11 ENCOUNTER — Telehealth: Payer: Self-pay | Admitting: Cardiology

## 2014-05-11 ENCOUNTER — Ambulatory Visit (INDEPENDENT_AMBULATORY_CARE_PROVIDER_SITE_OTHER): Payer: BC Managed Care – PPO | Admitting: *Deleted

## 2014-05-11 DIAGNOSIS — I442 Atrioventricular block, complete: Secondary | ICD-10-CM

## 2014-05-11 NOTE — Progress Notes (Signed)
Remote pacemaker transmission.   

## 2014-05-11 NOTE — Telephone Encounter (Signed)
LMOVM reminding pt to send remote transmission.   

## 2014-05-13 LAB — MDC_IDC_ENUM_SESS_TYPE_REMOTE
Battery Impedance: 229 Ohm
Battery Remaining Longevity: 111 mo
Battery Voltage: 2.79 V
Brady Statistic AP VP Percent: 4 %
Brady Statistic AP VS Percent: 0 %
Brady Statistic AS VP Percent: 96 %
Brady Statistic AS VS Percent: 0 %
Date Time Interrogation Session: 20150629152112
Lead Channel Impedance Value: 507 Ohm
Lead Channel Pacing Threshold Amplitude: 0.625 V
Lead Channel Pacing Threshold Pulse Width: 0.4 ms
Lead Channel Pacing Threshold Pulse Width: 0.4 ms
Lead Channel Sensing Intrinsic Amplitude: 2.8 mV
Lead Channel Setting Pacing Amplitude: 2.5 V
Lead Channel Setting Pacing Pulse Width: 0.4 ms
MDC IDC MSMT LEADCHNL RV IMPEDANCE VALUE: 635 Ohm
MDC IDC MSMT LEADCHNL RV PACING THRESHOLD AMPLITUDE: 0.5 V
MDC IDC SET LEADCHNL RA PACING AMPLITUDE: 2 V
MDC IDC SET LEADCHNL RV SENSING SENSITIVITY: 2.8 mV

## 2014-05-20 ENCOUNTER — Encounter: Payer: Self-pay | Admitting: Cardiology

## 2014-05-22 ENCOUNTER — Encounter: Payer: Self-pay | Admitting: Cardiology

## 2014-05-25 ENCOUNTER — Encounter: Payer: Self-pay | Admitting: Internal Medicine

## 2014-06-22 ENCOUNTER — Other Ambulatory Visit: Payer: Self-pay | Admitting: Internal Medicine

## 2014-06-23 NOTE — Telephone Encounter (Signed)
OK #10, R X1

## 2014-07-19 ENCOUNTER — Other Ambulatory Visit: Payer: Self-pay | Admitting: Internal Medicine

## 2014-08-10 ENCOUNTER — Ambulatory Visit (INDEPENDENT_AMBULATORY_CARE_PROVIDER_SITE_OTHER): Payer: BC Managed Care – PPO | Admitting: *Deleted

## 2014-08-10 ENCOUNTER — Encounter: Payer: Self-pay | Admitting: Internal Medicine

## 2014-08-10 DIAGNOSIS — I442 Atrioventricular block, complete: Secondary | ICD-10-CM

## 2014-08-10 LAB — MDC_IDC_ENUM_SESS_TYPE_REMOTE
Battery Impedance: 229 Ohm
Battery Remaining Longevity: 110 mo
Brady Statistic AP VS Percent: 0 %
Brady Statistic AS VS Percent: 0 %
Date Time Interrogation Session: 20150928142257
Lead Channel Impedance Value: 514 Ohm
Lead Channel Pacing Threshold Pulse Width: 0.4 ms
Lead Channel Pacing Threshold Pulse Width: 0.4 ms
Lead Channel Setting Pacing Amplitude: 2 V
Lead Channel Setting Pacing Amplitude: 2.5 V
Lead Channel Setting Sensing Sensitivity: 2.8 mV
MDC IDC MSMT BATTERY VOLTAGE: 2.79 V
MDC IDC MSMT LEADCHNL RA PACING THRESHOLD AMPLITUDE: 0.875 V
MDC IDC MSMT LEADCHNL RA SENSING INTR AMPL: 2.8 mV
MDC IDC MSMT LEADCHNL RV IMPEDANCE VALUE: 593 Ohm
MDC IDC MSMT LEADCHNL RV PACING THRESHOLD AMPLITUDE: 0.5 V
MDC IDC SET LEADCHNL RV PACING PULSEWIDTH: 0.4 ms
MDC IDC STAT BRADY AP VP PERCENT: 3 %
MDC IDC STAT BRADY AS VP PERCENT: 97 %

## 2014-08-10 NOTE — Progress Notes (Signed)
Remote pacemaker transmission.   

## 2014-08-19 ENCOUNTER — Encounter: Payer: Self-pay | Admitting: Cardiology

## 2014-09-08 ENCOUNTER — Encounter: Payer: Self-pay | Admitting: Internal Medicine

## 2014-09-08 ENCOUNTER — Ambulatory Visit (INDEPENDENT_AMBULATORY_CARE_PROVIDER_SITE_OTHER): Payer: BC Managed Care – PPO | Admitting: Internal Medicine

## 2014-09-08 VITALS — BP 124/78 | HR 76 | Temp 98.2°F | Resp 14 | Ht 77.0 in | Wt 243.2 lb

## 2014-09-08 DIAGNOSIS — Z23 Encounter for immunization: Secondary | ICD-10-CM

## 2014-09-08 DIAGNOSIS — Z8639 Personal history of other endocrine, nutritional and metabolic disease: Secondary | ICD-10-CM

## 2014-09-08 DIAGNOSIS — I10 Essential (primary) hypertension: Secondary | ICD-10-CM

## 2014-09-08 DIAGNOSIS — Z8739 Personal history of other diseases of the musculoskeletal system and connective tissue: Secondary | ICD-10-CM

## 2014-09-08 DIAGNOSIS — R739 Hyperglycemia, unspecified: Secondary | ICD-10-CM

## 2014-09-08 DIAGNOSIS — E785 Hyperlipidemia, unspecified: Secondary | ICD-10-CM

## 2014-09-08 DIAGNOSIS — Z0000 Encounter for general adult medical examination without abnormal findings: Secondary | ICD-10-CM

## 2014-09-08 MED ORDER — SIMVASTATIN 40 MG PO TABS: ORAL_TABLET | ORAL | Status: DC

## 2014-09-08 MED ORDER — LOSARTAN POTASSIUM 100 MG PO TABS: ORAL_TABLET | ORAL | Status: DC

## 2014-09-08 MED ORDER — ALLOPURINOL 300 MG PO TABS: ORAL_TABLET | ORAL | Status: DC

## 2014-09-08 NOTE — Progress Notes (Signed)
Pre visit review using our clinic review tool, if applicable. No additional management support is needed unless otherwise documented below in the visit note. 

## 2014-09-08 NOTE — Patient Instructions (Signed)
Your next office appointment will be determined based upon review of your pending labs . Those instructions will be transmitted to you through My Chart. Followup as needed for your acute issue. Please report any significant change in your symptoms.  Please keep a diary of your headaches . Document  each occurrence on the calendar with notation of : #1 any prodrome ( any non headache symptom such as marked fatigue,visual changes, ,etc ) which precedes actual headache ; #2) severity on 1-10 scale; #3) any triggers ( food/ drink,enviromenntal or weather changes ,physical or emotional stress) in 8-12 hour period prior to the headache; & #4) response to any medications or other intervention. Please review "Headache" @ [facility] for additional information.   

## 2014-09-08 NOTE — Progress Notes (Signed)
   Subjective:    Patient ID: [name], male    DOB: 03/10/1946, 68 y.o.   MRN: [id number]  HPI  He is here for a physical;acute issues include intermittent R occipital headache for 6 months. NSAIDs relieve the headache..  He does have occasional dizziness or weakness with subjective palpitations. Transmission of cardiac rhythm recordings revealed no significant dysrhythmias  He is on a modified heart healthy diet with low sodium intake.  He is not monitoring his blood pressure at home.  He has decreased his exercise since the Summer due to the heat.  His wife describes snoring; sleep study has never been completed; he did not wish to pursue it.    Review of Systems  He denies chest pain, claudication, edema, or paroxysmal nocturnal dyspnea  He also denies significant memory loss, myalgias, or abdominal symptoms.     Objective:   Physical Exam Gen.: Healthy and well-nourished in appearance. Alert, appropriate and cooperative throughout exam. Appears younger than stated age  Head: Normocephalic without obvious abnormalities; no alopecia  Eyes: No corneal or conjunctival inflammation noted. Pupils equal round reactive to light and accommodation. Extraocular motion intact. Ptosis OS > OD Ears: External  ear exam reveals no significant lesions or deformities. Canals clear .TMs normal. Hearing is grossly normal bilaterally. Nose: External nasal exam reveals no deformity or inflammation. Nasal mucosa are pink and moist. No lesions or exudates noted.   Mouth: Oral mucosa and oropharynx reveal no lesions or exudates. Teeth in good repair. Neck: No deformities, masses, or tenderness noted. Range of motion slightly decreased. Thyroid normal. Lungs: Normal respiratory effort; chest expands symmetrically. Lungs are clear to auscultation without rales, wheezes, or increased work of breathing. Heart: Normal rate and rhythm. Normal S1 and S2. No gallop, click, or rub. No murmur. Abdomen:  Bowel sounds normal; abdomen soft and nontender. No masses, organomegaly or hernias noted. Genitalia:Genitalia normal except for left varices. Prostate is normal without enlargement, asymmetry, nodularity, or induration                                 Musculoskeletal/extremities: No deformity or scoliosis noted of  the thoracic or lumbar spine.  No clubbing, cyanosis or edema noted. Dupuytren's bilaterally. Range of motion normal .Tone & strength normal  Fingernail  health good. Able to lie down & sit up w/o help. Negative SLR bilaterally Vascular: Carotid, radial artery, dorsalis pedis and  posterior tibial pulses are full and equal. No bruits present. Neurologic: Alert and oriented x3. Deep tendon reflexes symmetrical and normal. Cranial nerve exam WNL. Gait normal .       Skin: Intact without suspicious lesions or rashes. Lymph: No cervical, axillary, or inguinal lymphadenopathy present. Psych: Mood and affect are normal. Normally interactive                                                                                        Assessment & Plan:  #1 comprehensive physical exam; no acute findings  Plan: see Orders  & Recommendations

## 2014-09-09 ENCOUNTER — Other Ambulatory Visit (INDEPENDENT_AMBULATORY_CARE_PROVIDER_SITE_OTHER): Payer: BC Managed Care – PPO

## 2014-09-09 ENCOUNTER — Telehealth: Payer: Self-pay | Admitting: Internal Medicine

## 2014-09-09 ENCOUNTER — Other Ambulatory Visit: Payer: Self-pay | Admitting: Internal Medicine

## 2014-09-09 DIAGNOSIS — Z0189 Encounter for other specified special examinations: Secondary | ICD-10-CM

## 2014-09-09 DIAGNOSIS — Z0000 Encounter for general adult medical examination without abnormal findings: Secondary | ICD-10-CM

## 2014-09-09 LAB — CBC WITH DIFFERENTIAL/PLATELET
Basophils Absolute: 0 10*3/uL (ref 0.0–0.1)
Basophils Relative: 0.6 % (ref 0.0–3.0)
EOS PCT: 4.2 % (ref 0.0–5.0)
Eosinophils Absolute: 0.2 10*3/uL (ref 0.0–0.7)
HEMATOCRIT: 43 % (ref 39.0–52.0)
Hemoglobin: 14.3 g/dL (ref 13.0–17.0)
LYMPHS ABS: 1.4 10*3/uL (ref 0.7–4.0)
Lymphocytes Relative: 26.4 % (ref 12.0–46.0)
MCHC: 33.3 g/dL (ref 30.0–36.0)
MCV: 100.3 fl — ABNORMAL HIGH (ref 78.0–100.0)
Monocytes Absolute: 0.5 10*3/uL (ref 0.1–1.0)
Monocytes Relative: 8.5 % (ref 3.0–12.0)
NEUTROS PCT: 60.3 % (ref 43.0–77.0)
Neutro Abs: 3.2 10*3/uL (ref 1.4–7.7)
Platelets: 169 10*3/uL (ref 150.0–400.0)
RBC: 4.29 Mil/uL (ref 4.22–5.81)
RDW: 14.3 % (ref 11.5–15.5)
WBC: 5.3 10*3/uL (ref 4.0–10.5)

## 2014-09-09 LAB — HEPATIC FUNCTION PANEL
ALK PHOS: 41 U/L (ref 39–117)
ALT: 47 U/L (ref 0–53)
AST: 51 U/L — ABNORMAL HIGH (ref 0–37)
Albumin: 3.8 g/dL (ref 3.5–5.2)
BILIRUBIN DIRECT: 0.2 mg/dL (ref 0.0–0.3)
BILIRUBIN TOTAL: 1.2 mg/dL (ref 0.2–1.2)
Total Protein: 6.9 g/dL (ref 6.0–8.3)

## 2014-09-09 LAB — BASIC METABOLIC PANEL
BUN: 19 mg/dL (ref 6–23)
CALCIUM: 9.4 mg/dL (ref 8.4–10.5)
CO2: 24 mEq/L (ref 19–32)
CREATININE: 1.2 mg/dL (ref 0.4–1.5)
Chloride: 105 mEq/L (ref 96–112)
GFR: 67.48 mL/min (ref >60.00)
Glucose, Bld: 113 mg/dL — ABNORMAL HIGH (ref 70–99)
Potassium: 4 mEq/L (ref 3.5–5.1)
Sodium: 139 mEq/L (ref 135–145)

## 2014-09-09 LAB — TSH: TSH: 1.73 u[IU]/mL (ref 0.35–4.50)

## 2014-09-09 LAB — HEMOGLOBIN A1C: Hgb A1c MFr Bld: 5.5 % (ref 4.6–6.5)

## 2014-09-09 LAB — URIC ACID: Uric Acid, Serum: 4.1 mg/dL (ref 4.0–7.8)

## 2014-09-09 NOTE — Telephone Encounter (Signed)
emmi emailed °

## 2014-09-11 LAB — NMR LIPOPROFILE WITH LIPIDS
CHOLESTEROL, TOTAL: 192 mg/dL (ref 100–199)
HDL Particle Number: 47 umol/L (ref >=30.5)
HDL Size: 9.1 nm — ABNORMAL LOW (ref >=9.2)
HDL-C: 56 mg/dL (ref >39)
LDL CALC: 102 mg/dL — AB (ref 0–99)
LDL Particle Number: 1522 nmol/L — ABNORMAL HIGH (ref <1000)
LDL Size: 20.2 nm (ref >=20.8)
LP-IR SCORE: 66 — AB (ref <=45)
Large HDL-P: 5.5 umol/L (ref >=4.8)
Large VLDL-P: 9.7 nmol/L — ABNORMAL HIGH (ref <=2.7)
Small LDL Particle Number: 921 nmol/L — ABNORMAL HIGH (ref <=527)
TRIGLYCERIDES: 171 mg/dL — AB (ref 0–149)
VLDL SIZE: 55.3 nm — AB (ref <=46.6)

## 2014-09-13 ENCOUNTER — Other Ambulatory Visit: Payer: Self-pay | Admitting: Internal Medicine

## 2014-09-13 DIAGNOSIS — R748 Abnormal levels of other serum enzymes: Secondary | ICD-10-CM | POA: Insufficient documentation

## 2014-09-13 DIAGNOSIS — Z8739 Personal history of other diseases of the musculoskeletal system and connective tissue: Secondary | ICD-10-CM

## 2014-09-16 ENCOUNTER — Encounter: Payer: Self-pay | Admitting: Cardiology

## 2014-09-22 ENCOUNTER — Other Ambulatory Visit: Payer: Self-pay | Admitting: Internal Medicine

## 2014-09-22 NOTE — Telephone Encounter (Signed)
OK ; R X 3

## 2014-09-30 ENCOUNTER — Other Ambulatory Visit: Payer: Self-pay | Admitting: Internal Medicine

## 2014-11-11 ENCOUNTER — Ambulatory Visit (INDEPENDENT_AMBULATORY_CARE_PROVIDER_SITE_OTHER): Payer: BC Managed Care – PPO | Admitting: *Deleted

## 2014-11-11 ENCOUNTER — Encounter: Payer: Self-pay | Admitting: Internal Medicine

## 2014-11-11 DIAGNOSIS — I442 Atrioventricular block, complete: Secondary | ICD-10-CM

## 2014-11-11 NOTE — Progress Notes (Signed)
Remote pacemaker transmission.   

## 2014-11-12 LAB — MDC_IDC_ENUM_SESS_TYPE_REMOTE
Battery Impedance: 229 Ohm
Battery Remaining Longevity: 111 mo
Battery Voltage: 2.79 V
Brady Statistic AP VP Percent: 4 %
Brady Statistic AS VP Percent: 96 %
Lead Channel Impedance Value: 524 Ohm
Lead Channel Pacing Threshold Amplitude: 0.75 V
Lead Channel Sensing Intrinsic Amplitude: 2.8 mV
Lead Channel Setting Pacing Amplitude: 2 V
Lead Channel Setting Pacing Amplitude: 2.5 V
Lead Channel Setting Pacing Pulse Width: 0.4 ms
MDC IDC MSMT LEADCHNL RA PACING THRESHOLD PULSEWIDTH: 0.4 ms
MDC IDC MSMT LEADCHNL RV IMPEDANCE VALUE: 654 Ohm
MDC IDC MSMT LEADCHNL RV PACING THRESHOLD AMPLITUDE: 0.5 V
MDC IDC MSMT LEADCHNL RV PACING THRESHOLD PULSEWIDTH: 0.4 ms
MDC IDC SESS DTM: 20151230133906
MDC IDC SET LEADCHNL RV SENSING SENSITIVITY: 2.8 mV
MDC IDC STAT BRADY AP VS PERCENT: 0 %
MDC IDC STAT BRADY AS VS PERCENT: 0 %

## 2014-11-20 ENCOUNTER — Encounter: Payer: Self-pay | Admitting: *Deleted

## 2014-12-24 ENCOUNTER — Encounter: Payer: Self-pay | Admitting: Internal Medicine

## 2014-12-24 DIAGNOSIS — I10 Essential (primary) hypertension: Secondary | ICD-10-CM

## 2014-12-25 MED ORDER — SIMVASTATIN 40 MG PO TABS: 40.0000 mg | ORAL_TABLET | Freq: Every day | ORAL | Status: DC

## 2014-12-25 MED ORDER — ALLOPURINOL 300 MG PO TABS: 300.0000 mg | ORAL_TABLET | Freq: Every day | ORAL | Status: DC

## 2014-12-25 MED ORDER — LOSARTAN POTASSIUM 100 MG PO TABS: ORAL_TABLET | ORAL | Status: DC

## 2015-01-21 ENCOUNTER — Other Ambulatory Visit: Payer: Self-pay | Admitting: Internal Medicine

## 2015-02-15 ENCOUNTER — Ambulatory Visit (INDEPENDENT_AMBULATORY_CARE_PROVIDER_SITE_OTHER): Payer: BLUE CROSS/BLUE SHIELD | Admitting: Internal Medicine

## 2015-02-15 ENCOUNTER — Encounter: Payer: Self-pay | Admitting: Internal Medicine

## 2015-02-15 VITALS — BP 144/68 | HR 82 | Ht 77.0 in | Wt 241.6 lb

## 2015-02-15 DIAGNOSIS — I10 Essential (primary) hypertension: Secondary | ICD-10-CM | POA: Diagnosis not present

## 2015-02-15 DIAGNOSIS — I442 Atrioventricular block, complete: Secondary | ICD-10-CM

## 2015-02-15 DIAGNOSIS — E785 Hyperlipidemia, unspecified: Secondary | ICD-10-CM | POA: Diagnosis not present

## 2015-02-15 DIAGNOSIS — R0683 Snoring: Secondary | ICD-10-CM | POA: Diagnosis not present

## 2015-02-15 LAB — MDC_IDC_ENUM_SESS_TYPE_INCLINIC
Brady Statistic AP VP Percent: 4 %
Brady Statistic AS VP Percent: 96 %
Brady Statistic AS VS Percent: 0 %
Date Time Interrogation Session: 20160404140046
Lead Channel Impedance Value: 523 Ohm
Lead Channel Impedance Value: 650 Ohm
Lead Channel Pacing Threshold Pulse Width: 0.4 ms
Lead Channel Sensing Intrinsic Amplitude: 4 mV
Lead Channel Setting Sensing Sensitivity: 2.8 mV
MDC IDC MSMT BATTERY IMPEDANCE: 253 Ohm
MDC IDC MSMT BATTERY REMAINING LONGEVITY: 108 mo
MDC IDC MSMT BATTERY VOLTAGE: 2.79 V
MDC IDC MSMT LEADCHNL RA PACING THRESHOLD AMPLITUDE: 0.75 V
MDC IDC MSMT LEADCHNL RV PACING THRESHOLD AMPLITUDE: 0.5 V
MDC IDC MSMT LEADCHNL RV PACING THRESHOLD PULSEWIDTH: 0.4 ms
MDC IDC SET LEADCHNL RA PACING AMPLITUDE: 2 V
MDC IDC SET LEADCHNL RV PACING AMPLITUDE: 2.5 V
MDC IDC SET LEADCHNL RV PACING PULSEWIDTH: 0.4 ms
MDC IDC STAT BRADY AP VS PERCENT: 0 %

## 2015-02-15 NOTE — Patient Instructions (Signed)
Remote monitoring is used to monitor your pacemaker from home. This monitoring reduces the number of office visits required to check your device to one time per year. It allows [place] to keep an eye on the functioning of your device to ensure it is working properly. You are scheduled for a device check from home on 05/18/2015. You may send your transmission at any time that day. If you have a wireless device, the transmission will be sent automatically. After your physician reviews your transmission, you will receive a postcard with your next transmission date.  Your physician recommends that you schedule a follow-up appointment in: 12 months with Dr.Allred

## 2015-02-15 NOTE — Progress Notes (Signed)
Electrophysiology Office Note   Date:  02/15/2015   ID:  Ova, Meegan 12-20-1945, MRN [id number]  PCP:  Unice Cobble, MD   Primary Electrophysiologist: Thompson Grayer, MD    Chief Complaint  Patient presents with  . Follow-up    hypertension     History of Present Illness: [name] is a 69 y.o. male who presents today for electrophysiology evaluation.   He is doing very well.  He continues to travel frequently with work.  He plays golf more than once per week and also walks several times per week without cardiac limitation.  He has rare "fluttering" but denies any sustained arrhythmias, dizziness, presyncope, or syncope.   Today, he denies symptoms of chest pain, shortness of breath, orthopnea, PND, lower extremity edema, claudication,   bleeding, or neurologic sequela. The patient is tolerating medications without difficulties and is otherwise without complaint today.    Past Medical History  Diagnosis Date  . Hyperlipidemia   . Coronary artery disease     Non-obstructive. Pt had left heart catheterization in August 2009 showing a 30% mid-circumflex lesion, otherwise coronaries were clean angiographically  . Hypertension   . OSA (obstructive sleep apnea)     Probable; study never scheduled  . Complete heart block     S/P  PPM by Dr Rayann Heman   Past Surgical History  Procedure Laterality Date  . Cardiac catheterization  1993, 2009    2009- negative  . Pacemaker insertion  01/13/11    MDT by Greggory Brandy for complete heart block  . Colonoscopy  2011    negative, [facility]     Current Outpatient Prescriptions  Medication Sig Dispense Refill  . allopurinol (ZYLOPRIM) 300 MG tablet Take 150 mg by mouth daily.    Marland Kitchen CIALIS 20 MG tablet TAKE 1 TABLET EVERY 3 DAYS AS NEEDED ONLY (Patient taking differently: TAKE 1 TABLET BY MOUTH EVERY 3 DAYS AS NEEDED ONLY FOR ED) 10 tablet 3  . clobetasol (TEMOVATE) 0.05 % cream Apply 1 application topically daily as needed (itching).      . fish oil-omega-3 fatty acids 1000 MG capsule Take 1 g by mouth 2 (two) times daily.      Marland Kitchen losartan (COZAAR) 100 MG tablet TAKE 1 TABLET DAILY (Patient taking differently: TAKE 1 TABLET BY MOUTH DAILY) 30 tablet 0  . simvastatin (ZOCOR) 40 MG tablet TAKE 1 TABLET (40 MG TOTAL) BY MOUTH AT BEDTIME. 30 tablet 5   No current facility-administered medications for this visit.    Allergies:   Atorvastatin; Sildenafil; and Ramipril   Social History:  The patient  reports that he quit smoking about 9 years ago. He does not have any smokeless tobacco history on file. He reports that he drinks alcohol. He reports that he does not use illicit drugs.   Family History:  The patient's family history includes Arrhythmia in his mother; Cancer in his mother; Diabetes in his father; Gout in his brother; Heart attack (age of onset: 52) in his maternal grandfather; Other in his sister; Peripheral vascular disease in his mother. There is no history of Stroke.    ROS:  Please see the history of present illness.   All other systems are reviewed and negative.    PHYSICAL EXAM: VS:  BP 144/68 mmHg  Pulse 82  Ht 6\' 5"  (1.956 m)  Wt 241 lb 9.6 oz (109.589 kg)  BMI 28.64 kg/m2 , BMI Body mass index is 28.64 kg/(m^2). GEN: Well nourished, well  developed, in no acute distress HEENT: normal Neck: no JVD, carotid bruits, or masses Cardiac: RRR; no murmurs, rubs, or gallops,no edema  Respiratory:  clear to auscultation bilaterally, normal work of breathing GI: soft, nontender, nondistended, + BS MS: no deformity or atrophy Skin: warm and dry, device pocket is well healed Neuro:  Strength and sensation are intact Psych: euthymic mood, full affect  Device interrogation is reviewed today in detail.  See PaceArt for details.   Recent Labs: 09/09/2014: ALT 47; BUN 19; Creatinine 1.2; Hemoglobin 14.3; Platelets 169.0; Potassium 4.0; Sodium 139; TSH 1.73    Lipid Panel     Component Value Date/Time   CHOL  192 09/09/2014 1013   CHOL 176 08/26/2013 1354   TRIG 171* 09/09/2014 1013   TRIG 187.0* 08/26/2013 1354   HDL 56 09/09/2014 1013   HDL 52.90 08/26/2013 1354   CHOLHDL 3 08/26/2013 1354   VLDL 37.4 08/26/2013 1354   LDLCALC 102* 09/09/2014 1013   LDLCALC 86 08/26/2013 1354   LDLDIRECT 109.1 08/13/2008 1030     Wt Readings from Last 3 Encounters:  02/15/15 241 lb 9.6 oz (109.589 kg)  09/08/14 243 lb 4 oz (110.337 kg)  02/09/14 243 lb 9.6 oz (110.496 kg)     ASSESSMENT AND PLAN:  1. Complete heart block Normal pacemaker function See Pace Art report No changes today  His bipolar R waves are low.  He has done well with unipolar sensing. He is enrolled in the [place] with [employer]  2. HTN Stable No change required today  3. HL Lipids are reviewed today No changes  4. Snoring, headaches Sleep study advised.  He declines  5. ETOh I am concerned that he may drink a little too much Moderation encouraged  6. Rare NSVT Minimal symptoms No presyncope or syncope Will follow If episodes increase, would consider an echo  carelink Return in 1 year   Current medicines are reviewed at length with the patient today.   The patient does not have concerns regarding his medicines.  The following changes were made today:  none   Signed, Thompson Grayer, MD  02/15/2015 12:07 PM     [address] [place] [address] [phone] (office) [phone] (fax)

## 2015-04-01 ENCOUNTER — Encounter: Payer: Self-pay | Admitting: Gastroenterology

## 2015-05-18 ENCOUNTER — Ambulatory Visit (INDEPENDENT_AMBULATORY_CARE_PROVIDER_SITE_OTHER): Payer: BLUE CROSS/BLUE SHIELD | Admitting: *Deleted

## 2015-05-18 ENCOUNTER — Encounter: Payer: Self-pay | Admitting: Internal Medicine

## 2015-05-18 DIAGNOSIS — I442 Atrioventricular block, complete: Secondary | ICD-10-CM | POA: Diagnosis not present

## 2015-05-19 NOTE — Progress Notes (Signed)
Remote pacemaker transmission.   

## 2015-05-23 LAB — CUP PACEART REMOTE DEVICE CHECK
Battery Impedance: 277 Ohm
Battery Remaining Longevity: 105 mo
Battery Voltage: 2.79 V
Brady Statistic AP VP Percent: 4 %
Brady Statistic AP VS Percent: 0 %
Brady Statistic AS VP Percent: 96 %
Lead Channel Pacing Threshold Amplitude: 0.5 V
Lead Channel Pacing Threshold Amplitude: 0.75 V
Lead Channel Pacing Threshold Pulse Width: 0.4 ms
Lead Channel Sensing Intrinsic Amplitude: 2.8 mV
Lead Channel Setting Pacing Amplitude: 2 V
Lead Channel Setting Pacing Amplitude: 2.5 V
Lead Channel Setting Pacing Pulse Width: 0.4 ms
Lead Channel Setting Sensing Sensitivity: 2.8 mV
MDC IDC MSMT LEADCHNL RA IMPEDANCE VALUE: 486 Ohm
MDC IDC MSMT LEADCHNL RV IMPEDANCE VALUE: 646 Ohm
MDC IDC MSMT LEADCHNL RV PACING THRESHOLD PULSEWIDTH: 0.4 ms
MDC IDC SESS DTM: 20160705110055
MDC IDC STAT BRADY AS VS PERCENT: 0 %

## 2015-05-26 ENCOUNTER — Encounter: Payer: Self-pay | Admitting: Cardiology

## 2015-07-12 ENCOUNTER — Ambulatory Visit: Payer: Self-pay | Admitting: Internal Medicine

## 2015-07-12 DIAGNOSIS — Z0289 Encounter for other administrative examinations: Secondary | ICD-10-CM

## 2015-07-12 DIAGNOSIS — R4689 Other symptoms and signs involving appearance and behavior: Secondary | ICD-10-CM | POA: Insufficient documentation

## 2015-07-14 ENCOUNTER — Other Ambulatory Visit (INDEPENDENT_AMBULATORY_CARE_PROVIDER_SITE_OTHER): Payer: BLUE CROSS/BLUE SHIELD

## 2015-07-14 ENCOUNTER — Ambulatory Visit (INDEPENDENT_AMBULATORY_CARE_PROVIDER_SITE_OTHER): Payer: BLUE CROSS/BLUE SHIELD | Admitting: Internal Medicine

## 2015-07-14 ENCOUNTER — Encounter: Payer: Self-pay | Admitting: Internal Medicine

## 2015-07-14 VITALS — BP 140/80 | HR 77 | Temp 98.2°F | Resp 16 | Wt 243.0 lb

## 2015-07-14 DIAGNOSIS — Z1159 Encounter for screening for other viral diseases: Secondary | ICD-10-CM

## 2015-07-14 DIAGNOSIS — I10 Essential (primary) hypertension: Secondary | ICD-10-CM

## 2015-07-14 DIAGNOSIS — R519 Headache, unspecified: Secondary | ICD-10-CM

## 2015-07-14 DIAGNOSIS — M79662 Pain in left lower leg: Secondary | ICD-10-CM

## 2015-07-14 DIAGNOSIS — J209 Acute bronchitis, unspecified: Secondary | ICD-10-CM | POA: Diagnosis not present

## 2015-07-14 DIAGNOSIS — Z23 Encounter for immunization: Secondary | ICD-10-CM | POA: Diagnosis not present

## 2015-07-14 DIAGNOSIS — R51 Headache: Secondary | ICD-10-CM

## 2015-07-14 LAB — BASIC METABOLIC PANEL
BUN: 18 mg/dL (ref 6–23)
CALCIUM: 9.8 mg/dL (ref 8.4–10.5)
CO2: 29 mEq/L (ref 19–32)
CREATININE: 1.02 mg/dL (ref 0.40–1.50)
Chloride: 102 mEq/L (ref 96–112)
GFR: 77.3 mL/min (ref >60.00)
Glucose, Bld: 111 mg/dL — ABNORMAL HIGH (ref 70–99)
Potassium: 4.1 mEq/L (ref 3.5–5.1)
Sodium: 138 mEq/L (ref 135–145)

## 2015-07-14 LAB — MAGNESIUM: Magnesium: 1.9 mg/dL (ref 1.5–2.5)

## 2015-07-14 MED ORDER — AMLODIPINE BESYLATE 5 MG PO TABS: 5.0000 mg | ORAL_TABLET | Freq: Every day | ORAL | Status: DC

## 2015-07-14 MED ORDER — AZITHROMYCIN 250 MG PO TABS: ORAL_TABLET | ORAL | Status: DC

## 2015-07-14 MED ORDER — HYDROCODONE-HOMATROPINE 5-1.5 MG/5ML PO SYRP: 5.0000 mL | ORAL_SOLUTION | Freq: Four times a day (QID) | ORAL | Status: DC | PRN

## 2015-07-14 NOTE — Progress Notes (Signed)
   Subjective:    Patient ID: [name], male    DOB: August 22, 1946, 69 y.o.   MRN: [id number]  HPI  He is here with multiple concerns. On 07/08/17 he developed a sore throat followed by a nonproductive cough. This has been associated with slight wheezing. He has had to sleep sitting up because the cough. He's had only scant clear-gray sputum. He has been taking honey/lemon with only minimal response.  He has no associated upper respiratory tract infection symptoms. He also denies shortness of breath. He has no extrinsic symptoms either.  He has been having headaches in the posterior occipital area bilaterally associated with dizziness. He questions whether this is related to his blood pressure or computer use. He does not monitor his blood pressure at home. Headaches are described as sharp and lasting hours-days. Advil, Tylenol, aspirin do help. He  does have some bruising with aspirin or Advil. His ophthalmology exam is up-to-date; no glaucoma is present.  He's been compliant with his medications without adverse effects otherwise. He has decreased his exercise due to the summer heat.  He's requesting hepatitis C screening. Also he wishes to have the pneumonia vaccine.   Review of Systems Frontal headache, facial pain , nasal purulence, dental pain, sore throat , otic pain or otic discharge denied. No fever , chills or sweats. He denies itchy, watery eyes, sneezing.  Chest pain, palpitations, tachycardia, exertional dyspnea, paroxysmal nocturnal dyspnea, claudication or edema are absent.  He's been having some calf pain on the left. This resulted in his jumping up out of bed and spraining the left ankle recently. He is concerned that his magnesium may be low.    Objective:   Physical Exam Pertinent or positive findings include: Pupils are small. Nares are dry and erythematous. He has an upper partial. There are Dupuytren's contractures of the palms, greater on the right than the left. He  is deeply tanned.  General appearance :adequately nourished; in no distress.  Eyes: No conjunctival inflammation or scleral icterus is present.  Oral exam:  Lips and gums are healthy appearing.There is no oropharyngeal erythema or exudate noted. Dental hygiene is good.  Heart:  Normal rate and regular rhythm. S1 and S2 normal without gallop, murmur, click, rub or other extra sounds    Lungs:Chest clear to auscultation; no wheezes, rhonchi,rales ,or rubs present.No increased work of breathing.   Abdomen: bowel sounds normal, soft and non-tender without masses, organomegaly or hernias noted.  No guarding or rebound.  Vascular : all pulses equal ; no bruits present.  Skin:Warm & dry.  Intact without suspicious lesions or rashes ; no tenting or jaundice   Lymphatic: No lymphadenopathy is noted about the head, neck, axilla.   Neuro: Strength, tone & DTRs normal.        Assessment & Plan:   #1 acute bronchitis  #2 essential hypertension  #3 left calf pain  #4 headache disorder  See orders and after visit summary

## 2015-07-14 NOTE — Progress Notes (Signed)
Pre visit review using our clinic review tool, if applicable. No additional management support is needed unless otherwise documented below in the visit note. 

## 2015-07-14 NOTE — Patient Instructions (Signed)
Minimal Blood Pressure Goal= AVERAGE < 140/90;  Ideal is an AVERAGE < 135/85. This AVERAGE should be calculated from @ least 5-7 BP readings taken @ different times of day on different days of week. You should not respond to isolated BP readings , but rather the AVERAGE for that week .Please bring your  blood pressure cuff to office visits to verify that it is reliable.It  can also be checked against the blood pressure device at the pharmacy. Finger or wrist cuffs are not dependable; an arm cuff is.Fill the  prescription for the BP medication if BP NOT @ goal based on  7 to 14 day average.  Please keep a diary of your headaches . Document  each occurrence on the calendar with notation of : #1 any prodrome ( any non headache symptom such as marked fatigue,visual changes, ,etc ) which precedes actual headache ; #2) severity on 1-10 scale; #3) any triggers ( food/ drink,enviromenntal or weather changes ,physical or emotional stress) in 8-12 hour period prior to the headache; & #4) response to any medications or other intervention. Please review "Headache" @ [facility] for additional information.     Your next office appointment will be determined based upon review of your pending labs. Those written interpretation of the lab results and instructions will be transmitted to you by My Chart  Critical results will be called.   Followup as needed for any active or acute issue. Please report any significant change in your symptoms.

## 2015-07-15 LAB — HEPATITIS C ANTIBODY: HCV AB: NEGATIVE

## 2015-07-18 ENCOUNTER — Other Ambulatory Visit: Payer: Self-pay | Admitting: Internal Medicine

## 2015-07-20 ENCOUNTER — Other Ambulatory Visit: Payer: Self-pay | Admitting: Internal Medicine

## 2015-07-20 DIAGNOSIS — J209 Acute bronchitis, unspecified: Secondary | ICD-10-CM

## 2015-07-26 ENCOUNTER — Other Ambulatory Visit: Payer: Self-pay | Admitting: Emergency Medicine

## 2015-07-26 DIAGNOSIS — J209 Acute bronchitis, unspecified: Secondary | ICD-10-CM

## 2015-07-26 MED ORDER — SIMVASTATIN 40 MG PO TABS: ORAL_TABLET | ORAL | Status: DC

## 2015-07-26 MED ORDER — HYDROCODONE-HOMATROPINE 5-1.5 MG/5ML PO SYRP: 5.0000 mL | ORAL_SOLUTION | Freq: Four times a day (QID) | ORAL | Status: DC | PRN

## 2015-07-26 MED ORDER — ALLOPURINOL 300 MG PO TABS: 150.0000 mg | ORAL_TABLET | Freq: Every day | ORAL | Status: DC

## 2015-07-27 NOTE — Telephone Encounter (Signed)
Script was [facility] to [facility] by Laverna Peace...Johny Chess

## 2015-07-28 ENCOUNTER — Telehealth: Payer: Self-pay | Admitting: Emergency Medicine

## 2015-07-28 NOTE — Telephone Encounter (Signed)
Pt is out of town for work and is unable to pick up Hydrocodone Cough Syrup up until next week. He asked if there was something else that could be sent in rather than picked up. Please advise?

## 2015-07-28 NOTE — Telephone Encounter (Signed)
Genetic Tessalon Perles 200 mg every 6-8 hours as needed dispense 21  If his cough persists; he should be reevaluated

## 2015-07-29 MED ORDER — BENZONATATE 200 MG PO CAPS: ORAL_CAPSULE | ORAL | Status: DC

## 2015-07-29 NOTE — Telephone Encounter (Signed)
Tessalon has been sent to [facility]. LVM informing pt.

## 2015-07-30 ENCOUNTER — Telehealth: Payer: Self-pay | Admitting: Emergency Medicine

## 2015-07-30 ENCOUNTER — Other Ambulatory Visit: Payer: Self-pay | Admitting: Emergency Medicine

## 2015-07-30 MED ORDER — TADALAFIL 20 MG PO TABS: ORAL_TABLET | ORAL | Status: DC

## 2015-07-30 NOTE — Telephone Encounter (Signed)
[facility] refill request for Cialis #8

## 2015-07-30 NOTE — Telephone Encounter (Signed)
Whatever insurance will cover OK

## 2015-08-08 ENCOUNTER — Other Ambulatory Visit: Payer: Self-pay | Admitting: Internal Medicine

## 2015-08-19 ENCOUNTER — Ambulatory Visit (INDEPENDENT_AMBULATORY_CARE_PROVIDER_SITE_OTHER): Payer: BLUE CROSS/BLUE SHIELD | Admitting: *Deleted

## 2015-08-19 ENCOUNTER — Encounter: Payer: Self-pay | Admitting: Internal Medicine

## 2015-08-19 DIAGNOSIS — I442 Atrioventricular block, complete: Secondary | ICD-10-CM | POA: Diagnosis not present

## 2015-08-19 NOTE — Progress Notes (Signed)
Remote pacemaker transmission.   

## 2015-08-20 ENCOUNTER — Encounter: Payer: Self-pay | Admitting: Internal Medicine

## 2015-08-31 ENCOUNTER — Ambulatory Visit (INDEPENDENT_AMBULATORY_CARE_PROVIDER_SITE_OTHER): Payer: BLUE CROSS/BLUE SHIELD

## 2015-08-31 DIAGNOSIS — Z23 Encounter for immunization: Secondary | ICD-10-CM | POA: Diagnosis not present

## 2015-08-31 NOTE — Progress Notes (Signed)
Pre visit review using our clinic review tool, if applicable. No additional management support is needed unless otherwise documented below in the visit note. 

## 2015-09-06 LAB — CUP PACEART REMOTE DEVICE CHECK
Battery Voltage: 2.79 V
Brady Statistic AS VP Percent: 96 %
Date Time Interrogation Session: 20161006133955
Implantable Lead Implant Date: 20120302
Implantable Lead Location: 753860
Implantable Lead Model: 5086
Lead Channel Pacing Threshold Amplitude: 0.5 V
Lead Channel Pacing Threshold Amplitude: 0.75 V
Lead Channel Pacing Threshold Pulse Width: 0.4 ms
Lead Channel Setting Pacing Amplitude: 2 V
Lead Channel Setting Pacing Amplitude: 2.5 V
Lead Channel Setting Pacing Pulse Width: 0.4 ms
Lead Channel Setting Sensing Sensitivity: 2.8 mV
MDC IDC LEAD IMPLANT DT: 20120302
MDC IDC LEAD LOCATION: 753859
MDC IDC MSMT BATTERY IMPEDANCE: 301 Ohm
MDC IDC MSMT BATTERY REMAINING LONGEVITY: 101 mo
MDC IDC MSMT LEADCHNL RA IMPEDANCE VALUE: 507 Ohm
MDC IDC MSMT LEADCHNL RA PACING THRESHOLD PULSEWIDTH: 0.4 ms
MDC IDC MSMT LEADCHNL RA SENSING INTR AMPL: 2.8 mV
MDC IDC MSMT LEADCHNL RV IMPEDANCE VALUE: 605 Ohm
MDC IDC STAT BRADY AP VP PERCENT: 4 %
MDC IDC STAT BRADY AP VS PERCENT: 0 %
MDC IDC STAT BRADY AS VS PERCENT: 0 %

## 2015-09-23 ENCOUNTER — Encounter: Payer: Self-pay | Admitting: *Deleted

## 2015-09-23 ENCOUNTER — Encounter: Payer: Self-pay | Admitting: Internal Medicine

## 2015-09-23 ENCOUNTER — Other Ambulatory Visit: Payer: Self-pay | Admitting: Emergency Medicine

## 2015-09-23 MED ORDER — LOSARTAN POTASSIUM 100 MG PO TABS: 100.0000 mg | ORAL_TABLET | Freq: Every day | ORAL | Status: DC

## 2015-11-01 ENCOUNTER — Encounter: Payer: Self-pay | Admitting: Internal Medicine

## 2015-11-01 DIAGNOSIS — I10 Essential (primary) hypertension: Secondary | ICD-10-CM

## 2015-11-02 MED ORDER — AMLODIPINE BESYLATE 5 MG PO TABS: 5.0000 mg | ORAL_TABLET | Freq: Every day | ORAL | Status: DC

## 2015-11-02 MED ORDER — SIMVASTATIN 40 MG PO TABS: ORAL_TABLET | ORAL | Status: DC

## 2015-11-02 MED ORDER — LOSARTAN POTASSIUM 100 MG PO TABS: 100.0000 mg | ORAL_TABLET | Freq: Every day | ORAL | Status: DC

## 2015-11-02 MED ORDER — ALLOPURINOL 300 MG PO TABS: 150.0000 mg | ORAL_TABLET | Freq: Every day | ORAL | Status: DC

## 2015-11-02 NOTE — Telephone Encounter (Signed)
rx's printed -- see if he needs more cialis please.

## 2015-11-10 ENCOUNTER — Telehealth: Payer: Self-pay | Admitting: *Deleted

## 2015-11-10 DIAGNOSIS — T50905A Adverse effect of unspecified drugs, medicaments and biological substances, initial encounter: Secondary | ICD-10-CM

## 2015-11-10 NOTE — Telephone Encounter (Signed)
CK is a muscle enzyme , not a kidney test. The combination of meds he takes increases risk of muscle injury I put order in  I'll renew meds X 1 month if CK normal; if it is high he would need to be seen and meds possibly changed

## 2015-11-10 NOTE — Telephone Encounter (Signed)
He needs CK; Dx: adverse drug reaction  After CK drawn fill meds X 1 month Needs new MD

## 2015-11-10 NOTE — Telephone Encounter (Signed)
Pt states he had his kidneys check couple months ago (Aug) and he has already made appt to see Dr. Quay Burow in Feb. He states been taking the two meds with no problem./lmb

## 2015-11-10 NOTE — Telephone Encounter (Signed)
Left msg on triage stating received rx's for Amlodipine 5 mg & Simvastatin 40 mg. Needing ok to refill per FDA Guideline it states pt is not suppose to be taking over 20 mg simvastatin while taking amlodipine because it could increase myopathy. Is it ok to refill if so call back with Ref # [id number]...Johny Chess

## 2015-11-10 NOTE — Telephone Encounter (Signed)
Called pt no answer LMOM RTC.../lmb 

## 2015-11-11 NOTE — Telephone Encounter (Signed)
Pt called back he stated that he is currently out of town and will not be back until 11/24/15. He had already spoke with [facility] not sure why they sent a request because after the new year he will not be having insurance with the company because he is retiring. He is going to come in to have labs check 11/24/15, but he did state that he will need the [facility]. He states he is not out of medication, but he will be out by the time he see Dr. Quay Burow in Feb. Inform pt we can send refill to local pharmacy if labs are normal..../lmb

## 2015-11-12 NOTE — Telephone Encounter (Signed)
Called [facility] inform pharmacist to cancel the 2 prescriptions until further notice...[name]

## 2015-11-18 ENCOUNTER — Ambulatory Visit (INDEPENDENT_AMBULATORY_CARE_PROVIDER_SITE_OTHER): Payer: BLUE CROSS/BLUE SHIELD | Admitting: *Deleted

## 2015-11-18 DIAGNOSIS — I442 Atrioventricular block, complete: Secondary | ICD-10-CM | POA: Diagnosis not present

## 2015-11-19 NOTE — Progress Notes (Signed)
Remote pacemaker transmission.   

## 2015-11-28 LAB — CUP PACEART REMOTE DEVICE CHECK
Battery Impedance: 277 Ohm
Battery Remaining Longevity: 103 mo
Battery Voltage: 2.79 V
Date Time Interrogation Session: 20170105130541
Implantable Lead Implant Date: 20120302
Implantable Lead Location: 753859
Implantable Lead Model: 5076
Lead Channel Impedance Value: 472 Ohm
Lead Channel Pacing Threshold Pulse Width: 0.4 ms
Lead Channel Setting Pacing Amplitude: 2 V
Lead Channel Setting Pacing Amplitude: 2.5 V
Lead Channel Setting Pacing Pulse Width: 0.4 ms
Lead Channel Setting Sensing Sensitivity: 2.8 mV
MDC IDC LEAD IMPLANT DT: 20120302
MDC IDC LEAD LOCATION: 753860
MDC IDC LEAD MODEL: 5086
MDC IDC MSMT LEADCHNL RA PACING THRESHOLD AMPLITUDE: 0.75 V
MDC IDC MSMT LEADCHNL RA PACING THRESHOLD PULSEWIDTH: 0.4 ms
MDC IDC MSMT LEADCHNL RA SENSING INTR AMPL: 2.8 mV
MDC IDC MSMT LEADCHNL RV IMPEDANCE VALUE: 578 Ohm
MDC IDC MSMT LEADCHNL RV PACING THRESHOLD AMPLITUDE: 0.5 V
MDC IDC STAT BRADY AP VP PERCENT: 4 %
MDC IDC STAT BRADY AP VS PERCENT: 0 %
MDC IDC STAT BRADY AS VP PERCENT: 96 %
MDC IDC STAT BRADY AS VS PERCENT: 0 %

## 2015-12-01 ENCOUNTER — Encounter: Payer: Self-pay | Admitting: Cardiology

## 2016-01-20 ENCOUNTER — Encounter: Payer: Self-pay | Admitting: Internal Medicine

## 2016-01-20 ENCOUNTER — Other Ambulatory Visit (INDEPENDENT_AMBULATORY_CARE_PROVIDER_SITE_OTHER): Payer: PPO

## 2016-01-20 ENCOUNTER — Ambulatory Visit (INDEPENDENT_AMBULATORY_CARE_PROVIDER_SITE_OTHER): Payer: PPO | Admitting: Internal Medicine

## 2016-01-20 VITALS — BP 128/80 | HR 70 | Temp 98.2°F | Resp 16 | Wt 242.0 lb

## 2016-01-20 DIAGNOSIS — R51 Headache: Secondary | ICD-10-CM | POA: Diagnosis not present

## 2016-01-20 DIAGNOSIS — I10 Essential (primary) hypertension: Secondary | ICD-10-CM

## 2016-01-20 DIAGNOSIS — Z0001 Encounter for general adult medical examination with abnormal findings: Secondary | ICD-10-CM

## 2016-01-20 DIAGNOSIS — G8929 Other chronic pain: Secondary | ICD-10-CM

## 2016-01-20 DIAGNOSIS — Z0000 Encounter for general adult medical examination without abnormal findings: Secondary | ICD-10-CM | POA: Diagnosis not present

## 2016-01-20 DIAGNOSIS — N400 Enlarged prostate without lower urinary tract symptoms: Secondary | ICD-10-CM | POA: Diagnosis not present

## 2016-01-20 LAB — COMPREHENSIVE METABOLIC PANEL
ALK PHOS: 42 U/L (ref 39–117)
ALT: 60 U/L — ABNORMAL HIGH (ref 0–53)
AST: 51 U/L — ABNORMAL HIGH (ref 0–37)
Albumin: 4.6 g/dL (ref 3.5–5.2)
BUN: 16 mg/dL (ref 6–23)
CO2: 26 mEq/L (ref 19–32)
Calcium: 10.1 mg/dL (ref 8.4–10.5)
Chloride: 103 mEq/L (ref 96–112)
Creatinine, Ser: 0.97 mg/dL (ref 0.40–1.50)
GFR: 81.79 mL/min (ref >60.00)
GLUCOSE: 106 mg/dL — AB (ref 70–99)
POTASSIUM: 4.3 meq/L (ref 3.5–5.1)
SODIUM: 140 meq/L (ref 135–145)
TOTAL PROTEIN: 7.1 g/dL (ref 6.0–8.3)
Total Bilirubin: 0.7 mg/dL (ref 0.2–1.2)

## 2016-01-20 LAB — CBC WITH DIFFERENTIAL/PLATELET
BASOS ABS: 0 10*3/uL (ref 0.0–0.1)
Basophils Relative: 0.5 % (ref 0.0–3.0)
Eosinophils Absolute: 0.2 10*3/uL (ref 0.0–0.7)
Eosinophils Relative: 3.3 % (ref 0.0–5.0)
HCT: 44.2 % (ref 39.0–52.0)
Hemoglobin: 15 g/dL (ref 13.0–17.0)
LYMPHS ABS: 1.7 10*3/uL (ref 0.7–4.0)
Lymphocytes Relative: 31.9 % (ref 12.0–46.0)
MCHC: 34 g/dL (ref 30.0–36.0)
MCV: 100.7 fl — ABNORMAL HIGH (ref 78.0–100.0)
MONO ABS: 0.4 10*3/uL (ref 0.1–1.0)
Monocytes Relative: 7.8 % (ref 3.0–12.0)
NEUTROS PCT: 56.5 % (ref 43.0–77.0)
Neutro Abs: 3.1 10*3/uL (ref 1.4–7.7)
Platelets: 181 10*3/uL (ref 150.0–400.0)
RBC: 4.38 Mil/uL (ref 4.22–5.81)
RDW: 14.3 % (ref 11.5–15.5)
WBC: 5.4 10*3/uL (ref 4.0–10.5)

## 2016-01-20 LAB — LIPID PANEL
CHOL/HDL RATIO: 3
Cholesterol: 188 mg/dL (ref 0–200)
HDL: 57.9 mg/dL (ref >39.00)
LDL CALC: 94 mg/dL (ref 0–99)
NONHDL: 129.84
Triglycerides: 181 mg/dL — ABNORMAL HIGH (ref 0.0–149.0)
VLDL: 36.2 mg/dL (ref 0.0–40.0)

## 2016-01-20 LAB — HEMOGLOBIN A1C: Hgb A1c MFr Bld: 5.5 % (ref 4.6–6.5)

## 2016-01-20 LAB — TSH: TSH: 1.36 u[IU]/mL (ref 0.35–4.50)

## 2016-01-20 LAB — SEDIMENTATION RATE: Sed Rate: 9 mm/hr (ref 0–22)

## 2016-01-20 MED ORDER — AMLODIPINE BESYLATE 5 MG PO TABS: 5.0000 mg | ORAL_TABLET | Freq: Every day | ORAL | Status: DC

## 2016-01-20 MED ORDER — SIMVASTATIN 40 MG PO TABS: ORAL_TABLET | ORAL | Status: DC

## 2016-01-20 MED ORDER — LOSARTAN POTASSIUM 100 MG PO TABS: 100.0000 mg | ORAL_TABLET | Freq: Every day | ORAL | Status: DC

## 2016-01-20 MED ORDER — ALLOPURINOL 300 MG PO TABS: 150.0000 mg | ORAL_TABLET | Freq: Every day | ORAL | Status: DC

## 2016-01-20 NOTE — Patient Instructions (Signed)
Test(s) ordered today. Your results will be released to [facility] (or called to you) after review, usually within 72hours after test completion. If any changes need to be made, you will be notified at that same time.  All other Health Maintenance issues reviewed.   All recommended immunizations and age-appropriate screenings are up-to-date.  No immunizations administered today.   Medications reviewed and updated.  No changes recommended at this time.  Your prescription(s) have been submitted to your pharmacy. Please take as directed and contact our office if you believe you are having problem(s) with the medication(s).  A cat scan was ordered of your head - we will call you to schedule this.   Please followup annually.    Health Maintenance, Male A healthy lifestyle and preventative care can promote health and wellness.  Maintain regular health, dental, and eye exams.  Eat a healthy diet. Foods like vegetables, fruits, whole grains, low-fat dairy products, and lean protein foods contain the nutrients you need and are low in calories. Decrease your intake of foods high in solid fats, added sugars, and salt. Get information about a proper diet from your health care provider, if necessary.  Regular physical exercise is one of the most important things you can do for your health. Most adults should get at least 150 minutes of moderate-intensity exercise (any activity that increases your heart rate and causes you to sweat) each week. In addition, most adults need muscle-strengthening exercises on 2 or more days a week.   Maintain a healthy weight. The body mass index (BMI) is a screening tool to identify possible weight problems. It provides an estimate of body fat based on height and weight. Your health care provider can find your BMI and can help you achieve or maintain a healthy weight. For males 20 years and older:  A BMI below 18.5 is considered underweight.  A BMI of 18.5 to 24.9 is  normal.  A BMI of 25 to 29.9 is considered overweight.  A BMI of 30 and above is considered obese.  Maintain normal blood lipids and cholesterol by exercising and minimizing your intake of saturated fat. Eat a balanced diet with plenty of fruits and vegetables. Blood tests for lipids and cholesterol should begin at age 49 and be repeated every 5 years. If your lipid or cholesterol levels are high, you are over age 90, or you are at high risk for heart disease, you may need your cholesterol levels checked more frequently.Ongoing high lipid and cholesterol levels should be treated with medicines if diet and exercise are not working.  If you smoke, find out from your health care provider how to quit. If you do not use tobacco, do not start.  Lung cancer screening is recommended for adults aged 71-80 years who are at high risk for developing lung cancer because of a history of smoking. A yearly low-dose CT scan of the lungs is recommended for people who have at least a 30-pack-year history of smoking and are current smokers or have quit within the past 15 years. A pack year of smoking is smoking an average of 1 pack of cigarettes a day for 1 year (for example, a 30-pack-year history of smoking could mean smoking 1 pack a day for 30 years or 2 packs a day for 15 years). Yearly screening should continue until the smoker has stopped smoking for at least 15 years. Yearly screening should be stopped for people who develop a health problem that would prevent them from  having lung cancer treatment.  If you choose to drink alcohol, do not have more than 2 drinks per day. One drink is considered to be 12 oz (360 mL) of beer, 5 oz (150 mL) of wine, or 1.5 oz (45 mL) of liquor.  Avoid the use of street drugs. Do not share needles with anyone. Ask for help if you need support or instructions about stopping the use of drugs.  High blood pressure causes heart disease and increases the risk of stroke. High blood  pressure is more likely to develop in:  People who have blood pressure in the end of the normal range (100-139/85-89 mm Hg).  People who are overweight or obese.  People who are African American.  If you are 90-60 years of age, have your blood pressure checked every 3-5 years. If you are 60 years of age or older, have your blood pressure checked every year. You should have your blood pressure measured twice--once when you are at a hospital or clinic, and once when you are not at a hospital or clinic. Record the average of the two measurements. To check your blood pressure when you are not at a hospital or clinic, you can use:  An automated blood pressure machine at a pharmacy.  A home blood pressure monitor.  If you are 70-53 years old, ask your health care provider if you should take aspirin to prevent heart disease.  Diabetes screening involves taking a blood sample to check your fasting blood sugar level. This should be done once every 3 years after age 44 if you are at a normal weight and without risk factors for diabetes. Testing should be considered at a younger age or be carried out more frequently if you are overweight and have at least 1 risk factor for diabetes.  Colorectal cancer can be detected and often prevented. Most routine colorectal cancer screening begins at the age of 48 and continues through age 51. However, your health care provider may recommend screening at an earlier age if you have risk factors for colon cancer. On a yearly basis, your health care provider may provide home test kits to check for hidden blood in the stool. A small camera at the end of a tube may be used to directly examine the colon (sigmoidoscopy or colonoscopy) to detect the earliest forms of colorectal cancer. Talk to your health care provider about this at age 77 when routine screening begins. A direct exam of the colon should be repeated every 5-10 years through age 16, unless early forms of  precancerous polyps or small growths are found.  People who are at an increased risk for hepatitis B should be screened for this virus. You are considered at high risk for hepatitis B if:  You were born in a country where hepatitis B occurs often. Talk with your health care provider about which countries are considered high risk.  Your parents were born in a high-risk country and you have not received a shot to protect against hepatitis B (hepatitis B vaccine).  You have HIV or AIDS.  You use needles to inject street drugs.  You live with, or have sex with, someone who has hepatitis B.  You are a man who has sex with other men (MSM).  You get hemodialysis treatment.  You take certain medicines for conditions like cancer, organ transplantation, and autoimmune conditions.  Hepatitis C blood testing is recommended for all people born from [birth year] through 1963 and any individual with known  risk factors for hepatitis C.  Healthy men should no longer receive prostate-specific antigen (PSA) blood tests as part of routine cancer screening. Talk to your health care provider about prostate cancer screening.  Testicular cancer screening is not recommended for adolescents or adult males who have no symptoms. Screening includes self-exam, a health care provider exam, and other screening tests. Consult with your health care provider about any symptoms you have or any concerns you have about testicular cancer.  Practice safe sex. Use condoms and avoid high-risk sexual practices to reduce the spread of sexually transmitted infections (STIs).  You should be screened for STIs, including gonorrhea and chlamydia if:  You are sexually active and are younger than 24 years.  You are older than 24 years, and your health care provider tells you that you are at risk for this type of infection.  Your sexual activity has changed since you were last screened, and you are at an increased risk for chlamydia or  gonorrhea. Ask your health care provider if you are at risk.  If you are at risk of being infected with HIV, it is recommended that you take a prescription medicine daily to prevent HIV infection. This is called pre-exposure prophylaxis (PrEP). You are considered at risk if:  You are a man who has sex with other men (MSM).  You are a heterosexual man who is sexually active with multiple partners.  You take drugs by injection.  You are sexually active with a partner who has HIV.  Talk with your health care provider about whether you are at high risk of being infected with HIV. If you choose to begin PrEP, you should first be tested for HIV. You should then be tested every 3 months for as long as you are taking PrEP.  Use sunscreen. Apply sunscreen liberally and repeatedly throughout the day. You should seek shade when your shadow is shorter than you. Protect yourself by wearing long sleeves, pants, a wide-brimmed hat, and sunglasses year round whenever you are outdoors.  Tell your health care provider of new moles or changes in moles, especially if there is a change in shape or color. Also, tell your health care provider if a mole is larger than the size of a pencil eraser.  A one-time screening for abdominal aortic aneurysm (AAA) and surgical repair of large AAAs by ultrasound is recommended for men aged 61-75 years who are current or former smokers.  Stay current with your vaccines (immunizations).   This information is not intended to replace advice given to you by your health care provider. Make sure you discuss any questions you have with your health care provider.   Document Released: 04/27/2008 Document Revised: 11/20/2014 Document Reviewed: 03/27/2011 Elsevier Interactive Patient Education [employer].

## 2016-01-20 NOTE — Progress Notes (Signed)
Subjective:    Patient ID: [name], male    DOB: 1946/03/18, 70 y.o.   MRN: [id number]  HPI He is here to establish with a new pcp.   He is here for a physical exam.  He has been having some mild headaches. Aleve or advil will work.  He is having headaches daily.  The pain is located in the right temple.  He had his eyes checked and his exam was normal.  It started 6-8 months ago. It has not gotten worse, but has been persistent. He takes an advil or aleve daily.   He stopped taking his aspirin daily because he was bruising too easily.   He denies any lightheadedness or dizziness.    He retired last December.  He spends 3 weeks at the beach and three weeks here.    Medications and allergies reviewed with patient and updated if appropriate.  Patient Active Problem List   Diagnosis Date Noted  . Non-compliant behavior 07/12/2015  . Snoring 02/15/2015  . Elevated liver enzymes 09/13/2014  . Dupuytren's contracture of both hands 09/02/2013  . History of gout 12/27/2011  . Essential hypertension 01/12/2011  . Complete heart block ([place]) 01/12/2011  . FATIGUE 01/12/2011  . Hyperglycemia 03/02/2009  . HYPERPLASIA PROSTATE UNS W/O UR OBST & OTH LUTS 08/13/2008  . DIZZINESS AND GIDDINESS 06/04/2008  . NONSPECIFIC ABNORMAL ELECTROCARDIOGRAM 06/04/2008  . OBSTRUCTIVE SLEEP APNEA 04/14/2008  . Hyperlipidemia 02/17/2008  . PAROXYSMAL SUPRAVENTRICULAR TACHYCARDIA 02/17/2008  . HYPERSOMNIA, ASSOCIATED WITH SLEEP APNEA 02/17/2008  . MITRAL VALVE PROLAPSE 04/17/2007    Current Outpatient Prescriptions on File Prior to Visit  Medication Sig Dispense Refill  . clobetasol (TEMOVATE) 0.05 % cream Apply 1 application topically daily as needed (itching).     . fish oil-omega-3 fatty acids 1000 MG capsule Take 1 g by mouth 2 (two) times daily.      . tadalafil (CIALIS) 20 MG tablet TAKE 1 TABLET EVERY 3 DAYS AS NEEDED ONLY 8 tablet 3   No current facility-administered medications on file  prior to visit.    Past Medical History  Diagnosis Date  . Hyperlipidemia   . Coronary artery disease     Non-obstructive. Pt had left heart catheterization in August 2009 showing a 30% mid-circumflex lesion, otherwise coronaries were clean angiographically  . Hypertension   . OSA (obstructive sleep apnea)     Probable; study never scheduled  . Complete heart block ([facility])     S/P  PPM by Dr [name]    Past Surgical History  Procedure Laterality Date  . Cardiac catheterization  1993, 2009    2009- negative  . Pacemaker insertion  01/13/11    MDT by [name] for complete heart block  . Colonoscopy  2011    negative, [facility]    Social History   Social History  . Marital Status: Married    Spouse Name: N/A  . Number of Children: N/A  . Years of Education: N/A   Social History Main Topics  . Smoking status: Former Smoker    Quit date: 11/13/2005  . Smokeless tobacco: None     Comment: smoked 1965- 2007, up to 2 ppd  . Alcohol Use: Yes     Comment:  21 drinks/ week  . Drug Use: No  . Sexual Activity: Not Asked   Other Topics Concern  . None   Social History Narrative   Exercise: regularly - golf and walks - does both a few  times a week    Family History  Problem Relation Age of Onset  . Heart attack Maternal Grandfather 56  . Diabetes Father   . Peripheral vascular disease Mother   . Cancer Mother     ? primary  . Arrhythmia Mother     AFIB  . Gout Brother   . Leukemia Brother   . Stroke Neg Hx   . Other Sister     prediabetic    Review of Systems  Constitutional: Negative for fever, chills, appetite change and unexpected weight change.  HENT: Negative for hearing loss.   Eyes: Negative for visual disturbance.  Respiratory: Negative for cough, shortness of breath and wheezing.   Cardiovascular: Negative for chest pain, palpitations and leg swelling.  Gastrointestinal: Negative for nausea, abdominal pain, diarrhea, constipation and blood in stool.       No  gerd  Genitourinary: Positive for frequency. Negative for dysuria, hematuria and difficulty urinating.  Musculoskeletal: Positive for arthralgias (knee pain). Negative for back pain.  Skin: Negative for color change and rash.  Neurological: Positive for headaches (right side, daily). Negative for dizziness, weakness, light-headedness and numbness.  Psychiatric/Behavioral: Negative for dysphoric mood. The patient is not nervous/anxious.        Objective:   Filed Vitals:   01/20/16 0939  BP: 128/80  Pulse: 70  Temp: 98.2 F (36.8 C)  Resp: 16   Filed Weights   01/20/16 0939  Weight: 242 lb (109.77 kg)   Body mass index is 28.69 kg/(m^2).   Physical Exam Constitutional: He appears well-developed and well-nourished. No distress.  HENT:  Head: Normocephalic and atraumatic.  Right Ear: External ear normal.  Left Ear: External ear normal.  Mouth/Throat: Oropharynx is clear and moist.  Normal ear canals and TM b/l  Eyes: Conjunctivae and EOM are normal.  Neck: Neck supple. No tracheal deviation present. No thyromegaly present.  No carotid bruit  Cardiovascular: Normal rate, regular rhythm, normal heart sounds and intact distal pulses.   No murmur heard. Pulmonary/Chest: Effort normal and breath sounds normal. No respiratory distress. He has no wheezes. He has no rales.  Abdominal: Soft. Bowel sounds are normal. He exhibits no distension. There is no tenderness.  Genitourinary: slightly enlarged prostate without nodules Musculoskeletal: He exhibits no edema.  Lymphadenopathy:    He has no cervical adenopathy.  Skin: Skin is warm and dry. He is not diaphoretic.  Psychiatric: He has a normal mood and affect. His behavior is normal.       Assessment & Plan:   Physical exam: Screening blood work ordered Immunizations up to date Colonoscopy up to date Sees derm annually for a skin check Eye exams up to date  Exercises regularly No memory concerns Fully independent of  ADLs Advanced directives in place  Chronic headache Present for months, no change Located in the right temple region advil or aleve effective, but taking almost daily Possible rebound headaches, but are very localized Eye exam normal Will check esr Check ct of head   See Problem List for Assessment and Plan of chronic medical problems.  Follow up annually for PE

## 2016-01-20 NOTE — Assessment & Plan Note (Addendum)
Blood pressure  Well-controlled  Continue current medication

## 2016-01-20 NOTE — Progress Notes (Signed)
Pre visit review using our clinic review tool, if applicable. No additional management support is needed unless otherwise documented below in the visit note. 

## 2016-01-21 ENCOUNTER — Encounter: Payer: Self-pay | Admitting: Internal Medicine

## 2016-01-21 LAB — PSA, TOTAL AND FREE
PSA FREE: 0.45 ng/mL
PSA, Free Pct: 52 % (ref >25)
PSA: 0.87 ng/mL (ref <=4.00)

## 2016-01-31 ENCOUNTER — Encounter: Payer: Self-pay | Admitting: Internal Medicine

## 2016-01-31 DIAGNOSIS — I10 Essential (primary) hypertension: Secondary | ICD-10-CM

## 2016-02-01 MED ORDER — AMLODIPINE BESYLATE 5 MG PO TABS: 5.0000 mg | ORAL_TABLET | Freq: Every day | ORAL | Status: DC

## 2016-02-03 ENCOUNTER — Other Ambulatory Visit: Payer: Self-pay | Admitting: Internal Medicine

## 2016-02-03 DIAGNOSIS — I10 Essential (primary) hypertension: Secondary | ICD-10-CM

## 2016-02-03 MED ORDER — AMLODIPINE BESYLATE 5 MG PO TABS: 5.0000 mg | ORAL_TABLET | Freq: Every day | ORAL | Status: DC

## 2016-02-07 ENCOUNTER — Other Ambulatory Visit: Payer: PPO

## 2016-03-02 ENCOUNTER — Encounter: Payer: Self-pay | Admitting: Internal Medicine

## 2016-03-02 ENCOUNTER — Ambulatory Visit (INDEPENDENT_AMBULATORY_CARE_PROVIDER_SITE_OTHER)
Admission: RE | Admit: 2016-03-02 | Discharge: 2016-03-02 | Disposition: A | Discharge status: Home / Self Care | Payer: PPO | Source: Ambulatory Visit | Attending: Internal Medicine | Admitting: Internal Medicine

## 2016-03-02 ENCOUNTER — Ambulatory Visit (INDEPENDENT_AMBULATORY_CARE_PROVIDER_SITE_OTHER): Payer: PPO | Admitting: Internal Medicine

## 2016-03-02 VITALS — BP 148/74 | HR 83 | Ht 77.0 in | Wt 239.6 lb

## 2016-03-02 DIAGNOSIS — I442 Atrioventricular block, complete: Secondary | ICD-10-CM

## 2016-03-02 DIAGNOSIS — G8929 Other chronic pain: Secondary | ICD-10-CM

## 2016-03-02 DIAGNOSIS — R51 Headache: Secondary | ICD-10-CM | POA: Diagnosis not present

## 2016-03-02 DIAGNOSIS — I10 Essential (primary) hypertension: Secondary | ICD-10-CM

## 2016-03-02 LAB — CUP PACEART INCLINIC DEVICE CHECK
Battery Remaining Longevity: 99 mo
Battery Voltage: 2.78 V
Brady Statistic AP VP Percent: 4 %
Date Time Interrogation Session: 20170420163443
Implantable Lead Implant Date: 20120302
Implantable Lead Location: 753859
Implantable Lead Location: 753860
Lead Channel Pacing Threshold Amplitude: 0.5 V
Lead Channel Sensing Intrinsic Amplitude: 4 mV
Lead Channel Setting Pacing Amplitude: 2.5 V
Lead Channel Setting Pacing Pulse Width: 0.4 ms
MDC IDC LEAD IMPLANT DT: 20120302
MDC IDC LEAD MODEL: 5086
MDC IDC MSMT BATTERY IMPEDANCE: 325 Ohm
MDC IDC MSMT LEADCHNL RA IMPEDANCE VALUE: 487 Ohm
MDC IDC MSMT LEADCHNL RA PACING THRESHOLD AMPLITUDE: 0.75 V
MDC IDC MSMT LEADCHNL RA PACING THRESHOLD PULSEWIDTH: 0.4 ms
MDC IDC MSMT LEADCHNL RV IMPEDANCE VALUE: 619 Ohm
MDC IDC MSMT LEADCHNL RV PACING THRESHOLD PULSEWIDTH: 0.4 ms
MDC IDC SET LEADCHNL RA PACING AMPLITUDE: 2 V
MDC IDC SET LEADCHNL RV SENSING SENSITIVITY: 2.8 mV
MDC IDC STAT BRADY AP VS PERCENT: 0 %
MDC IDC STAT BRADY AS VP PERCENT: 96 %
MDC IDC STAT BRADY AS VS PERCENT: 0 %

## 2016-03-02 MED ORDER — TADALAFIL 20 MG PO TABS: ORAL_TABLET | ORAL | Status: DC

## 2016-03-02 NOTE — Progress Notes (Signed)
Electrophysiology Office Note   Date:  03/02/2016   ID:  [name] 01/29/1946, MRN [id number]  PCP:  Binnie Rail, MD   Primary Electrophysiologist: Thompson Grayer, MD    Chief Complaint  Patient presents with  . Follow-up     History of Present Illness: [name] is a 70 y.o. male who presents today for electrophysiology evaluation.   He is doing very well.  He has retired and spends most of his time walking on [employer] or playing golf.  No cardiac complaints. Today, he denies symptoms of chest pain, shortness of breath, orthopnea, PND, lower extremity edema, claudication,   bleeding, or neurologic sequela. The patient is tolerating medications without difficulties and is otherwise without complaint today.    Past Medical History  Diagnosis Date  . Hyperlipidemia   . Coronary artery disease     Non-obstructive. Pt had left heart catheterization in August 2009 showing a 30% mid-circumflex lesion, otherwise coronaries were clean angiographically  . Hypertension   . OSA (obstructive sleep apnea)     Probable; study never scheduled  . Complete heart block (HCC)     S/P  PPM by Dr Rayann Heman   Past Surgical History  Procedure Laterality Date  . Cardiac catheterization  1993, 2009    2009- negative  . Pacemaker insertion  01/13/11    MDT by Greggory Brandy for complete heart block  . Colonoscopy  2011    negative, [facility]     Current Outpatient Prescriptions  Medication Sig Dispense Refill  . allopurinol (ZYLOPRIM) 300 MG tablet Take 0.5 tablets (150 mg total) by mouth daily. 45 tablet 3  . clobetasol (TEMOVATE) 0.05 % cream Apply 1 application topically daily as needed (itching).     . fish oil-omega-3 fatty acids 1000 MG capsule Take 1 g by mouth 2 (two) times daily.      Marland Kitchen losartan (COZAAR) 100 MG tablet Take 1 tablet (100 mg total) by mouth daily. 90 tablet 3  . simvastatin (ZOCOR) 40 MG tablet TAKE 1 TABLET (40 MG TOTAL) BY MOUTH AT BEDTIME. 90 tablet 3  .  tadalafil (CIALIS) 20 MG tablet TAKE 1 TABLET EVERY 3 DAYS AS NEEDED ONLY 8 tablet 3   No current facility-administered medications for this visit.    Allergies:   Atorvastatin; Sildenafil; and Ramipril   Social History:  The patient  reports that he quit smoking about 10 years ago. He does not have any smokeless tobacco history on file. He reports that he drinks alcohol. He reports that he does not use illicit drugs.   Family History:  The patient's family history includes Arrhythmia in his mother; Cancer in his mother; Diabetes in his father; Gout in his brother; Heart attack (age of onset: 36) in his maternal grandfather; Leukemia in his brother; Other in his sister; Peripheral vascular disease in his mother. There is no history of Stroke.    ROS:  Please see the history of present illness.   All other systems are reviewed and negative.    PHYSICAL EXAM: VS:  BP 148/74 mmHg  Pulse 83  Ht 6\' 5"  (1.956 m)  Wt 239 lb 9.6 oz (108.682 kg)  BMI 28.41 kg/m2  SpO2 94% , BMI Body mass index is 28.41 kg/(m^2). GEN: Well nourished, well developed, in no acute distress HEENT: normal Neck: no JVD, carotid bruits, or masses Cardiac: RRR; no murmurs, rubs, or gallops,no edema  Respiratory:  clear to auscultation bilaterally, normal work of  breathing GI: soft, nontender, nondistended, + BS MS: no deformity or atrophy Skin: appears over sun exposed, device pocket is well healed Neuro:  Strength and sensation are intact Psych: euthymic mood, full affect  Device interrogation is reviewed today in detail.  See PaceArt for details. ekg today reveals sinus rhythm, V paced  Recent Labs: 07/14/2015: Magnesium 1.9 01/20/2016: ALT 60*; BUN 16; Creatinine, Ser 0.97; Hemoglobin 15.0; Platelets 181.0; Potassium 4.3; Sodium 140; TSH 1.36    Lipid Panel     Component Value Date/Time   CHOL 188 01/20/2016 1026   CHOL 192 09/09/2014 1013   TRIG 181.0* 01/20/2016 1026   TRIG 171* 09/09/2014 1013   HDL  57.90 01/20/2016 1026   HDL 56 09/09/2014 1013   CHOLHDL 3 01/20/2016 1026   VLDL 36.2 01/20/2016 1026   LDLCALC 94 01/20/2016 1026   LDLCALC 102* 09/09/2014 1013   LDLDIRECT 109.1 08/13/2008 1030     Wt Readings from Last 3 Encounters:  03/02/16 239 lb 9.6 oz (108.682 kg)  01/20/16 242 lb (109.77 kg)  07/14/15 243 lb (110.224 kg)     ASSESSMENT AND PLAN:  1. Complete heart block Normal pacemaker function See Pace Art report No changes today  His bipolar R waves are low.  He has done well with unipolar sensing. He is enrolled in the [place] with [employer]  2. HTN Stable No change required today  3. HL Lipids are reviewed today, triglycerides are a little high No changes  carelink Return in 1 year   Current medicines are reviewed at length with the patient today.   The patient does not have concerns regarding his medicines.  The following changes were made today:  none   Signed, Thompson Grayer, MD  03/02/2016 4:12 PM     [name] [id number] [phone] (office) [phone] (fax)

## 2016-03-02 NOTE — Patient Instructions (Signed)
Medication Instructions:  Your physician recommends that you continue on your current medications as directed. Please refer to the Current Medication list given to you today.   Labwork: None ordered   Testing/Procedures: None ordered  Follow-Up:  Your physician wants you to follow-up in: 12 months with Dr Rayann Heman Dennis Bast will receive a reminder letter in the mail two months in advance. If you don't receive a letter, please call our office to schedule the follow-up appointment.   Remote monitoring is used to monitor your Pacemaker  from home. This monitoring reduces the number of office visits required to check your device to one time per year. It allows [place] to keep an eye on the functioning of your device to ensure it is working properly. You are scheduled for a device check from home on 06/01/16. You may send your transmission at any time that day. If you have a wireless device, the transmission will be sent automatically. After your physician reviews your transmission, you will receive a postcard with your next transmission date.    Any Other Special Instructions Will Be Listed Below (If Applicable).     If you need a refill on your cardiac medications before your next appointment, please call your pharmacy.

## 2016-03-07 ENCOUNTER — Telehealth: Payer: Self-pay | Admitting: Emergency Medicine

## 2016-03-07 NOTE — Telephone Encounter (Signed)
Spoke with pt. He stated that it is about every day that he has a headache from about 12pm on. He takes advil and it does seem to help with the headache. Please advise if you are okay with him taking Advil.

## 2016-03-07 NOTE — Telephone Encounter (Signed)
He should not take advil daily because that can cause rebound headaches.  We can try a preventive medication called topamax that he will take at night - he would need to take it nightly and we would increase it if needed.  Let  Me know if he wants to try this.

## 2016-03-07 NOTE — Telephone Encounter (Signed)
-----   Message from Binnie Rail, MD sent at 03/03/2016  8:59 PM EDT ----- Find out how often he is having the headaches and what has worked/not worked

## 2016-03-09 MED ORDER — TOPIRAMATE 25 MG PO TABS: 25.0000 mg | ORAL_TABLET | Freq: Every day | ORAL | Status: DC

## 2016-03-09 NOTE — Telephone Encounter (Signed)
Pt notified via My Chart

## 2016-03-09 NOTE — Telephone Encounter (Signed)
Take one tab at bedtime - if tolerates it - may need to increase it

## 2016-03-09 NOTE — Telephone Encounter (Signed)
Pt is okay with trying topamax. Asked it be sent to [facility] in [facility].

## 2016-03-26 ENCOUNTER — Encounter: Payer: Self-pay | Admitting: Internal Medicine

## 2016-04-18 ENCOUNTER — Encounter: Payer: Self-pay | Admitting: Internal Medicine

## 2016-04-20 MED ORDER — TOPIRAMATE 50 MG PO TABS: 50.0000 mg | ORAL_TABLET | Freq: Every day | ORAL | Status: DC

## 2016-06-01 ENCOUNTER — Ambulatory Visit (INDEPENDENT_AMBULATORY_CARE_PROVIDER_SITE_OTHER): Payer: PPO | Admitting: *Deleted

## 2016-06-01 DIAGNOSIS — I442 Atrioventricular block, complete: Secondary | ICD-10-CM | POA: Diagnosis not present

## 2016-06-01 NOTE — Progress Notes (Signed)
Remote pacemaker transmission.   

## 2016-06-02 ENCOUNTER — Encounter: Payer: Self-pay | Admitting: Cardiology

## 2016-06-13 LAB — CUP PACEART REMOTE DEVICE CHECK
Battery Voltage: 2.78 V
Brady Statistic AP VP Percent: 4 %
Brady Statistic AP VS Percent: 0 %
Brady Statistic AS VP Percent: 96 %
Date Time Interrogation Session: 20170720114423
Implantable Lead Location: 753860
Implantable Lead Model: 5076
Implantable Lead Model: 5086
Lead Channel Pacing Threshold Amplitude: 0.625 V
Lead Channel Pacing Threshold Amplitude: 0.75 V
Lead Channel Pacing Threshold Pulse Width: 0.4 ms
Lead Channel Pacing Threshold Pulse Width: 0.4 ms
Lead Channel Sensing Intrinsic Amplitude: 2.8 mV
Lead Channel Setting Sensing Sensitivity: 2.8 mV
MDC IDC LEAD IMPLANT DT: 20120302
MDC IDC LEAD IMPLANT DT: 20120302
MDC IDC LEAD LOCATION: 753859
MDC IDC MSMT BATTERY IMPEDANCE: 373 Ohm
MDC IDC MSMT BATTERY REMAINING LONGEVITY: 95 mo
MDC IDC MSMT LEADCHNL RA IMPEDANCE VALUE: 486 Ohm
MDC IDC MSMT LEADCHNL RV IMPEDANCE VALUE: 646 Ohm
MDC IDC SET LEADCHNL RA PACING AMPLITUDE: 2 V
MDC IDC SET LEADCHNL RV PACING AMPLITUDE: 2.5 V
MDC IDC SET LEADCHNL RV PACING PULSEWIDTH: 0.4 ms
MDC IDC STAT BRADY AS VS PERCENT: 0 %

## 2016-07-14 DIAGNOSIS — L818 Other specified disorders of pigmentation: Secondary | ICD-10-CM | POA: Diagnosis not present

## 2016-07-14 DIAGNOSIS — X32XXXD Exposure to sunlight, subsequent encounter: Secondary | ICD-10-CM | POA: Diagnosis not present

## 2016-07-14 DIAGNOSIS — D225 Melanocytic nevi of trunk: Secondary | ICD-10-CM | POA: Diagnosis not present

## 2016-07-14 DIAGNOSIS — L570 Actinic keratosis: Secondary | ICD-10-CM | POA: Diagnosis not present

## 2016-07-14 DIAGNOSIS — Z1283 Encounter for screening for malignant neoplasm of skin: Secondary | ICD-10-CM | POA: Diagnosis not present

## 2016-08-31 ENCOUNTER — Ambulatory Visit (INDEPENDENT_AMBULATORY_CARE_PROVIDER_SITE_OTHER): Payer: PPO | Admitting: *Deleted

## 2016-08-31 DIAGNOSIS — I442 Atrioventricular block, complete: Secondary | ICD-10-CM

## 2016-08-31 NOTE — Progress Notes (Signed)
Remote pacemaker transmission.   

## 2016-09-01 ENCOUNTER — Encounter: Payer: Self-pay | Admitting: Cardiology

## 2016-09-14 ENCOUNTER — Encounter: Payer: Self-pay | Admitting: Internal Medicine

## 2016-09-14 ENCOUNTER — Ambulatory Visit (INDEPENDENT_AMBULATORY_CARE_PROVIDER_SITE_OTHER): Payer: PPO | Admitting: Internal Medicine

## 2016-09-14 ENCOUNTER — Other Ambulatory Visit (INDEPENDENT_AMBULATORY_CARE_PROVIDER_SITE_OTHER): Payer: PPO

## 2016-09-14 VITALS — BP 126/84 | HR 73 | Temp 97.9°F | Ht 77.0 in | Wt 241.0 lb

## 2016-09-14 DIAGNOSIS — E7800 Pure hypercholesterolemia, unspecified: Secondary | ICD-10-CM

## 2016-09-14 DIAGNOSIS — I10 Essential (primary) hypertension: Secondary | ICD-10-CM

## 2016-09-14 DIAGNOSIS — Z8739 Personal history of other diseases of the musculoskeletal system and connective tissue: Secondary | ICD-10-CM

## 2016-09-14 DIAGNOSIS — Z23 Encounter for immunization: Secondary | ICD-10-CM

## 2016-09-14 LAB — COMPREHENSIVE METABOLIC PANEL
ALBUMIN: 4.5 g/dL (ref 3.5–5.2)
ALK PHOS: 44 U/L (ref 39–117)
ALT: 58 U/L — AB (ref 0–53)
AST: 56 U/L — AB (ref 0–37)
BILIRUBIN TOTAL: 0.8 mg/dL (ref 0.2–1.2)
BUN: 19 mg/dL (ref 6–23)
CALCIUM: 9.7 mg/dL (ref 8.4–10.5)
CO2: 27 meq/L (ref 19–32)
CREATININE: 1.07 mg/dL (ref 0.40–1.50)
Chloride: 106 mEq/L (ref 96–112)
GFR: 72.89 mL/min (ref >60.00)
Glucose, Bld: 104 mg/dL — ABNORMAL HIGH (ref 70–99)
Potassium: 4.2 mEq/L (ref 3.5–5.1)
Sodium: 141 mEq/L (ref 135–145)
TOTAL PROTEIN: 6.9 g/dL (ref 6.0–8.3)

## 2016-09-14 LAB — LIPID PANEL
CHOL/HDL RATIO: 3
Cholesterol: 201 mg/dL — ABNORMAL HIGH (ref 0–200)
HDL: 59.4 mg/dL (ref >39.00)
LDL CALC: 103 mg/dL — AB (ref 0–99)
NONHDL: 141.43
TRIGLYCERIDES: 191 mg/dL — AB (ref 0.0–149.0)
VLDL: 38.2 mg/dL (ref 0.0–40.0)

## 2016-09-14 LAB — URIC ACID: URIC ACID, SERUM: 4.8 mg/dL (ref 4.0–7.8)

## 2016-09-14 NOTE — Assessment & Plan Note (Signed)
Check lipid panel, CMP Continue statin 

## 2016-09-14 NOTE — Progress Notes (Signed)
Pre visit review using our clinic review tool, if applicable. No additional management support is needed unless otherwise documented below in the visit note. 

## 2016-09-14 NOTE — Progress Notes (Signed)
Subjective:    Patient ID: [name], male    DOB: September 02, 1946, 70 y.o.   MRN: [id number]  HPI He is here for follow up.  Headaches: he takes the topamax daily.  He has headaches less than once a week.  He has started putting pressure on his ear lobes once daily and that has helped.  Prior to starting that he had daily headaches. His headaches usually come in the afternoon, he denies morning headaches.  History of gout:  He is taking allopurinol daily. He has changed his diet and he thinks that has helped. He denies any episodes since he was here last.  Hyperlipidemia: He is taking his medication daily. He is compliant with a low fat/cholesterol diet. He is exercising regularly - three days a week, golf three days a week. He denies myalgias.   Hypertension: He is taking his medication daily. He is compliant with a low sodium diet.  He denies chest pain, palpitations, edema, shortness of breath and lightheadedness.  He is exercising regularly.    Medications and allergies reviewed with patient and updated if appropriate.  Patient Active Problem List   Diagnosis Date Noted  . Non-compliant behavior 07/12/2015  . Snoring 02/15/2015  . Elevated liver enzymes 09/13/2014  . Dupuytren's contracture of both hands 09/02/2013  . History of gout 12/27/2011  . Essential hypertension 01/12/2011  . Complete heart block ([place]) 01/12/2011  . FATIGUE 01/12/2011  . Hyperglycemia 03/02/2009  . HYPERPLASIA PROSTATE UNS W/O UR OBST & OTH LUTS 08/13/2008  . DIZZINESS AND GIDDINESS 06/04/2008  . NONSPECIFIC ABNORMAL ELECTROCARDIOGRAM 06/04/2008  . OBSTRUCTIVE SLEEP APNEA 04/14/2008  . Hyperlipidemia 02/17/2008  . PAROXYSMAL SUPRAVENTRICULAR TACHYCARDIA 02/17/2008  . HYPERSOMNIA, ASSOCIATED WITH SLEEP APNEA 02/17/2008  . MITRAL VALVE PROLAPSE 04/17/2007    Current Outpatient Prescriptions on File Prior to Visit  Medication Sig Dispense Refill  . allopurinol (ZYLOPRIM) 300 MG tablet Take 0.5  tablets (150 mg total) by mouth daily. 45 tablet 3  . clobetasol (TEMOVATE) 0.05 % cream Apply 1 application topically daily as needed (itching).     . fish oil-omega-3 fatty acids 1000 MG capsule Take 1 g by mouth 2 (two) times daily.      Marland Kitchen losartan (COZAAR) 100 MG tablet Take 1 tablet (100 mg total) by mouth daily. 90 tablet 3  . simvastatin (ZOCOR) 40 MG tablet TAKE 1 TABLET (40 MG TOTAL) BY MOUTH AT BEDTIME. 90 tablet 3  . tadalafil (CIALIS) 20 MG tablet TAKE 1 TABLET EVERY 3 DAYS AS NEEDED ONLY 8 tablet 3  . topiramate (TOPAMAX) 50 MG tablet Take 1 tablet (50 mg total) by mouth at bedtime. 90 tablet 1   No current facility-administered medications on file prior to visit.     Past Medical History:  Diagnosis Date  . Complete heart block ([facility]CC)    S/P  PPM by Dr Rayann Heman  . Coronary artery disease    Non-obstructive. Pt had left heart catheterization in August 2009 showing a 30% mid-circumflex lesion, otherwise coronaries were clean angiographically  . Hyperlipidemia   . Hypertension   . OSA (obstructive sleep apnea)    Probable; study never scheduled    Past Surgical History:  Procedure Laterality Date  . [place], 2009   2009- negative  . COLONOSCOPY  2011   negative, [facility]  . PACEMAKER INSERTION  01/13/11   MDT by Greggory Brandy for complete heart block    Social History   Social History  .  Marital status: Married    Spouse name: N/A  . Number of children: N/A  . Years of education: N/A   Social History Main Topics  . Smoking status: Former Smoker    Quit date: 11/13/2005  . Smokeless tobacco: None     Comment: smoked 1965- 2007, up to 2 ppd  . Alcohol use Yes     Comment:  21 drinks/ week  . Drug use: No  . Sexual activity: Not Asked   Other Topics Concern  . None   Social History Narrative   Exercise: regularly - golf and walks - does both a few times a week    Family History  Problem Relation Age of Onset  . Heart attack Maternal  Grandfather 56  . Diabetes Father   . Peripheral vascular disease Mother   . Cancer Mother     ? primary  . Arrhythmia Mother     AFIB  . Gout Brother   . Leukemia Brother   . Stroke Neg Hx   . Other Sister     prediabetic    Review of Systems  Constitutional: Negative for chills and fever.  Respiratory: Negative for cough, shortness of breath and wheezing.   Cardiovascular: Negative for chest pain, palpitations and leg swelling.  Neurological: Positive for headaches. Negative for dizziness and light-headedness.       Objective:   Vitals:   09/14/16 0925  BP: 126/84  Pulse: 73  Temp: 97.9 F (36.6 C)   Filed Weights   09/14/16 0925  Weight: 241 lb (109.3 kg)   Body mass index is 28.58 kg/m.   Physical Exam Constitutional: Appears well-developed and well-nourished. No distress.  HENT:  Head: Normocephalic and atraumatic.  Neck: Neck supple. No tracheal deviation present. No thyromegaly present.  No cervical lymphadenopathy Cardiovascular: Normal rate, regular rhythm and normal heart sounds.   No murmur heard. No carotid bruit .  No edema Pulmonary/Chest: Effort normal and breath sounds normal. No respiratory distress. No has no wheezes. No rales.  Skin: Skin is warm and dry. Not diaphoretic.  Psychiatric: Normal mood and affect. Behavior is normal.        Assessment & Plan:   See Problem List for Assessment and Plan of chronic medical problems.

## 2016-09-14 NOTE — Patient Instructions (Signed)
  Test(s) ordered today. Your results will be released to MyChart (or called to you) after review, usually within 72hours after test completion. If any changes need to be made, you will be notified at that same time.  All other Health Maintenance issues reviewed.   All recommended immunizations and age-appropriate screenings are up-to-date or discussed.  Flu vaccine administered today.   Medications reviewed and updated.  No changes recommended at this time.    Please followup in 6 months   

## 2016-09-14 NOTE — Assessment & Plan Note (Signed)
Taking allopurinol daily No episodes of gout Check uric acid level

## 2016-09-14 NOTE — Assessment & Plan Note (Signed)
BP well controlled Current regimen effective and well tolerated Continue current medications at current doses cmp  

## 2016-09-16 ENCOUNTER — Encounter: Payer: Self-pay | Admitting: Internal Medicine

## 2016-09-18 ENCOUNTER — Other Ambulatory Visit: Payer: Self-pay | Admitting: Internal Medicine

## 2016-09-26 LAB — CUP PACEART REMOTE DEVICE CHECK
Battery Impedance: 397 Ohm
Battery Remaining Longevity: 93 mo
Battery Voltage: 2.78 V
Brady Statistic AP VP Percent: 4 %
Brady Statistic AS VS Percent: 0 %
Implantable Lead Implant Date: 20120302
Implantable Lead Model: 5086
Lead Channel Pacing Threshold Amplitude: 0.75 V
Lead Channel Pacing Threshold Pulse Width: 0.4 ms
Lead Channel Sensing Intrinsic Amplitude: 2.8 mV
Lead Channel Setting Pacing Amplitude: 2.5 V
Lead Channel Setting Pacing Pulse Width: 0.4 ms
Lead Channel Setting Sensing Sensitivity: 2.8 mV
MDC IDC LEAD IMPLANT DT: 20120302
MDC IDC LEAD LOCATION: 753859
MDC IDC LEAD LOCATION: 753860
MDC IDC MSMT LEADCHNL RA IMPEDANCE VALUE: 459 Ohm
MDC IDC MSMT LEADCHNL RV IMPEDANCE VALUE: 644 Ohm
MDC IDC MSMT LEADCHNL RV PACING THRESHOLD AMPLITUDE: 0.625 V
MDC IDC MSMT LEADCHNL RV PACING THRESHOLD PULSEWIDTH: 0.4 ms
MDC IDC PG IMPLANT DT: 20120302
MDC IDC SESS DTM: 20171019112721
MDC IDC SET LEADCHNL RA PACING AMPLITUDE: 2 V
MDC IDC STAT BRADY AP VS PERCENT: 0 %
MDC IDC STAT BRADY AS VP PERCENT: 96 %

## 2016-11-30 ENCOUNTER — Ambulatory Visit (INDEPENDENT_AMBULATORY_CARE_PROVIDER_SITE_OTHER): Payer: PPO | Admitting: *Deleted

## 2016-11-30 DIAGNOSIS — I442 Atrioventricular block, complete: Secondary | ICD-10-CM

## 2016-12-01 LAB — CUP PACEART REMOTE DEVICE CHECK
Battery Impedance: 398 Ohm
Battery Remaining Longevity: 93 mo
Brady Statistic AP VP Percent: 4 %
Brady Statistic AS VP Percent: 96 %
Date Time Interrogation Session: 20180118160046
Implantable Lead Implant Date: 20120302
Implantable Lead Location: 753859
Implantable Lead Location: 753860
Implantable Lead Model: 5076
Implantable Pulse Generator Implant Date: 20120302
Lead Channel Impedance Value: 472 Ohm
Lead Channel Pacing Threshold Amplitude: 0.625 V
Lead Channel Sensing Intrinsic Amplitude: 2.8 mV
Lead Channel Setting Pacing Pulse Width: 0.4 ms
MDC IDC LEAD IMPLANT DT: 20120302
MDC IDC MSMT BATTERY VOLTAGE: 2.78 V
MDC IDC MSMT LEADCHNL RA PACING THRESHOLD PULSEWIDTH: 0.4 ms
MDC IDC MSMT LEADCHNL RV IMPEDANCE VALUE: 640 Ohm
MDC IDC MSMT LEADCHNL RV PACING THRESHOLD AMPLITUDE: 0.5 V
MDC IDC MSMT LEADCHNL RV PACING THRESHOLD PULSEWIDTH: 0.4 ms
MDC IDC SET LEADCHNL RA PACING AMPLITUDE: 2 V
MDC IDC SET LEADCHNL RV PACING AMPLITUDE: 2.5 V
MDC IDC SET LEADCHNL RV SENSING SENSITIVITY: 2.8 mV
MDC IDC STAT BRADY AP VS PERCENT: 0 %
MDC IDC STAT BRADY AS VS PERCENT: 0 %

## 2016-12-01 NOTE — Progress Notes (Signed)
Remote pacemaker transmission.   

## 2016-12-06 ENCOUNTER — Encounter: Payer: Self-pay | Admitting: Cardiology

## 2017-01-17 ENCOUNTER — Other Ambulatory Visit: Payer: Self-pay | Admitting: Internal Medicine

## 2017-01-22 ENCOUNTER — Other Ambulatory Visit: Payer: Self-pay | Admitting: Internal Medicine

## 2017-02-12 NOTE — Assessment & Plan Note (Signed)
Recheck lfts

## 2017-02-12 NOTE — Assessment & Plan Note (Signed)
Check a1c 

## 2017-02-12 NOTE — Patient Instructions (Addendum)
  Test(s) ordered today. Your results will be released to [place] (or called to you) after review, usually within 72hours after test completion. If any changes need to be made, you will be notified at that same time.   No immunizations administered today.   Medications reviewed and updated.  Changes include  Trial of generic viagra. We will increase the topamax to 50 mg twice daily.  Your prescription(s) have been submitted to your pharmacy. Please take as directed and contact our office if you believe you are having problem(s) with the medication(s).   Please followup in 6 motnhs

## 2017-02-12 NOTE — Progress Notes (Signed)
Subjective:    Patient ID: [name], male    DOB: 05/04/1946, 71 y.o.   MRN: [id number]  HPI The patient is here for follow up.  Hypertension: He is taking his medication daily. He is compliant with a low sodium diet.  He denies chest pain, palpitations, edema, shortness of breath and lightheadedness. He is exercising regularly.  He does not monitor his blood pressure at home.    Hyperlipidemia: He is taking his medication daily. He is compliant with a low fat/cholesterol diet. He is exercising regularly. He denies myalgias.   Hyperglycemia:  He limits his sugar intake.  He is execising - walking and playing golf.    h/o gout:  He is taking his medication daily.  He denies any gout flares.   Headaches:  He continues to have frequent headaches.  The topamax has helped.  advil or aelve helps when he takes it as needed.   ED:  He uses cialis as needed.  It is more expensive this year and he wonders if there are alternatives.    Medications and allergies reviewed with patient and updated if appropriate.  Patient Active Problem List   Diagnosis Date Noted  . Snoring 02/15/2015  . Elevated liver enzymes 09/13/2014  . Dupuytren's contracture of both hands 09/02/2013  . History of gout 12/27/2011  . Essential hypertension 01/12/2011  . Complete heart block ([place]) 01/12/2011  . FATIGUE 01/12/2011  . Hyperglycemia 03/02/2009  . HYPERPLASIA PROSTATE UNS W/O UR OBST & OTH LUTS 08/13/2008  . DIZZINESS AND GIDDINESS 06/04/2008  . NONSPECIFIC ABNORMAL ELECTROCARDIOGRAM 06/04/2008  . Hyperlipidemia 02/17/2008  . PAROXYSMAL SUPRAVENTRICULAR TACHYCARDIA 02/17/2008  . MITRAL VALVE PROLAPSE 04/17/2007    Current Outpatient Prescriptions on File Prior to Visit  Medication Sig Dispense Refill  . allopurinol (ZYLOPRIM) 300 MG tablet TAKE 0.5 TABLETS (150 MG TOTAL) BY MOUTH DAILY. 45 tablet 0  . clobetasol (TEMOVATE) 0.05 % cream Apply 1 application topically daily as needed (itching).      . fish oil-omega-3 fatty acids 1000 MG capsule Take 1 g by mouth 2 (two) times daily.      Marland Kitchen losartan (COZAAR) 100 MG tablet TAKE 1 TABLET (100 MG TOTAL) BY MOUTH DAILY. 90 tablet 0  . simvastatin (ZOCOR) 40 MG tablet TAKE 1 TABLET (40 MG TOTAL) BY MOUTH AT BEDTIME. 90 tablet 0  . tadalafil (CIALIS) 20 MG tablet TAKE 1 TABLET EVERY 3 DAYS AS NEEDED ONLY 8 tablet 3   No current facility-administered medications on file prior to visit.     Past Medical History:  Diagnosis Date  . Complete heart block (HCC)    S/P  PPM by Dr Rayann Heman  . Coronary artery disease    Non-obstructive. Pt had left heart catheterization in August 2009 showing a 30% mid-circumflex lesion, otherwise coronaries were clean angiographically  . Hyperlipidemia   . Hypertension   . OSA (obstructive sleep apnea)    Probable; study never scheduled    Past Surgical History:  Procedure Laterality Date  . [place], 2009   2009- negative  . COLONOSCOPY  2011   negative, [facility]  . PACEMAKER INSERTION  01/13/11   MDT by Greggory Brandy for complete heart block    Social History   Social History  . Marital status: Married    Spouse name: N/A  . Number of children: N/A  . Years of education: N/A   Social History Main Topics  . Smoking status: Former Smoker  Quit date: 11/13/2005  . Smokeless tobacco: Not on file     Comment: smoked 1965- 2007, up to 2 ppd  . Alcohol use Yes     Comment:  21 drinks/ week  . Drug use: No  . Sexual activity: Not on file   Other Topics Concern  . Not on file   Social History Narrative   Exercise: regularly - golf and walks - does both a few times a week    Family History  Problem Relation Age of Onset  . Heart attack Maternal Grandfather 56  . Diabetes Father   . Peripheral vascular disease Mother   . Cancer Mother     ? primary  . Arrhythmia Mother     AFIB  . Gout Brother   . Leukemia Brother   . Stroke Neg Hx   . Other Sister     prediabetic     Review of Systems  Constitutional: Negative for chills and fever.  Respiratory: Negative for cough, shortness of breath and wheezing.   Cardiovascular: Negative for chest pain, palpitations and leg swelling.  Musculoskeletal:       Occ leg cramps  Neurological: Positive for headaches.       Objective:   Vitals:   02/13/17 0832  BP: 120/78  Pulse: 79  Temp: 98 F (36.7 C)   Wt Readings from Last 3 Encounters:  02/13/17 238 lb 1.3 oz (108 kg)  09/14/16 241 lb (109.3 kg)  03/02/16 239 lb 9.6 oz (108.7 kg)   Body mass index is 28.23 kg/m.   Physical Exam    Constitutional: Appears well-developed and well-nourished. No distress.  HENT:  Head: Normocephalic and atraumatic.  Neck: Neck supple. No tracheal deviation present. No thyromegaly present.  No cervical lymphadenopathy Cardiovascular: Normal rate, regular rhythm and normal heart sounds.   No murmur heard. No carotid bruit .  No edema Pulmonary/Chest: Effort normal and breath sounds normal. No respiratory distress. No has no wheezes. No rales.  Skin: Skin is warm and dry. Not diaphoretic.  Psychiatric: Normal mood and affect. Behavior is normal.      Assessment & Plan:    See Problem List for Assessment and Plan of chronic medical problems.

## 2017-02-13 ENCOUNTER — Encounter: Payer: Self-pay | Admitting: Internal Medicine

## 2017-02-13 ENCOUNTER — Ambulatory Visit (INDEPENDENT_AMBULATORY_CARE_PROVIDER_SITE_OTHER): Payer: PPO | Admitting: Internal Medicine

## 2017-02-13 ENCOUNTER — Other Ambulatory Visit (INDEPENDENT_AMBULATORY_CARE_PROVIDER_SITE_OTHER): Payer: PPO

## 2017-02-13 VITALS — BP 120/78 | HR 79 | Temp 98.0°F | Ht 77.0 in | Wt 238.1 lb

## 2017-02-13 DIAGNOSIS — Z8739 Personal history of other diseases of the musculoskeletal system and connective tissue: Secondary | ICD-10-CM

## 2017-02-13 DIAGNOSIS — I10 Essential (primary) hypertension: Secondary | ICD-10-CM | POA: Diagnosis not present

## 2017-02-13 DIAGNOSIS — R748 Abnormal levels of other serum enzymes: Secondary | ICD-10-CM | POA: Diagnosis not present

## 2017-02-13 DIAGNOSIS — E7800 Pure hypercholesterolemia, unspecified: Secondary | ICD-10-CM

## 2017-02-13 DIAGNOSIS — R739 Hyperglycemia, unspecified: Secondary | ICD-10-CM | POA: Diagnosis not present

## 2017-02-13 DIAGNOSIS — R42 Dizziness and giddiness: Secondary | ICD-10-CM

## 2017-02-13 DIAGNOSIS — R51 Headache: Secondary | ICD-10-CM | POA: Diagnosis not present

## 2017-02-13 DIAGNOSIS — R519 Headache, unspecified: Secondary | ICD-10-CM | POA: Insufficient documentation

## 2017-02-13 DIAGNOSIS — G8929 Other chronic pain: Secondary | ICD-10-CM | POA: Insufficient documentation

## 2017-02-13 LAB — COMPREHENSIVE METABOLIC PANEL
ALT: 51 U/L (ref 0–53)
AST: 50 U/L — ABNORMAL HIGH (ref 0–37)
Albumin: 4.4 g/dL (ref 3.5–5.2)
Alkaline Phosphatase: 46 U/L (ref 39–117)
BUN: 20 mg/dL (ref 6–23)
CHLORIDE: 105 meq/L (ref 96–112)
CO2: 27 mEq/L (ref 19–32)
Calcium: 9.7 mg/dL (ref 8.4–10.5)
Creatinine, Ser: 1.16 mg/dL (ref 0.40–1.50)
GFR: 66.33 mL/min (ref >60.00)
GLUCOSE: 115 mg/dL — AB (ref 70–99)
POTASSIUM: 4 meq/L (ref 3.5–5.1)
Sodium: 139 mEq/L (ref 135–145)
TOTAL PROTEIN: 6.9 g/dL (ref 6.0–8.3)
Total Bilirubin: 0.7 mg/dL (ref 0.2–1.2)

## 2017-02-13 LAB — LIPID PANEL
CHOL/HDL RATIO: 3
Cholesterol: 191 mg/dL (ref 0–200)
HDL: 55.7 mg/dL (ref >39.00)
LDL CALC: 107 mg/dL — AB (ref 0–99)
NONHDL: 135.53
Triglycerides: 142 mg/dL (ref 0.0–149.0)
VLDL: 28.4 mg/dL (ref 0.0–40.0)

## 2017-02-13 LAB — HEMOGLOBIN A1C: Hgb A1c MFr Bld: 5.5 % (ref 4.6–6.5)

## 2017-02-13 MED ORDER — TOPIRAMATE 50 MG PO TABS: 50.0000 mg | ORAL_TABLET | Freq: Two times a day (BID) | ORAL | 1 refills | Status: DC

## 2017-02-13 MED ORDER — SILDENAFIL CITRATE 20 MG PO TABS: ORAL_TABLET | ORAL | 5 refills | Status: DC

## 2017-02-13 NOTE — Assessment & Plan Note (Signed)
Check lipid panel  Continue daily statin Regular exercise and healthy diet encouraged  

## 2017-02-13 NOTE — Assessment & Plan Note (Signed)
No flares since he was here last Continue current dose of allopurinol

## 2017-02-13 NOTE — Progress Notes (Signed)
Pre visit review using our clinic review tool, if applicable. No additional management support is needed unless otherwise documented below in the visit note. 

## 2017-02-13 NOTE — Assessment & Plan Note (Signed)
Taking topamax 50 mg daily - this has helped, but still having frequent headaches Increase topamax to 50 mg twice daily Can refer to neurology if no improvement in headaches.

## 2017-02-13 NOTE — Assessment & Plan Note (Signed)
BP well controlled Current regimen effective and well tolerated Continue current medications at current doses cmp  

## 2017-02-14 ENCOUNTER — Encounter: Payer: Self-pay | Admitting: Internal Medicine

## 2017-02-23 ENCOUNTER — Telehealth: Payer: Self-pay | Admitting: Internal Medicine

## 2017-02-23 NOTE — Telephone Encounter (Signed)
Spoke with aptient and he is out of town and will not make the 4/18 apt.  He is going to keep the appointment for 5/23

## 2017-02-23 NOTE — Telephone Encounter (Signed)
New message   Pt has appt on the 18th of April for pacemaker check and another appointment for office visit on May 23rd with Dr. Rayann Heman. He wants to know why he would need to come in for his pacemaker check if they have always been sent in remotely. He requests a call back about this as soon as possible.

## 2017-02-28 ENCOUNTER — Encounter: Payer: PPO | Admitting: Internal Medicine

## 2017-03-02 ENCOUNTER — Telehealth: Payer: Self-pay | Admitting: Emergency Medicine

## 2017-03-02 DIAGNOSIS — N529 Male erectile dysfunction, unspecified: Secondary | ICD-10-CM

## 2017-03-02 NOTE — Telephone Encounter (Signed)
PA for Sildenafil was denied. Pt stated that he would like an appeal done. The letter for the appeal should include the reason for the appeal and any evidence as to why the medication should be taken and paid for by insurance.

## 2017-03-03 DIAGNOSIS — N529 Male erectile dysfunction, unspecified: Secondary | ICD-10-CM | POA: Insufficient documentation

## 2017-03-03 NOTE — Telephone Encounter (Signed)
Ok to try prior [name] - it is for erectile dysfunction.  I doubt the insurance will pay for it

## 2017-03-06 ENCOUNTER — Telehealth: Payer: Self-pay | Admitting: Internal Medicine

## 2017-03-06 NOTE — Telephone Encounter (Signed)
Attempted to call patient regarding awv. Pt request that not vm be left on his phone. Will try to call patient again at a later time.

## 2017-03-08 ENCOUNTER — Other Ambulatory Visit: Payer: Self-pay | Admitting: Emergency Medicine

## 2017-03-08 MED ORDER — SILDENAFIL CITRATE 20 MG PO TABS: ORAL_TABLET | ORAL | 5 refills | Status: DC

## 2017-03-08 NOTE — Telephone Encounter (Signed)
See 03/08/17 phone note/ refill.

## 2017-03-08 NOTE — Telephone Encounter (Signed)
Spoke with pt to inform that PA was denied for Sildenafil. Informed pt that we could send to [facility] in [place] for a much cheaper out of pocket cost. Pt was okay with that. RX sent to [facility].

## 2017-03-14 ENCOUNTER — Other Ambulatory Visit: Payer: Self-pay | Admitting: Internal Medicine

## 2017-04-04 ENCOUNTER — Ambulatory Visit (INDEPENDENT_AMBULATORY_CARE_PROVIDER_SITE_OTHER): Payer: PPO | Admitting: Internal Medicine

## 2017-04-04 ENCOUNTER — Encounter: Payer: PPO | Admitting: Internal Medicine

## 2017-04-04 VITALS — BP 134/72 | HR 85 | Ht 77.0 in | Wt 238.2 lb

## 2017-04-04 DIAGNOSIS — I10 Essential (primary) hypertension: Secondary | ICD-10-CM | POA: Diagnosis not present

## 2017-04-04 DIAGNOSIS — I442 Atrioventricular block, complete: Secondary | ICD-10-CM | POA: Diagnosis not present

## 2017-04-04 NOTE — Progress Notes (Signed)
   PCP: Binnie Rail, MD  [place] is a 71 y.o. male who presents today for routine electrophysiology followup.  Since last being seen in our clinic, the patient reports doing very well.   He has minor postural dizziness when it is hot and he is playing golf or in the shower.   Today, he denies symptoms of palpitations, chest pain, shortness of breath,  lower extremity edema,  presyncope, or syncope.  The patient is otherwise without complaint today.   Past Medical History:  Diagnosis Date  . Complete heart block (HCC)    S/P  PPM by Dr Rayann Heman  . Coronary artery disease    Non-obstructive. Pt had left heart catheterization in August 2009 showing a 30% mid-circumflex lesion, otherwise coronaries were clean angiographically  . Hyperlipidemia   . Hypertension   . OSA (obstructive sleep apnea)    Probable; study never scheduled   Past Surgical History:  Procedure Laterality Date  . [place], 2009   2009- negative  . COLONOSCOPY  2011   negative, [facility]  . PACEMAKER INSERTION  01/13/11   MDT by Greggory Brandy for complete heart block    ROS- all systems are reviewed and negative except as per HPI above  Current Outpatient Prescriptions  Medication Sig Dispense Refill  . acetaminophen (TYLENOL) 160 MG/5ML liquid Take by mouth every 6 (six) hours as needed for pain (Take as directed per bottle).    Marland Kitchen allopurinol (ZYLOPRIM) 300 MG tablet TAKE 0.5 TABLETS (150 MG TOTAL) BY MOUTH DAILY. 45 tablet 0  . clobetasol (TEMOVATE) 0.05 % cream Apply 1 application topically daily as needed (itching).     . fish oil-omega-3 fatty acids 1000 MG capsule Take 1 g by mouth 2 (two) times daily.      Marland Kitchen ibuprofen (ADVIL,MOTRIN) 200 MG tablet Take 200 mg by mouth every 6 (six) hours as needed (pain).    Marland Kitchen losartan (COZAAR) 100 MG tablet TAKE 1 TABLET (100 MG TOTAL) BY MOUTH DAILY. 90 tablet 0  . sildenafil (REVATIO) 20 MG tablet Take 3-5 pills as needed 50 tablet 5  . simvastatin  (ZOCOR) 40 MG tablet TAKE 1 TABLET (40 MG TOTAL) BY MOUTH AT BEDTIME. 90 tablet 0  . tadalafil (CIALIS) 20 MG tablet TAKE 1 TABLET EVERY 3 DAYS AS NEEDED ONLY 8 tablet 3  . topiramate (TOPAMAX) 50 MG tablet Take 1 tablet (50 mg total) by mouth 2 (two) times daily. 180 tablet 1   No current facility-administered medications for this visit.     Physical Exam: Vitals:   04/04/17 1455  BP: 134/72  Pulse: 85  SpO2: 97%  Weight: 238 lb 3.2 oz (108 kg)  Height: 6\' 5"  (1.956 m)    GEN- The patient is well appearing, alert and oriented x 3 today.   Head- normocephalic, atraumatic Eyes-  Sclera clear, conjunctiva pink Ears- hearing intact Oropharynx- clear Lungs- Clear to ausculation bilaterally, normal work of breathing Chest- pacemaker pocket is well healed Heart- Regular rate and rhythm, no murmurs, rubs or gallops, PMI not laterally displaced GI- soft, NT, ND, + BS Extremities- no clubbing, cyanosis, or edema  Pacemaker interrogation- reviewed in detail today,  See PACEART report  Assessment and Plan:  1. Complete heart block Normal pacemaker function See Pace Art report No changes today  2. HTN Stable No change required today  carelink Return in 1 year  Thompson Grayer MD, [facility] 04/04/2017 3:44 PM

## 2017-04-04 NOTE — Patient Instructions (Signed)
Medication Instructions:  Your physician recommends that you continue on your current medications as directed. Please refer to the Current Medication list given to you today.   Labwork: None ordered   Testing/Procedures: None ordered   Follow-Up: Your physician wants you to follow-up in: 12 months with Dr. Rayann Heman.  You will receive a reminder letter in the mail two months in advance. If you don't receive a letter, please call our office to schedule the follow-up appointment.  Remote monitoring is used to monitor your Pacemaker from home. This monitoring reduces the number of office visits required to check your device to one time per year. It allows [place] to keep an eye on the functioning of your device to ensure it is working properly. You are scheduled for a device check from home on 07/04/17. You may send your transmission at any time that day. If you have a wireless device, the transmission will be sent automatically. After your physician reviews your transmission, you will receive a postcard with your next transmission date.     Any Other Special Instructions Will Be Listed Below (If Applicable).     If you need a refill on your cardiac medications before your next appointment, please call your pharmacy.

## 2017-04-10 LAB — CUP PACEART INCLINIC DEVICE CHECK
Brady Statistic AP VS Percent: 0 %
Brady Statistic AS VP Percent: 97 %
Brady Statistic AS VS Percent: 0 %
Implantable Lead Implant Date: 20120302
Implantable Lead Implant Date: 20120302
Implantable Lead Location: 753860
Lead Channel Impedance Value: 629 Ohm
Lead Channel Pacing Threshold Amplitude: 0.5 V
Lead Channel Pacing Threshold Amplitude: 0.5 V
Lead Channel Pacing Threshold Pulse Width: 0.4 ms
Lead Channel Pacing Threshold Pulse Width: 0.4 ms
Lead Channel Setting Pacing Amplitude: 2 V
Lead Channel Setting Pacing Amplitude: 2.5 V
Lead Channel Setting Sensing Sensitivity: 2.8 mV
MDC IDC LEAD LOCATION: 753859
MDC IDC MSMT BATTERY IMPEDANCE: 471 Ohm
MDC IDC MSMT BATTERY REMAINING LONGEVITY: 87 mo
MDC IDC MSMT BATTERY VOLTAGE: 2.78 V
MDC IDC MSMT LEADCHNL RA IMPEDANCE VALUE: 501 Ohm
MDC IDC MSMT LEADCHNL RA SENSING INTR AMPL: 4 mV
MDC IDC PG IMPLANT DT: 20120302
MDC IDC SESS DTM: 20180523191244
MDC IDC SET LEADCHNL RV PACING PULSEWIDTH: 0.4 ms
MDC IDC STAT BRADY AP VP PERCENT: 3 %

## 2017-04-16 ENCOUNTER — Other Ambulatory Visit: Payer: Self-pay | Admitting: Internal Medicine

## 2017-07-04 ENCOUNTER — Ambulatory Visit (INDEPENDENT_AMBULATORY_CARE_PROVIDER_SITE_OTHER): Payer: PPO | Admitting: *Deleted

## 2017-07-04 DIAGNOSIS — I442 Atrioventricular block, complete: Secondary | ICD-10-CM

## 2017-07-04 NOTE — Progress Notes (Signed)
Remote pacemaker transmission.   

## 2017-07-13 ENCOUNTER — Encounter: Payer: Self-pay | Admitting: Cardiology

## 2017-07-23 ENCOUNTER — Other Ambulatory Visit: Payer: Self-pay | Admitting: Internal Medicine

## 2017-07-25 ENCOUNTER — Telehealth: Payer: Self-pay | Admitting: Internal Medicine

## 2017-07-25 MED ORDER — TOPIRAMATE 50 MG PO TABS: 50.0000 mg | ORAL_TABLET | Freq: Two times a day (BID) | ORAL | 1 refills | Status: DC

## 2017-07-25 NOTE — Telephone Encounter (Signed)
topiramate (TOPAMAX) 50 MG tablet   [id number] [place], [place] - [address] [phone] (Phone) [phone] (Fax)   Patient is requesting a refill on this medication.

## 2017-07-25 NOTE — Telephone Encounter (Signed)
Reviewed chart pt is up-to-date sent refills to [facility].../lmb  

## 2017-07-26 ENCOUNTER — Ambulatory Visit (INDEPENDENT_AMBULATORY_CARE_PROVIDER_SITE_OTHER): Payer: PPO | Admitting: General Practice

## 2017-07-26 DIAGNOSIS — Z23 Encounter for immunization: Secondary | ICD-10-CM | POA: Diagnosis not present

## 2017-07-26 NOTE — Progress Notes (Signed)
Injection given.   Elvie Maines J Ashaunte Standley, MD  

## 2017-08-13 NOTE — Patient Instructions (Addendum)

## 2017-08-13 NOTE — Progress Notes (Signed)
Subjective:    Patient ID: [name], male    DOB: [birth year], 71 y.o.   MRN: [id number]  HPI The patient is here for follow up.  Hypertension: He is taking his medication daily. He is compliant with a low sodium diet.  He denies chest pain, palpitations, edema, shortness of breath and regular headaches. He is not exercising regularly.  He does not monitor his blood pressure at home.    Hyperlipidemia: He is taking his medication daily. He is compliant with a low fat/cholesterol diet. He is not exercising regularly. He denies myalgias.   Hyperglycemia:  He is compliant with a low sugar/carbohydrate diet.  He is not exercising regularly.  Hx of gout:  He is taking allopurinol daily as prescribed.  He denies any gout since being here.   Chronic headaches:  He is taking topamax daily as prescribed.  His headaches are somewhat controlled.  He has headaches three times a week and advil works.   Medications and allergies reviewed with patient and updated if appropriate.  Patient Active Problem List   Diagnosis Date Noted  . Erectile dysfunction 03/03/2017  . Chronic headaches 02/13/2017  . Snoring 02/15/2015  . Elevated liver enzymes 09/13/2014  . Dupuytren's contracture of both hands 09/02/2013  . History of gout 12/27/2011  . Essential hypertension 01/12/2011  . Complete heart block ([place]) 01/12/2011  . FATIGUE 01/12/2011  . Hyperglycemia 03/02/2009  . HYPERPLASIA PROSTATE UNS W/O UR OBST & OTH LUTS 08/13/2008  . NONSPECIFIC ABNORMAL ELECTROCARDIOGRAM 06/04/2008  . Hyperlipidemia 02/17/2008  . PAROXYSMAL SUPRAVENTRICULAR TACHYCARDIA 02/17/2008  . MITRAL VALVE PROLAPSE 04/17/2007    Current Outpatient Prescriptions on File Prior to Visit  Medication Sig Dispense Refill  . acetaminophen (TYLENOL) 160 MG/5ML liquid Take by mouth every 6 (six) hours as needed for pain (Take as directed per bottle).    Marland Kitchen allopurinol (ZYLOPRIM) 300 MG tablet TAKE 0.5 TABLETS (150 MG TOTAL) BY  MOUTH DAILY. 45 tablet 2  . clobetasol (TEMOVATE) 0.05 % cream Apply 1 application topically daily as needed (itching).     . fish oil-omega-3 fatty acids 1000 MG capsule Take 1 g by mouth 2 (two) times daily.      Marland Kitchen ibuprofen (ADVIL,MOTRIN) 200 MG tablet Take 200 mg by mouth every 6 (six) hours as needed (pain).    Marland Kitchen losartan (COZAAR) 100 MG tablet TAKE 1 TABLET (100 MG TOTAL) BY MOUTH DAILY. 90 tablet 2  . sildenafil (REVATIO) 20 MG tablet Take 3-5 pills as needed 50 tablet 5  . simvastatin (ZOCOR) 40 MG tablet TAKE 1 TABLET (40 MG TOTAL) BY MOUTH AT BEDTIME. 90 tablet 2  . tadalafil (CIALIS) 20 MG tablet TAKE 1 TABLET EVERY 3 DAYS AS NEEDED ONLY 8 tablet 3  . topiramate (TOPAMAX) 50 MG tablet Take 1 tablet (50 mg total) by mouth 2 (two) times daily. 180 tablet 1   No current facility-administered medications on file prior to visit.     Past Medical History:  Diagnosis Date  . Complete heart block (HCC)    S/P  PPM by Dr Rayann Heman  . Coronary artery disease    Non-obstructive. Pt had left heart catheterization in August 2009 showing a 30% mid-circumflex lesion, otherwise coronaries were clean angiographically  . Hyperlipidemia   . Hypertension   . OSA (obstructive sleep apnea)    Probable; study never scheduled    Past Surgical History:  Procedure Laterality Date  . [place], 2009   2009-  negative  . COLONOSCOPY  2011   negative, [facility]  . PACEMAKER INSERTION  01/13/11   MDT by Greggory Brandy for complete heart block    Social History   Social History  . Marital status: Married    Spouse name: N/A  . Number of children: N/A  . Years of education: N/A   Social History Main Topics  . Smoking status: Former Smoker    Quit date: 11/13/2005  . Smokeless tobacco: Never Used     Comment: smoked 1965- 2007, up to 2 ppd  . Alcohol use Yes     Comment:  21 drinks/ week  . Drug use: No  . Sexual activity: Not Asked   Other Topics Concern  . None   Social History  Narrative   Exercise: regularly - golf and walks - does both a few times a week    Family History  Problem Relation Age of Onset  . Heart attack Maternal Grandfather 56  . Diabetes Father   . Peripheral vascular disease Mother   . Cancer Mother        ? primary  . Arrhythmia Mother        AFIB  . Gout Brother   . Leukemia Brother   . Other Sister        prediabetic  . Stroke Neg Hx     Review of Systems  Constitutional: Negative for chills and fever.  Respiratory: Negative for cough, shortness of breath and wheezing.   Cardiovascular: Negative for chest pain, palpitations and leg swelling.  Musculoskeletal: Positive for arthralgias and myalgias.  Neurological: Positive for light-headedness (occasional with hot weather) and headaches. Negative for dizziness.       Objective:   Vitals:   08/14/17 0844  BP: 120/68  Pulse: 74  Resp: 16  Temp: 98 F (36.7 C)  SpO2: 97%   Wt Readings from Last 3 Encounters:  08/14/17 240 lb (108.9 kg)  04/04/17 238 lb 3.2 oz (108 kg)  02/13/17 238 lb 1.3 oz (108 kg)   Body mass index is 28.46 kg/m.   Physical Exam    Constitutional: Appears well-developed and well-nourished. No distress.  HENT:  Head: Normocephalic and atraumatic.  Neck: Neck supple. No tracheal deviation present. No thyromegaly present.  No cervical lymphadenopathy Cardiovascular: Normal rate, regular rhythm and normal heart sounds.   No murmur heard. No carotid bruit .  No edema Pulmonary/Chest: Effort normal and breath sounds normal. No respiratory distress. No has no wheezes. No rales.  Skin: Skin is warm and dry. Not diaphoretic.  Psychiatric: Normal mood and affect. Behavior is normal.      Assessment & Plan:    See Problem List for Assessment and Plan of chronic medical problems.

## 2017-08-14 ENCOUNTER — Encounter: Payer: Self-pay | Admitting: Internal Medicine

## 2017-08-14 ENCOUNTER — Other Ambulatory Visit (INDEPENDENT_AMBULATORY_CARE_PROVIDER_SITE_OTHER): Payer: PPO

## 2017-08-14 ENCOUNTER — Ambulatory Visit (INDEPENDENT_AMBULATORY_CARE_PROVIDER_SITE_OTHER): Payer: PPO | Admitting: Internal Medicine

## 2017-08-14 VITALS — BP 120/68 | HR 74 | Temp 98.0°F | Resp 16 | Wt 240.0 lb

## 2017-08-14 DIAGNOSIS — R51 Headache: Secondary | ICD-10-CM

## 2017-08-14 DIAGNOSIS — I10 Essential (primary) hypertension: Secondary | ICD-10-CM

## 2017-08-14 DIAGNOSIS — R739 Hyperglycemia, unspecified: Secondary | ICD-10-CM

## 2017-08-14 DIAGNOSIS — Z8739 Personal history of other diseases of the musculoskeletal system and connective tissue: Secondary | ICD-10-CM

## 2017-08-14 DIAGNOSIS — E7800 Pure hypercholesterolemia, unspecified: Secondary | ICD-10-CM

## 2017-08-14 DIAGNOSIS — G8929 Other chronic pain: Secondary | ICD-10-CM

## 2017-08-14 LAB — CUP PACEART REMOTE DEVICE CHECK
Battery Impedance: 521 Ohm
Battery Remaining Longevity: 83 mo
Battery Voltage: 2.78 V
Brady Statistic AP VP Percent: 3 %
Implantable Lead Implant Date: 20120302
Implantable Lead Location: 753859
Implantable Lead Model: 5076
Implantable Pulse Generator Implant Date: 20120302
Lead Channel Impedance Value: 472 Ohm
Lead Channel Impedance Value: 621 Ohm
Lead Channel Setting Pacing Amplitude: 2 V
Lead Channel Setting Pacing Amplitude: 2.5 V
MDC IDC LEAD IMPLANT DT: 20120302
MDC IDC LEAD LOCATION: 753860
MDC IDC MSMT LEADCHNL RA PACING THRESHOLD AMPLITUDE: 0.625 V
MDC IDC MSMT LEADCHNL RA PACING THRESHOLD PULSEWIDTH: 0.4 ms
MDC IDC MSMT LEADCHNL RV PACING THRESHOLD AMPLITUDE: 0.5 V
MDC IDC MSMT LEADCHNL RV PACING THRESHOLD PULSEWIDTH: 0.4 ms
MDC IDC SESS DTM: 20180822132111
MDC IDC SET LEADCHNL RV PACING PULSEWIDTH: 0.4 ms
MDC IDC SET LEADCHNL RV SENSING SENSITIVITY: 2.8 mV
MDC IDC STAT BRADY AP VS PERCENT: 0 %
MDC IDC STAT BRADY AS VP PERCENT: 97 %
MDC IDC STAT BRADY AS VS PERCENT: 0 %

## 2017-08-14 LAB — CBC WITH DIFFERENTIAL/PLATELET
BASOS PCT: 0.7 % (ref 0.0–3.0)
Basophils Absolute: 0 10*3/uL (ref 0.0–0.1)
EOS ABS: 0.2 10*3/uL (ref 0.0–0.7)
Eosinophils Relative: 3.4 % (ref 0.0–5.0)
HEMATOCRIT: 43.7 % (ref 39.0–52.0)
Hemoglobin: 14.8 g/dL (ref 13.0–17.0)
LYMPHS ABS: 1.9 10*3/uL (ref 0.7–4.0)
Lymphocytes Relative: 38.6 % (ref 12.0–46.0)
MCHC: 33.7 g/dL (ref 30.0–36.0)
MCV: 107 fl — AB (ref 78.0–100.0)
Monocytes Absolute: 0.5 10*3/uL (ref 0.1–1.0)
Monocytes Relative: 9.5 % (ref 3.0–12.0)
NEUTROS ABS: 2.3 10*3/uL (ref 1.4–7.7)
NEUTROS PCT: 47.8 % (ref 43.0–77.0)
PLATELETS: 178 10*3/uL (ref 150.0–400.0)
RBC: 4.09 Mil/uL — ABNORMAL LOW (ref 4.22–5.81)
RDW: 14.2 % (ref 11.5–15.5)
WBC: 4.9 10*3/uL (ref 4.0–10.5)

## 2017-08-14 LAB — COMPREHENSIVE METABOLIC PANEL
ALT: 52 U/L (ref 0–53)
AST: 51 U/L — ABNORMAL HIGH (ref 0–37)
Albumin: 4.4 g/dL (ref 3.5–5.2)
Alkaline Phosphatase: 43 U/L (ref 39–117)
BUN: 19 mg/dL (ref 6–23)
CALCIUM: 10.1 mg/dL (ref 8.4–10.5)
CHLORIDE: 105 meq/L (ref 96–112)
CO2: 25 meq/L (ref 19–32)
Creatinine, Ser: 1.2 mg/dL (ref 0.40–1.50)
GFR: 63.69 mL/min (ref >60.00)
GLUCOSE: 109 mg/dL — AB (ref 70–99)
POTASSIUM: 4.3 meq/L (ref 3.5–5.1)
Sodium: 138 mEq/L (ref 135–145)
Total Bilirubin: 0.6 mg/dL (ref 0.2–1.2)
Total Protein: 6.8 g/dL (ref 6.0–8.3)

## 2017-08-14 LAB — LIPID PANEL
CHOL/HDL RATIO: 4
CHOLESTEROL: 189 mg/dL (ref 0–200)
HDL: 50 mg/dL (ref >39.00)
LDL CALC: 114 mg/dL — AB (ref 0–99)
NonHDL: 139.21
TRIGLYCERIDES: 128 mg/dL (ref 0.0–149.0)
VLDL: 25.6 mg/dL (ref 0.0–40.0)

## 2017-08-14 LAB — URIC ACID: Uric Acid, Serum: 5.1 mg/dL (ref 4.0–7.8)

## 2017-08-14 LAB — HEMOGLOBIN A1C: Hgb A1c MFr Bld: 5.5 % (ref 4.6–6.5)

## 2017-08-14 NOTE — Assessment & Plan Note (Signed)
No gout symptoms Will check uric acid level Continue allopurinol

## 2017-08-14 NOTE — Assessment & Plan Note (Signed)
BP well controlled Current regimen effective and well tolerated Continue current medications at current doses cmp  

## 2017-08-14 NOTE — Assessment & Plan Note (Signed)
Check lipid panel  Continue daily statin Regular exercise and healthy diet encouraged  

## 2017-08-14 NOTE — Assessment & Plan Note (Signed)
Not ideally controlled Taking topamax BID - still has headaches 3/week - taking advil which works Does not want to see neuro Does not want to increase topamax

## 2017-08-14 NOTE — Assessment & Plan Note (Signed)
Check a1c 

## 2017-08-16 ENCOUNTER — Encounter: Payer: Self-pay | Admitting: Internal Medicine

## 2017-08-22 ENCOUNTER — Other Ambulatory Visit: Payer: Self-pay | Admitting: Emergency Medicine

## 2017-08-22 MED ORDER — ALLOPURINOL 300 MG PO TABS: 150.0000 mg | ORAL_TABLET | Freq: Every day | ORAL | 3 refills | Status: DC

## 2017-08-22 MED ORDER — LOSARTAN POTASSIUM 100 MG PO TABS: 100.0000 mg | ORAL_TABLET | Freq: Every day | ORAL | 3 refills | Status: DC

## 2017-08-22 MED ORDER — SIMVASTATIN 40 MG PO TABS: ORAL_TABLET | ORAL | 3 refills | Status: DC

## 2017-10-03 ENCOUNTER — Ambulatory Visit (INDEPENDENT_AMBULATORY_CARE_PROVIDER_SITE_OTHER): Payer: PPO | Admitting: *Deleted

## 2017-10-03 DIAGNOSIS — I442 Atrioventricular block, complete: Secondary | ICD-10-CM

## 2017-10-03 NOTE — Progress Notes (Signed)
Remote pacemaker transmission.   

## 2017-10-12 ENCOUNTER — Encounter: Payer: Self-pay | Admitting: Cardiology

## 2017-10-22 LAB — CUP PACEART REMOTE DEVICE CHECK
Brady Statistic AP VP Percent: 3 %
Brady Statistic AP VS Percent: 0 %
Brady Statistic AS VP Percent: 97 %
Brady Statistic AS VS Percent: 0 %
Date Time Interrogation Session: 20181121132209
Implantable Lead Implant Date: 20120302
Implantable Lead Location: 753859
Implantable Lead Location: 753860
Implantable Pulse Generator Implant Date: 20120302
Lead Channel Impedance Value: 472 Ohm
Lead Channel Impedance Value: 665 Ohm
Lead Channel Pacing Threshold Amplitude: 0.5 V
Lead Channel Pacing Threshold Amplitude: 0.625 V
Lead Channel Pacing Threshold Pulse Width: 0.4 ms
Lead Channel Pacing Threshold Pulse Width: 0.4 ms
MDC IDC LEAD IMPLANT DT: 20120302
MDC IDC MSMT BATTERY IMPEDANCE: 570 Ohm
MDC IDC MSMT BATTERY REMAINING LONGEVITY: 81 mo
MDC IDC MSMT BATTERY VOLTAGE: 2.78 V
MDC IDC SET LEADCHNL RA PACING AMPLITUDE: 2 V
MDC IDC SET LEADCHNL RV PACING AMPLITUDE: 2.5 V
MDC IDC SET LEADCHNL RV PACING PULSEWIDTH: 0.4 ms
MDC IDC SET LEADCHNL RV SENSING SENSITIVITY: 2.8 mV

## 2017-11-19 ENCOUNTER — Encounter: Payer: Self-pay | Admitting: Internal Medicine

## 2017-11-19 MED ORDER — SIMVASTATIN 40 MG PO TABS: ORAL_TABLET | ORAL | 1 refills | Status: DC

## 2017-11-19 MED ORDER — LOSARTAN POTASSIUM 100 MG PO TABS: 100.0000 mg | ORAL_TABLET | Freq: Every day | ORAL | 1 refills | Status: DC

## 2017-11-19 MED ORDER — ALLOPURINOL 300 MG PO TABS: 150.0000 mg | ORAL_TABLET | Freq: Every day | ORAL | 1 refills | Status: DC

## 2017-11-21 ENCOUNTER — Telehealth: Payer: Self-pay | Admitting: Internal Medicine

## 2017-11-21 ENCOUNTER — Telehealth: Payer: Self-pay | Admitting: Emergency Medicine

## 2017-11-21 NOTE — Telephone Encounter (Signed)
Spoke with [name] regarding AWV. Pt declined to schedule at this time.

## 2017-11-21 NOTE — Telephone Encounter (Signed)
Copied from Rachel. Topic: General - Call Back - No Documentation >> Nov 20, 2017  4:12 PM Ether Griffins B wrote: Reason for CRM: pt states he received a call today. Nothing in his notes from today.   >> Nov 21, 2017  8:23 AM [place], Isidoro Donning wrote: Did you call this pt?

## 2017-11-21 NOTE — Telephone Encounter (Signed)
Have not attempted to call pt, I do not see anything in chart where someone tried to contact him.

## 2017-12-09 ENCOUNTER — Encounter: Payer: Self-pay | Admitting: Internal Medicine

## 2017-12-10 MED ORDER — ZOSTER VAC RECOMB ADJUVANTED 50 MCG/0.5ML IM SUSR: 0.5000 mL | Freq: Once | INTRAMUSCULAR | 1 refills | Status: AC

## 2017-12-11 MED FILL — SHINGRIX VIAL KIT: 50 | 1 days supply | Qty: 1 | Fill #0

## 2018-01-02 ENCOUNTER — Ambulatory Visit (INDEPENDENT_AMBULATORY_CARE_PROVIDER_SITE_OTHER): Payer: PPO | Admitting: *Deleted

## 2018-01-02 DIAGNOSIS — I442 Atrioventricular block, complete: Secondary | ICD-10-CM | POA: Diagnosis not present

## 2018-01-02 NOTE — Progress Notes (Signed)
Remote pacemaker transmission.   

## 2018-01-03 ENCOUNTER — Encounter: Payer: Self-pay | Admitting: Cardiology

## 2018-01-04 ENCOUNTER — Encounter: Payer: Self-pay | Admitting: Internal Medicine

## 2018-01-04 LAB — CUP PACEART REMOTE DEVICE CHECK
Battery Impedance: 595 Ohm
Battery Remaining Longevity: 79 mo
Battery Voltage: 2.78 V
Brady Statistic AP VP Percent: 4 %
Brady Statistic AP VS Percent: 0 %
Brady Statistic AS VP Percent: 96 %
Brady Statistic AS VS Percent: 0 %
Date Time Interrogation Session: 20190220140620
Implantable Lead Implant Date: 20120302
Implantable Lead Implant Date: 20120302
Implantable Lead Location: 753859
Implantable Lead Location: 753860
Implantable Lead Model: 5076
Implantable Pulse Generator Implant Date: 20120302
Lead Channel Impedance Value: 473 Ohm
Lead Channel Impedance Value: 651 Ohm
Lead Channel Pacing Threshold Amplitude: 0.5 V
Lead Channel Pacing Threshold Amplitude: 0.625 V
Lead Channel Pacing Threshold Pulse Width: 0.4 ms
Lead Channel Pacing Threshold Pulse Width: 0.4 ms
Lead Channel Setting Pacing Amplitude: 2 V
Lead Channel Setting Pacing Amplitude: 2.5 V
Lead Channel Setting Pacing Pulse Width: 0.4 ms
Lead Channel Setting Sensing Sensitivity: 2.8 mV

## 2018-01-26 ENCOUNTER — Other Ambulatory Visit: Payer: Self-pay | Admitting: Internal Medicine

## 2018-02-11 MED FILL — SHINGRIX VIAL KIT: 50 | 1 days supply | Qty: 1 | Fill #1

## 2018-02-12 NOTE — Progress Notes (Signed)
Subjective:    Patient ID: [name], male    DOB: September 20, 1946, 72 y.o.   MRN: [id number]  HPI The patient is here for follow up.  Hypertension: He is taking his medication daily. He is compliant with a low sodium diet.  He denies chest pain, palpitations, edema, shortness of breath and lightheadedness. He is not exercising regularly.  He does not monitor his blood pressure at home.    Hyperlipidemia: He is taking his medication daily. He is compliant with a low fat/cholesterol diet. He is not exercising regularly. He denies myalgias.   Chronic headaches:  He is taking topamax daily as prescribed.  His headaches are better controlled with the Topamax, but he still experiences almost daily headaches.  He is deferred neurology evaluation in the past.  CT scan of the head was within normal limits.  He takes an Advil or Tylenol and he goes away.  He typically only takes 1 a day and tries not to take anymore.  hyperglycemia:  He is compliant with a low sugar/carbohydrate diet.  He is not exercising regularly.    Gout:  He is taking allopurinol daily.  He drinks tart cherry juice.  He denies any gout since his last visit.    Medications and allergies reviewed with patient and updated if appropriate.  Patient Active Problem List   Diagnosis Date Noted  . Erectile dysfunction 03/03/2017  . Chronic headaches 02/13/2017  . Snoring 02/15/2015  . Elevated liver enzymes 09/13/2014  . Dupuytren's contracture of both hands 09/02/2013  . History of gout 12/27/2011  . Essential hypertension 01/12/2011  . Complete heart block ([place]) 01/12/2011  . FATIGUE 01/12/2011  . Hyperglycemia 03/02/2009  . HYPERPLASIA PROSTATE UNS W/O UR OBST & OTH LUTS 08/13/2008  . NONSPECIFIC ABNORMAL ELECTROCARDIOGRAM 06/04/2008  . Hyperlipidemia 02/17/2008  . PAROXYSMAL SUPRAVENTRICULAR TACHYCARDIA 02/17/2008  . MITRAL VALVE PROLAPSE 04/17/2007    Current Outpatient Medications on File Prior to Visit    Medication Sig Dispense Refill  . allopurinol (ZYLOPRIM) 300 MG tablet Take 0.5 tablets (150 mg total) by mouth daily. 45 tablet 1  . clobetasol (TEMOVATE) 0.05 % cream Apply 1 application topically daily as needed (itching).     . fish oil-omega-3 fatty acids 1000 MG capsule Take 1 g by mouth 2 (two) times daily.      Marland Kitchen ibuprofen (ADVIL,MOTRIN) 200 MG tablet Take 200 mg by mouth daily.    Marland Kitchen losartan (COZAAR) 100 MG tablet Take 1 tablet (100 mg total) by mouth daily. 90 tablet 1  . sildenafil (REVATIO) 20 MG tablet Take 3-5 pills as needed 50 tablet 5  . simvastatin (ZOCOR) 40 MG tablet TAKE 1 TABLET (40 MG TOTAL) BY MOUTH AT BEDTIME. 90 tablet 1  . tadalafil (CIALIS) 20 MG tablet TAKE 1 TABLET EVERY 3 DAYS AS NEEDED ONLY 8 tablet 3  . topiramate (TOPAMAX) 50 MG tablet TAKE 1 TABLET BY MOUTH TWICE A DAY 180 tablet 1   No current facility-administered medications on file prior to visit.     Past Medical History:  Diagnosis Date  . Complete heart block (HCC)    S/P  PPM by Dr Rayann Heman  . Coronary artery disease    Non-obstructive. Pt had left heart catheterization in August 2009 showing a 30% mid-circumflex lesion, otherwise coronaries were clean angiographically  . Hyperlipidemia   . Hypertension   . OSA (obstructive sleep apnea)    Probable; study never scheduled    Past Surgical History:  Procedure Laterality Date  . [place], 2009   2009- negative  . COLONOSCOPY  2011   negative, [facility]  . PACEMAKER INSERTION  01/13/11   MDT by Greggory Brandy for complete heart block    Social History   Socioeconomic History  . Marital status: Married    Spouse name: Not on file  . Number of children: Not on file  . Years of education: Not on file  . Highest education level: Not on file  Occupational History  . Not on file  Social Needs  . Financial resource strain: Not on file  . Food insecurity:    Worry: Not on file    Inability: Not on file  . Transportation needs:     Medical: Not on file    Non-medical: Not on file  Tobacco Use  . Smoking status: Former Smoker    Last attempt to quit: 11/13/2005    Years since quitting: 12.2  . Smokeless tobacco: Never Used  . Tobacco comment: smoked 1965- 2007, up to 2 ppd  Substance and Sexual Activity  . Alcohol use: Yes    Comment:  21 drinks/ week  . Drug use: No  . Sexual activity: Not on file  Lifestyle  . Physical activity:    Days per week: Not on file    Minutes per session: Not on file  . Stress: Not on file  Relationships  . Social connections:    Talks on phone: Not on file    Gets together: Not on file    Attends religious service: Not on file    Active member of club or organization: Not on file    Attends meetings of clubs or organizations: Not on file    Relationship status: Not on file  Other Topics Concern  . Not on file  Social History Narrative   Exercise: regularly - golf and walks - does both a few times a week    Family History  Problem Relation Age of Onset  . Heart attack Maternal Grandfather 56  . Diabetes Father   . Peripheral vascular disease Mother   . Cancer Mother        ? primary  . Arrhythmia Mother        AFIB  . Gout Brother   . Leukemia Brother   . Other Sister        prediabetic  . Stroke Neg Hx     Review of Systems  Constitutional: Negative for chills and fever.  Respiratory: Negative for cough, shortness of breath and wheezing.   Cardiovascular: Negative for chest pain, palpitations and leg swelling.  Musculoskeletal: Positive for arthralgias (OA).  Neurological: Positive for headaches. Negative for dizziness and light-headedness.       Objective:   Vitals:   02/13/18 0849  BP: 132/74  Pulse: 83  Resp: 16  Temp: 98.3 F (36.8 C)  SpO2: 96%   BP Readings from Last 3 Encounters:  02/13/18 132/74  08/14/17 120/68  04/04/17 134/72   Wt Readings from Last 3 Encounters:  02/13/18 246 lb (111.6 kg)  08/14/17 240 lb (108.9 kg)  04/04/17  238 lb 3.2 oz (108 kg)   Body mass index is 29.17 kg/m.   Physical Exam    Constitutional: Appears well-developed and well-nourished. No distress.  HENT:  Head: Normocephalic and atraumatic.  Neck: Neck supple. No tracheal deviation present. No thyromegaly present.  No cervical lymphadenopathy Cardiovascular: Normal rate, regular rhythm and normal heart sounds.  No murmur heard. No carotid bruit .  No edema Pulmonary/Chest: Effort normal and breath sounds normal. No respiratory distress. No has no wheezes. No rales.  Skin: Skin is warm and dry. Not diaphoretic.  Psychiatric: Normal mood and affect. Behavior is normal.      Assessment & Plan:    See Problem List for Assessment and Plan of chronic medical problems.

## 2018-02-13 ENCOUNTER — Ambulatory Visit (INDEPENDENT_AMBULATORY_CARE_PROVIDER_SITE_OTHER): Payer: PPO | Admitting: Internal Medicine

## 2018-02-13 ENCOUNTER — Encounter: Payer: Self-pay | Admitting: Internal Medicine

## 2018-02-13 ENCOUNTER — Other Ambulatory Visit (INDEPENDENT_AMBULATORY_CARE_PROVIDER_SITE_OTHER): Payer: PPO

## 2018-02-13 VITALS — BP 132/74 | HR 83 | Temp 98.3°F | Resp 16 | Wt 246.0 lb

## 2018-02-13 DIAGNOSIS — E7849 Other hyperlipidemia: Secondary | ICD-10-CM

## 2018-02-13 DIAGNOSIS — R739 Hyperglycemia, unspecified: Secondary | ICD-10-CM | POA: Diagnosis not present

## 2018-02-13 DIAGNOSIS — Z8739 Personal history of other diseases of the musculoskeletal system and connective tissue: Secondary | ICD-10-CM

## 2018-02-13 DIAGNOSIS — I10 Essential (primary) hypertension: Secondary | ICD-10-CM

## 2018-02-13 DIAGNOSIS — R519 Headache, unspecified: Secondary | ICD-10-CM

## 2018-02-13 DIAGNOSIS — R51 Headache: Secondary | ICD-10-CM | POA: Diagnosis not present

## 2018-02-13 LAB — COMPREHENSIVE METABOLIC PANEL
ALBUMIN: 4.5 g/dL (ref 3.5–5.2)
ALT: 66 U/L — ABNORMAL HIGH (ref 0–53)
AST: 57 U/L — AB (ref 0–37)
Alkaline Phosphatase: 49 U/L (ref 39–117)
BUN: 19 mg/dL (ref 6–23)
CHLORIDE: 103 meq/L (ref 96–112)
CO2: 24 meq/L (ref 19–32)
CREATININE: 1.05 mg/dL (ref 0.40–1.50)
Calcium: 10.2 mg/dL (ref 8.4–10.5)
GFR: 74.19 mL/min (ref >60.00)
GLUCOSE: 110 mg/dL — AB (ref 70–99)
POTASSIUM: 4.6 meq/L (ref 3.5–5.1)
SODIUM: 138 meq/L (ref 135–145)
Total Bilirubin: 0.9 mg/dL (ref 0.2–1.2)
Total Protein: 7.4 g/dL (ref 6.0–8.3)

## 2018-02-13 LAB — HEMOGLOBIN A1C: HEMOGLOBIN A1C: 5.5 % (ref 4.6–6.5)

## 2018-02-13 LAB — LIPID PANEL
CHOL/HDL RATIO: 4
CHOLESTEROL: 185 mg/dL (ref 0–200)
HDL: 52.8 mg/dL (ref >39.00)
LDL CALC: 104 mg/dL — AB (ref 0–99)
NONHDL: 132.43
Triglycerides: 142 mg/dL (ref 0.0–149.0)
VLDL: 28.4 mg/dL (ref 0.0–40.0)

## 2018-02-13 LAB — URIC ACID: Uric Acid, Serum: 4.7 mg/dL (ref 4.0–7.8)

## 2018-02-13 NOTE — Assessment & Plan Note (Signed)
Fairly controlled, but still experiencing mild headaches on an almost daily basis He deferred increasing Topamax or referral to neurology Continue current dose of Topamax Keep Advil and Tylenol to a minimum

## 2018-02-13 NOTE — Patient Instructions (Addendum)
  Test(s) ordered today. Your results will be released to MyChart (or called to you) after review, usually within 72hours after test completion. If any changes need to be made, you will be notified at that same time.  Medications reviewed and updated.  No changes recommended at this time.    Please followup in 6 months   

## 2018-02-13 NOTE — Assessment & Plan Note (Signed)
BP well controlled Current regimen effective and well tolerated Continue current medications at current doses cmp  

## 2018-02-13 NOTE — Assessment & Plan Note (Signed)
Gout well controlled-no recurrences Continue allopurinol 150 mg daily Also drinking tart cherry juice Will check uric acid level, CMP

## 2018-02-13 NOTE — Assessment & Plan Note (Signed)
Check lipid panel  Continue daily statin Regular exercise and healthy diet encouraged  

## 2018-02-14 ENCOUNTER — Encounter: Payer: Self-pay | Admitting: Internal Medicine

## 2018-04-03 ENCOUNTER — Ambulatory Visit (INDEPENDENT_AMBULATORY_CARE_PROVIDER_SITE_OTHER): Payer: PPO | Admitting: *Deleted

## 2018-04-03 DIAGNOSIS — I442 Atrioventricular block, complete: Secondary | ICD-10-CM

## 2018-04-03 NOTE — Progress Notes (Signed)
Remote pacemaker transmission.   

## 2018-04-17 ENCOUNTER — Encounter: Payer: Self-pay | Admitting: Internal Medicine

## 2018-04-17 ENCOUNTER — Ambulatory Visit: Payer: PPO | Admitting: Internal Medicine

## 2018-04-17 VITALS — BP 130/80 | HR 86 | Ht 77.0 in | Wt 244.0 lb

## 2018-04-17 DIAGNOSIS — Z950 Presence of cardiac pacemaker: Secondary | ICD-10-CM | POA: Diagnosis not present

## 2018-04-17 DIAGNOSIS — I442 Atrioventricular block, complete: Secondary | ICD-10-CM

## 2018-04-17 DIAGNOSIS — I10 Essential (primary) hypertension: Secondary | ICD-10-CM

## 2018-04-17 DIAGNOSIS — I471 Supraventricular tachycardia: Secondary | ICD-10-CM | POA: Diagnosis not present

## 2018-04-17 LAB — CUP PACEART INCLINIC DEVICE CHECK
Battery Impedance: 645 Ohm
Battery Voltage: 2.78 V
Brady Statistic AP VP Percent: 4 %
Brady Statistic AS VS Percent: 0 %
Date Time Interrogation Session: 20190605140020
Implantable Lead Implant Date: 20120302
Implantable Lead Implant Date: 20120302
Implantable Lead Location: 753859
Implantable Lead Model: 5076
Lead Channel Impedance Value: 487 Ohm
Lead Channel Impedance Value: 632 Ohm
Lead Channel Pacing Threshold Pulse Width: 0.4 ms
Lead Channel Sensing Intrinsic Amplitude: 4 mV
Lead Channel Setting Pacing Amplitude: 2 V
Lead Channel Setting Pacing Amplitude: 2.5 V
Lead Channel Setting Sensing Sensitivity: 2.8 mV
MDC IDC LEAD LOCATION: 753860
MDC IDC MSMT BATTERY REMAINING LONGEVITY: 76 mo
MDC IDC MSMT LEADCHNL RA PACING THRESHOLD AMPLITUDE: 0.75 V
MDC IDC MSMT LEADCHNL RA PACING THRESHOLD PULSEWIDTH: 0.4 ms
MDC IDC MSMT LEADCHNL RV PACING THRESHOLD AMPLITUDE: 0.5 V
MDC IDC PG IMPLANT DT: 20120302
MDC IDC SET LEADCHNL RV PACING PULSEWIDTH: 0.4 ms
MDC IDC STAT BRADY AP VS PERCENT: 0 %
MDC IDC STAT BRADY AS VP PERCENT: 96 %

## 2018-04-17 NOTE — Progress Notes (Signed)
    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  Since last being seen in our clinic, the patient reports doing very well.  Today, he denies symptoms of palpitations, chest pain, shortness of breath,  lower extremity edema, dizziness, presyncope, or syncope.  The patient is otherwise without complaint today.   Past Medical History:  Diagnosis Date  . Complete heart block (HCC)    S/P  PPM by Dr Rayann Heman  . Coronary artery disease    Non-obstructive. Pt had left heart catheterization in August 2009 showing a 30% mid-circumflex lesion, otherwise coronaries were clean angiographically  . Hyperlipidemia   . Hypertension   . OSA (obstructive sleep apnea)    Probable; study never scheduled   Past Surgical History:  Procedure Laterality Date  . [place], 2009   2009- negative  . COLONOSCOPY  2011   negative, [facility]  . PACEMAKER INSERTION  01/13/11   MDT by Greggory Brandy for complete heart block    ROS- all systems are reviewed and negative except as per HPI above  Current Outpatient Medications  Medication Sig Dispense Refill  . allopurinol (ZYLOPRIM) 300 MG tablet Take 0.5 tablets (150 mg total) by mouth daily. 45 tablet 1  . clobetasol (TEMOVATE) 0.05 % cream Apply 1 application topically daily as needed (itching).     . fish oil-omega-3 fatty acids 1000 MG capsule Take 1 g by mouth 2 (two) times daily.      Marland Kitchen ibuprofen (ADVIL,MOTRIN) 200 MG tablet Take 200 mg by mouth daily.    Marland Kitchen losartan (COZAAR) 100 MG tablet Take 1 tablet (100 mg total) by mouth daily. 90 tablet 1  . sildenafil (REVATIO) 20 MG tablet Take 3-5 pills as needed 50 tablet 5  . simvastatin (ZOCOR) 40 MG tablet TAKE 1 TABLET (40 MG TOTAL) BY MOUTH AT BEDTIME. 90 tablet 1  . tadalafil (CIALIS) 20 MG tablet TAKE 1 TABLET EVERY 3 DAYS AS NEEDED ONLY 8 tablet 3  . topiramate (TOPAMAX) 50 MG tablet TAKE 1 TABLET  BY MOUTH TWICE A DAY 180 tablet 1   No current facility-administered medications for this visit.     Physical Exam: Vitals:   04/17/18 1205  BP: 130/80  Pulse: 86  Weight: 244 lb (110.7 kg)  Height: 6\' 5"  (1.956 m)    GEN- The patient is well appearing, alert and oriented x 3 today.   Head- normocephalic, atraumatic Eyes-  Sclera clear, conjunctiva pink Ears- hearing intact Oropharynx- clear Lungs- Clear to ausculation bilaterally, normal work of breathing Chest- pacemaker pocket is well healed Heart- Regular rate and rhythm, no murmurs, rubs or gallops, PMI not laterally displaced GI- soft, NT, ND, + BS Extremities- no clubbing, cyanosis, or edema  Pacemaker interrogation- reviewed in detail today,  See PACEART report  ekg tracing ordered today is personally reviewed and shows AV paced rhythm  Assessment and Plan:  1. Symptomatic complete heart block Normal pacemaker function See Pace Art report No changes today  2. HTN Stable No change required today  3. Overweight Body mass index is 28.93 kg/m.  Wt Readings from Last 3 Encounters:  04/17/18 244 lb (110.7 kg)  02/13/18 246 lb (111.6 kg)  08/14/17 240 lb (108.9 kg)   [facility] Return to see EP NP in a year  Thompson Grayer MD, [facility] 04/17/2018 12:30 PM

## 2018-04-17 NOTE — Patient Instructions (Addendum)
Medication Instructions:  Your physician recommends that you continue on your current medications as directed. Please refer to the Current Medication list given to you today.  Labwork: None ordered.  Testing/Procedures: None ordered.  Follow-Up: Your physician wants you to follow-up in: one year with Chanetta Marshall, NP.   You will receive a reminder letter in the mail two months in advance. If you don't receive a letter, please call our office to schedule the follow-up appointment.  Remote monitoring is used to monitor your Pacemaker from home. This monitoring reduces the number of office visits required to check your device to one time per year. It allows [place] to keep an eye on the functioning of your device to ensure it is working properly. You are scheduled for a device check from home on 07/03/2018. You may send your transmission at any time that day. If you have a wireless device, the transmission will be sent automatically. After your physician reviews your transmission, you will receive a postcard with your next transmission date.  Any Other Special Instructions Will Be Listed Below (If Applicable).  If you need a refill on your cardiac medications before your next appointment, please call your pharmacy.

## 2018-04-30 LAB — CUP PACEART REMOTE DEVICE CHECK
Battery Impedance: 670 Ohm
Battery Remaining Longevity: 75 mo
Battery Voltage: 2.78 V
Brady Statistic AS VS Percent: 0 %
Implantable Lead Implant Date: 20120302
Implantable Lead Location: 753859
Implantable Lead Location: 753860
Implantable Lead Model: 5076
Lead Channel Impedance Value: 663 Ohm
Lead Channel Pacing Threshold Pulse Width: 0.4 ms
Lead Channel Setting Pacing Amplitude: 2 V
Lead Channel Setting Pacing Amplitude: 2.5 V
Lead Channel Setting Pacing Pulse Width: 0.4 ms
Lead Channel Setting Sensing Sensitivity: 2.8 mV
MDC IDC LEAD IMPLANT DT: 20120302
MDC IDC MSMT LEADCHNL RA IMPEDANCE VALUE: 486 Ohm
MDC IDC MSMT LEADCHNL RA PACING THRESHOLD AMPLITUDE: 0.625 V
MDC IDC MSMT LEADCHNL RA PACING THRESHOLD PULSEWIDTH: 0.4 ms
MDC IDC MSMT LEADCHNL RV PACING THRESHOLD AMPLITUDE: 0.5 V
MDC IDC PG IMPLANT DT: 20120302
MDC IDC SESS DTM: 20190522133022
MDC IDC STAT BRADY AP VP PERCENT: 4 %
MDC IDC STAT BRADY AP VS PERCENT: 0 %
MDC IDC STAT BRADY AS VP PERCENT: 96 %

## 2018-07-03 ENCOUNTER — Ambulatory Visit (INDEPENDENT_AMBULATORY_CARE_PROVIDER_SITE_OTHER): Payer: PPO | Admitting: *Deleted

## 2018-07-03 DIAGNOSIS — I442 Atrioventricular block, complete: Secondary | ICD-10-CM | POA: Diagnosis not present

## 2018-07-03 DIAGNOSIS — I10 Essential (primary) hypertension: Secondary | ICD-10-CM

## 2018-07-03 NOTE — Progress Notes (Signed)
Remote pacemaker transmission.   

## 2018-07-05 ENCOUNTER — Encounter: Payer: Self-pay | Admitting: Cardiology

## 2018-07-07 ENCOUNTER — Other Ambulatory Visit: Payer: Self-pay | Admitting: Internal Medicine

## 2018-08-05 ENCOUNTER — Encounter: Payer: Self-pay | Admitting: Internal Medicine

## 2018-08-08 LAB — CUP PACEART REMOTE DEVICE CHECK
Battery Impedance: 721 Ohm
Battery Voltage: 2.78 V
Brady Statistic AP VP Percent: 4 %
Brady Statistic AP VS Percent: 0 %
Brady Statistic AS VP Percent: 96 %
Implantable Lead Implant Date: 20120302
Implantable Lead Location: 753860
Implantable Lead Model: 5076
Implantable Pulse Generator Implant Date: 20120302
Lead Channel Impedance Value: 466 Ohm
Lead Channel Pacing Threshold Amplitude: 0.625 V
Lead Channel Pacing Threshold Pulse Width: 0.4 ms
Lead Channel Pacing Threshold Pulse Width: 0.4 ms
Lead Channel Sensing Intrinsic Amplitude: 2.8 mV
Lead Channel Setting Pacing Amplitude: 2.5 V
Lead Channel Setting Sensing Sensitivity: 2.8 mV
MDC IDC LEAD IMPLANT DT: 20120302
MDC IDC LEAD LOCATION: 753859
MDC IDC MSMT BATTERY REMAINING LONGEVITY: 72 mo
MDC IDC MSMT LEADCHNL RV IMPEDANCE VALUE: 678 Ohm
MDC IDC MSMT LEADCHNL RV PACING THRESHOLD AMPLITUDE: 0.5 V
MDC IDC SESS DTM: 20190821114347
MDC IDC SET LEADCHNL RA PACING AMPLITUDE: 2 V
MDC IDC SET LEADCHNL RV PACING PULSEWIDTH: 0.4 ms
MDC IDC STAT BRADY AS VS PERCENT: 0 %

## 2018-08-14 NOTE — Progress Notes (Signed)
Subjective:    Patient ID: [name], male    DOB: Dec 14, 1945, 72 y.o.   MRN: [id number]  HPI The patient is here for follow up.  Hypertension: He is taking his medication daily. He is compliant with a low sodium diet.  He denies chest pain, palpitations, edema, shortness of breath and regular headaches. He is not exercising regularly.  He does not monitor his blood pressure at home.    Hyperlipidemia: He is taking his medication daily. He is compliant with a low fat/cholesterol diet. He is exercising regularly. He denies myalgias.   hyperglycemia:  He is compliant with a low sugar/carbohydrate diet.  He is exercising regularly.  Gout: he is taking allopurinol daily.  He denies gout flares.   Chronic headaches:  He is taking topamax.  He takes advil daily.  His headaches are controlled.     Medications and allergies reviewed with patient and updated if appropriate.  Patient Active Problem List   Diagnosis Date Noted  . Erectile dysfunction 03/03/2017  . Chronic headaches 02/13/2017  . Snoring 02/15/2015  . Elevated liver enzymes 09/13/2014  . Dupuytren's contracture of both hands 09/02/2013  . History of gout 12/27/2011  . Essential hypertension 01/12/2011  . Complete heart block ([place]) 01/12/2011  . FATIGUE 01/12/2011  . Hyperglycemia 03/02/2009  . HYPERPLASIA PROSTATE UNS W/O UR OBST & OTH LUTS 08/13/2008  . NONSPECIFIC ABNORMAL ELECTROCARDIOGRAM 06/04/2008  . Hyperlipidemia 02/17/2008  . PAROXYSMAL SUPRAVENTRICULAR TACHYCARDIA 02/17/2008  . MITRAL VALVE PROLAPSE 04/17/2007    Current Outpatient Medications on File Prior to Visit  Medication Sig Dispense Refill  . allopurinol (ZYLOPRIM) 300 MG tablet Take 0.5 tablets (150 mg total) by mouth daily. 45 tablet 1  . clobetasol (TEMOVATE) 0.05 % cream Apply 1 application topically daily as needed (itching).     . fish oil-omega-3 fatty acids 1000 MG capsule Take 1 g by mouth 2 (two) times daily.      Marland Kitchen ibuprofen  (ADVIL,MOTRIN) 200 MG tablet Take 200 mg by mouth daily.    Marland Kitchen losartan (COZAAR) 100 MG tablet Take 1 tablet (100 mg total) by mouth daily. 90 tablet 1  . sildenafil (REVATIO) 20 MG tablet Take 3-5 pills as needed 50 tablet 5  . simvastatin (ZOCOR) 40 MG tablet TAKE 1 TABLET (40 MG TOTAL) BY MOUTH AT BEDTIME. 90 tablet 1  . tadalafil (CIALIS) 20 MG tablet TAKE 1 TABLET EVERY 3 DAYS AS NEEDED ONLY 8 tablet 3  . topiramate (TOPAMAX) 50 MG tablet TAKE 1 TABLET BY MOUTH TWICE A DAY 180 tablet 0   No current facility-administered medications on file prior to visit.     Past Medical History:  Diagnosis Date  . Complete heart block (HCC)    S/P  PPM by Dr Rayann Heman  . Coronary artery disease    Non-obstructive. Pt had left heart catheterization in August 2009 showing a 30% mid-circumflex lesion, otherwise coronaries were clean angiographically  . Hyperlipidemia   . Hypertension   . OSA (obstructive sleep apnea)    Probable; study never scheduled    Past Surgical History:  Procedure Laterality Date  . [place], 2009   2009- negative  . COLONOSCOPY  2011   negative, [facility]  . PACEMAKER INSERTION  01/13/11   MDT by Greggory Brandy for complete heart block    Social History   Socioeconomic History  . Marital status: Married    Spouse name: Not on file  . Number of children:  Not on file  . Years of education: Not on file  . Highest education level: Not on file  Occupational History  . Not on file  Social Needs  . Financial resource strain: Not on file  . Food insecurity:    Worry: Not on file    Inability: Not on file  . Transportation needs:    Medical: Not on file    Non-medical: Not on file  Tobacco Use  . Smoking status: Former Smoker    Last attempt to quit: 11/13/2005    Years since quitting: 12.7  . Smokeless tobacco: Never Used  . Tobacco comment: smoked 1965- 2007, up to 2 ppd  Substance and Sexual Activity  . Alcohol use: Yes    Comment:  21 drinks/ week    . Drug use: No  . Sexual activity: Not on file  Lifestyle  . Physical activity:    Days per week: Not on file    Minutes per session: Not on file  . Stress: Not on file  Relationships  . Social connections:    Talks on phone: Not on file    Gets together: Not on file    Attends religious service: Not on file    Active member of club or organization: Not on file    Attends meetings of clubs or organizations: Not on file    Relationship status: Not on file  Other Topics Concern  . Not on file  Social History Narrative   Exercise: regularly - golf and walks - does both a few times a week    Family History  Problem Relation Age of Onset  . Heart attack Maternal Grandfather 56  . Diabetes Father   . Peripheral vascular disease Mother   . Cancer Mother        ? primary  . Arrhythmia Mother        AFIB  . Gout Brother   . Leukemia Brother   . Other Sister        prediabetic  . Stroke Neg Hx     Review of Systems  Constitutional: Negative for chills and fever.  Respiratory: Negative for cough, shortness of breath and wheezing.   Cardiovascular: Negative for chest pain, palpitations and leg swelling.  Gastrointestinal: Negative for abdominal pain and nausea.  Musculoskeletal: Positive for arthralgias.  Neurological: Positive for light-headedness (when bending over) and headaches (controlled).       Objective:   Vitals:   08/15/18 0819  BP: 124/72  Pulse: 78  Resp: 16  Temp: 97.9 F (36.6 C)  SpO2: 98%   BP Readings from Last 3 Encounters:  08/15/18 124/72  04/17/18 130/80  02/13/18 132/74   Wt Readings from Last 3 Encounters:  08/15/18 242 lb 12.8 oz (110.1 kg)  04/17/18 244 lb (110.7 kg)  02/13/18 246 lb (111.6 kg)   Body mass index is 28.79 kg/m.   Physical Exam    Constitutional: Appears well-developed and well-nourished. No distress.  HENT:  Head: Normocephalic and atraumatic.  Neck: Neck supple. No tracheal deviation present. No thyromegaly  present.  No cervical lymphadenopathy Cardiovascular: Normal rate, regular rhythm and normal heart sounds.   No murmur heard. No carotid bruit .  No edema Pulmonary/Chest: Effort normal and breath sounds normal. No respiratory distress. No has no wheezes. No rales.  Skin: Skin is warm and dry. Not diaphoretic.  Psychiatric: Normal mood and affect. Behavior is normal.      Assessment & Plan:    See  Problem List for Assessment and Plan of chronic medical problems.

## 2018-08-14 NOTE — Patient Instructions (Addendum)
  Tests ordered today. Your results will be released to MyChart (or called to you) after review, usually within 72hours after test completion. If any changes need to be made, you will be notified at that same time.  Flu immunization administered today.   Medications reviewed and updated.  Changes include :   none  Your prescription(s) have been submitted to your pharmacy. Please take as directed and contact our office if you believe you are having problem(s) with the medication(s).   Please followup in 6 months   

## 2018-08-15 ENCOUNTER — Encounter: Payer: Self-pay | Admitting: Internal Medicine

## 2018-08-15 ENCOUNTER — Other Ambulatory Visit (INDEPENDENT_AMBULATORY_CARE_PROVIDER_SITE_OTHER): Payer: PPO

## 2018-08-15 ENCOUNTER — Ambulatory Visit (INDEPENDENT_AMBULATORY_CARE_PROVIDER_SITE_OTHER): Payer: PPO | Admitting: Internal Medicine

## 2018-08-15 VITALS — BP 124/72 | HR 78 | Temp 97.9°F | Resp 16 | Ht 77.0 in | Wt 242.8 lb

## 2018-08-15 DIAGNOSIS — E7849 Other hyperlipidemia: Secondary | ICD-10-CM

## 2018-08-15 DIAGNOSIS — R519 Headache, unspecified: Secondary | ICD-10-CM

## 2018-08-15 DIAGNOSIS — R739 Hyperglycemia, unspecified: Secondary | ICD-10-CM | POA: Diagnosis not present

## 2018-08-15 DIAGNOSIS — Z8739 Personal history of other diseases of the musculoskeletal system and connective tissue: Secondary | ICD-10-CM | POA: Diagnosis not present

## 2018-08-15 DIAGNOSIS — Z23 Encounter for immunization: Secondary | ICD-10-CM

## 2018-08-15 DIAGNOSIS — R51 Headache: Secondary | ICD-10-CM

## 2018-08-15 DIAGNOSIS — I10 Essential (primary) hypertension: Secondary | ICD-10-CM | POA: Diagnosis not present

## 2018-08-15 LAB — LIPID PANEL
Cholesterol: 203 mg/dL — ABNORMAL HIGH (ref 0–200)
HDL: 56.3 mg/dL (ref >39.00)
LDL Cholesterol: 116 mg/dL — ABNORMAL HIGH (ref 0–99)
NONHDL: 146.67
Total CHOL/HDL Ratio: 4
Triglycerides: 154 mg/dL — ABNORMAL HIGH (ref 0.0–149.0)
VLDL: 30.8 mg/dL (ref 0.0–40.0)

## 2018-08-15 LAB — COMPREHENSIVE METABOLIC PANEL
ALK PHOS: 46 U/L (ref 39–117)
ALT: 66 U/L — ABNORMAL HIGH (ref 0–53)
AST: 61 U/L — ABNORMAL HIGH (ref 0–37)
Albumin: 4.7 g/dL (ref 3.5–5.2)
BUN: 19 mg/dL (ref 6–23)
CHLORIDE: 103 meq/L (ref 96–112)
CO2: 27 meq/L (ref 19–32)
Calcium: 10.3 mg/dL (ref 8.4–10.5)
Creatinine, Ser: 1.16 mg/dL (ref 0.40–1.50)
GFR: 66.04 mL/min (ref >60.00)
GLUCOSE: 107 mg/dL — AB (ref 70–99)
POTASSIUM: 4.4 meq/L (ref 3.5–5.1)
SODIUM: 138 meq/L (ref 135–145)
TOTAL PROTEIN: 7.7 g/dL (ref 6.0–8.3)
Total Bilirubin: 0.8 mg/dL (ref 0.2–1.2)

## 2018-08-15 MED ORDER — LOSARTAN POTASSIUM 100 MG PO TABS: 100.0000 mg | ORAL_TABLET | Freq: Every day | ORAL | 1 refills | Status: DC

## 2018-08-15 MED ORDER — SIMVASTATIN 40 MG PO TABS: ORAL_TABLET | ORAL | 1 refills | Status: DC

## 2018-08-15 NOTE — Addendum Note (Signed)
Addended by: Delice Bison E on: 08/15/2018 09:28 AM   Modules accepted: Orders

## 2018-08-15 NOTE — Assessment & Plan Note (Signed)
Controlled Taking topamax - continue current dose Takes advil daily Continue above

## 2018-08-15 NOTE — Assessment & Plan Note (Signed)
a1c has been stable and normal Will check a1c annually

## 2018-08-15 NOTE — Assessment & Plan Note (Signed)
Check lipid panel  Continue daily statin Regular exercise and healthy diet encouraged  

## 2018-08-15 NOTE — Assessment & Plan Note (Signed)
No gout flares in last 6 months The current medical regimen is effective;  continue present plan and medications.

## 2018-08-15 NOTE — Assessment & Plan Note (Signed)
BP well controlled Current regimen effective and well tolerated Continue current medications at current doses cmp  

## 2018-09-29 ENCOUNTER — Other Ambulatory Visit: Payer: Self-pay | Admitting: Internal Medicine

## 2018-10-02 ENCOUNTER — Ambulatory Visit (INDEPENDENT_AMBULATORY_CARE_PROVIDER_SITE_OTHER): Payer: PPO

## 2018-10-02 DIAGNOSIS — I442 Atrioventricular block, complete: Secondary | ICD-10-CM | POA: Diagnosis not present

## 2018-10-03 NOTE — Progress Notes (Signed)
Remote pacemaker transmission.   

## 2018-10-20 ENCOUNTER — Other Ambulatory Visit: Payer: Self-pay | Admitting: Internal Medicine

## 2018-11-27 LAB — CUP PACEART REMOTE DEVICE CHECK
Battery Remaining Longevity: 65 mo
Brady Statistic AP VS Percent: 0 %
Brady Statistic AS VS Percent: 0 %
Implantable Lead Implant Date: 20120302
Implantable Lead Location: 753859
Implantable Lead Location: 753860
Implantable Pulse Generator Implant Date: 20120302
Lead Channel Impedance Value: 657 Ohm
Lead Channel Pacing Threshold Amplitude: 0.625 V
Lead Channel Pacing Threshold Amplitude: 0.625 V
Lead Channel Pacing Threshold Pulse Width: 0.4 ms
Lead Channel Pacing Threshold Pulse Width: 0.4 ms
Lead Channel Setting Pacing Amplitude: 2 V
Lead Channel Setting Sensing Sensitivity: 2.8 mV
MDC IDC LEAD IMPLANT DT: 20120302
MDC IDC MSMT BATTERY IMPEDANCE: 873 Ohm
MDC IDC MSMT BATTERY VOLTAGE: 2.77 V
MDC IDC MSMT LEADCHNL RA IMPEDANCE VALUE: 494 Ohm
MDC IDC SESS DTM: 20191120151601
MDC IDC SET LEADCHNL RV PACING AMPLITUDE: 2.5 V
MDC IDC SET LEADCHNL RV PACING PULSEWIDTH: 0.4 ms
MDC IDC STAT BRADY AP VP PERCENT: 5 %
MDC IDC STAT BRADY AS VP PERCENT: 95 %

## 2018-12-29 ENCOUNTER — Other Ambulatory Visit: Payer: Self-pay | Admitting: Internal Medicine

## 2019-01-01 ENCOUNTER — Ambulatory Visit (INDEPENDENT_AMBULATORY_CARE_PROVIDER_SITE_OTHER): Payer: PPO

## 2019-01-01 DIAGNOSIS — I442 Atrioventricular block, complete: Secondary | ICD-10-CM | POA: Diagnosis not present

## 2019-01-02 LAB — CUP PACEART REMOTE DEVICE CHECK
Battery Impedance: 900 Ohm
Battery Voltage: 2.78 V
Brady Statistic AP VS Percent: 0 %
Brady Statistic AS VP Percent: 95 %
Implantable Lead Implant Date: 20120302
Implantable Lead Model: 5076
Lead Channel Impedance Value: 484 Ohm
Lead Channel Pacing Threshold Amplitude: 0.625 V
Lead Channel Pacing Threshold Pulse Width: 0.4 ms
Lead Channel Setting Pacing Amplitude: 2.5 V
Lead Channel Setting Pacing Pulse Width: 0.4 ms
MDC IDC LEAD IMPLANT DT: 20120302
MDC IDC LEAD LOCATION: 753859
MDC IDC LEAD LOCATION: 753860
MDC IDC MSMT BATTERY REMAINING LONGEVITY: 63 mo
MDC IDC MSMT LEADCHNL RV IMPEDANCE VALUE: 635 Ohm
MDC IDC MSMT LEADCHNL RV PACING THRESHOLD AMPLITUDE: 0.5 V
MDC IDC MSMT LEADCHNL RV PACING THRESHOLD PULSEWIDTH: 0.4 ms
MDC IDC PG IMPLANT DT: 20120302
MDC IDC SESS DTM: 20200219155303
MDC IDC SET LEADCHNL RA PACING AMPLITUDE: 2 V
MDC IDC SET LEADCHNL RV SENSING SENSITIVITY: 2.8 mV
MDC IDC STAT BRADY AP VP PERCENT: 4 %
MDC IDC STAT BRADY AS VS PERCENT: 0 %

## 2019-01-08 NOTE — Progress Notes (Signed)
Remote pacemaker transmission.   

## 2019-01-10 ENCOUNTER — Encounter: Payer: Self-pay | Admitting: Cardiology

## 2019-02-12 ENCOUNTER — Ambulatory Visit: Payer: PPO | Admitting: Internal Medicine

## 2019-04-02 ENCOUNTER — Ambulatory Visit (INDEPENDENT_AMBULATORY_CARE_PROVIDER_SITE_OTHER): Payer: PPO | Admitting: *Deleted

## 2019-04-02 ENCOUNTER — Other Ambulatory Visit: Payer: Self-pay

## 2019-04-02 DIAGNOSIS — I442 Atrioventricular block, complete: Secondary | ICD-10-CM | POA: Diagnosis not present

## 2019-04-03 LAB — CUP PACEART REMOTE DEVICE CHECK
Battery Impedance: 1002 Ohm
Battery Remaining Longevity: 59 mo
Battery Voltage: 2.77 V
Brady Statistic AP VP Percent: 4 %
Brady Statistic AP VS Percent: 0 %
Brady Statistic AS VP Percent: 95 %
Brady Statistic AS VS Percent: 0 %
Date Time Interrogation Session: 20200520194707
Implantable Lead Implant Date: 20120302
Implantable Lead Implant Date: 20120302
Implantable Lead Location: 753859
Implantable Lead Location: 753860
Implantable Lead Model: 5076
Implantable Pulse Generator Implant Date: 20120302
Lead Channel Impedance Value: 465 Ohm
Lead Channel Impedance Value: 635 Ohm
Lead Channel Pacing Threshold Amplitude: 0.5 V
Lead Channel Pacing Threshold Amplitude: 0.75 V
Lead Channel Pacing Threshold Pulse Width: 0.4 ms
Lead Channel Pacing Threshold Pulse Width: 0.4 ms
Lead Channel Setting Pacing Amplitude: 2 V
Lead Channel Setting Pacing Amplitude: 2.5 V
Lead Channel Setting Pacing Pulse Width: 0.4 ms
Lead Channel Setting Sensing Sensitivity: 2.8 mV

## 2019-04-04 ENCOUNTER — Other Ambulatory Visit: Payer: Self-pay | Admitting: Internal Medicine

## 2019-04-08 ENCOUNTER — Other Ambulatory Visit: Payer: Self-pay | Admitting: Internal Medicine

## 2019-04-11 ENCOUNTER — Encounter: Payer: Self-pay | Admitting: Cardiology

## 2019-04-11 NOTE — Progress Notes (Signed)
Remote pacemaker transmission.   

## 2019-04-20 ENCOUNTER — Encounter: Payer: Self-pay | Admitting: Internal Medicine

## 2019-04-20 ENCOUNTER — Other Ambulatory Visit: Payer: Self-pay | Admitting: Internal Medicine

## 2019-04-21 ENCOUNTER — Other Ambulatory Visit: Payer: Self-pay

## 2019-04-21 MED ORDER — LOSARTAN POTASSIUM 100 MG PO TABS: 100.0000 mg | ORAL_TABLET | Freq: Every day | ORAL | 0 refills | Status: DC

## 2019-04-22 ENCOUNTER — Encounter: Payer: Self-pay | Admitting: Gastroenterology

## 2019-04-29 ENCOUNTER — Ambulatory Visit: Payer: PPO | Admitting: Internal Medicine

## 2019-07-02 ENCOUNTER — Ambulatory Visit (INDEPENDENT_AMBULATORY_CARE_PROVIDER_SITE_OTHER): Payer: PPO | Admitting: *Deleted

## 2019-07-02 DIAGNOSIS — I442 Atrioventricular block, complete: Secondary | ICD-10-CM | POA: Diagnosis not present

## 2019-07-02 LAB — CUP PACEART REMOTE DEVICE CHECK
Battery Impedance: 1107 Ohm
Battery Remaining Longevity: 56 mo
Battery Voltage: 2.77 V
Brady Statistic AP VP Percent: 4 %
Brady Statistic AP VS Percent: 0 %
Brady Statistic AS VP Percent: 95 %
Brady Statistic AS VS Percent: 1 %
Date Time Interrogation Session: 20200819161528
Implantable Lead Implant Date: 20120302
Implantable Lead Implant Date: 20120302
Implantable Lead Location: 753859
Implantable Lead Location: 753860
Implantable Lead Model: 5076
Implantable Pulse Generator Implant Date: 20120302
Lead Channel Impedance Value: 494 Ohm
Lead Channel Impedance Value: 627 Ohm
Lead Channel Pacing Threshold Amplitude: 0.625 V
Lead Channel Pacing Threshold Amplitude: 0.625 V
Lead Channel Pacing Threshold Pulse Width: 0.4 ms
Lead Channel Pacing Threshold Pulse Width: 0.4 ms
Lead Channel Setting Pacing Amplitude: 2 V
Lead Channel Setting Pacing Amplitude: 2.5 V
Lead Channel Setting Pacing Pulse Width: 0.4 ms
Lead Channel Setting Sensing Sensitivity: 2 mV

## 2019-07-10 ENCOUNTER — Encounter: Payer: Self-pay | Admitting: Cardiology

## 2019-07-10 NOTE — Progress Notes (Signed)
Remote pacemaker transmission.   

## 2019-07-14 ENCOUNTER — Other Ambulatory Visit: Payer: Self-pay | Admitting: Internal Medicine

## 2019-07-17 ENCOUNTER — Other Ambulatory Visit: Payer: Self-pay | Admitting: Internal Medicine

## 2019-08-04 NOTE — Progress Notes (Signed)
Subjective:    Patient ID: [name], male    DOB: 05-Nov-1946, 73 y.o.   MRN: [id number]  HPI The patient is here for follow up.  He is not exercising regularly.    He is having both knee pain and right hip pain.  He uses a topical arthritis medication. He has had acupuncture which has helped.  He is not golfing due to the pain.  He is not interested in having surgery.  He does not feel like he needs to see a specialist at this time.  He has had knee surgeries in the past.  Hypertension: He is taking his medication daily. He is compliant with a low sodium diet.  He denies chest pain, palpitations, edema, shortness of breath and regular headaches.  He does not monitor his blood pressure at home.    Hyperlipidemia: He is taking his medication daily. He is compliant with a low fat/cholesterol diet. He denies myalgias.   Hyperglycemia:  He is compliant with a low sugar/carbohydrate diet.  He is not exercising regularly.  Gout:  He takes allopurinol daily.  He denies gout flares.  Chronic headaches:  He is taking topamax daily.  He takes advil once daily.  He feels his headaches are controlled.     Medications and allergies reviewed with patient and updated if appropriate.  Patient Active Problem List   Diagnosis Date Noted  . Erectile dysfunction 03/03/2017  . Chronic headaches 02/13/2017  . Snoring 02/15/2015  . Elevated liver enzymes 09/13/2014  . Dupuytren's contracture of both hands 09/02/2013  . History of gout 12/27/2011  . Essential hypertension 01/12/2011  . Complete heart block ([place]) 01/12/2011  . Hyperglycemia 03/02/2009  . HYPERPLASIA PROSTATE UNS W/O UR OBST & OTH LUTS 08/13/2008  . NONSPECIFIC ABNORMAL ELECTROCARDIOGRAM 06/04/2008  . Hyperlipidemia 02/17/2008  . PAROXYSMAL SUPRAVENTRICULAR TACHYCARDIA 02/17/2008  . MITRAL VALVE PROLAPSE 04/17/2007    Current Outpatient Medications on File Prior to Visit  Medication Sig Dispense Refill  . allopurinol  (ZYLOPRIM) 300 MG tablet TAKE 0.5 TABLETS (150 MG TOTAL) BY MOUTH DAILY. 45 tablet 0  . clobetasol (TEMOVATE) 0.05 % cream Apply 1 application topically daily as needed (itching).     . fish oil-omega-3 fatty acids 1000 MG capsule Take 1 g by mouth 2 (two) times daily.      Marland Kitchen ibuprofen (ADVIL,MOTRIN) 200 MG tablet Take 200 mg by mouth daily.    Marland Kitchen losartan (COZAAR) 100 MG tablet Take 1 tablet (100 mg total) by mouth daily. Need office visit for more refills 90 tablet 0  . sildenafil (REVATIO) 20 MG tablet Take 3-5 pills as needed 50 tablet 5  . simvastatin (ZOCOR) 40 MG tablet TAKE 1 TABLET BY MOUTH EVERYDAY AT BEDTIME 90 tablet 0  . topiramate (TOPAMAX) 50 MG tablet TAKE 1 TABLET BY MOUTH TWICE A DAY 180 tablet 0   No current facility-administered medications on file prior to visit.     Past Medical History:  Diagnosis Date  . Complete heart block (HCC)    S/P  PPM by Dr Rayann Heman  . Coronary artery disease    Non-obstructive. Pt had left heart catheterization in August 2009 showing a 30% mid-circumflex lesion, otherwise coronaries were clean angiographically  . Hyperlipidemia   . Hypertension   . OSA (obstructive sleep apnea)    Probable; study never scheduled    Past Surgical History:  Procedure Laterality Date  . [place], 2009   2009- negative  .  COLONOSCOPY  2011   negative, [facility]  . PACEMAKER INSERTION  01/13/11   MDT by Greggory Brandy for complete heart block    Social History   Socioeconomic History  . Marital status: Married    Spouse name: Not on file  . Number of children: Not on file  . Years of education: Not on file  . Highest education level: Not on file  Occupational History  . Not on file  Social Needs  . Financial resource strain: Not on file  . Food insecurity    Worry: Not on file    Inability: Not on file  . Transportation needs    Medical: Not on file    Non-medical: Not on file  Tobacco Use  . Smoking status: Former Smoker    Quit  date: 11/13/2005    Years since quitting: 13.7  . Smokeless tobacco: Never Used  . Tobacco comment: smoked 1965- 2007, up to 2 ppd  Substance and Sexual Activity  . Alcohol use: Yes    Comment:  21 drinks/ week  . Drug use: No  . Sexual activity: Not on file  Lifestyle  . Physical activity    Days per week: Not on file    Minutes per session: Not on file  . Stress: Not on file  Relationships  . Social Herbalist on phone: Not on file    Gets together: Not on file    Attends religious service: Not on file    Active member of club or organization: Not on file    Attends meetings of clubs or organizations: Not on file    Relationship status: Not on file  Other Topics Concern  . Not on file  Social History Narrative   Exercise: regularly - golf and walks - does both a few times a week    Family History  Problem Relation Age of Onset  . Heart attack Maternal Grandfather 56  . Diabetes Father   . Peripheral vascular disease Mother   . Cancer Mother        ? primary  . Arrhythmia Mother        AFIB  . Gout Brother   . Leukemia Brother   . Other Sister        prediabetic  . Stroke Neg Hx     Review of Systems  Constitutional: Negative for chills and fever.  Respiratory: Negative for cough, shortness of breath and wheezing.   Cardiovascular: Negative for chest pain, palpitations and leg swelling.  Musculoskeletal: Positive for arthralgias (knees, R hip).  Neurological: Positive for dizziness (occ) and headaches (controlled). Negative for light-headedness.       Objective:   Vitals:   08/05/19 0832  BP: 134/78  Pulse: 79  Resp: 16  Temp: 98.4 F (36.9 C)  SpO2: 96%   BP Readings from Last 3 Encounters:  08/05/19 134/78  08/15/18 124/72  04/17/18 130/80   Wt Readings from Last 3 Encounters:  08/05/19 238 lb (108 kg)  08/15/18 242 lb 12.8 oz (110.1 kg)  04/17/18 244 lb (110.7 kg)   Body mass index is 28.22 kg/m.   Physical Exam     Constitutional: Appears well-developed and well-nourished. No distress.  HENT:  Head: Normocephalic and atraumatic.  Neck: Neck supple. No tracheal deviation present. No thyromegaly present.  No cervical lymphadenopathy Cardiovascular: Normal rate, regular rhythm and normal heart sounds.  No murmur heard. No carotid bruit .  No edema Pulmonary/Chest: Effort normal and  breath sounds normal. No respiratory distress. No has no wheezes. No rales.  Skin: Skin is warm and dry. Not diaphoretic.  Psychiatric: Normal mood and affect. Behavior is normal.      Assessment & Plan:    See Problem List for Assessment and Plan of chronic medical problems.

## 2019-08-04 NOTE — Patient Instructions (Addendum)
  Tests ordered today. Your results will be released to MyChart (or called to you) after review.  If any changes need to be made, you will be notified at that same time.   Flu immunization administered today.    Medications reviewed and updated.  Changes include :   none  Your prescription(s) have been submitted to your pharmacy. Please take as directed and contact our office if you believe you are having problem(s) with the medication(s).    Please followup in 6 months   

## 2019-08-05 ENCOUNTER — Other Ambulatory Visit (INDEPENDENT_AMBULATORY_CARE_PROVIDER_SITE_OTHER): Payer: PPO

## 2019-08-05 ENCOUNTER — Ambulatory Visit (INDEPENDENT_AMBULATORY_CARE_PROVIDER_SITE_OTHER): Payer: PPO | Admitting: Internal Medicine

## 2019-08-05 ENCOUNTER — Encounter: Payer: Self-pay | Admitting: Internal Medicine

## 2019-08-05 ENCOUNTER — Other Ambulatory Visit: Payer: Self-pay

## 2019-08-05 VITALS — BP 134/78 | HR 79 | Temp 98.4°F | Resp 16 | Ht 77.0 in | Wt 238.0 lb

## 2019-08-05 DIAGNOSIS — I10 Essential (primary) hypertension: Secondary | ICD-10-CM

## 2019-08-05 DIAGNOSIS — E7849 Other hyperlipidemia: Secondary | ICD-10-CM

## 2019-08-05 DIAGNOSIS — I872 Venous insufficiency (chronic) (peripheral): Secondary | ICD-10-CM | POA: Diagnosis not present

## 2019-08-05 DIAGNOSIS — Z1283 Encounter for screening for malignant neoplasm of skin: Secondary | ICD-10-CM | POA: Diagnosis not present

## 2019-08-05 DIAGNOSIS — R51 Headache: Secondary | ICD-10-CM

## 2019-08-05 DIAGNOSIS — G8929 Other chronic pain: Secondary | ICD-10-CM

## 2019-08-05 DIAGNOSIS — L218 Other seborrheic dermatitis: Secondary | ICD-10-CM | POA: Diagnosis not present

## 2019-08-05 DIAGNOSIS — Z8739 Personal history of other diseases of the musculoskeletal system and connective tissue: Secondary | ICD-10-CM

## 2019-08-05 DIAGNOSIS — Z23 Encounter for immunization: Secondary | ICD-10-CM | POA: Diagnosis not present

## 2019-08-05 DIAGNOSIS — R739 Hyperglycemia, unspecified: Secondary | ICD-10-CM | POA: Diagnosis not present

## 2019-08-05 DIAGNOSIS — L821 Other seborrheic keratosis: Secondary | ICD-10-CM | POA: Diagnosis not present

## 2019-08-05 DIAGNOSIS — L304 Erythema intertrigo: Secondary | ICD-10-CM | POA: Diagnosis not present

## 2019-08-05 LAB — COMPREHENSIVE METABOLIC PANEL
ALT: 61 U/L — ABNORMAL HIGH (ref 0–53)
AST: 49 U/L — ABNORMAL HIGH (ref 0–37)
Albumin: 4.6 g/dL (ref 3.5–5.2)
Alkaline Phosphatase: 46 U/L (ref 39–117)
BUN: 18 mg/dL (ref 6–23)
CO2: 24 mEq/L (ref 19–32)
Calcium: 10.4 mg/dL (ref 8.4–10.5)
Chloride: 105 mEq/L (ref 96–112)
Creatinine, Ser: 1.15 mg/dL (ref 0.40–1.50)
GFR: 62.58 mL/min (ref >60.00)
Glucose, Bld: 103 mg/dL — ABNORMAL HIGH (ref 70–99)
Potassium: 4.1 mEq/L (ref 3.5–5.1)
Sodium: 139 mEq/L (ref 135–145)
Total Bilirubin: 0.6 mg/dL (ref 0.2–1.2)
Total Protein: 7.3 g/dL (ref 6.0–8.3)

## 2019-08-05 LAB — CBC WITH DIFFERENTIAL/PLATELET
Basophils Absolute: 0 10*3/uL (ref 0.0–0.1)
Basophils Relative: 0.8 % (ref 0.0–3.0)
Eosinophils Absolute: 0.2 10*3/uL (ref 0.0–0.7)
Eosinophils Relative: 4.5 % (ref 0.0–5.0)
HCT: 44.6 % (ref 39.0–52.0)
Hemoglobin: 15.2 g/dL (ref 13.0–17.0)
Lymphocytes Relative: 44.1 % (ref 12.0–46.0)
Lymphs Abs: 2.3 10*3/uL (ref 0.7–4.0)
MCHC: 34 g/dL (ref 30.0–36.0)
MCV: 105.9 fl — ABNORMAL HIGH (ref 78.0–100.0)
Monocytes Absolute: 0.4 10*3/uL (ref 0.1–1.0)
Monocytes Relative: 7.4 % (ref 3.0–12.0)
Neutro Abs: 2.3 10*3/uL (ref 1.4–7.7)
Neutrophils Relative %: 43.2 % (ref 43.0–77.0)
Platelets: 165 10*3/uL (ref 150.0–400.0)
RBC: 4.21 Mil/uL — ABNORMAL LOW (ref 4.22–5.81)
RDW: 13.7 % (ref 11.5–15.5)
WBC: 5.3 10*3/uL (ref 4.0–10.5)

## 2019-08-05 LAB — URIC ACID: Uric Acid, Serum: 4.5 mg/dL (ref 4.0–7.8)

## 2019-08-05 LAB — LIPID PANEL
Cholesterol: 176 mg/dL (ref 0–200)
HDL: 51.3 mg/dL (ref >39.00)
LDL Cholesterol: 90 mg/dL (ref 0–99)
NonHDL: 124.91
Total CHOL/HDL Ratio: 3
Triglycerides: 175 mg/dL — ABNORMAL HIGH (ref 0.0–149.0)
VLDL: 35 mg/dL (ref 0.0–40.0)

## 2019-08-05 LAB — HEMOGLOBIN A1C: Hgb A1c MFr Bld: 5.5 % (ref 4.6–6.5)

## 2019-08-05 LAB — TSH: TSH: 2.95 u[IU]/mL (ref 0.35–4.50)

## 2019-08-05 MED ORDER — LOSARTAN POTASSIUM 100 MG PO TABS: 100.0000 mg | ORAL_TABLET | Freq: Every day | ORAL | 1 refills | Status: DC

## 2019-08-05 MED ORDER — SILDENAFIL CITRATE 20 MG PO TABS: ORAL_TABLET | ORAL | 5 refills | Status: DC

## 2019-08-05 MED ORDER — ALLOPURINOL 300 MG PO TABS: 150.0000 mg | ORAL_TABLET | Freq: Every day | ORAL | 1 refills | Status: DC

## 2019-08-05 MED ORDER — TOPIRAMATE 50 MG PO TABS: 50.0000 mg | ORAL_TABLET | Freq: Two times a day (BID) | ORAL | 1 refills | Status: DC

## 2019-08-05 MED ORDER — SIMVASTATIN 40 MG PO TABS: ORAL_TABLET | ORAL | 1 refills | Status: DC

## 2019-08-05 NOTE — Assessment & Plan Note (Signed)
Overall controlled Continue Topamax Takes 1 Advil daily

## 2019-08-05 NOTE — Assessment & Plan Note (Signed)
History of gout, no gout symptoms since he was here last Continue allopurinol at current dose He is avoiding the food that typically triggers his gout Check uric acid level

## 2019-08-05 NOTE — Assessment & Plan Note (Signed)
BP well controlled Current regimen effective and well tolerated Continue current medications at current doses CMP 

## 2019-08-05 NOTE — Assessment & Plan Note (Signed)
Check a1c Low sugar / carb diet Stressed regular exercise   

## 2019-08-05 NOTE — Assessment & Plan Note (Signed)
Check lipid panel, CMP, TSH Continue daily statin Regular exercise and healthy diet encouraged  

## 2019-08-06 ENCOUNTER — Encounter: Payer: Self-pay | Admitting: Internal Medicine

## 2019-08-19 DIAGNOSIS — H524 Presbyopia: Secondary | ICD-10-CM | POA: Diagnosis not present

## 2019-08-19 DIAGNOSIS — H52223 Regular astigmatism, bilateral: Secondary | ICD-10-CM | POA: Diagnosis not present

## 2019-08-19 DIAGNOSIS — H5201 Hypermetropia, right eye: Secondary | ICD-10-CM | POA: Diagnosis not present

## 2019-09-02 DIAGNOSIS — X32XXXD Exposure to sunlight, subsequent encounter: Secondary | ICD-10-CM | POA: Diagnosis not present

## 2019-09-02 DIAGNOSIS — L821 Other seborrheic keratosis: Secondary | ICD-10-CM | POA: Diagnosis not present

## 2019-09-02 DIAGNOSIS — L430 Hypertrophic lichen planus: Secondary | ICD-10-CM | POA: Diagnosis not present

## 2019-09-02 DIAGNOSIS — L570 Actinic keratosis: Secondary | ICD-10-CM | POA: Diagnosis not present

## 2019-09-02 DIAGNOSIS — L820 Inflamed seborrheic keratosis: Secondary | ICD-10-CM | POA: Diagnosis not present

## 2019-09-23 DIAGNOSIS — L570 Actinic keratosis: Secondary | ICD-10-CM | POA: Diagnosis not present

## 2019-09-23 DIAGNOSIS — D0462 Carcinoma in situ of skin of left upper limb, including shoulder: Secondary | ICD-10-CM | POA: Diagnosis not present

## 2019-09-23 DIAGNOSIS — X32XXXD Exposure to sunlight, subsequent encounter: Secondary | ICD-10-CM | POA: Diagnosis not present

## 2019-09-23 DIAGNOSIS — L820 Inflamed seborrheic keratosis: Secondary | ICD-10-CM | POA: Diagnosis not present

## 2019-10-01 ENCOUNTER — Ambulatory Visit (INDEPENDENT_AMBULATORY_CARE_PROVIDER_SITE_OTHER): Payer: PPO | Admitting: *Deleted

## 2019-10-01 DIAGNOSIS — I442 Atrioventricular block, complete: Secondary | ICD-10-CM

## 2019-10-01 DIAGNOSIS — I471 Supraventricular tachycardia: Secondary | ICD-10-CM

## 2019-10-02 LAB — CUP PACEART REMOTE DEVICE CHECK
Battery Impedance: 1266 Ohm
Battery Remaining Longevity: 50 mo
Battery Voltage: 2.77 V
Brady Statistic AP VP Percent: 5 %
Brady Statistic AP VS Percent: 0 %
Brady Statistic AS VP Percent: 94 %
Brady Statistic AS VS Percent: 1 %
Date Time Interrogation Session: 20201118104734
Implantable Lead Implant Date: 20120302
Implantable Lead Implant Date: 20120302
Implantable Lead Location: 753859
Implantable Lead Location: 753860
Implantable Lead Model: 5076
Implantable Pulse Generator Implant Date: 20120302
Lead Channel Impedance Value: 480 Ohm
Lead Channel Impedance Value: 569 Ohm
Lead Channel Pacing Threshold Amplitude: 0.5 V
Lead Channel Pacing Threshold Amplitude: 0.625 V
Lead Channel Pacing Threshold Pulse Width: 0.4 ms
Lead Channel Pacing Threshold Pulse Width: 0.4 ms
Lead Channel Setting Pacing Amplitude: 2 V
Lead Channel Setting Pacing Amplitude: 2.5 V
Lead Channel Setting Pacing Pulse Width: 0.4 ms
Lead Channel Setting Sensing Sensitivity: 2.8 mV

## 2019-10-08 ENCOUNTER — Encounter: Payer: Self-pay | Admitting: Internal Medicine

## 2019-10-08 DIAGNOSIS — Z1283 Encounter for screening for malignant neoplasm of skin: Secondary | ICD-10-CM

## 2019-10-29 NOTE — Progress Notes (Signed)
Remote pacemaker transmission.   

## 2019-11-03 ENCOUNTER — Encounter: Payer: Self-pay | Admitting: Internal Medicine

## 2019-12-28 ENCOUNTER — Ambulatory Visit: Payer: PPO | Attending: Internal Medicine

## 2019-12-28 DIAGNOSIS — Z23 Encounter for immunization: Secondary | ICD-10-CM | POA: Insufficient documentation

## 2019-12-28 NOTE — Progress Notes (Signed)
   Covid-19 Vaccination Clinic  Name:  KHABIR SCHETTINI    MRN: [id number] DOB: Jul 13, 1946  12/28/2019  Mr. Fricker was observed post Covid-19 immunization for 15 minutes without incidence. He was provided with Vaccine Information Sheet and instruction to access the V-Safe system.   Mr. Donez was instructed to call 911 with any severe reactions post vaccine: Marland Kitchen Difficulty breathing  . Swelling of your face and throat  . A fast heartbeat  . A bad rash all over your body  . Dizziness and weakness    Immunizations Administered    Name Date Dose VIS Date Route   Pfizer COVID-19 Vaccine 12/28/2019  1:27 PM 0.3 mL 10/24/2019 Intramuscular   Manufacturer: [place]   Lot: [id number]   [place]: [id number]

## 2019-12-31 ENCOUNTER — Ambulatory Visit (INDEPENDENT_AMBULATORY_CARE_PROVIDER_SITE_OTHER): Payer: PPO | Admitting: *Deleted

## 2019-12-31 DIAGNOSIS — I442 Atrioventricular block, complete: Secondary | ICD-10-CM | POA: Diagnosis not present

## 2019-12-31 LAB — CUP PACEART REMOTE DEVICE CHECK
Battery Impedance: 1375 Ohm
Battery Remaining Longevity: 48 mo
Battery Voltage: 2.76 V
Brady Statistic AP VP Percent: 5 %
Brady Statistic AP VS Percent: 0 %
Brady Statistic AS VP Percent: 94 %
Brady Statistic AS VS Percent: 1 %
Date Time Interrogation Session: 20210217115111
Implantable Lead Implant Date: 20120302
Implantable Lead Implant Date: 20120302
Implantable Lead Location: 753859
Implantable Lead Location: 753860
Implantable Lead Model: 5076
Implantable Pulse Generator Implant Date: 20120302
Lead Channel Impedance Value: 493 Ohm
Lead Channel Impedance Value: 654 Ohm
Lead Channel Pacing Threshold Amplitude: 0.625 V
Lead Channel Pacing Threshold Amplitude: 0.625 V
Lead Channel Pacing Threshold Pulse Width: 0.4 ms
Lead Channel Pacing Threshold Pulse Width: 0.4 ms
Lead Channel Setting Pacing Amplitude: 2 V
Lead Channel Setting Pacing Amplitude: 2.5 V
Lead Channel Setting Pacing Pulse Width: 0.4 ms
Lead Channel Setting Sensing Sensitivity: 2.8 mV

## 2019-12-31 NOTE — Progress Notes (Signed)
PPM Remote  

## 2020-01-20 ENCOUNTER — Ambulatory Visit: Payer: PPO | Attending: Internal Medicine

## 2020-01-20 DIAGNOSIS — Z23 Encounter for immunization: Secondary | ICD-10-CM | POA: Insufficient documentation

## 2020-01-20 NOTE — Progress Notes (Signed)
   Covid-19 Vaccination Clinic  Name:  [name]    MRN: [id number] DOB: 1946-10-30  01/20/2020  Mr. Reller was observed post Covid-19 immunization for 15 minutes without incident. He was provided with Vaccine Information Sheet and instruction to access the V-Safe system.   Mr. Koppel was instructed to call 911 with any severe reactions post vaccine: Marland Kitchen Difficulty breathing  . Swelling of face and throat  . A fast heartbeat  . A bad rash all over body  . Dizziness and weakness   Immunizations Administered    Name Date Dose VIS Date Route   Pfizer COVID-19 Vaccine 01/20/2020 12:19 PM 0.3 mL 10/24/2019 Intramuscular   Manufacturer: [place]   Lot: [id number]   [place]: [id number]

## 2020-01-21 ENCOUNTER — Ambulatory Visit: Payer: PPO

## 2020-01-27 ENCOUNTER — Telehealth: Payer: Self-pay | Admitting: Internal Medicine

## 2020-01-27 NOTE — Progress Notes (Signed)
  Chronic Care Management   Note  01/27/2020 Name: ROARKE DUTKIEWICZ MRN: [id number] DOB: 05-02-1946  Tayvon L Simic is a 74 y.o. year old male who is a primary care patient of Burns, Claudina Lick, MD. I reached out to [place] by phone today in response to a referral sent by Mr. Keone Grantham Magadan's PCP, Binnie Rail, MD.   Mr. Irene was given information about Chronic Care Management services today including:  1. CCM service includes personalized support from designated clinical staff supervised by his physician, including individualized plan of care and coordination with other care providers 2. 24/7 contact phone numbers for assistance for urgent and routine care needs. 3. Service will only be billed when office clinical staff spend 20 minutes or more in a month to coordinate care. 4. Only one practitioner may furnish and bill the service in a calendar month. 5. The patient may stop CCM services at any time (effective at the end of the month) by phone call to the office staff.   Patient did not agree to enrollment in care management services and does not wish to consider at this time.  Follow up plan:   Raynicia Dukes [employer] Scheduler

## 2020-02-02 NOTE — Progress Notes (Signed)
Subjective:    Patient ID: [name], male    DOB: 1946/08/03, 74 y.o.   MRN: [id number]  HPI The patient is here for follow up of their chronic medical problems, including hypertension, hyperlipidemia, hyperglycemia, gout, chronic headaches.  He continues to have knee pain and right hip pain.   He is taking all of his medications as prescribed.     He is not exercising regularly.   He does feel stiff.     He continues to have daily headaches.  He has no concerns.   Medications and allergies reviewed with patient and updated if appropriate.  Patient Active Problem List   Diagnosis Date Noted  . Erectile dysfunction 03/03/2017  . Chronic headaches 02/13/2017  . Snoring 02/15/2015  . Elevated liver enzymes 09/13/2014  . Dupuytren's contracture of both hands 09/02/2013  . History of gout 12/27/2011  . Essential hypertension 01/12/2011  . Complete heart block ([place]) 01/12/2011  . Hyperglycemia 03/02/2009  . HYPERPLASIA PROSTATE UNS W/O UR OBST & OTH LUTS 08/13/2008  . NONSPECIFIC ABNORMAL ELECTROCARDIOGRAM 06/04/2008  . Hyperlipidemia 02/17/2008  . PAROXYSMAL SUPRAVENTRICULAR TACHYCARDIA 02/17/2008  . MITRAL VALVE PROLAPSE 04/17/2007    Current Outpatient Medications on File Prior to Visit  Medication Sig Dispense Refill  . allopurinol (ZYLOPRIM) 300 MG tablet Take 0.5 tablets (150 mg total) by mouth daily. 45 tablet 1  . clobetasol (TEMOVATE) 0.05 % cream Apply 1 application topically daily as needed (itching).     . fish oil-omega-3 fatty acids 1000 MG capsule Take 1 g by mouth 2 (two) times daily.      Marland Kitchen ibuprofen (ADVIL,MOTRIN) 200 MG tablet Take 200 mg by mouth daily.    Marland Kitchen losartan (COZAAR) 100 MG tablet Take 1 tablet (100 mg total) by mouth daily. Need office visit for more refills 90 tablet 1  . sildenafil (REVATIO) 20 MG tablet Take 3-5 pills as needed 50 tablet 5  . simvastatin (ZOCOR) 40 MG tablet TAKE 1 TABLET BY MOUTH EVERYDAY AT BEDTIME 90 tablet 1  .  topiramate (TOPAMAX) 50 MG tablet Take 1 tablet (50 mg total) by mouth 2 (two) times daily. 180 tablet 1   No current facility-administered medications on file prior to visit.    Past Medical History:  Diagnosis Date  . Complete heart block (HCC)    S/P  PPM by Dr Rayann Heman  . Coronary artery disease    Non-obstructive. Pt had left heart catheterization in August 2009 showing a 30% mid-circumflex lesion, otherwise coronaries were clean angiographically  . Hyperlipidemia   . Hypertension   . OSA (obstructive sleep apnea)    Probable; study never scheduled    Past Surgical History:  Procedure Laterality Date  . [place], 2009   2009- negative  . COLONOSCOPY  2011   negative, [facility]  . PACEMAKER INSERTION  01/13/11   MDT by Greggory Brandy for complete heart block    Social History   Socioeconomic History  . Marital status: Married    Spouse name: Not on file  . Number of children: Not on file  . Years of education: Not on file  . Highest education level: Not on file  Occupational History  . Not on file  Tobacco Use  . Smoking status: Former Smoker    Quit date: 11/13/2005    Years since quitting: 14.2  . Smokeless tobacco: Never Used  . Tobacco comment: smoked 1965- 2007, up to 2 ppd  Substance and Sexual Activity  .  Alcohol use: Yes    Comment:  21 drinks/ week  . Drug use: No  . Sexual activity: Not on file  Other Topics Concern  . Not on file  Social History Narrative   Exercise: regularly - golf and walks - does both a few times a week   Social Determinants of Health   Financial Resource Strain:   . Difficulty of Paying Living Expenses:   Food Insecurity:   . Worried About Charity fundraiser in the Last Year:   . Arboriculturist in the Last Year:   Transportation Needs:   . Film/video editor (Medical):   Marland Kitchen Lack of Transportation (Non-Medical):   Physical Activity:   . Days of Exercise per Week:   . Minutes of Exercise per Session:     Stress:   . Feeling of Stress :   Social Connections:   . Frequency of Communication with Friends and Family:   . Frequency of Social Gatherings with Friends and Family:   . Attends Religious Services:   . Active Member of Clubs or Organizations:   . Attends Archivist Meetings:   Marland Kitchen Marital Status:     Family History  Problem Relation Age of Onset  . Heart attack Maternal Grandfather 56  . Diabetes Father   . Peripheral vascular disease Mother   . Cancer Mother        ? primary  . Arrhythmia Mother        AFIB  . Gout Brother   . Leukemia Brother   . Other Sister        prediabetic  . Stroke Neg Hx     Review of Systems  Constitutional: Negative for chills and fever.  Respiratory: Negative for cough, shortness of breath and wheezing.   Cardiovascular: Negative for chest pain, palpitations and leg swelling.  Gastrointestinal: Negative for abdominal pain.       No gerd  Neurological: Positive for headaches. Negative for dizziness and light-headedness.       Objective:   Vitals:   02/03/20 0823  BP: 140/84  Pulse: 72  Resp: 16  Temp: 97.7 F (36.5 C)  SpO2: 98%   BP Readings from Last 3 Encounters:  02/03/20 140/84  08/05/19 134/78  08/15/18 124/72   Wt Readings from Last 3 Encounters:  02/03/20 237 lb (107.5 kg)  08/05/19 238 lb (108 kg)  08/15/18 242 lb 12.8 oz (110.1 kg)   Body mass index is 28.1 kg/m.   Physical Exam    Constitutional: Appears well-developed and well-nourished. No distress.  HENT:  Head: Normocephalic and atraumatic.  Neck: Neck supple. No tracheal deviation present. No thyromegaly present.  No cervical lymphadenopathy Cardiovascular: Normal rate, regular rhythm and normal heart sounds.   No murmur heard. No carotid bruit .  No edema Pulmonary/Chest: Effort normal and breath sounds normal. No respiratory distress. No has no wheezes. No rales.  Skin: Skin is warm and dry. Not diaphoretic.  Psychiatric: Normal mood and  affect. Behavior is normal.      Assessment & Plan:    See Problem List for Assessment and Plan of chronic medical problems.    This visit occurred during the SARS-CoV-2 public health emergency.  Safety protocols were in place, including screening questions prior to the visit, additional usage of staff PPE, and extensive cleaning of exam room while observing appropriate contact time as indicated for disinfecting solutions.

## 2020-02-02 NOTE — Patient Instructions (Addendum)
  Blood work was ordered.     Medications reviewed and updated.  Changes include :   Stop topamax.  Start gabapentin at night for your headaches.   Your prescription(s) have been submitted to your pharmacy. Please take as directed and contact our office if you believe you are having problem(s) with the medication(s).    Please followup in 6 months

## 2020-02-03 ENCOUNTER — Ambulatory Visit (INDEPENDENT_AMBULATORY_CARE_PROVIDER_SITE_OTHER): Payer: PPO | Admitting: Internal Medicine

## 2020-02-03 ENCOUNTER — Encounter: Payer: Self-pay | Admitting: Internal Medicine

## 2020-02-03 ENCOUNTER — Other Ambulatory Visit: Payer: Self-pay

## 2020-02-03 ENCOUNTER — Other Ambulatory Visit: Payer: Self-pay | Admitting: Internal Medicine

## 2020-02-03 VITALS — BP 140/84 | HR 72 | Temp 97.7°F | Resp 16 | Ht 77.0 in | Wt 237.0 lb

## 2020-02-03 DIAGNOSIS — R519 Headache, unspecified: Secondary | ICD-10-CM

## 2020-02-03 DIAGNOSIS — E7849 Other hyperlipidemia: Secondary | ICD-10-CM

## 2020-02-03 DIAGNOSIS — Z8739 Personal history of other diseases of the musculoskeletal system and connective tissue: Secondary | ICD-10-CM

## 2020-02-03 DIAGNOSIS — R739 Hyperglycemia, unspecified: Secondary | ICD-10-CM

## 2020-02-03 DIAGNOSIS — I10 Essential (primary) hypertension: Secondary | ICD-10-CM

## 2020-02-03 DIAGNOSIS — G8929 Other chronic pain: Secondary | ICD-10-CM | POA: Diagnosis not present

## 2020-02-03 LAB — LIPID PANEL
Cholesterol: 182 mg/dL (ref 0–200)
HDL: 61 mg/dL (ref >39.00)
LDL Cholesterol: 89 mg/dL (ref 0–99)
NonHDL: 121
Total CHOL/HDL Ratio: 3
Triglycerides: 159 mg/dL — ABNORMAL HIGH (ref 0.0–149.0)
VLDL: 31.8 mg/dL (ref 0.0–40.0)

## 2020-02-03 LAB — COMPREHENSIVE METABOLIC PANEL
ALT: 57 U/L — ABNORMAL HIGH (ref 0–53)
AST: 51 U/L — ABNORMAL HIGH (ref 0–37)
Albumin: 4.8 g/dL (ref 3.5–5.2)
Alkaline Phosphatase: 46 U/L (ref 39–117)
BUN: 20 mg/dL (ref 6–23)
CO2: 25 mEq/L (ref 19–32)
Calcium: 10.4 mg/dL (ref 8.4–10.5)
Chloride: 105 mEq/L (ref 96–112)
Creatinine, Ser: 1.21 mg/dL (ref 0.40–1.50)
GFR: 58.93 mL/min — ABNORMAL LOW (ref >60.00)
Glucose, Bld: 104 mg/dL — ABNORMAL HIGH (ref 70–99)
Potassium: 4.1 mEq/L (ref 3.5–5.1)
Sodium: 138 mEq/L (ref 135–145)
Total Bilirubin: 0.7 mg/dL (ref 0.2–1.2)
Total Protein: 7.5 g/dL (ref 6.0–8.3)

## 2020-02-03 LAB — HEMOGLOBIN A1C: Hgb A1c MFr Bld: 5.2 % (ref 4.6–6.5)

## 2020-02-03 MED ORDER — ALLOPURINOL 300 MG PO TABS: 150.0000 mg | ORAL_TABLET | Freq: Every day | ORAL | 1 refills | Status: DC

## 2020-02-03 MED ORDER — GABAPENTIN 300 MG PO CAPS: 300.0000 mg | ORAL_CAPSULE | Freq: Every day | ORAL | 1 refills | Status: DC

## 2020-02-03 MED ORDER — LOSARTAN POTASSIUM 100 MG PO TABS: 100.0000 mg | ORAL_TABLET | Freq: Every day | ORAL | 1 refills | Status: DC

## 2020-02-03 MED ORDER — SIMVASTATIN 40 MG PO TABS: ORAL_TABLET | ORAL | 1 refills | Status: DC

## 2020-02-03 NOTE — Assessment & Plan Note (Signed)
Chronic Check a1c Low sugar / carb diet Stressed regular exercise  

## 2020-02-03 NOTE — Assessment & Plan Note (Signed)
BP Readings from Last 3 Encounters:  02/03/20 140/84  08/05/19 134/78  08/15/18 124/72   Chronic BP well controlled Current regimen effective and well tolerated Continue current medications at current doses cmp

## 2020-02-03 NOTE — Assessment & Plan Note (Addendum)
Chronic Daily headaches Taking topamax bid Taking one tylenol a day and one advil a day He feels his headaches are fairly controlled, but still having daily headaches Headache in right posterior head, intermittent - ? Cervicogenic topamax not working - d/c, start gabapentin 300 mg at night Declined neuro c/s at this time

## 2020-02-03 NOTE — Assessment & Plan Note (Addendum)
Chronic Taking allopurinol daily No gout flares Continue allopurinol

## 2020-02-03 NOTE — Assessment & Plan Note (Signed)
Chronic Check lipid panel  Continue daily statin Regular exercise and healthy diet encouraged  

## 2020-02-05 ENCOUNTER — Encounter: Payer: Self-pay | Admitting: Internal Medicine

## 2020-02-10 ENCOUNTER — Encounter: Payer: Self-pay | Admitting: Internal Medicine

## 2020-03-31 ENCOUNTER — Ambulatory Visit (INDEPENDENT_AMBULATORY_CARE_PROVIDER_SITE_OTHER): Payer: PPO | Admitting: *Deleted

## 2020-03-31 DIAGNOSIS — L814 Other melanin hyperpigmentation: Secondary | ICD-10-CM | POA: Diagnosis not present

## 2020-03-31 DIAGNOSIS — L570 Actinic keratosis: Secondary | ICD-10-CM | POA: Diagnosis not present

## 2020-03-31 DIAGNOSIS — L578 Other skin changes due to chronic exposure to nonionizing radiation: Secondary | ICD-10-CM | POA: Diagnosis not present

## 2020-03-31 DIAGNOSIS — C44629 Squamous cell carcinoma of skin of left upper limb, including shoulder: Secondary | ICD-10-CM | POA: Diagnosis not present

## 2020-03-31 DIAGNOSIS — L821 Other seborrheic keratosis: Secondary | ICD-10-CM | POA: Diagnosis not present

## 2020-03-31 DIAGNOSIS — D225 Melanocytic nevi of trunk: Secondary | ICD-10-CM | POA: Diagnosis not present

## 2020-03-31 DIAGNOSIS — I442 Atrioventricular block, complete: Secondary | ICD-10-CM

## 2020-03-31 DIAGNOSIS — D485 Neoplasm of uncertain behavior of skin: Secondary | ICD-10-CM | POA: Diagnosis not present

## 2020-04-01 ENCOUNTER — Telehealth: Payer: Self-pay

## 2020-04-01 ENCOUNTER — Telehealth: Payer: Self-pay | Admitting: Internal Medicine

## 2020-04-01 ENCOUNTER — Encounter: Payer: Self-pay | Admitting: Internal Medicine

## 2020-04-01 LAB — CUP PACEART REMOTE DEVICE CHECK
Battery Impedance: 1485 Ohm
Battery Remaining Longevity: 44 mo
Battery Voltage: 2.76 V
Brady Statistic AP VP Percent: 5 %
Brady Statistic AP VS Percent: 0 %
Brady Statistic AS VP Percent: 94 %
Brady Statistic AS VS Percent: 1 %
Date Time Interrogation Session: 20210520092743
Implantable Lead Implant Date: 20120302
Implantable Lead Implant Date: 20120302
Implantable Lead Location: 753859
Implantable Lead Location: 753860
Implantable Lead Model: 5076
Implantable Pulse Generator Implant Date: 20120302
Lead Channel Impedance Value: 487 Ohm
Lead Channel Impedance Value: 574 Ohm
Lead Channel Pacing Threshold Amplitude: 0.5 V
Lead Channel Pacing Threshold Amplitude: 0.625 V
Lead Channel Pacing Threshold Pulse Width: 0.4 ms
Lead Channel Pacing Threshold Pulse Width: 0.4 ms
Lead Channel Setting Pacing Amplitude: 2 V
Lead Channel Setting Pacing Amplitude: 2.5 V
Lead Channel Setting Pacing Pulse Width: 0.4 ms
Lead Channel Setting Sensing Sensitivity: 2.8 mV

## 2020-04-01 NOTE — Progress Notes (Signed)
Remote pacemaker transmission.   

## 2020-04-01 NOTE — Telephone Encounter (Signed)
Unable to speak  with patient to remind of missed remote transmission 

## 2020-04-01 NOTE — Telephone Encounter (Signed)
° °  Went to chart to check who called pt. Advise its device team and he missed transmission yesterday. He said will send one today

## 2020-04-15 DIAGNOSIS — D0462 Carcinoma in situ of skin of left upper limb, including shoulder: Secondary | ICD-10-CM | POA: Diagnosis not present

## 2020-04-15 DIAGNOSIS — C44629 Squamous cell carcinoma of skin of left upper limb, including shoulder: Secondary | ICD-10-CM | POA: Diagnosis not present

## 2020-06-09 ENCOUNTER — Ambulatory Visit (INDEPENDENT_AMBULATORY_CARE_PROVIDER_SITE_OTHER): Payer: PPO | Admitting: Internal Medicine

## 2020-06-09 ENCOUNTER — Ambulatory Visit (INDEPENDENT_AMBULATORY_CARE_PROVIDER_SITE_OTHER): Payer: PPO

## 2020-06-09 ENCOUNTER — Encounter: Payer: Self-pay | Admitting: Internal Medicine

## 2020-06-09 ENCOUNTER — Other Ambulatory Visit: Payer: Self-pay

## 2020-06-09 VITALS — BP 124/76 | HR 88 | Temp 98.2°F | Ht 77.0 in | Wt 235.0 lb

## 2020-06-09 DIAGNOSIS — R0989 Other specified symptoms and signs involving the circulatory and respiratory systems: Secondary | ICD-10-CM | POA: Insufficient documentation

## 2020-06-09 DIAGNOSIS — R0781 Pleurodynia: Secondary | ICD-10-CM

## 2020-06-09 NOTE — Assessment & Plan Note (Signed)
Acute Increased healing time RLE with recent wound - now healed Decreased palpable pedal pulses No symptoms claudication H/o of smoking May have some PAD Will refer to vasc surgery for further eval

## 2020-06-09 NOTE — Patient Instructions (Signed)
  Have a chest/rib xray downstairs today.    A referral was ordered for vascular.       Someone from their office will call you to schedule an appointment.

## 2020-06-09 NOTE — Assessment & Plan Note (Signed)
Acute ongoing for months - no obvious injury Likely msk vs radiculopathy Rib/cxr today If pain not explained by above consider sports med referral - may benefit from an adjustment

## 2020-06-09 NOTE — Progress Notes (Signed)
Subjective:    Patient ID: [name], male    DOB: September 02, 1946, 74 y.o.   MRN: [id number]  HPI The patient is here for an acute visit.   He had a small cut on his right lower leg in may and it did not want to heal up.  In June he still had the wound. His sister is an Therapist, sports and mentioned since he smoked and has had phlebitis there may be a circulation issue.  He was concerned about that.    He denies leg muscle pain with walking.  He has hip pain with walking.  Sometimes he gets cramping in his right leg at night.  Sometimes stretching it out helps.  He denies leg swelling.     When laying in bed he gets a sharp pain in his left chest - it feels like he broke a rib.  It is worse with deep breathes.  It is fine during the day.  This has been present for months and concerns him.     He is not exercising regularly.     Medications and allergies reviewed with patient and updated if appropriate.  Patient Active Problem List   Diagnosis Date Noted  . Erectile dysfunction 03/03/2017  . Chronic headaches 02/13/2017  . Snoring 02/15/2015  . Elevated liver enzymes 09/13/2014  . Dupuytren's contracture of both hands 09/02/2013  . History of gout 12/27/2011  . Essential hypertension 01/12/2011  . Complete heart block ([place]) 01/12/2011  . Hyperglycemia 03/02/2009  . HYPERPLASIA PROSTATE UNS W/O UR OBST & OTH LUTS 08/13/2008  . NONSPECIFIC ABNORMAL ELECTROCARDIOGRAM 06/04/2008  . Hyperlipidemia 02/17/2008  . PAROXYSMAL SUPRAVENTRICULAR TACHYCARDIA 02/17/2008  . MITRAL VALVE PROLAPSE 04/17/2007    Current Outpatient Medications on File Prior to Visit  Medication Sig Dispense Refill  . allopurinol (ZYLOPRIM) 300 MG tablet Take 0.5 tablets (150 mg total) by mouth daily. 45 tablet 1  . clobetasol (TEMOVATE) 0.05 % cream Apply 1 application topically daily as needed (itching).     . fish oil-omega-3 fatty acids 1000 MG capsule Take 1 g by mouth 2 (two) times daily.      Marland Kitchen gabapentin  (NEURONTIN) 300 MG capsule Take 1 capsule (300 mg total) by mouth at bedtime. 90 capsule 1  . ibuprofen (ADVIL,MOTRIN) 200 MG tablet Take 200 mg by mouth daily.    Marland Kitchen losartan (COZAAR) 100 MG tablet Take 1 tablet (100 mg total) by mouth daily. 90 tablet 1  . losartan (COZAAR) 100 MG tablet Take 1 tablet (100 mg total) by mouth daily. Need office visit for more refills 90 tablet 1  . sildenafil (REVATIO) 20 MG tablet Take 3-5 pills as needed 50 tablet 5  . simvastatin (ZOCOR) 40 MG tablet TAKE 1 TABLET BY MOUTH EVERYDAY AT BEDTIME 90 tablet 1   No current facility-administered medications on file prior to visit.    Past Medical History:  Diagnosis Date  . Complete heart block (HCC)    S/P  PPM by Dr Rayann Heman  . Coronary artery disease    Non-obstructive. Pt had left heart catheterization in August 2009 showing a 30% mid-circumflex lesion, otherwise coronaries were clean angiographically  . Hyperlipidemia   . Hypertension   . OSA (obstructive sleep apnea)    Probable; study never scheduled    Past Surgical History:  Procedure Laterality Date  . [place], 2009   2009- negative  . COLONOSCOPY  2011   negative, [facility]  . PACEMAKER INSERTION  01/13/11   MDT by Greggory Brandy for complete heart block    Social History   Socioeconomic History  . Marital status: Married    Spouse name: Not on file  . Number of children: Not on file  . Years of education: Not on file  . Highest education level: Not on file  Occupational History  . Not on file  Tobacco Use  . Smoking status: Former Smoker    Quit date: 11/13/2005    Years since quitting: 14.5  . Smokeless tobacco: Never Used  . Tobacco comment: smoked 1965- 2007, up to 2 ppd  Substance and Sexual Activity  . Alcohol use: Yes    Comment:  21 drinks/ week  . Drug use: No  . Sexual activity: Not on file  Other Topics Concern  . Not on file  Social History Narrative   Exercise: regularly - golf and walks - does both a  few times a week   Social Determinants of Health   Financial Resource Strain:   . Difficulty of Paying Living Expenses:   Food Insecurity:   . Worried About Charity fundraiser in the Last Year:   . Arboriculturist in the Last Year:   Transportation Needs:   . Film/video editor (Medical):   Marland Kitchen Lack of Transportation (Non-Medical):   Physical Activity:   . Days of Exercise per Week:   . Minutes of Exercise per Session:   Stress:   . Feeling of Stress :   Social Connections:   . Frequency of Communication with Friends and Family:   . Frequency of Social Gatherings with Friends and Family:   . Attends Religious Services:   . Active Member of Clubs or Organizations:   . Attends Archivist Meetings:   Marland Kitchen Marital Status:     Family History  Problem Relation Age of Onset  . Heart attack Maternal Grandfather 56  . Diabetes Father   . Peripheral vascular disease Mother   . Cancer Mother        ? primary  . Arrhythmia Mother        AFIB  . Gout Brother   . Leukemia Brother   . Other Sister        prediabetic  . Stroke Neg Hx     Review of Systems  Constitutional: Negative for fever.  Respiratory: Negative for cough, shortness of breath and wheezing.   Cardiovascular: Negative for chest pain (left rib pain) and leg swelling.       Objective:   Vitals:   06/09/20 1522  BP: 124/76  Pulse: 88  Temp: 98.2 F (36.8 C)  SpO2: 97%   BP Readings from Last 3 Encounters:  06/09/20 124/76  02/03/20 140/84  08/05/19 134/78   Wt Readings from Last 3 Encounters:  06/09/20 (!) 235 lb (106.6 kg)  02/03/20 237 lb (107.5 kg)  08/05/19 238 lb (108 kg)   Body mass index is 27.87 kg/m.   Physical Exam Constitutional:      General: He is not in acute distress.    Appearance: Normal appearance. He is not ill-appearing.  HENT:     Head: Normocephalic and atraumatic.  Cardiovascular:     Rate and Rhythm: Normal rate and regular rhythm.     Comments: Barely  palpable b/l pedal pulses, feet are warm Pulmonary:     Effort: Pulmonary effort is normal. No respiratory distress.     Breath sounds: Normal breath sounds. No wheezing or  rales.  Musculoskeletal:        General: Tenderness (tenderness with palpation lateral left ribs - posterior to lateral mid ribs, no deformity) present.  Skin:    General: Skin is warm and dry.  Neurological:     Mental Status: He is alert.            Assessment & Plan:    See Problem List for Assessment and Plan of chronic medical problems.    This visit occurred during the SARS-CoV-2 public health emergency.  Safety protocols were in place, including screening questions prior to the visit, additional usage of staff PPE, and extensive cleaning of exam room while observing appropriate contact time as indicated for disinfecting solutions.

## 2020-06-10 ENCOUNTER — Telehealth: Payer: Self-pay | Admitting: Internal Medicine

## 2020-06-10 ENCOUNTER — Encounter: Payer: Self-pay | Admitting: Internal Medicine

## 2020-06-10 DIAGNOSIS — R911 Solitary pulmonary nodule: Secondary | ICD-10-CM

## 2020-06-10 NOTE — Telephone Encounter (Signed)
He has already reviewed the xray from yesterday - there is a possible nodule in his lung, but this may also be a blood vessel.  We need to get a Ct scan to evaluate further. I have ordered this and someone will call him to schedule this at his convenience.

## 2020-06-11 NOTE — Telephone Encounter (Signed)
Spoke with patient today. 

## 2020-06-18 ENCOUNTER — Other Ambulatory Visit: Payer: Self-pay

## 2020-06-18 ENCOUNTER — Ambulatory Visit (INDEPENDENT_AMBULATORY_CARE_PROVIDER_SITE_OTHER)
Admission: RE | Admit: 2020-06-18 | Discharge: 2020-06-18 | Disposition: A | Discharge status: Home / Self Care | Payer: PPO | Source: Ambulatory Visit | Attending: Internal Medicine | Admitting: Internal Medicine

## 2020-06-18 DIAGNOSIS — R911 Solitary pulmonary nodule: Secondary | ICD-10-CM | POA: Diagnosis not present

## 2020-06-18 DIAGNOSIS — I700 Atherosclerosis of aorta: Secondary | ICD-10-CM | POA: Diagnosis not present

## 2020-06-18 DIAGNOSIS — I2510 Atherosclerotic heart disease of native coronary artery without angina pectoris: Secondary | ICD-10-CM | POA: Diagnosis not present

## 2020-06-30 ENCOUNTER — Ambulatory Visit (INDEPENDENT_AMBULATORY_CARE_PROVIDER_SITE_OTHER): Payer: PPO | Admitting: *Deleted

## 2020-06-30 DIAGNOSIS — I442 Atrioventricular block, complete: Secondary | ICD-10-CM

## 2020-06-30 LAB — CUP PACEART REMOTE DEVICE CHECK
Battery Impedance: 1569 Ohm
Battery Remaining Longevity: 43 mo
Battery Voltage: 2.76 V
Brady Statistic AP VP Percent: 5 %
Brady Statistic AP VS Percent: 0 %
Brady Statistic AS VP Percent: 94 %
Brady Statistic AS VS Percent: 1 %
Date Time Interrogation Session: 20210818085254
Implantable Lead Implant Date: 20120302
Implantable Lead Implant Date: 20120302
Implantable Lead Location: 753859
Implantable Lead Location: 753860
Implantable Lead Model: 5076
Implantable Pulse Generator Implant Date: 20120302
Lead Channel Impedance Value: 487 Ohm
Lead Channel Impedance Value: 644 Ohm
Lead Channel Pacing Threshold Amplitude: 0.5 V
Lead Channel Pacing Threshold Amplitude: 0.625 V
Lead Channel Pacing Threshold Pulse Width: 0.4 ms
Lead Channel Pacing Threshold Pulse Width: 0.4 ms
Lead Channel Setting Pacing Amplitude: 2 V
Lead Channel Setting Pacing Amplitude: 2.5 V
Lead Channel Setting Pacing Pulse Width: 0.4 ms
Lead Channel Setting Sensing Sensitivity: 2.8 mV

## 2020-07-01 NOTE — Progress Notes (Signed)
Remote pacemaker transmission.   

## 2020-07-05 ENCOUNTER — Other Ambulatory Visit: Payer: Self-pay | Admitting: Internal Medicine

## 2020-07-14 ENCOUNTER — Encounter: Payer: Self-pay | Admitting: Internal Medicine

## 2020-07-26 ENCOUNTER — Other Ambulatory Visit: Payer: Self-pay

## 2020-07-26 DIAGNOSIS — Z950 Presence of cardiac pacemaker: Secondary | ICD-10-CM | POA: Insufficient documentation

## 2020-07-26 DIAGNOSIS — G90519 Complex regional pain syndrome I of unspecified upper limb: Secondary | ICD-10-CM | POA: Insufficient documentation

## 2020-07-26 DIAGNOSIS — M24549 Contracture, unspecified hand: Secondary | ICD-10-CM | POA: Insufficient documentation

## 2020-07-26 DIAGNOSIS — I739 Peripheral vascular disease, unspecified: Secondary | ICD-10-CM

## 2020-08-02 ENCOUNTER — Encounter: Payer: Self-pay | Admitting: Internal Medicine

## 2020-08-04 DIAGNOSIS — I700 Atherosclerosis of aorta: Secondary | ICD-10-CM | POA: Insufficient documentation

## 2020-08-04 NOTE — Progress Notes (Signed)
Subjective:    Patient ID: [name], male    DOB: [birth year], 74 y.o.   MRN: [id number]  HPI The patient is here for follow up of their chronic medical problems, including htn, hyperlipidemia, hyperglycemia, gout, chronic headaches, knee pain, right hip pain.   Pain in left lower back x 3-4 months.  He notices when he rolls up in bed or coughs.  Better during day.  It radiates down a little.  Has not seen a chiropractor in a while but was tinking about it.  Pain with walking and getting up out of chair. No swelling there.  He put a pain patch on.  No N/T/pain in leg.   Medications and allergies reviewed with patient and updated if appropriate.  Patient Active Problem List   Diagnosis Date Noted   Aortic atherosclerosis ([place]) 08/04/2020   Complex regional pain syndrome type I of the upper limb 07/26/2020   Flexion contracture of joint of hand 07/26/2020   Pacemaker 07/26/2020   Rib pain on left side 06/09/2020   Decreased pulses in feet 06/09/2020   Erectile dysfunction 03/03/2017   Chronic headaches 02/13/2017   Snoring 02/15/2015   Elevated liver enzymes 09/13/2014   Dupuytren's contracture of both hands 09/02/2013   Stiffness of right hand joint 05/07/2013   Skin tear of hand without complication 05/12/1600   History of gout 12/27/2011   Essential hypertension 01/12/2011   Complete heart block ([place]) 01/12/2011   Hyperglycemia 03/02/2009   HYPERPLASIA PROSTATE UNS W/O UR OBST & OTH LUTS 08/13/2008   Hyperlipidemia 02/17/2008   PAROXYSMAL SUPRAVENTRICULAR TACHYCARDIA 02/17/2008    Current Outpatient Medications on File Prior to Visit  Medication Sig Dispense Refill   clobetasol (TEMOVATE) 0.05 % cream Apply 1 application topically daily as needed (itching).      fish oil-omega-3 fatty acids 1000 MG capsule Take 1 g by mouth 2 (two) times daily.       ibuprofen (ADVIL,MOTRIN) 200 MG tablet Take 200 mg by mouth daily.     sildenafil (REVATIO)  20 MG tablet Take 3-5 pills as needed 50 tablet 5   No current facility-administered medications on file prior to visit.    Past Medical History:  Diagnosis Date   Complete heart block (HCC)    S/P  PPM by Dr Rayann Heman   Coronary artery disease    Non-obstructive. Pt had left heart catheterization in August 2009 showing a 30% mid-circumflex lesion, otherwise coronaries were clean angiographically   Hyperlipidemia    Hypertension    OSA (obstructive sleep apnea)    Probable; study never scheduled    Past Surgical History:  Procedure Laterality Date   [place], 2009   2009- negative   COLONOSCOPY  2011   negative, [facility]   PACEMAKER INSERTION  01/13/11   MDT by Greggory Brandy for complete heart block    Social History   Socioeconomic History   Marital status: Married    Spouse name: Not on file   Number of children: Not on file   Years of education: Not on file   Highest education level: Not on file  Occupational History   Not on file  Tobacco Use   Smoking status: Former Smoker    Quit date: 11/13/2005    Years since quitting: 14.7   Smokeless tobacco: Never Used   Tobacco comment: smoked 1965- 2007, up to 2 ppd  Substance and Sexual Activity   Alcohol use: Yes    Comment:  21 drinks/ week   Drug use: No   Sexual activity: Not on file  Other Topics Concern   Not on file  Social History Narrative   Exercise: regularly - golf and walks - does both a few times a week   Social Determinants of Health   Financial Resource Strain:    Difficulty of Paying Living Expenses: Not on file  Food Insecurity:    Worried About Running Out of Food in the Last Year: Not on file   Ran Out of Food in the Last Year: Not on file  Transportation Needs:    Lack of Transportation (Medical): Not on file   Lack of Transportation (Non-Medical): Not on file  Physical Activity:    Days of Exercise per Week: Not on file   Minutes of Exercise per  Session: Not on file  Stress:    Feeling of Stress : Not on file  Social Connections:    Frequency of Communication with Friends and Family: Not on file   Frequency of Social Gatherings with Friends and Family: Not on file   Attends Religious Services: Not on file   Active Member of Clubs or Organizations: Not on file   Attends Archivist Meetings: Not on file   Marital Status: Not on file    Family History  Problem Relation Age of Onset   Heart attack Maternal Grandfather 58   Diabetes Father    Peripheral vascular disease Mother    Cancer Mother        ? primary   Arrhythmia Mother        AFIB   Gout Brother    Leukemia Brother    Other Sister        prediabetic   Stroke Neg Hx     Review of Systems  Constitutional: Negative for chills and fever.  Respiratory: Negative for cough, shortness of breath and wheezing.   Cardiovascular: Positive for leg swelling. Negative for chest pain and palpitations.  Neurological: Positive for headaches (chronic). Negative for dizziness and light-headedness.       Objective:   Vitals:   08/05/20 0810  BP: 132/76  Pulse: 88  Temp: 98 F (36.7 C)  SpO2: 95%   BP Readings from Last 3 Encounters:  08/05/20 132/76  06/09/20 124/76  02/03/20 140/84   Wt Readings from Last 3 Encounters:  08/05/20 235 lb 3.2 oz (106.7 kg)  06/09/20 (!) 235 lb (106.6 kg)  02/03/20 237 lb (107.5 kg)   Body mass index is 27.89 kg/m.   Physical Exam    Constitutional: Appears well-developed and well-nourished. No distress.  HENT:  Head: Normocephalic and atraumatic.  Neck: Neck supple. No tracheal deviation present. No thyromegaly present.  No cervical lymphadenopathy Cardiovascular: Normal rate, regular rhythm and normal heart sounds.   No murmur heard. No carotid bruit .  No edema Pulmonary/Chest: Effort normal and breath sounds normal. No respiratory distress. No has no wheezes. No rales.  Msk: tenderness at L SI  joint.  Skin: Skin is warm and dry. Not diaphoretic.  Psychiatric: Normal mood and affect. Behavior is normal.      Assessment & Plan:    See Problem List for Assessment and Plan of chronic medical problems.    This visit occurred during the SARS-CoV-2 public health emergency.  Safety protocols were in place, including screening questions prior to the visit, additional usage of staff PPE, and extensive cleaning of exam room while observing appropriate contact time as indicated for  disinfecting solutions.

## 2020-08-04 NOTE — Patient Instructions (Addendum)
  Blood work was ordered.     Flu immunization administered today.    Medications reviewed and updated.  Changes include :   none  [facility] chiropractic Address: [address] [place], [place] [address] Phone: [phone]  Or   Dr Minda Meo Sports medicine Downstairs in our building   Please followup in 6 months

## 2020-08-05 ENCOUNTER — Encounter: Payer: Self-pay | Admitting: Internal Medicine

## 2020-08-05 ENCOUNTER — Ambulatory Visit (INDEPENDENT_AMBULATORY_CARE_PROVIDER_SITE_OTHER): Payer: PPO | Admitting: Internal Medicine

## 2020-08-05 ENCOUNTER — Other Ambulatory Visit: Payer: Self-pay

## 2020-08-05 VITALS — BP 132/76 | HR 88 | Temp 98.0°F | Wt 235.2 lb

## 2020-08-05 DIAGNOSIS — R739 Hyperglycemia, unspecified: Secondary | ICD-10-CM | POA: Diagnosis not present

## 2020-08-05 DIAGNOSIS — R519 Headache, unspecified: Secondary | ICD-10-CM

## 2020-08-05 DIAGNOSIS — G8929 Other chronic pain: Secondary | ICD-10-CM | POA: Diagnosis not present

## 2020-08-05 DIAGNOSIS — E7849 Other hyperlipidemia: Secondary | ICD-10-CM

## 2020-08-05 DIAGNOSIS — I10 Essential (primary) hypertension: Secondary | ICD-10-CM

## 2020-08-05 DIAGNOSIS — Z8739 Personal history of other diseases of the musculoskeletal system and connective tissue: Secondary | ICD-10-CM

## 2020-08-05 DIAGNOSIS — Z23 Encounter for immunization: Secondary | ICD-10-CM | POA: Diagnosis not present

## 2020-08-05 DIAGNOSIS — K760 Fatty (change of) liver, not elsewhere classified: Secondary | ICD-10-CM

## 2020-08-05 DIAGNOSIS — R748 Abnormal levels of other serum enzymes: Secondary | ICD-10-CM | POA: Diagnosis not present

## 2020-08-05 DIAGNOSIS — I700 Atherosclerosis of aorta: Secondary | ICD-10-CM | POA: Diagnosis not present

## 2020-08-05 MED ORDER — GABAPENTIN 300 MG PO CAPS
ORAL_CAPSULE | ORAL | 1 refills | Status: DC
Start: 2020-08-05 — End: 2021-01-11

## 2020-08-05 MED ORDER — SIMVASTATIN 40 MG PO TABS
40.0000 mg | ORAL_TABLET | Freq: Every day | ORAL | 1 refills | Status: DC
Start: 2020-08-05 — End: 2021-04-04

## 2020-08-05 MED ORDER — LOSARTAN POTASSIUM 100 MG PO TABS
100.0000 mg | ORAL_TABLET | Freq: Every day | ORAL | 1 refills | Status: DC
Start: 2020-08-05 — End: 2021-04-04

## 2020-08-05 MED ORDER — ALLOPURINOL 300 MG PO TABS
150.0000 mg | ORAL_TABLET | Freq: Every day | ORAL | 1 refills | Status: DC
Start: 2020-08-05 — End: 2021-04-04

## 2020-08-05 NOTE — Assessment & Plan Note (Signed)
Chronic Check lipid panel  Continue daily statin Regular exercise and healthy diet encouraged  

## 2020-08-05 NOTE — Assessment & Plan Note (Signed)
Chronic BP well controlled Current regimen effective and well tolerated Continue current medications at current doses cmp  

## 2020-08-05 NOTE — Assessment & Plan Note (Addendum)
Chronic gabapnetin has helped Headaches are tolerable Taking ibuprofen continue

## 2020-08-05 NOTE — Assessment & Plan Note (Signed)
Chronic Has hepatic steatosis Advised weight loss

## 2020-08-05 NOTE — Assessment & Plan Note (Addendum)
Chronic On simvastatin Check lipids encouraged regular exercise Advised weight loss

## 2020-08-05 NOTE — Assessment & Plan Note (Signed)
Chronic No gout symptoms On allopurinol continue

## 2020-08-05 NOTE — Assessment & Plan Note (Signed)
Chronic Check a1c Low sugar / carb diet Stressed regular exercise  

## 2020-08-05 NOTE — Assessment & Plan Note (Signed)
Chronic Seen on Ct of chest and lfts elevated Advised weight loss

## 2020-08-06 ENCOUNTER — Ambulatory Visit (HOSPITAL_COMMUNITY)
Admission: RE | Admit: 2020-08-06 | Discharge: 2020-08-06 | Disposition: A | Discharge status: Home / Self Care | Payer: PPO | Source: Ambulatory Visit | Attending: Vascular Surgery | Admitting: Vascular Surgery

## 2020-08-06 ENCOUNTER — Ambulatory Visit: Payer: PPO | Admitting: Vascular Surgery

## 2020-08-06 ENCOUNTER — Encounter: Payer: Self-pay | Admitting: Vascular Surgery

## 2020-08-06 VITALS — BP 123/86 | HR 82 | Temp 97.6°F | Resp 20 | Ht 77.0 in | Wt 234.0 lb

## 2020-08-06 DIAGNOSIS — L989 Disorder of the skin and subcutaneous tissue, unspecified: Secondary | ICD-10-CM | POA: Diagnosis not present

## 2020-08-06 DIAGNOSIS — I739 Peripheral vascular disease, unspecified: Secondary | ICD-10-CM | POA: Diagnosis not present

## 2020-08-06 LAB — COMPLETE METABOLIC PANEL WITH GFR
AG Ratio: 1.9 (calc) (ref 1.0–2.5)
ALT: 44 U/L (ref 9–46)
AST: 43 U/L — ABNORMAL HIGH (ref 10–35)
Albumin: 4.8 g/dL (ref 3.6–5.1)
Alkaline phosphatase (APISO): 45 U/L (ref 35–144)
BUN/Creatinine Ratio: 15 (calc) (ref 6–22)
BUN: 20 mg/dL (ref 7–25)
CO2: 27 mmol/L (ref 20–32)
Calcium: 10.2 mg/dL (ref 8.6–10.3)
Chloride: 101 mmol/L (ref 98–110)
Creat: 1.35 mg/dL — ABNORMAL HIGH (ref 0.70–1.18)
GFR, Est African American: 60 mL/min/{1.73_m2} (ref >=60)
GFR, Est Non African American: 52 mL/min/{1.73_m2} — ABNORMAL LOW (ref >=60)
Globulin: 2.5 g/dL (calc) (ref 1.9–3.7)
Glucose, Bld: 94 mg/dL (ref 65–99)
Potassium: 4.5 mmol/L (ref 3.5–5.3)
Sodium: 137 mmol/L (ref 135–146)
Total Bilirubin: 0.7 mg/dL (ref 0.2–1.2)
Total Protein: 7.3 g/dL (ref 6.1–8.1)

## 2020-08-06 LAB — HEMOGLOBIN A1C
Hgb A1c MFr Bld: 5.2 % of total Hgb (ref <5.7)
Mean Plasma Glucose: 103 (calc)
eAG (mmol/L): 5.7 (calc)

## 2020-08-06 LAB — LIPID PANEL
Cholesterol: 176 mg/dL (ref <200)
HDL: 63 mg/dL (ref >=40)
LDL Cholesterol (Calc): 90 mg/dL (calc)
Non-HDL Cholesterol (Calc): 113 mg/dL (calc) (ref <130)
Total CHOL/HDL Ratio: 2.8 (calc) (ref <5.0)
Triglycerides: 124 mg/dL (ref <150)

## 2020-08-06 NOTE — Progress Notes (Signed)
Patient ID: [name], male   DOB: 1946/03/22, 74 y.o.   MRN: [id number]  Reason for Consult: New Patient (Initial Visit)   Referred by Binnie Rail, MD  Subjective:     HPI:  [name] is a 74 y.o. male without significant vascular disease has risk factors hyperlipidemia hypertension.  Also has previous history of coronary artery disease.  No previous lower extremity interventions.  In the past he has had phlebitis of the right lower extremity treated with Coumadin antibiotics when he lived in [place] this was from an injury jumping off of a boat dock.  More recently he had an injury while taking out the trash that was low impact but he states that he does bleed quite a bit taking aspirin daily.  This has been very slow to heal is almost completely healed at this time.  He does not have any swelling of the legs.  He does not have any other ulceration.  No fevers or chills no redness.  He remains very active and golfs frequently.  He spends half of his time here in half in [facility].  Past Medical History:  Diagnosis Date  . Complete heart block (HCC)    S/P  PPM by Dr Rayann Heman  . Coronary artery disease    Non-obstructive. Pt had left heart catheterization in August 2009 showing a 30% mid-circumflex lesion, otherwise coronaries were clean angiographically  . Hyperlipidemia   . Hypertension   . OSA (obstructive sleep apnea)    Probable; study never scheduled   Family History  Problem Relation Age of Onset  . Heart attack Maternal Grandfather 56  . Diabetes Father   . Peripheral vascular disease Mother   . Cancer Mother        ? primary  . Arrhythmia Mother        AFIB  . Gout Brother   . Leukemia Brother   . Other Sister        prediabetic  . Stroke Neg Hx    Past Surgical History:  Procedure Laterality Date  . [place], 2009   2009- negative  . COLONOSCOPY  2011   negative, [facility]  . PACEMAKER INSERTION   01/13/11   MDT by Greggory Brandy for complete heart block    Short Social History:  Social History   Tobacco Use  . Smoking status: Former Smoker    Quit date: 11/13/2005    Years since quitting: 14.7  . Smokeless tobacco: Never Used  . Tobacco comment: smoked 1965- 2007, up to 2 ppd  Substance Use Topics  . Alcohol use: Yes    Comment:  21 drinks/ week    Allergies  Allergen Reactions  . Atorvastatin     Leg cramps  . Ramipril     cough    Current Outpatient Medications  Medication Sig Dispense Refill  . allopurinol (ZYLOPRIM) 300 MG tablet Take 0.5 tablets (150 mg total) by mouth daily. 45 tablet 1  . clobetasol (TEMOVATE) 0.05 % cream Apply 1 application topically daily as needed (itching).     . fish oil-omega-3 fatty acids 1000 MG capsule Take 1 g by mouth 2 (two) times daily.      Marland Kitchen gabapentin (NEURONTIN) 300 MG capsule TAKE 1 CAPSULE BY MOUTH EVERYDAY AT BEDTIME 90 capsule 1  . ibuprofen (ADVIL,MOTRIN) 200 MG tablet Take 200 mg by mouth daily.    Marland Kitchen losartan (COZAAR) 100 MG tablet Take 1 tablet (  100 mg total) by mouth daily. 90 tablet 1  . sildenafil (REVATIO) 20 MG tablet Take 3-5 pills as needed 50 tablet 5  . simvastatin (ZOCOR) 40 MG tablet Take 1 tablet (40 mg total) by mouth daily at 6 PM. 90 tablet 1   No current facility-administered medications for this visit.    Review of Systems  Constitutional:  Constitutional negative. HENT: HENT negative.  Eyes: Eyes negative.  Respiratory: Respiratory negative.  Cardiovascular: Cardiovascular negative.  GI: Gastrointestinal negative.  Musculoskeletal: Musculoskeletal negative.  Skin: Positive for wound.  Neurological: Neurological negative. Psychiatric: Psychiatric negative.        Objective:  Objective   Vitals:   08/06/20 0923  BP: 123/86  Pulse: 82  Resp: 20  Temp: 97.6 F (36.4 C)  SpO2: 97%  Weight: 234 lb (106.1 kg)  Height: 6\' 5"  (1.956 m)   Body mass index is 27.75 kg/m.  Physical Exam HENT:      Head: Normocephalic.     Nose:     Comments: Wearing a mask Eyes:     Pupils: Pupils are equal, round, and reactive to light.  Cardiovascular:     Rate and Rhythm: Normal rate.     Pulses: Normal pulses.  Pulmonary:     Effort: Pulmonary effort is normal.  Abdominal:     General: Abdomen is flat.     Palpations: Abdomen is soft. There is no mass.  Musculoskeletal:        General: No swelling. Normal range of motion.     Cervical back: Normal range of motion and neck supple.  Skin:    General: Skin is warm.     Capillary Refill: Capillary refill takes less than 2 seconds.     Comments: There is a 1 x 1 cm scab on the right lateral leg  Neurological:     Mental Status: He is alert.  Psychiatric:        Mood and Affect: Mood normal.        Behavior: Behavior normal.        Thought Content: Thought content normal.        Judgment: Judgment normal.     Data: I have independently interpreted his ABIs which are greater than one and triphasic bilaterally.     Assessment/Plan:     74 year old male with right lower extremity lateral leg wound that is nearly healed.  ABIs are normal.  Does have a history of phlebitis we will check reflux studies.  He is leaving for [facility] will be back in approximately 6 weeks we can check studies then.  Given the fact that I think they will be normal I have not recommended any compression stockings.     Waynetta Sandy MD [facility]

## 2020-08-10 ENCOUNTER — Other Ambulatory Visit: Payer: Self-pay | Admitting: *Deleted

## 2020-08-10 DIAGNOSIS — I739 Peripheral vascular disease, unspecified: Secondary | ICD-10-CM

## 2020-08-12 DIAGNOSIS — M9905 Segmental and somatic dysfunction of pelvic region: Secondary | ICD-10-CM | POA: Diagnosis not present

## 2020-08-12 DIAGNOSIS — M25552 Pain in left hip: Secondary | ICD-10-CM | POA: Diagnosis not present

## 2020-08-12 DIAGNOSIS — M5136 Other intervertebral disc degeneration, lumbar region: Secondary | ICD-10-CM | POA: Diagnosis not present

## 2020-08-12 DIAGNOSIS — M9903 Segmental and somatic dysfunction of lumbar region: Secondary | ICD-10-CM | POA: Diagnosis not present

## 2020-08-16 DIAGNOSIS — M25552 Pain in left hip: Secondary | ICD-10-CM | POA: Diagnosis not present

## 2020-08-16 DIAGNOSIS — M9903 Segmental and somatic dysfunction of lumbar region: Secondary | ICD-10-CM | POA: Diagnosis not present

## 2020-08-16 DIAGNOSIS — M9905 Segmental and somatic dysfunction of pelvic region: Secondary | ICD-10-CM | POA: Diagnosis not present

## 2020-08-16 DIAGNOSIS — M5136 Other intervertebral disc degeneration, lumbar region: Secondary | ICD-10-CM | POA: Diagnosis not present

## 2020-08-17 DIAGNOSIS — M9905 Segmental and somatic dysfunction of pelvic region: Secondary | ICD-10-CM | POA: Diagnosis not present

## 2020-08-17 DIAGNOSIS — M25552 Pain in left hip: Secondary | ICD-10-CM | POA: Diagnosis not present

## 2020-08-17 DIAGNOSIS — M9903 Segmental and somatic dysfunction of lumbar region: Secondary | ICD-10-CM | POA: Diagnosis not present

## 2020-08-17 DIAGNOSIS — M5136 Other intervertebral disc degeneration, lumbar region: Secondary | ICD-10-CM | POA: Diagnosis not present

## 2020-08-19 DIAGNOSIS — M9903 Segmental and somatic dysfunction of lumbar region: Secondary | ICD-10-CM | POA: Diagnosis not present

## 2020-08-19 DIAGNOSIS — M9905 Segmental and somatic dysfunction of pelvic region: Secondary | ICD-10-CM | POA: Diagnosis not present

## 2020-08-19 DIAGNOSIS — M25552 Pain in left hip: Secondary | ICD-10-CM | POA: Diagnosis not present

## 2020-08-19 DIAGNOSIS — M5136 Other intervertebral disc degeneration, lumbar region: Secondary | ICD-10-CM | POA: Diagnosis not present

## 2020-08-23 DIAGNOSIS — M25552 Pain in left hip: Secondary | ICD-10-CM | POA: Diagnosis not present

## 2020-08-23 DIAGNOSIS — M5136 Other intervertebral disc degeneration, lumbar region: Secondary | ICD-10-CM | POA: Diagnosis not present

## 2020-08-23 DIAGNOSIS — M9905 Segmental and somatic dysfunction of pelvic region: Secondary | ICD-10-CM | POA: Diagnosis not present

## 2020-08-23 DIAGNOSIS — M9903 Segmental and somatic dysfunction of lumbar region: Secondary | ICD-10-CM | POA: Diagnosis not present

## 2020-08-26 DIAGNOSIS — M545 Low back pain, unspecified: Secondary | ICD-10-CM | POA: Diagnosis not present

## 2020-09-02 DIAGNOSIS — M545 Low back pain, unspecified: Secondary | ICD-10-CM | POA: Diagnosis not present

## 2020-09-25 ENCOUNTER — Ambulatory Visit: Payer: PPO | Attending: Internal Medicine

## 2020-09-25 DIAGNOSIS — Z23 Encounter for immunization: Secondary | ICD-10-CM

## 2020-09-25 NOTE — Progress Notes (Signed)
   Covid-19 Vaccination Clinic  Name:  [name]    MRN: [id number] DOB: 1945/12/21  09/25/2020  [name] was observed post Covid-19 immunization for 15 minutes without incident. He was provided with Vaccine Information Sheet and instruction to access the V-Safe system.   [name] was instructed to call 911 with any severe reactions post vaccine: Marland Kitchen Difficulty breathing  . Swelling of face and throat  . A fast heartbeat  . A bad rash all over body  . Dizziness and weakness   Immunizations Administered    Name Date Dose VIS Date Route   Pfizer COVID-19 Vaccine 09/25/2020  1:46 PM 0.3 mL 09/01/2020 Intramuscular   Manufacturer: [employer], [employer]   Lot: [id number]   [place]: [id number]

## 2020-09-29 ENCOUNTER — Ambulatory Visit (INDEPENDENT_AMBULATORY_CARE_PROVIDER_SITE_OTHER): Payer: PPO

## 2020-09-29 ENCOUNTER — Encounter: Payer: Self-pay | Admitting: Internal Medicine

## 2020-09-29 DIAGNOSIS — I442 Atrioventricular block, complete: Secondary | ICD-10-CM

## 2020-09-29 LAB — CUP PACEART REMOTE DEVICE CHECK
Battery Impedance: 1797 Ohm
Battery Remaining Longevity: 38 mo
Battery Voltage: 2.75 V
Brady Statistic AP VP Percent: 5 %
Brady Statistic AP VS Percent: 0 %
Brady Statistic AS VP Percent: 94 %
Brady Statistic AS VS Percent: 1 %
Date Time Interrogation Session: 20211117101814
Implantable Lead Implant Date: 20120302
Implantable Lead Implant Date: 20120302
Implantable Lead Location: 753859
Implantable Lead Location: 753860
Implantable Lead Model: 5076
Implantable Pulse Generator Implant Date: 20120302
Lead Channel Impedance Value: 474 Ohm
Lead Channel Impedance Value: 654 Ohm
Lead Channel Pacing Threshold Amplitude: 0.5 V
Lead Channel Pacing Threshold Amplitude: 0.625 V
Lead Channel Pacing Threshold Pulse Width: 0.4 ms
Lead Channel Pacing Threshold Pulse Width: 0.4 ms
Lead Channel Setting Pacing Amplitude: 2 V
Lead Channel Setting Pacing Amplitude: 2.5 V
Lead Channel Setting Pacing Pulse Width: 0.4 ms
Lead Channel Setting Sensing Sensitivity: 2.8 mV

## 2020-09-30 DIAGNOSIS — L820 Inflamed seborrheic keratosis: Secondary | ICD-10-CM | POA: Diagnosis not present

## 2020-09-30 DIAGNOSIS — L570 Actinic keratosis: Secondary | ICD-10-CM | POA: Diagnosis not present

## 2020-09-30 DIAGNOSIS — T8189XA Other complications of procedures, not elsewhere classified, initial encounter: Secondary | ICD-10-CM | POA: Diagnosis not present

## 2020-09-30 NOTE — Progress Notes (Signed)
Remote pacemaker transmission.   

## 2020-10-01 ENCOUNTER — Ambulatory Visit: Payer: PPO

## 2020-10-01 ENCOUNTER — Other Ambulatory Visit: Payer: Self-pay

## 2020-10-01 ENCOUNTER — Ambulatory Visit: Payer: PPO | Admitting: Vascular Surgery

## 2020-10-01 ENCOUNTER — Ambulatory Visit (HOSPITAL_COMMUNITY)
Admission: RE | Admit: 2020-10-01 | Discharge: 2020-10-01 | Disposition: A | Discharge status: Home / Self Care | Payer: PPO | Source: Ambulatory Visit | Attending: Vascular Surgery | Admitting: Vascular Surgery

## 2020-10-01 ENCOUNTER — Encounter: Payer: Self-pay | Admitting: Vascular Surgery

## 2020-10-01 VITALS — BP 134/78 | HR 75 | Temp 98.3°F | Resp 20 | Ht 77.0 in | Wt 241.0 lb

## 2020-10-01 DIAGNOSIS — I739 Peripheral vascular disease, unspecified: Secondary | ICD-10-CM | POA: Diagnosis not present

## 2020-10-01 DIAGNOSIS — L989 Disorder of the skin and subcutaneous tissue, unspecified: Secondary | ICD-10-CM | POA: Diagnosis not present

## 2020-10-01 NOTE — Progress Notes (Signed)
Patient ID: [name], male   DOB: [birth year], 74 y.o.   MRN: [id number]  Reason for Consult: Follow-up   Referred by Binnie Rail, MD  Subjective:     HPI:  [name] is a 74 y.o. male history of a right leg wound.  Risk factors for vascular disease include coronary artery disease.  Previously did have phlebitis of his right lower extremity and had a small injury from dropping off a boat dock which is failed to heal.  He takes aspirin daily.  He states the wound is somewhat improved but not fully healed at this time.  He does have some dry skin of his bilateral lower extremities.  He remains very active.  He has no fevers or chills no redness of the incision.  He is here today for evaluation of his lower extremity veins.  Past Medical History:  Diagnosis Date  . Complete heart block (HCC)    S/P  PPM by Dr Rayann Heman  . Coronary artery disease    Non-obstructive. Pt had left heart catheterization in August 2009 showing a 30% mid-circumflex lesion, otherwise coronaries were clean angiographically  . Hyperlipidemia   . Hypertension   . OSA (obstructive sleep apnea)    Probable; study never scheduled   Family History  Problem Relation Age of Onset  . Heart attack Maternal Grandfather 56  . Diabetes Father   . Peripheral vascular disease Mother   . Cancer Mother        ? primary  . Arrhythmia Mother        AFIB  . Gout Brother   . Leukemia Brother   . Other Sister        prediabetic  . Stroke Neg Hx    Past Surgical History:  Procedure Laterality Date  . [place], 2009   2009- negative  . COLONOSCOPY  2011   negative, [facility]  . PACEMAKER INSERTION  01/13/11   MDT by Greggory Brandy for complete heart block    Short Social History:  Social History   Tobacco Use  . Smoking status: Former Smoker    Quit date: 11/13/2005    Years since quitting: 14.8  . Smokeless tobacco: Never Used  . Tobacco comment: smoked 1965- 2007, up to 2 ppd  Substance Use  Topics  . Alcohol use: Yes    Comment:  21 drinks/ week    Allergies  Allergen Reactions  . Atorvastatin     Leg cramps  . Ramipril     cough    Current Outpatient Medications  Medication Sig Dispense Refill  . allopurinol (ZYLOPRIM) 300 MG tablet Take 0.5 tablets (150 mg total) by mouth daily. 45 tablet 1  . clobetasol (TEMOVATE) 0.05 % cream Apply 1 application topically daily as needed (itching).     . fish oil-omega-3 fatty acids 1000 MG capsule Take 1 g by mouth 2 (two) times daily.      Marland Kitchen gabapentin (NEURONTIN) 300 MG capsule TAKE 1 CAPSULE BY MOUTH EVERYDAY AT BEDTIME 90 capsule 1  . ibuprofen (ADVIL,MOTRIN) 200 MG tablet Take 200 mg by mouth daily.    Marland Kitchen losartan (COZAAR) 100 MG tablet Take 1 tablet (100 mg total) by mouth daily. 90 tablet 1  . sildenafil (REVATIO) 20 MG tablet Take 3-5 pills as needed 50 tablet 5  . simvastatin (ZOCOR) 40 MG tablet Take 1 tablet (40 mg total) by mouth daily at 6 PM. 90 tablet 1   No current  facility-administered medications for this visit.    Review of Systems  Constitutional:  Constitutional negative. HENT: HENT negative.  Eyes: Eyes negative.  Respiratory: Respiratory negative.  Cardiovascular: Cardiovascular negative.  GI: Gastrointestinal negative.  Musculoskeletal: Musculoskeletal negative.  Skin: Positive for wound.  Neurological: Neurological negative. Hematologic: Hematologic/lymphatic negative.  Psychiatric: Psychiatric negative.        Objective:  Objective   Vitals:   10/01/20 1435  BP: 134/78  Pulse: 75  Resp: 20  Temp: 98.3 F (36.8 C)  SpO2: 96%  Weight: 241 lb (109.3 kg)  Height: 6\' 5"  (1.956 m)   Body mass index is 28.58 kg/m.  Physical Exam HENT:     Head: Normocephalic.     Nose:     Comments: Wearing a mask Eyes:     Pupils: Pupils are equal, round, and reactive to light.  Cardiovascular:     Rate and Rhythm: Normal rate.     Pulses: Normal pulses.  Pulmonary:     Effort: Pulmonary  effort is normal.  Abdominal:     General: Abdomen is flat.     Palpations: Abdomen is soft.  Musculoskeletal:        General: Normal range of motion.     Cervical back: Neck supple.  Skin:    General: Skin is dry.     Capillary Refill: Capillary refill takes less than 2 seconds.     Comments: He does have some dry skin of his bilateral lower extremities, small wound on the lateral aspect of the right leg  Neurological:     General: No focal deficit present.     Mental Status: He is alert.  Psychiatric:        Mood and Affect: Mood normal.        Behavior: Behavior normal.        Thought Content: Thought content normal.        Judgment: Judgment normal.     Data: Right:  - No evidence of deep vein thrombosis seen in the right lower extremity,  from the common femoral through the popliteal veins.  - No evidence of superficial venous thrombosis in the right lower  extremity.  - Venous reflux is noted in the right sapheno-femoral junction.  - Venous reflux is noted in the right greater saphenous vein in the calf.    Left:  - No evidence of deep vein thrombosis seen in the left lower extremity,  from the common femoral through the popliteal veins.  - No evidence of superficial venous thrombosis in the left lower  extremity.  - Venous reflux is noted in the left short saphenous vein.      Assessment/Plan:     74 year old male with slow to heal right lateral leg wound.  Pulses are normal has very minimal venous reflux.  I have recommended continue moisturizing of his legs given that he does have some dry skin.  He does not require any vascular invention and can follow-up with me on an as-needed basis.     Waynetta Sandy MD [facility] [place]

## 2020-11-11 DIAGNOSIS — M545 Low back pain, unspecified: Secondary | ICD-10-CM | POA: Diagnosis not present

## 2020-12-28 DIAGNOSIS — M545 Low back pain, unspecified: Secondary | ICD-10-CM | POA: Diagnosis not present

## 2020-12-29 ENCOUNTER — Ambulatory Visit (INDEPENDENT_AMBULATORY_CARE_PROVIDER_SITE_OTHER): Payer: PPO

## 2020-12-29 DIAGNOSIS — I442 Atrioventricular block, complete: Secondary | ICD-10-CM | POA: Diagnosis not present

## 2020-12-29 LAB — CUP PACEART REMOTE DEVICE CHECK
Battery Impedance: 1825 Ohm
Battery Remaining Longevity: 38 mo
Battery Voltage: 2.75 V
Brady Statistic AP VP Percent: 5 %
Brady Statistic AP VS Percent: 0 %
Brady Statistic AS VP Percent: 93 %
Brady Statistic AS VS Percent: 2 %
Date Time Interrogation Session: 20220216110122
Implantable Lead Implant Date: 20120302
Implantable Lead Implant Date: 20120302
Implantable Lead Location: 753859
Implantable Lead Location: 753860
Implantable Lead Model: 5076
Implantable Pulse Generator Implant Date: 20120302
Lead Channel Impedance Value: 481 Ohm
Lead Channel Impedance Value: 671 Ohm
Lead Channel Pacing Threshold Amplitude: 0.5 V
Lead Channel Pacing Threshold Amplitude: 0.625 V
Lead Channel Pacing Threshold Pulse Width: 0.4 ms
Lead Channel Pacing Threshold Pulse Width: 0.4 ms
Lead Channel Setting Pacing Amplitude: 2 V
Lead Channel Setting Pacing Amplitude: 2.5 V
Lead Channel Setting Pacing Pulse Width: 0.4 ms
Lead Channel Setting Sensing Sensitivity: 4 mV

## 2021-01-04 NOTE — Progress Notes (Signed)
Remote pacemaker transmission.   

## 2021-01-10 ENCOUNTER — Other Ambulatory Visit: Payer: Self-pay | Admitting: Internal Medicine

## 2021-02-01 NOTE — Patient Instructions (Addendum)
Blood work was ordered.     Medications changes include : none    Your prescription(s) have been submitted to your pharmacy. Please take as directed and contact our office if you believe you are having problem(s) with the medication(s).   A referral was ordered for GI.        Someone from their office will call you to schedule an appointment.    Please followup in 6 months     Health Maintenance, Male Adopting a healthy lifestyle and getting preventive care are important in promoting health and wellness. Ask your health care provider about:  The right schedule for you to have regular tests and exams.  Things you can do on your own to prevent diseases and keep yourself healthy. What should I know about diet, weight, and exercise? Eat a healthy diet  Eat a diet that includes plenty of vegetables, fruits, low-fat dairy products, and lean protein.  Do not eat a lot of foods that are high in solid fats, added sugars, or sodium.   Maintain a healthy weight Body mass index (BMI) is a measurement that can be used to identify possible weight problems. It estimates body fat based on height and weight. Your health care provider can help determine your BMI and help you achieve or maintain a healthy weight. Get regular exercise Get regular exercise. This is one of the most important things you can do for your health. Most adults should:  Exercise for at least 150 minutes each week. The exercise should increase your heart rate and make you sweat (moderate-intensity exercise).  Do strengthening exercises at least twice a week. This is in addition to the moderate-intensity exercise.  Spend less time sitting. Even light physical activity can be beneficial. Watch cholesterol and blood lipids Have your blood tested for lipids and cholesterol at 73 years of age, then have this test every 5 years. You may need to have your cholesterol levels checked more often if:  Your lipid or cholesterol  levels are high.  You are older than 73 years of age.  You are at high risk for heart disease. What should I know about cancer screening? Many types of cancers can be detected early and may often be prevented. Depending on your health history and family history, you may need to have cancer screening at various ages. This may include screening for:  Colorectal cancer.  Prostate cancer.  Skin cancer.  Lung cancer. What should I know about heart disease, diabetes, and high blood pressure? Blood pressure and heart disease  High blood pressure causes heart disease and increases the risk of stroke. This is more likely to develop in people who have high blood pressure readings, are of African descent, or are overweight.  Talk with your health care provider about your target blood pressure readings.  Have your blood pressure checked: ? Every 3-5 years if you are 32-19 years of age. ? Every year if you are 60 years old or older.  If you are between the ages of 52 and 34 and are a current or former smoker, ask your health care provider if you should have a one-time screening for abdominal aortic aneurysm (AAA). Diabetes Have regular diabetes screenings. This checks your fasting blood sugar level. Have the screening done:  Once every three years after age 74 if you are at a normal weight and have a low risk for diabetes.  More often and at a younger age if you are overweight or have a  high risk for diabetes. What should I know about preventing infection? Hepatitis B If you have a higher risk for hepatitis B, you should be screened for this virus. Talk with your health care provider to find out if you are at risk for hepatitis B infection. Hepatitis C Blood testing is recommended for:  Everyone born from [birth year] through 1965.  Anyone with known risk factors for hepatitis C. Sexually transmitted infections (STIs)  You should be screened each year for STIs, including gonorrhea and  chlamydia, if: ? You are sexually active and are younger than 73 years of age. ? You are older than 73 years of age and your health care provider tells you that you are at risk for this type of infection. ? Your sexual activity has changed since you were last screened, and you are at increased risk for chlamydia or gonorrhea. Ask your health care provider if you are at risk.  Ask your health care provider about whether you are at high risk for HIV. Your health care provider may recommend a prescription medicine to help prevent HIV infection. If you choose to take medicine to prevent HIV, you should first get tested for HIV. You should then be tested every 3 months for as long as you are taking the medicine. Follow these instructions at home: Lifestyle  Do not use any products that contain nicotine or tobacco, such as cigarettes, e-cigarettes, and chewing tobacco. If you need help quitting, ask your health care provider.  Do not use street drugs.  Do not share needles.  Ask your health care provider for help if you need support or information about quitting drugs. Alcohol use  Do not drink alcohol if your health care provider tells you not to drink.  If you drink alcohol: ? Limit how much you have to 0-2 drinks a day. ? Be aware of how much alcohol is in your drink. In the [place] one drink equals one 12 oz bottle of beer (355 mL), one 5 oz glass of wine (148 mL), or one 1 oz glass of hard liquor (44 mL). General instructions  Schedule regular health, dental, and eye exams.  Stay current with your vaccines.  Tell your health care provider if: ? You often feel depressed. ? You have ever been abused or do not feel safe at home. Summary  Adopting a healthy lifestyle and getting preventive care are important in promoting health and wellness.  Follow your health care provider's instructions about healthy diet, exercising, and getting tested or screened for diseases.  Follow your health  care provider's instructions on monitoring your cholesterol and blood pressure. This information is not intended to replace advice given to you by your health care provider. Make sure you discuss any questions you have with your health care provider. Document Revised: 10/23/2018 Document Reviewed: 10/23/2018 Elsevier Patient Education  2021 [employer].

## 2021-02-01 NOTE — Progress Notes (Unsigned)
Subjective:    Patient ID: [name], male    DOB: Oct 24, 1946, 75 y.o.   MRN: [id number]  HPI He is here for a physical exam.   In morning has significant R lower back pain at SI joint and has radiation down R leg.  After he ices the back he is ok the rest of the day.  He is currently seeing a chiropractor and that helps.   Medications and allergies reviewed with patient and updated if appropriate.  Patient Active Problem List   Diagnosis Date Noted  . Hepatic steatosis 08/05/2020  . Aortic atherosclerosis ([place]) 08/04/2020  . Complex regional pain syndrome type I of the upper limb 07/26/2020  . Flexion contracture of joint of hand 07/26/2020  . Pacemaker 07/26/2020  . Rib pain on left side 06/09/2020  . Decreased pulses in feet 06/09/2020  . Erectile dysfunction 03/03/2017  . Chronic headaches 02/13/2017  . Snoring 02/15/2015  . Elevated liver enzymes 09/13/2014  . Dupuytren's contracture of both hands 09/02/2013  . Stiffness of right hand joint 05/07/2013  . Skin tear of hand without complication 65/78/4696  . History of gout 12/27/2011  . Essential hypertension 01/12/2011  . Complete heart block ([place]) 01/12/2011  . Hyperglycemia 03/02/2009  . HYPERPLASIA PROSTATE UNS W/O UR OBST & OTH LUTS 08/13/2008  . Hyperlipidemia 02/17/2008  . PAROXYSMAL SUPRAVENTRICULAR TACHYCARDIA 02/17/2008    Current Outpatient Medications on File Prior to Visit  Medication Sig Dispense Refill  . allopurinol (ZYLOPRIM) 300 MG tablet Take 0.5 tablets (150 mg total) by mouth daily. 45 tablet 1  . clobetasol (TEMOVATE) 0.05 % cream Apply 1 application topically daily as needed (itching).    . fish oil-omega-3 fatty acids 1000 MG capsule Take 1 g by mouth 2 (two) times daily.    . fluorouracil (EFUDEX) 5 % cream Apply 1 application topically daily.    Marland Kitchen gabapentin (NEURONTIN) 300 MG capsule TAKE 1 CAPSULE BY MOUTH EVERYDAY AT BEDTIME 90 capsule 1  . ibuprofen (ADVIL,MOTRIN) 200 MG tablet  Take 200 mg by mouth daily.    Marland Kitchen losartan (COZAAR) 100 MG tablet Take 1 tablet (100 mg total) by mouth daily. 90 tablet 1  . sildenafil (REVATIO) 20 MG tablet Take 3-5 pills as needed 50 tablet 5  . simvastatin (ZOCOR) 40 MG tablet Take 1 tablet (40 mg total) by mouth daily at 6 PM. 90 tablet 1   No current facility-administered medications on file prior to visit.    Past Medical History:  Diagnosis Date  . Complete heart block (HCC)    S/P  PPM by Dr Rayann Heman  . Coronary artery disease    Non-obstructive. Pt had left heart catheterization in August 2009 showing a 30% mid-circumflex lesion, otherwise coronaries were clean angiographically  . Hyperlipidemia   . Hypertension   . OSA (obstructive sleep apnea)    Probable; study never scheduled    Past Surgical History:  Procedure Laterality Date  . [place], 2009   2009- negative  . COLONOSCOPY  2011   negative,  GI  . PACEMAKER INSERTION  01/13/11   MDT by Greggory Brandy for complete heart block    Social History   Socioeconomic History  . Marital status: Married    Spouse name: Not on file  . Number of children: Not on file  . Years of education: Not on file  . Highest education level: Not on file  Occupational History  . Not on file  Tobacco Use  .  Smoking status: Former Smoker    Quit date: 11/13/2005    Years since quitting: 15.2  . Smokeless tobacco: Never Used  . Tobacco comment: smoked 1965- 2007, up to 2 ppd  Vaping Use  . Vaping Use: Never used  Substance and Sexual Activity  . Alcohol use: Yes    Comment:  21 drinks/ week  . Drug use: No  . Sexual activity: Not on file  Other Topics Concern  . Not on file  Social History Narrative   Exercise: regularly - golf and walks - does both a few times a week   Social Determinants of Health   Financial Resource Strain: Not on file  Food Insecurity: Not on file  Transportation Needs: Not on file  Physical Activity: Not on file  Stress: Not on  file  Social Connections: Not on file    Family History  Problem Relation Age of Onset  . Heart attack Maternal Grandfather 56  . Diabetes Father   . Peripheral vascular disease Mother   . Cancer Mother        ? primary  . Arrhythmia Mother        AFIB  . Gout Brother   . Leukemia Brother   . Other Sister        prediabetic  . Stroke Neg Hx     Review of Systems  Constitutional: Negative for chills and fever.  HENT: Negative for postnasal drip.   Eyes: Negative for visual disturbance.  Respiratory: Positive for cough (occ). Negative for shortness of breath and wheezing.   Cardiovascular: Negative for chest pain, palpitations and leg swelling.  Gastrointestinal: Positive for constipation. Negative for abdominal pain, blood in stool, diarrhea and nausea.       No gerd  Genitourinary: Negative for difficulty urinating and dysuria.  Musculoskeletal: Positive for arthralgias (hip, occ knees) and back pain.  Skin: Negative for color change and rash.  Neurological: Positive for headaches (controlled). Negative for dizziness and light-headedness.  Hematological: Bruises/bleeds easily (bruises eary).  Psychiatric/Behavioral: Negative for dysphoric mood. The patient is not nervous/anxious.        Objective:   Vitals:   02/02/21 0821  BP: 138/70  Pulse: (!) 46  Temp: 98.1 F (36.7 C)  SpO2: 97%   Filed Weights   02/02/21 0821  Weight: 240 lb (108.9 kg)   Body mass index is 28.46 kg/m.  BP Readings from Last 3 Encounters:  02/02/21 138/70  10/01/20 134/78  08/06/20 123/86    Wt Readings from Last 3 Encounters:  02/02/21 240 lb (108.9 kg)  10/01/20 241 lb (109.3 kg)  08/06/20 234 lb (106.1 kg)     Physical Exam Constitutional: He appears well-developed and well-nourished. No distress.  HENT:  Head: Normocephalic and atraumatic.  Right Ear: External ear normal.  Left Ear: External ear normal.  Mouth/Throat: Oropharynx is clear and moist.  Normal ear canals  and TM b/l  Eyes: Conjunctivae and EOM are normal.  Neck: Neck supple. No tracheal deviation present. No thyromegaly present.  No carotid bruit  Cardiovascular: Normal rate, regular rhythm, normal heart sounds and intact distal pulses.   No murmur heard. Pulmonary/Chest: Effort normal and breath sounds normal. No respiratory distress. He has no wheezes. He has no rales.  Abdominal: Soft. Reducible umbilical hernia - nontender, He exhibits no distension. There is no tenderness.  Genitourinary: deferred  Musculoskeletal: He exhibits no edema.  Lymphadenopathy:   He has no cervical adenopathy.  Skin: Skin is warm and dry.  He is not diaphoretic.  Psychiatric: He has a normal mood and affect. His behavior is normal.         Assessment & Plan:   Physical exam: Screening blood work  ordered Immunizations  Up to date Colonoscopy   - due -- referred ordered Eye exams   Up to date Exercise   None - joint pain Weight  Ok for age Substance abuse   none Sees dermatology 2/ year    See Problem List for Assessment and Plan of chronic medical problems.   This visit occurred during the SARS-CoV-2 public health emergency.  Safety protocols were in place, including screening questions prior to the visit, additional usage of staff PPE, and extensive cleaning of exam room while observing appropriate contact time as indicated for disinfecting solutions.

## 2021-02-02 ENCOUNTER — Encounter: Payer: Self-pay | Admitting: Internal Medicine

## 2021-02-02 ENCOUNTER — Ambulatory Visit (INDEPENDENT_AMBULATORY_CARE_PROVIDER_SITE_OTHER): Payer: PPO | Admitting: Internal Medicine

## 2021-02-02 ENCOUNTER — Other Ambulatory Visit: Payer: Self-pay

## 2021-02-02 VITALS — BP 138/70 | HR 46 | Temp 98.1°F | Ht 77.0 in | Wt 240.0 lb

## 2021-02-02 DIAGNOSIS — Z0001 Encounter for general adult medical examination with abnormal findings: Secondary | ICD-10-CM | POA: Diagnosis not present

## 2021-02-02 DIAGNOSIS — R519 Headache, unspecified: Secondary | ICD-10-CM | POA: Diagnosis not present

## 2021-02-02 DIAGNOSIS — I700 Atherosclerosis of aorta: Secondary | ICD-10-CM | POA: Diagnosis not present

## 2021-02-02 DIAGNOSIS — I10 Essential (primary) hypertension: Secondary | ICD-10-CM

## 2021-02-02 DIAGNOSIS — E7849 Other hyperlipidemia: Secondary | ICD-10-CM | POA: Diagnosis not present

## 2021-02-02 DIAGNOSIS — N529 Male erectile dysfunction, unspecified: Secondary | ICD-10-CM | POA: Diagnosis not present

## 2021-02-02 DIAGNOSIS — K429 Umbilical hernia without obstruction or gangrene: Secondary | ICD-10-CM

## 2021-02-02 DIAGNOSIS — Z1211 Encounter for screening for malignant neoplasm of colon: Secondary | ICD-10-CM | POA: Diagnosis not present

## 2021-02-02 DIAGNOSIS — Z0000 Encounter for general adult medical examination without abnormal findings: Secondary | ICD-10-CM

## 2021-02-02 DIAGNOSIS — R739 Hyperglycemia, unspecified: Secondary | ICD-10-CM | POA: Diagnosis not present

## 2021-02-02 DIAGNOSIS — Z8739 Personal history of other diseases of the musculoskeletal system and connective tissue: Secondary | ICD-10-CM

## 2021-02-02 DIAGNOSIS — G8929 Other chronic pain: Secondary | ICD-10-CM | POA: Diagnosis not present

## 2021-02-02 LAB — LIPID PANEL
Cholesterol: 185 mg/dL (ref 0–200)
HDL: 60.4 mg/dL (ref >39.00)
LDL Cholesterol: 101 mg/dL — ABNORMAL HIGH (ref 0–99)
NonHDL: 124.29
Total CHOL/HDL Ratio: 3
Triglycerides: 117 mg/dL (ref 0.0–149.0)
VLDL: 23.4 mg/dL (ref 0.0–40.0)

## 2021-02-02 LAB — CBC WITH DIFFERENTIAL/PLATELET
Basophils Absolute: 0 10*3/uL (ref 0.0–0.1)
Basophils Relative: 0.8 % (ref 0.0–3.0)
Eosinophils Absolute: 0.3 10*3/uL (ref 0.0–0.7)
Eosinophils Relative: 6 % — ABNORMAL HIGH (ref 0.0–5.0)
HCT: 45 % (ref 39.0–52.0)
Hemoglobin: 15.4 g/dL (ref 13.0–17.0)
Lymphocytes Relative: 41.7 % (ref 12.0–46.0)
Lymphs Abs: 2.4 10*3/uL (ref 0.7–4.0)
MCHC: 34.3 g/dL (ref 30.0–36.0)
MCV: 102.4 fl — ABNORMAL HIGH (ref 78.0–100.0)
Monocytes Absolute: 0.5 10*3/uL (ref 0.1–1.0)
Monocytes Relative: 9 % (ref 3.0–12.0)
Neutro Abs: 2.4 10*3/uL (ref 1.4–7.7)
Neutrophils Relative %: 42.5 % — ABNORMAL LOW (ref 43.0–77.0)
Platelets: 150 10*3/uL (ref 150.0–400.0)
RBC: 4.39 Mil/uL (ref 4.22–5.81)
RDW: 13.8 % (ref 11.5–15.5)
WBC: 5.6 10*3/uL (ref 4.0–10.5)

## 2021-02-02 LAB — COMPREHENSIVE METABOLIC PANEL
ALT: 49 U/L (ref 0–53)
AST: 45 U/L — ABNORMAL HIGH (ref 0–37)
Albumin: 4.7 g/dL (ref 3.5–5.2)
Alkaline Phosphatase: 40 U/L (ref 39–117)
BUN: 18 mg/dL (ref 6–23)
CO2: 28 mEq/L (ref 19–32)
Calcium: 10.6 mg/dL — ABNORMAL HIGH (ref 8.4–10.5)
Chloride: 101 mEq/L (ref 96–112)
Creatinine, Ser: 1.19 mg/dL (ref 0.40–1.50)
GFR: 60.78 mL/min (ref >60.00)
Glucose, Bld: 113 mg/dL — ABNORMAL HIGH (ref 70–99)
Potassium: 4.4 mEq/L (ref 3.5–5.1)
Sodium: 139 mEq/L (ref 135–145)
Total Bilirubin: 0.7 mg/dL (ref 0.2–1.2)
Total Protein: 7.2 g/dL (ref 6.0–8.3)

## 2021-02-02 LAB — TSH: TSH: 3.78 u[IU]/mL (ref 0.35–4.50)

## 2021-02-02 LAB — URIC ACID: Uric Acid, Serum: 5.2 mg/dL (ref 4.0–7.8)

## 2021-02-02 NOTE — Assessment & Plan Note (Signed)
Chronic BP well controlled Continue losartan 100 mg daily cmp  

## 2021-02-02 NOTE — Assessment & Plan Note (Signed)
Chronic Headaches overall controlled Continue gabapentin 300 mg daily at bedtime and ibuprofen daily

## 2021-02-02 NOTE — Assessment & Plan Note (Signed)
Asymptomatic. monitor

## 2021-02-02 NOTE — Assessment & Plan Note (Signed)
Chronic Continue simvastatin 40 mg daily Not exercising secondary to joint pain Encouraged healthy diet

## 2021-02-02 NOTE — Assessment & Plan Note (Signed)
Chronic Check lipid panel  Continue simvastatin 40 mg daily Regular exercise and healthy diet encouraged  

## 2021-02-02 NOTE — Assessment & Plan Note (Signed)
Chronic No gout symptoms since his last visit Check uric acid level Continue allopurinol 150 mg daily

## 2021-02-02 NOTE — Assessment & Plan Note (Signed)
Chronic A1c

## 2021-02-02 NOTE — Assessment & Plan Note (Signed)
Chronic.  Continue sildenafil as needed.

## 2021-02-23 ENCOUNTER — Encounter: Payer: Self-pay | Admitting: Internal Medicine

## 2021-03-23 ENCOUNTER — Ambulatory Visit: Payer: PPO | Attending: Internal Medicine

## 2021-03-23 DIAGNOSIS — Z23 Encounter for immunization: Secondary | ICD-10-CM

## 2021-03-23 NOTE — Progress Notes (Signed)
   Covid-19 Vaccination Clinic  Name:  [name]    MRN: [id number] DOB: 1945/12/06  03/23/2021  [name] was observed post Covid-19 immunization for 15 minutes without incident. He was provided with Vaccine Information Sheet and instruction to access the V-Safe system.   [name] was instructed to call 911 with any severe reactions post vaccine: Marland Kitchen Difficulty breathing  . Swelling of face and throat  . A fast heartbeat  . A bad rash all over body  . Dizziness and weakness   Immunizations Administered    Name Date Dose VIS Date Route   PFIZER Comrnaty(Gray TOP) Covid-19 Vaccine 03/23/2021 12:40 PM 0.3 mL 10/21/2020 Intramuscular   Manufacturer: [employer]   Lot: [id number]   NDC: [phone]

## 2021-03-28 ENCOUNTER — Other Ambulatory Visit (HOSPITAL_BASED_OUTPATIENT_CLINIC_OR_DEPARTMENT_OTHER): Payer: Self-pay

## 2021-03-28 MED ORDER — PFIZER-BIONT COVID-19 VAC-TRIS 30 MCG/0.3ML IM SUSP
INTRAMUSCULAR | 0 refills | Status: DC
  Filled 2021-03-28: qty 0.3, 1d supply, fill #0

## 2021-03-30 ENCOUNTER — Ambulatory Visit (INDEPENDENT_AMBULATORY_CARE_PROVIDER_SITE_OTHER): Payer: PPO

## 2021-03-30 DIAGNOSIS — I442 Atrioventricular block, complete: Secondary | ICD-10-CM | POA: Diagnosis not present

## 2021-03-31 DIAGNOSIS — D225 Melanocytic nevi of trunk: Secondary | ICD-10-CM | POA: Diagnosis not present

## 2021-03-31 DIAGNOSIS — L821 Other seborrheic keratosis: Secondary | ICD-10-CM | POA: Diagnosis not present

## 2021-03-31 DIAGNOSIS — Z85828 Personal history of other malignant neoplasm of skin: Secondary | ICD-10-CM | POA: Diagnosis not present

## 2021-03-31 DIAGNOSIS — L814 Other melanin hyperpigmentation: Secondary | ICD-10-CM | POA: Diagnosis not present

## 2021-03-31 DIAGNOSIS — L570 Actinic keratosis: Secondary | ICD-10-CM | POA: Diagnosis not present

## 2021-03-31 DIAGNOSIS — L578 Other skin changes due to chronic exposure to nonionizing radiation: Secondary | ICD-10-CM | POA: Diagnosis not present

## 2021-03-31 DIAGNOSIS — L219 Seborrheic dermatitis, unspecified: Secondary | ICD-10-CM | POA: Diagnosis not present

## 2021-03-31 LAB — CUP PACEART REMOTE DEVICE CHECK
Battery Impedance: 1973 Ohm
Battery Remaining Longevity: 34 mo
Battery Voltage: 2.75 V
Brady Statistic AP VP Percent: 5 %
Brady Statistic AP VS Percent: 0 %
Brady Statistic AS VP Percent: 93 %
Brady Statistic AS VS Percent: 3 %
Date Time Interrogation Session: 20220518111357
Implantable Lead Implant Date: 20120302
Implantable Lead Implant Date: 20120302
Implantable Lead Location: 753859
Implantable Lead Location: 753860
Implantable Lead Model: 5076
Implantable Pulse Generator Implant Date: 20120302
Lead Channel Impedance Value: 508 Ohm
Lead Channel Impedance Value: 618 Ohm
Lead Channel Pacing Threshold Amplitude: 0.5 V
Lead Channel Pacing Threshold Amplitude: 0.625 V
Lead Channel Pacing Threshold Pulse Width: 0.4 ms
Lead Channel Pacing Threshold Pulse Width: 0.4 ms
Lead Channel Setting Pacing Amplitude: 2 V
Lead Channel Setting Pacing Amplitude: 2.5 V
Lead Channel Setting Pacing Pulse Width: 0.4 ms
Lead Channel Setting Sensing Sensitivity: 4 mV

## 2021-04-03 ENCOUNTER — Other Ambulatory Visit: Payer: Self-pay | Admitting: Internal Medicine

## 2021-04-03 ENCOUNTER — Encounter: Payer: Self-pay | Admitting: Internal Medicine

## 2021-04-04 MED ORDER — SIMVASTATIN 40 MG PO TABS: 40.0000 mg | ORAL_TABLET | Freq: Every day | ORAL | 1 refills | Status: DC

## 2021-04-04 MED ORDER — GABAPENTIN 300 MG PO CAPS: ORAL_CAPSULE | ORAL | 1 refills | Status: DC

## 2021-04-04 MED ORDER — ALLOPURINOL 300 MG PO TABS: 150.0000 mg | ORAL_TABLET | Freq: Every day | ORAL | 1 refills | Status: DC

## 2021-04-04 MED ORDER — LOSARTAN POTASSIUM 100 MG PO TABS: 100.0000 mg | ORAL_TABLET | Freq: Every day | ORAL | 1 refills | Status: DC

## 2021-04-21 NOTE — Progress Notes (Signed)
Remote pacemaker transmission.   

## 2021-05-03 ENCOUNTER — Other Ambulatory Visit: Payer: Self-pay | Admitting: Internal Medicine

## 2021-05-10 NOTE — Progress Notes (Signed)
Subjective:    Patient ID: [name], male    DOB: 1946/02/10, 75 y.o.   MRN: [id number]  HPI The patient is here for an acute visit.  R posterior hip pain - started a few months ago.  He tried accupunture, Restaurant manager, fast food, rum soaked raisons - nothing helped enough.  The pain is most severe in the morning and runs down to his right posterior calf.  He denies N/T and leg weakness.     Then after a little time it improves.  If he raises his right arm it gets more severe.    He does ice the right lower back daily and it helps.   The laser treatment at the chiropractor helped some.    Medications and allergies reviewed with patient and updated if appropriate.  Patient Active Problem List   Diagnosis Date Noted   Umbilical hernia without obstruction or gangrene 02/02/2021   Hepatic steatosis 08/05/2020   Aortic atherosclerosis ([place]) 08/04/2020   Complex regional pain syndrome type I of the upper limb 07/26/2020   Flexion contracture of joint of hand 07/26/2020   Pacemaker 07/26/2020   Rib pain on left side 06/09/2020   Decreased pulses in feet 06/09/2020   Erectile dysfunction 03/03/2017   Chronic headaches 02/13/2017   Snoring 02/15/2015   Elevated liver enzymes 09/13/2014   Dupuytren's contracture of both hands 09/02/2013   Stiffness of right hand joint 05/07/2013   Skin tear of hand without complication 64/33/2951   History of gout 12/27/2011   Essential hypertension 01/12/2011   Complete heart block ([place]) 01/12/2011   Hyperglycemia 03/02/2009   HYPERPLASIA PROSTATE UNS W/O UR OBST & OTH LUTS 08/13/2008   Hyperlipidemia 02/17/2008   PAROXYSMAL SUPRAVENTRICULAR TACHYCARDIA 02/17/2008    Current Outpatient Medications on File Prior to Visit  Medication Sig Dispense Refill   allopurinol (ZYLOPRIM) 300 MG tablet Take 0.5 tablets (150 mg total) by mouth daily. 45 tablet 1   clobetasol (TEMOVATE) 0.05 % cream Apply 1 application topically daily as needed (itching).      COVID-19 mRNA Vac-TriS, Pfizer, (PFIZER-BIONT COVID-19 VAC-TRIS) SUSP injection Inject into the muscle. 0.3 mL 0   fish oil-omega-3 fatty acids 1000 MG capsule Take 1 g by mouth 2 (two) times daily.     fluorouracil (EFUDEX) 5 % cream Apply 1 application topically daily.     gabapentin (NEURONTIN) 300 MG capsule TAKE 1 CAPSULE BY MOUTH EVERYDAY AT BEDTIME 90 capsule 1   ibuprofen (ADVIL,MOTRIN) 200 MG tablet Take 200 mg by mouth daily.     losartan (COZAAR) 100 MG tablet Take 1 tablet (100 mg total) by mouth daily. 90 tablet 1   sildenafil (REVATIO) 20 MG tablet TAKE 3 TO 5 TABLETS BY MOUTH AS NEEDED 100 tablet 0   simvastatin (ZOCOR) 40 MG tablet Take 1 tablet (40 mg total) by mouth daily at 6 PM. 90 tablet 1   No current facility-administered medications on file prior to visit.    Past Medical History:  Diagnosis Date   Complete heart block (HCC)    S/P  PPM by Dr Rayann Heman   Coronary artery disease    Non-obstructive. Pt had left heart catheterization in August 2009 showing a 30% mid-circumflex lesion, otherwise coronaries were clean angiographically   Hyperlipidemia    Hypertension    OSA (obstructive sleep apnea)    Probable; study never scheduled    Past Surgical History:  Procedure Laterality Date   [place], 2009   2009-  negative   COLONOSCOPY  2011   negative, [facility]   PACEMAKER INSERTION  01/13/11   MDT by Greggory Brandy for complete heart block    Social History   Socioeconomic History   Marital status: Married    Spouse name: Not on file   Number of children: Not on file   Years of education: Not on file   Highest education level: Not on file  Occupational History   Not on file  Tobacco Use   Smoking status: Former    Pack years: 0.00    Types: Cigarettes    Quit date: 11/13/2005    Years since quitting: 15.5   Smokeless tobacco: Never   Tobacco comments:    smoked 1965- 2007, up to 2 ppd  Vaping Use   Vaping Use: Never used  Substance and  Sexual Activity   Alcohol use: Yes    Comment:  21 drinks/ week   Drug use: No   Sexual activity: Not on file  Other Topics Concern   Not on file  Social History Narrative   Exercise: regularly - golf and walks - does both a few times a week   Social Determinants of Health   Financial Resource Strain: Not on file  Food Insecurity: Not on file  Transportation Needs: Not on file  Physical Activity: Not on file  Stress: Not on file  Social Connections: Not on file    Family History  Problem Relation Age of Onset   Heart attack Maternal Grandfather 72   Diabetes Father    Peripheral vascular disease Mother    Cancer Mother        ? primary   Arrhythmia Mother        AFIB   Gout Brother    Leukemia Brother    Other Sister        prediabetic   Stroke Neg Hx     Review of Systems     Objective:   Vitals:   05/11/21 1344  BP: 140/82  Pulse: 96  Temp: 98.2 F (36.8 C)  SpO2: 98%   BP Readings from Last 3 Encounters:  05/11/21 140/82  02/02/21 138/70  10/01/20 134/78   Wt Readings from Last 3 Encounters:  05/11/21 240 lb (108.9 kg)  02/02/21 240 lb (108.9 kg)  10/01/20 241 lb (109.3 kg)   Body mass index is 28.46 kg/m.   Physical Exam Constitutional:      General: He is not in acute distress.    Appearance: Normal appearance. He is not ill-appearing.  HENT:     Head: Normocephalic and atraumatic.  Musculoskeletal:        General: Tenderness (mild tenderness R SI joint.  no lateral hip pain or lumbar spine tenderness) present.  Skin:    General: Skin is warm and dry.     Findings: No erythema or rash.  Neurological:     Mental Status: He is alert.     Sensory: No sensory deficit.     Motor: No weakness.           Assessment & Plan:    See Problem List for Assessment and Plan of chronic medical problems.    This visit occurred during the SARS-CoV-2 public health emergency.  Safety protocols were in place, including screening questions  prior to the visit, additional usage of staff PPE, and extensive cleaning of exam room while observing appropriate contact time as indicated for disinfecting solutions.

## 2021-05-11 ENCOUNTER — Other Ambulatory Visit: Payer: Self-pay

## 2021-05-11 ENCOUNTER — Encounter: Payer: Self-pay | Admitting: Internal Medicine

## 2021-05-11 ENCOUNTER — Ambulatory Visit (INDEPENDENT_AMBULATORY_CARE_PROVIDER_SITE_OTHER): Payer: PPO

## 2021-05-11 ENCOUNTER — Ambulatory Visit (INDEPENDENT_AMBULATORY_CARE_PROVIDER_SITE_OTHER): Payer: PPO | Admitting: Internal Medicine

## 2021-05-11 VITALS — BP 140/82 | HR 96 | Temp 98.2°F | Ht 77.0 in | Wt 240.0 lb

## 2021-05-11 DIAGNOSIS — G8929 Other chronic pain: Secondary | ICD-10-CM | POA: Diagnosis not present

## 2021-05-11 DIAGNOSIS — M533 Sacrococcygeal disorders, not elsewhere classified: Secondary | ICD-10-CM | POA: Diagnosis not present

## 2021-05-11 DIAGNOSIS — M545 Low back pain, unspecified: Secondary | ICD-10-CM | POA: Diagnosis not present

## 2021-05-11 DIAGNOSIS — M160 Bilateral primary osteoarthritis of hip: Secondary | ICD-10-CM | POA: Diagnosis not present

## 2021-05-11 NOTE — Progress Notes (Signed)
Subjective:    I'm seeing this patient as a consultation for:  Dr. Quay Burow. Note will be routed back to referring provider/PCP.  CC: R low back pain  HPI: Pt is a 74 y/o male presenting w/ c/o R-sided low back pain and radiating pain into his R post calf x 3 - 4 months.  He locates his pain to his R lower back that radiates into his post thigh and calf. He has concerned that his pain may be related to SI joint dysfunction.  He also notes that he spends about half of his time at his second home in [facility].  Radiating pain: yes into his R post calf R LE numbness/tingling: No Aggravating factors: raising his R arm when standing; getting out of bed in the morning Treatments tried: chiropractor; accupuncture; rum-soaked raisins; ice; Gabapentin  Diagnostic testing: R hip and L-spine XR- 05/11/21  Past medical history, Surgical history, Family history, Social history, Allergies, and medications have been entered into the medical record, reviewed.   Review of Systems: No new headache, visual changes, nausea, vomiting, diarrhea, constipation, dizziness, abdominal pain, skin rash, fevers, chills, night sweats, weight loss, swollen lymph nodes, body aches, joint swelling, muscle aches, chest pain, shortness of breath, mood changes, visual or auditory hallucinations.   Objective:    Vitals:   05/12/21 1240  BP: 102/68  Pulse: 80  SpO2: 96%   General: Well Developed, well nourished, and in no acute distress.  Neuro/Psych: Alert and oriented x3, extra-ocular muscles intact, able to move all 4 extremities, sensation grossly intact. Skin: Warm and dry, no rashes noted.  Respiratory: Not using accessory muscles, speaking in full sentences, trachea midline.  Cardiovascular: Pulses palpable, no extremity edema. Abdomen: Does not appear distended. MSK: L-spine nontender midline.  Multiple step-offs palpated in midline L-spine. Decreased lumbar motion especially to extension. Right SI  joint palpated nontender.  Patient is tender just lateral to the right SI joint. Lower extremity strength is intact however patient does have some pain with resisted hip abduction strength testing.  Lab and Radiology Results No results found for this or any previous visit (from the past 72 hour(s)). DG Lumbar Spine Complete  Result Date: 05/12/2021 CLINICAL DATA:  Low back pain EXAM: LUMBAR SPINE - COMPLETE 4+ VIEW COMPARISON:  None. FINDINGS: Frontal, lateral, spot lumbosacral lateral, and bilateral oblique views were obtained. There are 5 non-rib-bearing lumbar type vertebral bodies. There is slight lumbar levoscoliosis. There is no fracture. There is 2 mm of retrolisthesis of L2 on L3. There is 5 mm of retrolisthesis of L3 on L4. There is 2 mm of retrolisthesis of L4 on L5. There is 2 mm of anterolisthesis of L5 on S1. There is moderately severe disc space narrowing at L1-2 with moderate disc space narrowing at all other levels. There is facet hypertrophy at all levels bilaterally with facet osteoarthritic change most notable at L5-S1 bilaterally. There is aortic atherosclerosis. IMPRESSION: Multilevel osteoarthritic change. No acute fracture. Spondylolisthesis at several levels is likely due to underlying spondylosis. Slight scoliosis. Aortic Atherosclerosis (ICD10-I70.0). Electronically Signed   By: Lowella Grip III M.D.   On: 05/12/2021 10:55   DG Hip Unilat W OR W/O Pelvis 2-3 Views Right  Result Date: 05/12/2021 CLINICAL DATA:  Right-sided pain EXAM: DG HIP (WITH OR WITHOUT PELVIS) 2-3V RIGHT COMPARISON:  None. FINDINGS: Frontal pelvis as well as frontal and lateral right hip images were obtained. No evident fracture or dislocation. There is moderate symmetric narrowing of each hip  joint. There is rather marked bony overgrowth along the superolateral aspect of the right acetabulum. No erosive change. There are multiple foci of calcification in the iliac and femoral arteries bilaterally.  IMPRESSION: Moderate symmetric narrowing of each hip joint. Rather marked bony overgrowth along the superolateral right acetabulum, a finding that places patient at increased risk for potential femoroacetabular impingement. No acute fracture or dislocation. Multiple foci of atherosclerotic arterial vascular calcification. Electronically Signed   By: Lowella Grip III M.D.   On: 05/12/2021 10:56    I, [name], personally (independently) visualized and performed the interpretation of the images attached in this note.  Procedure: Real-time Ultrasound Guided Injection of right SI joint Device: Philips Affiniti 50G Images permanently stored and available for review in PACS Verbal informed consent obtained.  Discussed risks and benefits of procedure. Warned about infection bleeding damage to structures skin hypopigmentation and fat atrophy among others. Patient expresses understanding and agreement Time-out conducted.   Noted no overlying erythema, induration, or other signs of local infection.   Skin prepped in a sterile fashion.   Local anesthesia: Topical Ethyl chloride.   With sterile technique and under real time ultrasound guidance: 40 mg of Kenalog and 2 mL of Marcaine injected into right SI joint. Fluid seen entering the joint capsule.   Completed without difficulty   Pain mildly resolved suggesting accurate placement of the medication.   Advised to call if fevers/chills, erythema, induration, drainage, or persistent bleeding.   Images permanently stored and available for review in the ultrasound unit.  Impression: Technically successful ultrasound guided injection.       Impression and Recommendations:    Assessment and Plan: 75 y.o. male with pain in the low back/SI joint region..  Patient has multifactorial pain in the low back and extending into the posterior leg.  I believe the predominant cause of pain is probably the spondylolisthesis seen on recent x-ray and probably with  some sciatica as well.  This would significantly improve with core strengthening with physical therapy.  He may have some SI joint dysfunction as well however he did not have immediate great pain response to injection in clinic today indicating that is probably a lesser of his pain generators.  Plan for physical therapy mostly for core strengthening.  He lives part-time in [place] part-time in [place].  Plan for physical therapy referral to the [facility] and [facility].  Recheck in about 6 weeks.  PDMP not reviewed this encounter. Orders Placed This Encounter  Procedures   [place] LIMITED JOINT SPACE STRUCTURES LOW RIGHT(NO LINKED CHARGES)    Order Specific Question:   Reason for Exam (SYMPTOM  OR DIAGNOSIS REQUIRED)    Answer:   Low back pain    Order Specific Question:   Preferred imaging location?    Answer:   [facility]   Ambulatory referral to Physical Therapy    Referral Priority:   Routine    Referral Type:   Physical Medicine    Referral Reason:   Specialty Services Required    Requested Specialty:   Physical Therapy    Number of Visits Requested:   1   Ambulatory referral to Physical Therapy    Referral Priority:   Routine    Referral Type:   Physical Medicine    Referral Reason:   Specialty Services Required    Requested Specialty:   Physical Therapy    Number of  Visits Requested:   1   No orders of the defined types were placed in this encounter.   Discussed warning signs or symptoms. Please see discharge instructions. Patient expresses understanding.   The above documentation has been reviewed and is accurate and complete [name], M.D.

## 2021-05-11 NOTE — Patient Instructions (Addendum)
Have xrays downstairs.     A referral was ordered for sports medicine.       Someone from their office will call you to schedule an appointment.     Sacroiliac Joint Dysfunction  Sacroiliac joint dysfunction is a condition that causes inflammation on one or both sides of the sacroiliac (SI) joint. The SI joint is the joint between two bones of the pelvis called the sacrum and the ilium. The sacrum is the bone at the base of the spine. The ilium is the large bone that forms the hip. This condition causes deep aching or burning pain in the low back. In some cases,the pain may also spread into one or both buttocks, hips, or thighs. What are the causes? This condition may be caused by: Pregnancy. During pregnancy, extra stress is put on the SI joints because the pelvis widens. Injury, such as: Injuries from car crashes. Sports-related injuries. Work-related injuries. Having one leg that is shorter than the other. Conditions that affect the joints, such as: Rheumatoid arthritis. Gout. Psoriatic arthritis. Joint infection (septic arthritis). Sometimes, the cause of SI joint dysfunction is not known. What are the signs or symptoms? Symptoms of this condition include: Aching or burning pain in the lower back. The pain may also spread to other areas, such as: Buttocks. Groin. Thighs. Muscle spasms in or around the painful areas. Increased pain when standing, walking, running, stair climbing, bending, or lifting. How is this diagnosed? This condition is diagnosed with a physical exam and your medical history. During the exam, the health care provider may move one or both of your legs to different positions to check for pain. Various tests may be done to confirm the diagnosis, including: Imaging tests to look for other causes of pain. These may include: MRI. CT scan. Bone scan. Diagnostic injection. A numbing medicine is injected into the SI joint using a needle. If your pain is  temporarily improved or stopped after the injection, this can indicate that SI joint dysfunction is the problem. How is this treated? Treatment depends on the cause and severity of your condition. Treatment options can be noninvasive and may include: Ice or heat applied to the lower back area after an injury. This may help reduce pain and muscle spasms. Medicines to relieve pain or inflammation or to relax the muscles. Wearing a back brace (sacroiliac brace) to help support the joint while your back is healing. Physical therapy to increase muscle strength around the joint and flexibility at the joint. This may also involve learning proper body positions and ways of moving to relieve stress on the joint. Direct manipulation of the SI joint. Use of a device that provides electrical stimulation to help reduce pain at the joint. Other treatments may include: Injections of steroid medicine into the joint to reduce pain and swelling. Radiofrequency ablation. This treatment uses heat to burn away nerves that are carrying pain messages from the joint. Surgery to put in screws and plates that limit or prevent joint motion. This is rare. Follow these instructions at home: Medicines Take over-the-counter and prescription medicines only as told by your health care provider. Ask your health care provider if the medicine prescribed to you: Requires you to avoid driving or using machinery. Can cause constipation. You may need to take these actions to prevent or treat constipation: Drink enough fluid to keep your urine pale yellow. Take over-the-counter or prescription medicines. Eat foods that are high in fiber, such as beans, whole grains, and fresh  fruits and vegetables. Limit foods that are high in fat and processed sugars, such as fried or sweet foods. If you have a brace: Wear the brace as told by your health care provider. Remove it only as told by your health care provider. Keep the brace clean. If  the brace is not waterproof: Do not let it get wet. Cover it with a watertight covering when you take a bath or a shower. Managing pain, stiffness, and swelling     Icing can help with pain and swelling. Heat may help with muscle tension or spasms. Ask your health care provider if you should use ice or heat. If directed, put ice on the affected area: If you have a removable brace, remove it as told by your health care provider. Put ice in a plastic bag. Place a towel between your skin and the bag. Leave the ice on for 20 minutes, 2-3 times a day. Remove the ice if your skin turns bright red. This is very important. If you cannot feel pain, heat, or cold, you have a greater risk of damage to the area. If directed, apply heat to the affected area as often as told by your health care provider. Use the heat source that your health care provider recommends, such as a moist heat pack or a heating pad. Place a towel between your skin and the heat source. Leave the heat on for 20-30 minutes. Remove the heat if your skin turns bright red. This is especially important if you are unable to feel pain, heat, or cold. You may have a greater risk of getting burned. General instructions Rest as needed. Return to your normal activities as told by your health care provider. Ask your health care provider what activities are safe for you. Do exercises as told by your health care provider or physical therapist. Keep all follow-up visits. This is important. Contact a health care provider if: Your pain is not controlled with medicine. You have a fever. Your pain is getting worse. Get help right away if: You have weakness, numbness, or tingling in your legs or feet. You lose control of your bladder or bowels. Summary Sacroiliac (SI) joint dysfunction is a condition that causes inflammation on one or both sides of the SI joint. This condition causes deep aching or burning pain in the low back. In some cases,  the pain may also spread into one or both buttocks, hips, or thighs. Treatment depends on the cause and severity of your condition. It may include medicines to reduce pain and swelling or to relax muscles. This information is not intended to replace advice given to you by your health care provider. Make sure you discuss any questions you have with your healthcare provider. Document Revised: 03/11/2020 Document Reviewed: 03/11/2020 Elsevier Patient Education  [place].

## 2021-05-11 NOTE — Assessment & Plan Note (Signed)
Subacute R SI joint pain with sciatica Has tried several things  Icing the SI joint helps Will obtain xrays today - Lspine and hips Refer to sports med

## 2021-05-12 ENCOUNTER — Ambulatory Visit: Payer: Self-pay

## 2021-05-12 ENCOUNTER — Encounter: Payer: Self-pay | Admitting: Family Medicine

## 2021-05-12 ENCOUNTER — Ambulatory Visit: Payer: PPO | Admitting: Family Medicine

## 2021-05-12 VITALS — BP 102/68 | HR 80 | Ht 77.0 in | Wt 241.2 lb

## 2021-05-12 DIAGNOSIS — M533 Sacrococcygeal disorders, not elsewhere classified: Secondary | ICD-10-CM

## 2021-05-12 DIAGNOSIS — G8929 Other chronic pain: Secondary | ICD-10-CM | POA: Diagnosis not present

## 2021-05-12 DIAGNOSIS — M5441 Lumbago with sciatica, right side: Secondary | ICD-10-CM | POA: Diagnosis not present

## 2021-05-12 NOTE — Patient Instructions (Addendum)
Thank you for coming in today.   You had a R SIJ injection today.  Call or go to the ER if you develop a large red swollen joint with extreme pain or oozing puss.    If you want to do PT at [facility] we could use [facility].   Pt here at [facility].   Recheck in about 6 weeks.   Let me know if you have a problem;

## 2021-05-17 ENCOUNTER — Encounter: Payer: Self-pay | Admitting: Physical Therapy

## 2021-05-17 ENCOUNTER — Ambulatory Visit: Payer: PPO | Admitting: Physical Therapy

## 2021-05-17 ENCOUNTER — Other Ambulatory Visit: Payer: Self-pay

## 2021-05-17 DIAGNOSIS — M5441 Lumbago with sciatica, right side: Secondary | ICD-10-CM | POA: Diagnosis not present

## 2021-05-17 NOTE — Patient Instructions (Signed)
Access Code: [facility] URL: [email or web address] Date: 05/17/2021 Prepared by: Lyndee Hensen  Exercises Supine Posterior Pelvic Tilt - 2 x daily - 3 reps - 30 hold Single Knee to Chest Stretch - 2 x daily - 3 reps - 30 hold Seated Hamstring Stretch - 2 x daily - 3 reps - 30 hold Supine Shoulder Flexion AAROM with Hands Clasped - 1 x daily - 5 reps - 10 hold

## 2021-05-23 ENCOUNTER — Encounter: Payer: Self-pay | Admitting: Physical Therapy

## 2021-05-23 ENCOUNTER — Ambulatory Visit: Payer: PPO | Admitting: Physical Therapy

## 2021-05-23 ENCOUNTER — Other Ambulatory Visit: Payer: Self-pay

## 2021-05-23 DIAGNOSIS — M5441 Lumbago with sciatica, right side: Secondary | ICD-10-CM | POA: Diagnosis not present

## 2021-05-23 NOTE — Therapy (Signed)
[facility] [address] [place], [place], [address] Phone: [phone]   Fax:  [phone]  Physical Therapy Evaluation  Patient Details  Name: [name] MRN: [id number] Date of Birth: [birth year] Referring Provider (PT): [name]   Encounter Date: 05/17/2021   PT End of Session - 05/23/21 0823     Visit Number 1    Number of Visits 12    Date for PT Re-Evaluation 06/28/21    Authorization Type HTA    PT Start Time 5027    PT Stop Time 1558    PT Time Calculation (min) 41 min    Activity Tolerance Patient tolerated treatment well    Behavior During Therapy WFL for tasks assessed/performed             Past Medical History:  Diagnosis Date   Complete heart block (HCC)    S/P  PPM by Dr Rayann Heman   Coronary artery disease    Non-obstructive. Pt had left heart catheterization in August 2009 showing a 30% mid-circumflex lesion, otherwise coronaries were clean angiographically   Hyperlipidemia    Hypertension    OSA (obstructive sleep apnea)    Probable; study never scheduled    Past Surgical History:  Procedure Laterality Date   [place], 2009   2009- negative   COLONOSCOPY  2011   negative, [facility]   PACEMAKER INSERTION  01/13/11   MDT by Greggory Brandy for complete heart block    There were no vitals filed for this visit.    Subjective Assessment - 05/23/21 0821     Subjective Pain for about 3 months, no incident to report. States pain in R side of back, and into R LE. Did have injection in SI, Still has pain in AMs, in R LE, but better as day goes on. Also states bil hip pain for years, feels this pain is tolerable, and different than new back pain.    Pertinent History Pacemaker    Limitations Lifting;Standing;Walking;House hold activities    Diagnostic tests Multilevel osteoarthritic change. No acute fracture.  Spondylolisthesis at several levels is likely due to underlying  spondylosis. Slight  scoliosis.    Patient Stated Goals decreased pain , return to golf    Currently in Pain? Yes    Pain Score 3     Pain Location Back    Pain Orientation Right    Pain Descriptors / Indicators Aching    Pain Type Acute pain    Pain Radiating Towards into R LE    Pain Onset More than a month ago    Pain Frequency Intermittent    Aggravating Factors  was up to 5-6/10 prior to shot. Pain worse in AMs.    Pain Relieving Factors none stated                [facility] PT Assessment - 05/23/21 0001       Assessment   Medical Diagnosis Low Back Pain    Referring Provider (PT) [name]    Prior Therapy no      Precautions   Precautions ICD/Pacemaker    Precaution Comments pacemaker      Balance Screen   Has the patient fallen in the past 6 months No      Prior Function   Level of Independence Independent      Cognition   Overall Cognitive Status Within Functional Limits for tasks assessed      AROM   Overall  AROM Comments Lumbar: mod defictis for all motions. Hips: mild deficit for flex and rotation bilaterally      Strength   Overall Strength Comments Hips: 4/5, Knee: 5/5,      Palpation   Palpation comment Pain in R low lumbar region, into R SI, and R glute min, piriformis. Hypomobile lumbar spine with PAs      Special Tests   Other special tests Neg SLR,                        Objective measurements completed on examination: See above findings.       [facility] Adult PT Treatment/Exercise - 05/23/21 0001       Exercises   Exercises Lumbar      Lumbar Exercises: Stretches   Active Hamstring Stretch 3 reps;30 seconds    Active Hamstring Stretch Limitations seated    Single Knee to Chest Stretch 3 reps;30 seconds    Pelvic Tilt 15 reps    Other Lumbar Stretch Exercise Supine shoulder fleixon with cane, for lat stretch 10 sec x 5;                    PT Education - 05/23/21 4081     Education Details PT POC, Exam findings, HEP     Person(s) Educated Patient    Methods Explanation;Demonstration;Tactile cues;Verbal cues;Handout    Comprehension Verbalized understanding;Returned demonstration;Verbal cues required;Tactile cues required;Need further instruction              PT Short Term Goals - 05/23/21 0832       PT SHORT TERM GOAL #1   Title Pt to be independent with initial HEP    Time 2    Status New    Target Date 05/31/21               PT Long Term Goals - 05/23/21 0832       PT LONG TERM GOAL #1   Title Pt to be independent with final HEP    Time 6    Period Weeks    Status New    Target Date 06/28/21      PT LONG TERM GOAL #2   Title Pt to report ability for standing, walking, for at least 30 min with pain in lumbar spine and R LE decreased , to 0-2/10    Time 6    Period Weeks    Status New    Target Date 06/28/21      PT LONG TERM GOAL #3   Title Pt to demo improved lumbar ROM for flexion to have only minimal deficit, to improve ability for ADLs and IADLS.    Time 6    Period Weeks    Status New    Target Date 06/28/21      PT LONG TERM GOAL #4   Title Pt to demo optimal posture for bend, lift, squat mechanics, to improve pain and safety with IADLs.    Time 6    Period Weeks    Status New    Target Date 06/28/21                    Plan - 05/23/21 0836     Clinical Impression Statement Pt presents with primary complaint of increased pain in low back and into R LE. Pt with hypomobile lumbar spine, with decreased ROM. He also has decreased strength in hips and core. Pt to benefit  from education on  mechanics for bend, lift, squat as well as HEP. Pt with decreased ability for full functional activities, due to pain and deficits. Pt to benefit from skilled PT to improve deficits and pain, and return to PLOF.    Examination-Activity Limitations Lift;Stand;Locomotion Level;Carry;Squat;Bend    Examination-Participation Restrictions Cleaning;Meal Prep;Yard Work;Community  Activity    Stability/Clinical Decision Making Stable/Uncomplicated    Clinical Decision Making Low    Rehab Potential Good    PT Frequency 2x / week    PT Duration 6 weeks    PT Treatment/Interventions ADLs/Self Care Home Management;Canalith Repostioning;Cryotherapy;DME Instruction;Traction;Moist Heat;Gait training;Stair training;Functional mobility training;Therapeutic activities;Therapeutic exercise;Balance training;Patient/family education;Neuromuscular re-education;Manual techniques;Taping;Dry needling;Passive range of motion;Spinal Manipulations;Joint Manipulations    PT Home Exercise Plan [facility]    Consulted and Agree with Plan of Care Patient             Patient will benefit from skilled therapeutic intervention in order to improve the following deficits and impairments:  Pain, Improper body mechanics, Decreased mobility, Hypomobility, Decreased strength, Decreased range of motion, Decreased activity tolerance, Impaired flexibility  Visit Diagnosis: Acute right-sided low back pain with right-sided sciatica     Problem List Patient Active Problem List   Diagnosis Date Noted   Chronic right SI joint pain 93/90/3009   Umbilical hernia without obstruction or gangrene 02/02/2021   Hepatic steatosis 08/05/2020   Aortic atherosclerosis ([place]) 08/04/2020   Complex regional pain syndrome type I of the upper limb 07/26/2020   Flexion contracture of joint of hand 07/26/2020   Pacemaker 07/26/2020   Rib pain on left side 06/09/2020   Decreased pulses in feet 06/09/2020   Erectile dysfunction 03/03/2017   Chronic headaches 02/13/2017   Snoring 02/15/2015   Elevated liver enzymes 09/13/2014   Dupuytren's contracture of both hands 09/02/2013   Stiffness of right hand joint 05/07/2013   Skin tear of hand without complication 23/30/0762   History of gout 12/27/2011   Essential hypertension 01/12/2011   Complete heart block ([place]) 01/12/2011   Hyperglycemia 03/02/2009    HYPERPLASIA PROSTATE UNS W/O UR OBST & OTH LUTS 08/13/2008   Hyperlipidemia 02/17/2008   PAROXYSMAL SUPRAVENTRICULAR TACHYCARDIA 02/17/2008    Lyndee Hensen, PT, DPT 12:51 PM  05/23/21    [facility] [place], [place], [address] Phone: [phone]   Fax:  [phone]  Name: [name] MRN: [id number] Date of Birth: November 29, 1945

## 2021-05-24 NOTE — Therapy (Signed)
[facility] [address] [place], [place], [address] Phone: [phone]   Fax:  [phone]  Physical Therapy Treatment  Patient Details  Name: [name] MRN: [id number] Date of Birth: 03-May-1946 Referring Provider (PT): [name]   Encounter Date: 05/23/2021   PT End of Session - 05/23/21 1539     Visit Number 2    Number of Visits 12    Date for PT Re-Evaluation 06/28/21    Authorization Type HTA    PT Start Time 1520    PT Stop Time 1600    PT Time Calculation (min) 40 min    Activity Tolerance Patient tolerated treatment well    Behavior During Therapy WFL for tasks assessed/performed             Past Medical History:  Diagnosis Date   Complete heart block (HCC)    S/P  PPM by Dr Rayann Heman   Coronary artery disease    Non-obstructive. Pt had left heart catheterization in August 2009 showing a 30% mid-circumflex lesion, otherwise coronaries were clean angiographically   Hyperlipidemia    Hypertension    OSA (obstructive sleep apnea)    Probable; study never scheduled    Past Surgical History:  Procedure Laterality Date   [place], 2009   2009- negative   COLONOSCOPY  2011   negative, [facility]   PACEMAKER INSERTION  01/13/11   MDT by Greggory Brandy for complete heart block    There were no vitals filed for this visit.   Subjective Assessment - 05/23/21 1538     Subjective Pt states less pain in R LE. Still having pain and stiffness in AMs.    Currently in Pain? Yes    Pain Score 2     Pain Location Back    Pain Orientation Right    Pain Descriptors / Indicators Aching    Pain Type Acute pain    Pain Onset More than a month ago    Pain Frequency Intermittent                               [facility] Adult PT Treatment/Exercise - 05/24/21 0001       Exercises   Exercises Lumbar      Lumbar Exercises: Stretches   Active Hamstring Stretch 3 reps;30 seconds    Active  Hamstring Stretch Limitations seated    Single Knee to Chest Stretch 3 reps;30 seconds    Lower Trunk Rotation 5 reps;10 seconds    Pelvic Tilt 15 reps    Other Lumbar Stretch Exercise Supine shoulder fleixon with cane, for lat stretch 10 sec x 5;      Lumbar Exercises: Supine   Ab Set 20 reps    AB Set Limitations with education on active contraction.    Bent Knee Raise 20 reps      Manual Therapy   Manual Therapy Joint mobilization;Passive ROM;Manual Traction    Manual Traction long leg distaction for lumbar pump x 2 min bil;                      PT Short Term Goals - 05/23/21 0832       PT SHORT TERM GOAL #1   Title Pt to be independent with initial HEP    Time 2    Status New    Target Date 05/31/21  PT Long Term Goals - 05/23/21 9381       PT LONG TERM GOAL #1   Title Pt to be independent with final HEP    Time 6    Period Weeks    Status New    Target Date 06/28/21      PT LONG TERM GOAL #2   Title Pt to report ability for standing, walking, for at least 30 min with pain in lumbar spine and R LE decreased , to 0-2/10    Time 6    Period Weeks    Status New    Target Date 06/28/21      PT LONG TERM GOAL #3   Title Pt to demo improved lumbar ROM for flexion to have only minimal deficit, to improve ability for ADLs and IADLS.    Time 6    Period Weeks    Status New    Target Date 06/28/21      PT LONG TERM GOAL #4   Title Pt to demo optimal posture for bend, lift, squat mechanics, to improve pain and safety with IADLs.    Time 6    Period Weeks    Status New    Target Date 06/28/21                   Plan - 05/24/21 0817     Clinical Impression Statement Pt able to progress ther ex today, without increased pain. He does have frequent muscle spasms in R side of thoracic region with activities today. Pt educated on active TA contraction, pt challenged with this, and will benefit from continued practice. HEP updated  today. Unable to perform some manual techniques, lumbar mobilizations, due to multiple areas of spondylolysthesis.Pt also wiht minimal pain to palpate low back today, but is getting increased pain with standing activity and compression. Plan to progress strength as tolerated.    Examination-Activity Limitations Lift;Stand;Locomotion Level;Carry;Squat;Bend    Examination-Participation Restrictions Cleaning;Meal Prep;Yard Work;Community Activity    Stability/Clinical Decision Making Stable/Uncomplicated    Rehab Potential Good    PT Frequency 2x / week    PT Duration 6 weeks    PT Treatment/Interventions ADLs/Self Care Home Management;Canalith Repostioning;Cryotherapy;DME Instruction;Traction;Moist Heat;Gait training;Stair training;Functional mobility training;Therapeutic activities;Therapeutic exercise;Balance training;Patient/family education;Neuromuscular re-education;Manual techniques;Taping;Dry needling;Passive range of motion;Spinal Manipulations;Joint Manipulations    PT Home Exercise Plan [facility]    Consulted and Agree with Plan of Care Patient             Patient will benefit from skilled therapeutic intervention in order to improve the following deficits and impairments:  Pain, Improper body mechanics, Decreased mobility, Hypomobility, Decreased strength, Decreased range of motion, Decreased activity tolerance, Impaired flexibility  Visit Diagnosis: Acute right-sided low back pain with right-sided sciatica     Problem List Patient Active Problem List   Diagnosis Date Noted   Chronic right SI joint pain 82/99/3716   Umbilical hernia without obstruction or gangrene 02/02/2021   Hepatic steatosis 08/05/2020   Aortic atherosclerosis ([place]) 08/04/2020   Complex regional pain syndrome type I of the upper limb 07/26/2020   Flexion contracture of joint of hand 07/26/2020   Pacemaker 07/26/2020   Rib pain on left side 06/09/2020   Decreased pulses in feet 06/09/2020   Erectile  dysfunction 03/03/2017   Chronic headaches 02/13/2017   Snoring 02/15/2015   Elevated liver enzymes 09/13/2014   Dupuytren's contracture of both hands 09/02/2013   Stiffness of right hand joint 05/07/2013   Skin tear of  hand without complication 00/34/9179   History of gout 12/27/2011   Essential hypertension 01/12/2011   Complete heart block ([place]) 01/12/2011   Hyperglycemia 03/02/2009   HYPERPLASIA PROSTATE UNS W/O UR OBST & OTH LUTS 08/13/2008   Hyperlipidemia 02/17/2008   PAROXYSMAL SUPRAVENTRICULAR TACHYCARDIA 02/17/2008   Lyndee Hensen, PT, DPT 8:21 AM  05/24/21    [facility] [place], [place], [address] Phone: [phone]   Fax:  [phone]  Name: FRANCIS DOENGES MRN: [id number] Date of Birth: [birth year]

## 2021-05-26 ENCOUNTER — Encounter: Payer: Self-pay | Admitting: Physical Therapy

## 2021-05-26 ENCOUNTER — Ambulatory Visit: Payer: PPO | Admitting: Physical Therapy

## 2021-05-26 ENCOUNTER — Other Ambulatory Visit: Payer: Self-pay

## 2021-05-26 DIAGNOSIS — M5441 Lumbago with sciatica, right side: Secondary | ICD-10-CM | POA: Diagnosis not present

## 2021-05-26 NOTE — Patient Instructions (Signed)
Access Code: [facility] URL: [email or web address] Date: 05/26/2021 Prepared by: Lyndee Hensen  Exercises Supine Posterior Pelvic Tilt - 2 x daily - 3 reps - 30 hold Single Knee to Chest Stretch - 2 x daily - 3 reps - 30 hold Seated Hamstring Stretch - 2 x daily - 3 reps - 30 hold Supine Lower Trunk Rotation - 2 x daily - 10 reps - 5 hold Sidelying Hip Abduction - 1 x daily - 1 sets - 10 reps Supine March - 1 x daily - 2 sets - 10 reps

## 2021-05-29 ENCOUNTER — Encounter: Payer: Self-pay | Admitting: Physical Therapy

## 2021-05-29 NOTE — Therapy (Signed)
[facility] [address] [place], [place], [address] Phone: [phone]   Fax:  [phone]  Physical Therapy Treatment  Patient Details  Name: [name] MRN: [id number] Date of Birth: [birth year] Referring Provider (PT): [name]   Encounter Date: 05/26/2021   PT End of Session - 05/29/21 1711     Visit Number 3    Number of Visits 12    Date for PT Re-Evaluation 06/28/21    Authorization Type HTA    PT Start Time 4627    PT Stop Time 1555    PT Time Calculation (min) 40 min    Activity Tolerance Patient tolerated treatment well    Behavior During Therapy WFL for tasks assessed/performed             Past Medical History:  Diagnosis Date   Complete heart block (HCC)    S/P  PPM by Dr Rayann Heman   Coronary artery disease    Non-obstructive. Pt had left heart catheterization in August 2009 showing a 30% mid-circumflex lesion, otherwise coronaries were clean angiographically   Hyperlipidemia    Hypertension    OSA (obstructive sleep apnea)    Probable; study never scheduled    Past Surgical History:  Procedure Laterality Date   [place], 2009   2009- negative   COLONOSCOPY  2011   negative, [facility]   PACEMAKER INSERTION  01/13/11   MDT by Greggory Brandy for complete heart block    There were no vitals filed for this visit.   Subjective Assessment - 05/29/21 1710     Subjective Pt with no new complaints. Pain doing better in AMs.    Currently in Pain? Yes    Pain Score 2     Pain Location Back    Pain Orientation Right    Pain Descriptors / Indicators Aching    Pain Type Acute pain    Pain Onset More than a month ago    Pain Frequency Intermittent                               [facility] Adult PT Treatment/Exercise - 05/29/21 0001       Exercises   Exercises Lumbar      Lumbar Exercises: Stretches   Active Hamstring Stretch 3 reps;30 seconds    Active Hamstring Stretch  Limitations seated    Single Knee to Chest Stretch 3 reps;30 seconds    Lower Trunk Rotation 5 reps;10 seconds      Lumbar Exercises: Aerobic   Recumbent Bike L 1 x 8 min;      Lumbar Exercises: Standing   Other Standing Lumbar Exercises March x 20; hip abd x 20 bil;      Lumbar Exercises: Supine   Ab Set 10 reps    AB Set Limitations with education on active contraction.    Bent Knee Raise 20 reps    Bridge 10 reps    Straight Leg Raise 10 reps      Lumbar Exercises: Sidelying   Hip Abduction 10 reps;Both      Manual Therapy   Manual Therapy Joint mobilization;Passive ROM;Manual Traction    Manual Traction long leg distaction for lumbar pump x 2 min bil;                      PT Short Term Goals - 05/23/21 0350  PT SHORT TERM GOAL #1   Title Pt to be independent with initial HEP    Time 2    Status New    Target Date 05/31/21               PT Long Term Goals - 05/23/21 0832       PT LONG TERM GOAL #1   Title Pt to be independent with final HEP    Time 6    Period Weeks    Status New    Target Date 06/28/21      PT LONG TERM GOAL #2   Title Pt to report ability for standing, walking, for at least 30 min with pain in lumbar spine and R LE decreased , to 0-2/10    Time 6    Period Weeks    Status New    Target Date 06/28/21      PT LONG TERM GOAL #3   Title Pt to demo improved lumbar ROM for flexion to have only minimal deficit, to improve ability for ADLs and IADLS.    Time 6    Period Weeks    Status New    Target Date 06/28/21      PT LONG TERM GOAL #4   Title Pt to demo optimal posture for bend, lift, squat mechanics, to improve pain and safety with IADLs.    Time 6    Period Weeks    Status New    Target Date 06/28/21                   Plan - 05/29/21 1713     Clinical Impression Statement Pt having some pain relief, with pain less often, and not feeling as stiff in AMs. He is doing well with HEP. Pt will be seen  next week, then he is going away/out of town for a couple months.    Examination-Activity Limitations Lift;Stand;Locomotion Level;Carry;Squat;Bend    Examination-Participation Restrictions Cleaning;Meal Prep;Yard Work;Community Activity    Stability/Clinical Decision Making Stable/Uncomplicated    Rehab Potential Good    PT Frequency 2x / week    PT Duration 6 weeks    PT Treatment/Interventions ADLs/Self Care Home Management;Canalith Repostioning;Cryotherapy;DME Instruction;Traction;Moist Heat;Gait training;Stair training;Functional mobility training;Therapeutic activities;Therapeutic exercise;Balance training;Patient/family education;Neuromuscular re-education;Manual techniques;Taping;Dry needling;Passive range of motion;Spinal Manipulations;Joint Manipulations    PT Home Exercise Plan [facility]    Consulted and Agree with Plan of Care Patient             Patient will benefit from skilled therapeutic intervention in order to improve the following deficits and impairments:  Pain, Improper body mechanics, Decreased mobility, Hypomobility, Decreased strength, Decreased range of motion, Decreased activity tolerance, Impaired flexibility  Visit Diagnosis: Acute right-sided low back pain with right-sided sciatica     Problem List Patient Active Problem List   Diagnosis Date Noted   Chronic right SI joint pain 53/29/9242   Umbilical hernia without obstruction or gangrene 02/02/2021   Hepatic steatosis 08/05/2020   Aortic atherosclerosis ([place]) 08/04/2020   Complex regional pain syndrome type I of the upper limb 07/26/2020   Flexion contracture of joint of hand 07/26/2020   Pacemaker 07/26/2020   Rib pain on left side 06/09/2020   Decreased pulses in feet 06/09/2020   Erectile dysfunction 03/03/2017   Chronic headaches 02/13/2017   Snoring 02/15/2015   Elevated liver enzymes 09/13/2014   Dupuytren's contracture of both hands 09/02/2013   Stiffness of right hand joint 05/07/2013    Skin tear  of hand without complication 55/97/4163   History of gout 12/27/2011   Essential hypertension 01/12/2011   Complete heart block ([place]) 01/12/2011   Hyperglycemia 03/02/2009   HYPERPLASIA PROSTATE UNS W/O UR OBST & OTH LUTS 08/13/2008   Hyperlipidemia 02/17/2008   PAROXYSMAL SUPRAVENTRICULAR TACHYCARDIA 02/17/2008   Lyndee Hensen, PT, DPT 5:24 PM  05/29/21     [facility] [place], [place], [address] Phone: [phone]   Fax:  [phone]  Name: [name] MRN: [id number] Date of Birth: 1946-05-21

## 2021-05-30 ENCOUNTER — Other Ambulatory Visit: Payer: Self-pay

## 2021-05-30 ENCOUNTER — Encounter: Payer: Self-pay | Admitting: Physical Therapy

## 2021-05-30 ENCOUNTER — Ambulatory Visit: Payer: PPO | Admitting: Physical Therapy

## 2021-05-30 DIAGNOSIS — M5441 Lumbago with sciatica, right side: Secondary | ICD-10-CM | POA: Diagnosis not present

## 2021-05-30 NOTE — Patient Instructions (Signed)
Access Code: [facility] URL: [email or web address] Date: 05/30/2021 Prepared by: Lyndee Hensen  Exercises Supine Posterior Pelvic Tilt - 2 x daily - 3 reps - 30 hold Single Knee to Chest Stretch - 2 x daily - 3 reps - 30 hold Seated Hamstring Stretch - 2 x daily - 3 reps - 30 hold Supine Lower Trunk Rotation - 2 x daily - 10 reps - 5 hold Sidelying Hip Abduction - 1 x daily - 1 sets - 10 reps Supine March - 1 x daily - 2 sets - 10 reps Supine Bridge - 1 x daily - 2 sets - 10 reps Sit to Stand - 1 x daily - 1 sets - 10 reps

## 2021-05-30 NOTE — Therapy (Signed)
[facility] [address] [place], [place], [address] Phone: [phone]   Fax:  [phone]  Physical Therapy Treatment  Patient Details  Name: [name] MRN: [id number] Date of Birth: [birth year] Referring Provider (PT): [name]   Encounter Date: 05/30/2021   PT End of Session - 05/30/21 1530     Visit Number 4    Number of Visits 12    Date for PT Re-Evaluation 06/28/21    Authorization Type HTA    PT Start Time 1522    PT Stop Time 1600    PT Time Calculation (min) 38 min    Activity Tolerance Patient tolerated treatment well    Behavior During Therapy WFL for tasks assessed/performed             Past Medical History:  Diagnosis Date   Complete heart block (HCC)    S/P  PPM by Dr Rayann Heman   Coronary artery disease    Non-obstructive. Pt had left heart catheterization in August 2009 showing a 30% mid-circumflex lesion, otherwise coronaries were clean angiographically   Hyperlipidemia    Hypertension    OSA (obstructive sleep apnea)    Probable; study never scheduled    Past Surgical History:  Procedure Laterality Date   [place], 2009   2009- negative   COLONOSCOPY  2011   negative, [facility]   PACEMAKER INSERTION  01/13/11   MDT by Greggory Brandy for complete heart block    There were no vitals filed for this visit.   Subjective Assessment - 05/30/21 1529     Subjective Pt states variable pain, 3-5/10 over the weekend. Has been doing HEP in am, feeling a bit less stiff in mornings. Still having most pain in the mornings.    Diagnostic tests recent  x-ray results:   Multilevel osteoarthritic change. No acute fracture.  Spondylolisthesis at several levels is likely due to underlying  spondylosis. Slight scoliosis.    Currently in Pain? Yes    Pain Score 3     Pain Location Back    Pain Orientation Right    Pain Descriptors / Indicators Aching    Pain Type Acute pain    Pain Onset More than a  month ago    Pain Frequency Intermittent                               [facility] Adult PT Treatment/Exercise - 05/30/21 0001       Exercises   Exercises Lumbar      Lumbar Exercises: Stretches   Active Hamstring Stretch 3 reps;30 seconds    Active Hamstring Stretch Limitations seated    Single Knee to Chest Stretch 3 reps;30 seconds    Lower Trunk Rotation 5 reps;10 seconds    Other Lumbar Stretch Exercise Seated lumbar flexion x 30 sec;      Lumbar Exercises: Aerobic   Recumbent Bike L 1 x 8 min;      Lumbar Exercises: Standing   Other Standing Lumbar Exercises March x 20; hip abd x 20 bil;      Lumbar Exercises: Seated   Sit to Stand 10 reps      Lumbar Exercises: Supine   Ab Set --    AB Set Limitations --    Clam 20 reps    Clam Limitations Bl TB with TA    Bent Knee Raise 20 reps    Bent  Knee Raise Limitations with Bl TB    Bridge 20 reps    Straight Leg Raise --      Lumbar Exercises: Sidelying   Hip Abduction 10 reps;Both      Manual Therapy   Manual Therapy Joint mobilization;Passive ROM;Manual Traction    Manual Traction long leg distaction for lumbar pump x 2 min bil;                    PT Education - 05/30/21 1530     Education Details Reviewed HEP    Person(s) Educated Patient    Methods Explanation;Demonstration;Tactile cues;Verbal cues;Handout    Comprehension Verbalized understanding;Returned demonstration;Verbal cues required;Tactile cues required;Need further instruction              PT Short Term Goals - 05/30/21 1531       PT SHORT TERM GOAL #1   Title Pt to be independent with initial HEP    Time 2    Status Achieved    Target Date 05/31/21               PT Long Term Goals - 05/23/21 0832       PT LONG TERM GOAL #1   Title Pt to be independent with final HEP    Time 6    Period Weeks    Status New    Target Date 06/28/21      PT LONG TERM GOAL #2   Title Pt to report ability for  standing, walking, for at least 30 min with pain in lumbar spine and R LE decreased , to 0-2/10    Time 6    Period Weeks    Status New    Target Date 06/28/21      PT LONG TERM GOAL #3   Title Pt to demo improved lumbar ROM for flexion to have only minimal deficit, to improve ability for ADLs and IADLS.    Time 6    Period Weeks    Status New    Target Date 06/28/21      PT LONG TERM GOAL #4   Title Pt to demo optimal posture for bend, lift, squat mechanics, to improve pain and safety with IADLs.    Time 6    Period Weeks    Status New    Target Date 06/28/21                   Plan - 05/30/21 1531     Clinical Impression Statement Pt has had mild pain reduction in mornings. He is doing well with HEP, and is doing some first thing in the morning. Pt has been able to progress mobility and light strengthening wtihout increased pain during session. Will benefit from continued strengthening and pain relief. Pt doing out of town for a month, will be getting PT there, will return when he gets back.    Examination-Activity Limitations Lift;Stand;Locomotion Level;Carry;Squat;Bend    Examination-Participation Restrictions Cleaning;Meal Prep;Yard Work;Community Activity    Stability/Clinical Decision Making Stable/Uncomplicated    Rehab Potential Good    PT Frequency 2x / week    PT Duration 6 weeks    PT Treatment/Interventions ADLs/Self Care Home Management;Canalith Repostioning;Cryotherapy;DME Instruction;Traction;Moist Heat;Gait training;Stair training;Functional mobility training;Therapeutic activities;Therapeutic exercise;Balance training;Patient/family education;Neuromuscular re-education;Manual techniques;Taping;Dry needling;Passive range of motion;Spinal Manipulations;Joint Manipulations    PT Home Exercise Plan [facility]    Consulted and Agree with Plan of Care Patient  Patient will benefit from skilled therapeutic intervention in order to improve the  following deficits and impairments:  Pain, Improper body mechanics, Decreased mobility, Hypomobility, Decreased strength, Decreased range of motion, Decreased activity tolerance, Impaired flexibility  Visit Diagnosis: Acute right-sided low back pain with right-sided sciatica     Problem List Patient Active Problem List   Diagnosis Date Noted   Chronic right SI joint pain 67/28/9791   Umbilical hernia without obstruction or gangrene 02/02/2021   Hepatic steatosis 08/05/2020   Aortic atherosclerosis ([place]) 08/04/2020   Complex regional pain syndrome type I of the upper limb 07/26/2020   Flexion contracture of joint of hand 07/26/2020   Pacemaker 07/26/2020   Rib pain on left side 06/09/2020   Decreased pulses in feet 06/09/2020   Erectile dysfunction 03/03/2017   Chronic headaches 02/13/2017   Snoring 02/15/2015   Elevated liver enzymes 09/13/2014   Dupuytren's contracture of both hands 09/02/2013   Stiffness of right hand joint 05/07/2013   Skin tear of hand without complication 50/41/3643   History of gout 12/27/2011   Essential hypertension 01/12/2011   Complete heart block ([place]) 01/12/2011   Hyperglycemia 03/02/2009   HYPERPLASIA PROSTATE UNS W/O UR OBST & OTH LUTS 08/13/2008   Hyperlipidemia 02/17/2008   PAROXYSMAL SUPRAVENTRICULAR TACHYCARDIA 02/17/2008    Lyndee Hensen, PT, DPT 3:58 PM  05/30/21    [facility] [address] [place], [place], [address] Phone: [phone]   Fax:  [phone]  Name: ABDO DENAULT MRN: [id number] Date of Birth: 07/21/1946

## 2021-06-08 DIAGNOSIS — M461 Sacroiliitis, not elsewhere classified: Secondary | ICD-10-CM | POA: Diagnosis not present

## 2021-06-08 DIAGNOSIS — M47817 Spondylosis without myelopathy or radiculopathy, lumbosacral region: Secondary | ICD-10-CM | POA: Diagnosis not present

## 2021-06-14 DIAGNOSIS — M47817 Spondylosis without myelopathy or radiculopathy, lumbosacral region: Secondary | ICD-10-CM | POA: Diagnosis not present

## 2021-06-14 DIAGNOSIS — M461 Sacroiliitis, not elsewhere classified: Secondary | ICD-10-CM | POA: Diagnosis not present

## 2021-06-16 DIAGNOSIS — M47817 Spondylosis without myelopathy or radiculopathy, lumbosacral region: Secondary | ICD-10-CM | POA: Diagnosis not present

## 2021-06-16 DIAGNOSIS — M461 Sacroiliitis, not elsewhere classified: Secondary | ICD-10-CM | POA: Diagnosis not present

## 2021-06-21 DIAGNOSIS — M461 Sacroiliitis, not elsewhere classified: Secondary | ICD-10-CM | POA: Diagnosis not present

## 2021-06-21 DIAGNOSIS — M47817 Spondylosis without myelopathy or radiculopathy, lumbosacral region: Secondary | ICD-10-CM | POA: Diagnosis not present

## 2021-06-24 DIAGNOSIS — M47817 Spondylosis without myelopathy or radiculopathy, lumbosacral region: Secondary | ICD-10-CM | POA: Diagnosis not present

## 2021-06-24 DIAGNOSIS — M461 Sacroiliitis, not elsewhere classified: Secondary | ICD-10-CM | POA: Diagnosis not present

## 2021-06-27 DIAGNOSIS — M47817 Spondylosis without myelopathy or radiculopathy, lumbosacral region: Secondary | ICD-10-CM | POA: Diagnosis not present

## 2021-06-27 DIAGNOSIS — M461 Sacroiliitis, not elsewhere classified: Secondary | ICD-10-CM | POA: Diagnosis not present

## 2021-06-29 ENCOUNTER — Ambulatory Visit (INDEPENDENT_AMBULATORY_CARE_PROVIDER_SITE_OTHER): Payer: PPO

## 2021-06-29 DIAGNOSIS — I442 Atrioventricular block, complete: Secondary | ICD-10-CM

## 2021-06-30 LAB — CUP PACEART REMOTE DEVICE CHECK
Battery Impedance: 2065 Ohm
Battery Remaining Longevity: 33 mo
Battery Voltage: 2.75 V
Brady Statistic AP VP Percent: 5 %
Brady Statistic AP VS Percent: 0 %
Brady Statistic AS VP Percent: 93 %
Brady Statistic AS VS Percent: 3 %
Date Time Interrogation Session: 20220817093605
Implantable Lead Implant Date: 20120302
Implantable Lead Implant Date: 20120302
Implantable Lead Location: 753859
Implantable Lead Location: 753860
Implantable Lead Model: 5076
Implantable Pulse Generator Implant Date: 20120302
Lead Channel Impedance Value: 517 Ohm
Lead Channel Impedance Value: 616 Ohm
Lead Channel Pacing Threshold Amplitude: 0.625 V
Lead Channel Pacing Threshold Amplitude: 0.625 V
Lead Channel Pacing Threshold Pulse Width: 0.4 ms
Lead Channel Pacing Threshold Pulse Width: 0.4 ms
Lead Channel Setting Pacing Amplitude: 2 V
Lead Channel Setting Pacing Amplitude: 2.5 V
Lead Channel Setting Pacing Pulse Width: 0.4 ms
Lead Channel Setting Sensing Sensitivity: 4 mV

## 2021-07-04 ENCOUNTER — Ambulatory Visit: Payer: PPO | Admitting: Physical Therapy

## 2021-07-04 ENCOUNTER — Other Ambulatory Visit: Payer: Self-pay

## 2021-07-04 DIAGNOSIS — M5441 Lumbago with sciatica, right side: Secondary | ICD-10-CM

## 2021-07-05 ENCOUNTER — Encounter: Payer: Self-pay | Admitting: Physical Therapy

## 2021-07-05 NOTE — Progress Notes (Signed)
I, [name], LAT, ATC, am serving as scribe for Dr. [name].  [name] is a 75 y.o. male who presents to [place] [facility] today for f/u of R-sided LBP and radiating pain into his R post calf.  He was last seen by Dr Georgina Snell on 05/12/21 and had a R SIJ injection.  He was also referred to PT and has completed 5 sessions in [place] w/ an additional referral placed to [facility] as he splits his time between these two locations.  Since his last visit, pt reports his LBP con't  and notes that " he's not there yet" w/ his back.  He feels that PT is helping somewhat.  He reports that he felt like he was making progress until Saturday when he had to use his cane again to walk.  The only thing he did differently was that he carried in a case of wine on Friday night.  He has been using ice.    Diagnostic imaging: R hip and L-spine XR- 05/11/21  Pertinent review of systems: No fevers or chills  Relevant historical information: Pacemaker.  Not MRI compatible.   Exam:  BP 112/68 (BP Location: Right Arm, Patient Position: Sitting, Cuff Size: Normal)   Pulse 72   Ht '6\' 5"'$  (1.956 m)   Wt 238 lb 3.2 oz (108 kg)   SpO2 96%   BMI 28.25 kg/m  General: Well Developed, well nourished, and in no acute distress.   MSK: L-spine moderate tender palpation right lumbar paraspinal musculature and SI joint.  Nontender midline.  Decreased lumbar motion.    Lab and Radiology Results EXAM: LUMBAR SPINE - COMPLETE 4+ VIEW   COMPARISON:  None.   FINDINGS: Frontal, lateral, spot lumbosacral lateral, and bilateral oblique views were obtained. There are 5 non-rib-bearing lumbar type vertebral bodies. There is slight lumbar levoscoliosis. There is no fracture. There is 2 mm of retrolisthesis of L2 on L3. There is 5 mm of retrolisthesis of L3 on L4. There is 2 mm of retrolisthesis of L4 on L5. There is 2 mm of anterolisthesis of L5 on S1. There is moderately severe  disc space narrowing at L1-2 with moderate disc space narrowing at all other levels. There is facet hypertrophy at all levels bilaterally with facet osteoarthritic change most notable at L5-S1 bilaterally. There is aortic atherosclerosis.   IMPRESSION: Multilevel osteoarthritic change. No acute fracture. Spondylolisthesis at several levels is likely due to underlying spondylosis. Slight scoliosis.   Aortic Atherosclerosis (ICD10-I70.0).     Electronically Signed   By: Lowella Grip III M.D.   On: 05/12/2021 10:55   EXAM: DG HIP (WITH OR WITHOUT PELVIS) 2-3V RIGHT   COMPARISON:  None.   FINDINGS: Frontal pelvis as well as frontal and lateral right hip images were obtained. No evident fracture or dislocation. There is moderate symmetric narrowing of each hip joint. There is rather marked bony overgrowth along the superolateral aspect of the right acetabulum. No erosive change. There are multiple foci of calcification in the iliac and femoral arteries bilaterally.   IMPRESSION: Moderate symmetric narrowing of each hip joint. Rather marked bony overgrowth along the superolateral right acetabulum, a finding that places patient at increased risk for potential femoroacetabular impingement.   No acute fracture or dislocation. Multiple foci of atherosclerotic arterial vascular calcification.     Electronically Signed   By: Lowella Grip III M.D.   On: 05/12/2021 10:56    I, [name], personally (  independently) visualized and performed the interpretation of the images attached in this note.        Assessment and Plan: 75 y.o. male with right SI joint/low right low back pain.  Patient had marginal immediate benefit to SI joint injection at the last visit indicating that his pain generator is possibly at the SI joint but probably located elsewhere.  Fortunately the steroid component of the SI joint injection did provide some benefit which has worked with physical  therapy to overall improve his quality of life and ability to function and reduced his pain.  However he still has pain in the right with low back/SI joint area.  We will proceed to next step evaluation which is advanced three-dimensional imaging of the lumbar spine.  Ideally this should be MRI but he has a pacemaker which is not MRI safe so we will proceed to CT myelogram.  This is for facet injection planning for potential future epidural steroid injection planning.  Following CT myelogram recheck probably on or after September 30 so I can do a repeat SI joint injection if needed.  Anticipate ordering facet joint injections at right L4-L5 and L5-S1 but was wait on results of CT scan.   PDMP not reviewed this encounter. Orders Placed This Encounter  Procedures   CT LUMBAR SPINE W CONTRAST    No IV contrast. CT myelogram only    Standing Status:   Future    Standing Expiration Date:   07/06/2022    Scheduling Instructions:     No IV contrast. CT myelogram only    Order Specific Question:   If indicated for the ordered procedure, I authorize the administration of contrast media per Radiology protocol    Answer:   Yes    Order Specific Question:   Preferred imaging location?    Answer:   [facility]   DG Myelogram Lumbar    Standing Status:   Future    Standing Expiration Date:   07/06/2022    Order Specific Question:   If indicated for the ordered procedure, I authorize the administration of contrast media per Radiology protocol    Answer:   Yes    Order Specific Question:   Reason for Exam (SYMPTOM  OR DIAGNOSIS REQUIRED)    Answer:   Eval low back pain. with Ct scan    Order Specific Question:   Preferred Imaging Location?    Answer:   [facility]   No orders of the defined types were placed in this encounter.    Discussed warning signs or symptoms. Please see discharge instructions. Patient expresses understanding.   The above documentation has been reviewed and  is accurate and complete [name], M.D.

## 2021-07-05 NOTE — Therapy (Signed)
[facility] [address] [place], [place], [address] Phone: [phone]   Fax:  [phone]  Physical Therapy Treatment/Re-Cert   Patient Details  Name: [name] MRN: [id number] Date of Birth: 1945/11/24 Referring Provider (PT): [name]   Encounter Date: 07/04/2021   PT End of Session - 07/05/21 1101     Visit Number 5    Number of Visits 12    Date for PT Re-Evaluation 08/15/21    Authorization Type HTA    PT Start Time 1515    PT Stop Time 1600    PT Time Calculation (min) 45 min    Activity Tolerance Patient tolerated treatment well    Behavior During Therapy [facility] for tasks assessed/performed             Past Medical History:  Diagnosis Date   Complete heart block (HCC)    S/P  PPM by Dr Rayann Heman   Coronary artery disease    Non-obstructive. Pt had left heart catheterization in August 2009 showing a 30% mid-circumflex lesion, otherwise coronaries were clean angiographically   Hyperlipidemia    Hypertension    OSA (obstructive sleep apnea)    Probable; study never scheduled    Past Surgical History:  Procedure Laterality Date   [place], 2009   2009- negative   COLONOSCOPY  2011   negative, [facility]   PACEMAKER INSERTION  01/13/11   MDT by Greggory Brandy for complete heart block    There were no vitals filed for this visit.   Subjective Assessment - 07/04/21 1528     Subjective Pt last seen 7/18. [place] and [place] been able to get PT there as well. Pt states he is doing better, but still "not there yet". Still having bothersome pain in the mornings, and did have quite a bit of pain on Saturday this weekend. Pain with walking and pain down R Leg is some better.    Currently in Pain? Yes    Pain Score 3     Pain Location Back    Pain Orientation Right    Pain Descriptors / Indicators Aching    Pain Type Acute pain    Pain Onset More than a month ago    Pain  Frequency Intermittent                [facility] PT Assessment - 07/05/21 0001       AROM   Overall AROM Comments Lumbar: mil defictis for flex, mod deficit for ext. Hips: mild deficit for flex and rotation bilaterally      Strength   Overall Strength Comments hips: 4+/5      Palpation   Palpation comment Pain in R low lumbar region, into R SI, and R glute min, piriformis. Hypomobile lumbar spine                           [facility] Adult PT Treatment/Exercise - 07/05/21 0001       Exercises   Exercises Lumbar      Lumbar Exercises: Stretches   Active Hamstring Stretch 3 reps;30 seconds    Active Hamstring Stretch Limitations seated    Lower Trunk Rotation 5 reps;10 seconds      Lumbar Exercises: Aerobic   Recumbent Bike L 2 x 6  min;      Lumbar Exercises: Seated   Sit to Stand 10 reps  Lumbar Exercises: Supine   Clam 20 reps    Clam Limitations Bl TB with TA    Bridge 20 reps    Straight Leg Raises Limitations pain on R.      Manual Therapy   Manual Therapy Joint mobilization;Passive ROM;Manual Traction;Soft tissue mobilization    Manual therapy comments skilled palpation and monitoring of soft tissue with dry needling    Soft tissue mobilization DTM/ TPR to R low lumbar,  SI, and R glute min, piriformis    Manual Traction long leg distaction for lumbar pump x 2 min bil;                    PT Education - 07/05/21 1101     Education Details Reviewed HEP.    Person(s) Educated Patient    Methods Explanation;Demonstration;Tactile cues;Verbal cues;Handout    Comprehension Verbalized understanding;Returned demonstration;Verbal cues required;Tactile cues required;Need further instruction              PT Short Term Goals - 07/05/21 1102       PT SHORT TERM GOAL #1   Title Pt to be independent with initial HEP    Time 2    Status Achieved    Target Date 05/31/21               PT Long Term Goals - 07/05/21 1102        PT LONG TERM GOAL #1   Title Pt to be independent with final HEP    Time 6    Period Weeks    Status Partially Met    Target Date 08/15/21      PT LONG TERM GOAL #2   Title Pt to report ability for standing, walking, for at least 30 min with pain in lumbar spine and R LE decreased , to 0-2/10    Time 6    Period Weeks    Status Partially Met    Target Date 08/15/21      PT LONG TERM GOAL #3   Title Pt to demo improved lumbar ROM for flexion to have only minimal deficit, to improve ability for ADLs and IADLS.    Time 6    Period Weeks    Status Partially Met    Target Date 08/15/21      PT LONG TERM GOAL #4   Title Pt to demo optimal posture for bend, lift, squat mechanics, to improve pain and safety with IADLs.    Time 6    Period Weeks    Status Partially Met    Target Date 08/15/21                   Plan - 07/05/21 1105     Clinical Impression Statement Pt has been seen for 5 visits. He is doing well with HEP and progressive strengthening exercises. Pt continues to have pain in R SI first thing in AM, and with increased activity. He is able to stand/walk a bit longer without pain than he did previously. Pt will benefit from continued care to decrease pain, increase strength, and to improve ability for functional activity. Pt going out of town to his other house, and will f/u when he gets back. HEP reviewed today.    Examination-Activity Limitations Lift;Stand;Locomotion Level;Carry;Squat;Bend    Examination-Participation Restrictions Cleaning;Meal Prep;Yard Work;Community Activity    Stability/Clinical Decision Making Stable/Uncomplicated    Rehab Potential Good    PT Frequency 2x / week    PT  Duration 6 weeks    PT Treatment/Interventions ADLs/Self Care Home Management;Canalith Repostioning;Cryotherapy;DME Instruction;Traction;Moist Heat;Gait training;Stair training;Functional mobility training;Therapeutic activities;Therapeutic exercise;Balance  training;Patient/family education;Neuromuscular re-education;Manual techniques;Taping;Dry needling;Passive range of motion;Spinal Manipulations;Joint Manipulations    PT Home Exercise Plan [facility]    Consulted and Agree with Plan of Care Patient             Patient will benefit from skilled therapeutic intervention in order to improve the following deficits and impairments:  Pain, Improper body mechanics, Decreased mobility, Hypomobility, Decreased strength, Decreased range of motion, Decreased activity tolerance, Impaired flexibility  Visit Diagnosis: Acute right-sided low back pain with right-sided sciatica     Problem List Patient Active Problem List   Diagnosis Date Noted   Chronic right SI joint pain 16/08/9603   Umbilical hernia without obstruction or gangrene 02/02/2021   Hepatic steatosis 08/05/2020   Aortic atherosclerosis ([place]) 08/04/2020   Complex regional pain syndrome type I of the upper limb 07/26/2020   Flexion contracture of joint of hand 07/26/2020   Pacemaker 07/26/2020   Rib pain on left side 06/09/2020   Decreased pulses in feet 06/09/2020   Erectile dysfunction 03/03/2017   Chronic headaches 02/13/2017   Snoring 02/15/2015   Elevated liver enzymes 09/13/2014   Dupuytren's contracture of both hands 09/02/2013   Stiffness of right hand joint 05/07/2013   Skin tear of hand without complication 54/07/8118   History of gout 12/27/2011   Essential hypertension 01/12/2011   Complete heart block ([place]) 01/12/2011   Hyperglycemia 03/02/2009   HYPERPLASIA PROSTATE UNS W/O UR OBST & OTH LUTS 08/13/2008   Hyperlipidemia 02/17/2008   PAROXYSMAL SUPRAVENTRICULAR TACHYCARDIA 02/17/2008   Lyndee Hensen, PT, DPT 11:29 AM  07/05/21    [facility] [address] [place], [place], [address] Phone: [phone]   Fax:  [phone]  Name: EINAR NOLASCO MRN: [id number] Date of Birth: September 23, 1946

## 2021-07-06 ENCOUNTER — Encounter: Payer: Self-pay | Admitting: Family Medicine

## 2021-07-06 ENCOUNTER — Ambulatory Visit: Payer: PPO | Admitting: Family Medicine

## 2021-07-06 ENCOUNTER — Other Ambulatory Visit: Payer: Self-pay

## 2021-07-06 VITALS — BP 112/68 | HR 72 | Ht 77.0 in | Wt 238.2 lb

## 2021-07-06 DIAGNOSIS — G8929 Other chronic pain: Secondary | ICD-10-CM

## 2021-07-06 DIAGNOSIS — M533 Sacrococcygeal disorders, not elsewhere classified: Secondary | ICD-10-CM

## 2021-07-06 DIAGNOSIS — M5441 Lumbago with sciatica, right side: Secondary | ICD-10-CM | POA: Diagnosis not present

## 2021-07-06 NOTE — Patient Instructions (Addendum)
Thank you for coming in today.   Plan for CT myelogram.   Recheck after the test or on or after Sep 30 which would be 3 months after the last injection so I can do an injection.

## 2021-07-09 ENCOUNTER — Other Ambulatory Visit: Payer: Self-pay | Admitting: Internal Medicine

## 2021-07-12 DIAGNOSIS — M461 Sacroiliitis, not elsewhere classified: Secondary | ICD-10-CM | POA: Diagnosis not present

## 2021-07-12 DIAGNOSIS — M47817 Spondylosis without myelopathy or radiculopathy, lumbosacral region: Secondary | ICD-10-CM | POA: Diagnosis not present

## 2021-07-13 DIAGNOSIS — M47817 Spondylosis without myelopathy or radiculopathy, lumbosacral region: Secondary | ICD-10-CM | POA: Diagnosis not present

## 2021-07-13 DIAGNOSIS — M461 Sacroiliitis, not elsewhere classified: Secondary | ICD-10-CM | POA: Diagnosis not present

## 2021-07-19 NOTE — Progress Notes (Signed)
Remote pacemaker transmission.   

## 2021-07-21 DIAGNOSIS — M461 Sacroiliitis, not elsewhere classified: Secondary | ICD-10-CM | POA: Diagnosis not present

## 2021-07-21 DIAGNOSIS — M47817 Spondylosis without myelopathy or radiculopathy, lumbosacral region: Secondary | ICD-10-CM | POA: Diagnosis not present

## 2021-07-22 DIAGNOSIS — M461 Sacroiliitis, not elsewhere classified: Secondary | ICD-10-CM | POA: Diagnosis not present

## 2021-07-22 DIAGNOSIS — M47817 Spondylosis without myelopathy or radiculopathy, lumbosacral region: Secondary | ICD-10-CM | POA: Diagnosis not present

## 2021-07-26 DIAGNOSIS — M47817 Spondylosis without myelopathy or radiculopathy, lumbosacral region: Secondary | ICD-10-CM | POA: Diagnosis not present

## 2021-07-26 DIAGNOSIS — M461 Sacroiliitis, not elsewhere classified: Secondary | ICD-10-CM | POA: Diagnosis not present

## 2021-07-28 DIAGNOSIS — M47817 Spondylosis without myelopathy or radiculopathy, lumbosacral region: Secondary | ICD-10-CM | POA: Diagnosis not present

## 2021-07-28 DIAGNOSIS — M461 Sacroiliitis, not elsewhere classified: Secondary | ICD-10-CM | POA: Diagnosis not present

## 2021-08-03 ENCOUNTER — Other Ambulatory Visit: Payer: Self-pay

## 2021-08-03 NOTE — Progress Notes (Signed)
Subjective:    Patient ID: [name], male    DOB: February 14, 1946, 75 y.o.   MRN: [id number]  This visit occurred during the SARS-CoV-2 public health emergency.  Safety protocols were in place, including screening questions prior to the visit, additional usage of staff PPE, and extensive cleaning of exam room while observing appropriate contact time as indicated for disinfecting solutions.     HPI The patient is here for follow up of their chronic medical problems, including htn, hichol, hyperglycemia, gout, chronic HAs, knee pain, right hip pain, R lower back pain ( Dr Georgina Snell)  Going for an myelogram for his lower back pain.  PT and an injection has not helped.  Ice helps.  He has to ice it first thing in the morning and then it is ok the rest of the day.    Medications and allergies reviewed with patient and updated if appropriate.  Patient Active Problem List   Diagnosis Date Noted   Chronic right SI joint pain 03/50/0938   Umbilical hernia without obstruction or gangrene 02/02/2021   Hepatic steatosis 08/05/2020   Aortic atherosclerosis ([place]) 08/04/2020   Complex regional pain syndrome type I of the upper limb 07/26/2020   Flexion contracture of joint of hand 07/26/2020   Pacemaker 07/26/2020   Rib pain on left side 06/09/2020   Decreased pulses in feet 06/09/2020   Erectile dysfunction 03/03/2017   Chronic headaches 02/13/2017   Snoring 02/15/2015   Elevated liver enzymes 09/13/2014   Dupuytren's contracture of both hands 09/02/2013   Stiffness of right hand joint 05/07/2013   Skin tear of hand without complication 18/29/9371   History of gout 12/27/2011   Essential hypertension 01/12/2011   Complete heart block ([place]) 01/12/2011   Hyperglycemia 03/02/2009   HYPERPLASIA PROSTATE UNS W/O UR OBST & OTH LUTS 08/13/2008   Hyperlipidemia 02/17/2008   PAROXYSMAL SUPRAVENTRICULAR TACHYCARDIA 02/17/2008    Current Outpatient Medications on File Prior to Visit   Medication Sig Dispense Refill   allopurinol (ZYLOPRIM) 300 MG tablet TAKE 0.5 TABLETS BY MOUTH DAILY. 45 tablet 1   clobetasol (TEMOVATE) 0.05 % cream Apply 1 application topically daily as needed (itching).     COVID-19 mRNA Vac-TriS, Pfizer, (PFIZER-BIONT COVID-19 VAC-TRIS) SUSP injection Inject into the muscle. 0.3 mL 0   fish oil-omega-3 fatty acids 1000 MG capsule Take 1 g by mouth 2 (two) times daily.     fluorouracil (EFUDEX) 5 % cream Apply 1 application topically daily.     gabapentin (NEURONTIN) 300 MG capsule TAKE 1 CAPSULE BY MOUTH EVERYDAY AT BEDTIME 90 capsule 1   ibuprofen (ADVIL,MOTRIN) 200 MG tablet Take 200 mg by mouth daily.     losartan (COZAAR) 100 MG tablet Take 1 tablet (100 mg total) by mouth daily. 90 tablet 1   sildenafil (REVATIO) 20 MG tablet TAKE 3 TO 5 TABLETS BY MOUTH AS NEEDED 100 tablet 0   simvastatin (ZOCOR) 40 MG tablet TAKE 1 TABLET BY MOUTH DAILY AT 6 PM. 90 tablet 1   No current facility-administered medications on file prior to visit.    Past Medical History:  Diagnosis Date   Complete heart block (H[facility])    S/P  PPM by Dr Rayann Heman   Coronary artery disease    Non-obstructive. Pt had left heart catheterization in August 2009 showing a 30% mid-circumflex lesion, otherwise coronaries were clean angiographically   Hyperlipidemia    Hypertension    OSA (obstructive sleep apnea)    Probable;  study never scheduled    Past Surgical History:  Procedure Laterality Date   [place], 2009   2009- negative   COLONOSCOPY  2011   negative, [facility]   PACEMAKER INSERTION  01/13/11   MDT by Greggory Brandy for complete heart block    Social History   Socioeconomic History   Marital status: Married    Spouse name: Not on file   Number of children: Not on file   Years of education: Not on file   Highest education level: Not on file  Occupational History   Not on file  Tobacco Use   Smoking status: Former    Types: Cigarettes    Quit date:  11/13/2005    Years since quitting: 15.7   Smokeless tobacco: Never   Tobacco comments:    smoked 1965- 2007, up to 2 ppd  Vaping Use   Vaping Use: Never used  Substance and Sexual Activity   Alcohol use: Yes    Comment:  21 drinks/ week   Drug use: No   Sexual activity: Not on file  Other Topics Concern   Not on file  Social History Narrative   Exercise: regularly - golf and walks - does both a few times a week   Social Determinants of Health   Financial Resource Strain: Not on file  Food Insecurity: Not on file  Transportation Needs: Not on file  Physical Activity: Not on file  Stress: Not on file  Social Connections: Not on file    Family History  Problem Relation Age of Onset   Heart attack Maternal Grandfather 90   Diabetes Father    Peripheral vascular disease Mother    Cancer Mother        ? primary   Arrhythmia Mother        AFIB   Gout Brother    Leukemia Brother    Other Sister        prediabetic   Stroke Neg Hx     Review of Systems  Constitutional:  Negative for chills and fever.  Respiratory:  Negative for cough, shortness of breath and wheezing.   Cardiovascular:  Negative for chest pain, palpitations and leg swelling.  Musculoskeletal:  Positive for back pain.  Neurological:  Positive for light-headedness (occ if get up too quick) and headaches (chronic). Negative for dizziness.      Objective:  There were no vitals filed for this visit. BP Readings from Last 3 Encounters:  07/06/21 112/68  05/12/21 102/68  05/11/21 140/82   Wt Readings from Last 3 Encounters:  07/06/21 238 lb 3.2 oz (108 kg)  05/12/21 241 lb 3.2 oz (109.4 kg)  05/11/21 240 lb (108.9 kg)   There is no height or weight on file to calculate BMI.   Physical Exam    Constitutional: Appears well-developed and well-nourished. No distress.  HENT:  Head: Normocephalic and atraumatic.  Neck: Neck supple. No tracheal deviation present. No thyromegaly present.  No cervical  lymphadenopathy Cardiovascular: Normal rate, regular rhythm and normal heart sounds.   No murmur heard. No carotid bruit .  No edema Pulmonary/Chest: Effort normal and breath sounds normal. No respiratory distress. No has no wheezes. No rales.  Skin: Skin is warm and dry. Not diaphoretic.  Psychiatric: Normal mood and affect. Behavior is normal.      Assessment & Plan:    See Problem List for Assessment and Plan of chronic medical problems.

## 2021-08-03 NOTE — Patient Instructions (Addendum)
    Blood work was ordered.     Flu immunization administered today.     Medications changes include :   none    Please followup in 6 months

## 2021-08-04 ENCOUNTER — Encounter: Payer: Self-pay | Admitting: Internal Medicine

## 2021-08-04 ENCOUNTER — Ambulatory Visit (INDEPENDENT_AMBULATORY_CARE_PROVIDER_SITE_OTHER): Payer: PPO | Admitting: Internal Medicine

## 2021-08-04 VITALS — BP 126/72 | HR 73 | Temp 98.0°F | Ht 77.0 in | Wt 238.0 lb

## 2021-08-04 DIAGNOSIS — Z23 Encounter for immunization: Secondary | ICD-10-CM

## 2021-08-04 DIAGNOSIS — R739 Hyperglycemia, unspecified: Secondary | ICD-10-CM

## 2021-08-04 DIAGNOSIS — R519 Headache, unspecified: Secondary | ICD-10-CM | POA: Diagnosis not present

## 2021-08-04 DIAGNOSIS — E7849 Other hyperlipidemia: Secondary | ICD-10-CM

## 2021-08-04 DIAGNOSIS — Z8739 Personal history of other diseases of the musculoskeletal system and connective tissue: Secondary | ICD-10-CM | POA: Diagnosis not present

## 2021-08-04 DIAGNOSIS — G8929 Other chronic pain: Secondary | ICD-10-CM

## 2021-08-04 DIAGNOSIS — I10 Essential (primary) hypertension: Secondary | ICD-10-CM | POA: Diagnosis not present

## 2021-08-04 LAB — CBC WITH DIFFERENTIAL/PLATELET
Basophils Absolute: 0.1 10*3/uL (ref 0.0–0.1)
Basophils Relative: 1.2 % (ref 0.0–3.0)
Eosinophils Absolute: 0.3 10*3/uL (ref 0.0–0.7)
Eosinophils Relative: 5.7 % — ABNORMAL HIGH (ref 0.0–5.0)
HCT: 43.8 % (ref 39.0–52.0)
Hemoglobin: 14.8 g/dL (ref 13.0–17.0)
Lymphocytes Relative: 42.2 % (ref 12.0–46.0)
Lymphs Abs: 1.9 10*3/uL (ref 0.7–4.0)
MCHC: 33.8 g/dL (ref 30.0–36.0)
MCV: 104.7 fl — ABNORMAL HIGH (ref 78.0–100.0)
Monocytes Absolute: 0.4 10*3/uL (ref 0.1–1.0)
Monocytes Relative: 8.5 % (ref 3.0–12.0)
Neutro Abs: 1.9 10*3/uL (ref 1.4–7.7)
Neutrophils Relative %: 42.4 % — ABNORMAL LOW (ref 43.0–77.0)
Platelets: 143 10*3/uL — ABNORMAL LOW (ref 150.0–400.0)
RBC: 4.19 Mil/uL — ABNORMAL LOW (ref 4.22–5.81)
RDW: 14.4 % (ref 11.5–15.5)
WBC: 4.5 10*3/uL (ref 4.0–10.5)

## 2021-08-04 LAB — LIPID PANEL
Cholesterol: 177 mg/dL (ref 0–200)
HDL: 59.9 mg/dL (ref >39.00)
LDL Cholesterol: 86 mg/dL (ref 0–99)
NonHDL: 117.18
Total CHOL/HDL Ratio: 3
Triglycerides: 154 mg/dL — ABNORMAL HIGH (ref 0.0–149.0)
VLDL: 30.8 mg/dL (ref 0.0–40.0)

## 2021-08-04 LAB — COMPREHENSIVE METABOLIC PANEL
ALT: 50 U/L (ref 0–53)
AST: 46 U/L — ABNORMAL HIGH (ref 0–37)
Albumin: 4.6 g/dL (ref 3.5–5.2)
Alkaline Phosphatase: 39 U/L (ref 39–117)
BUN: 20 mg/dL (ref 6–23)
CO2: 26 mEq/L (ref 19–32)
Calcium: 10 mg/dL (ref 8.4–10.5)
Chloride: 102 mEq/L (ref 96–112)
Creatinine, Ser: 1.24 mg/dL (ref 0.40–1.50)
GFR: 57.64 mL/min — ABNORMAL LOW (ref >60.00)
Glucose, Bld: 96 mg/dL (ref 70–99)
Potassium: 4.2 mEq/L (ref 3.5–5.1)
Sodium: 138 mEq/L (ref 135–145)
Total Bilirubin: 0.8 mg/dL (ref 0.2–1.2)
Total Protein: 7.3 g/dL (ref 6.0–8.3)

## 2021-08-04 LAB — HEMOGLOBIN A1C: Hgb A1c MFr Bld: 5.4 % (ref 4.6–6.5)

## 2021-08-04 NOTE — Assessment & Plan Note (Signed)
Chronic BP well controlled Continue losartan 100 mg qd cmp

## 2021-08-04 NOTE — Assessment & Plan Note (Signed)
Chronic Check lipid panel  Continue simvastatin 40 mg daily Regular exercise and healthy diet encouraged  

## 2021-08-04 NOTE — Assessment & Plan Note (Signed)
Chronic Check a1c Low sugar / carb diet Stressed regular exercise  

## 2021-08-04 NOTE — Assessment & Plan Note (Signed)
Chronic Fairly controlled Continue gabapentin 300 mg bedtime Also take ibuprofen daily

## 2021-08-04 NOTE — Addendum Note (Signed)
Addended by: Boris Lown B on: 08/04/2021 09:16 AM   Modules accepted: Orders

## 2021-08-04 NOTE — Assessment & Plan Note (Signed)
Chronic Controlled No gout symptoms since last visit Continue allopurinol 150 mg daily

## 2021-08-04 NOTE — Addendum Note (Signed)
Addended by: Marcina Millard on: 08/04/2021 09:16 AM   Modules accepted: Orders

## 2021-08-05 ENCOUNTER — Other Ambulatory Visit: Payer: Self-pay

## 2021-08-05 ENCOUNTER — Encounter: Payer: Self-pay | Admitting: Physical Therapy

## 2021-08-05 ENCOUNTER — Ambulatory Visit: Payer: PPO | Admitting: Physical Therapy

## 2021-08-05 DIAGNOSIS — M5441 Lumbago with sciatica, right side: Secondary | ICD-10-CM

## 2021-08-07 ENCOUNTER — Other Ambulatory Visit: Payer: Self-pay | Admitting: Internal Medicine

## 2021-08-10 ENCOUNTER — Ambulatory Visit
Admission: RE | Admit: 2021-08-10 | Discharge: 2021-08-10 | Disposition: A | Discharge status: Home / Self Care | Payer: PPO | Source: Ambulatory Visit | Attending: Family Medicine | Admitting: Family Medicine

## 2021-08-10 ENCOUNTER — Other Ambulatory Visit: Payer: Self-pay

## 2021-08-10 DIAGNOSIS — M4316 Spondylolisthesis, lumbar region: Secondary | ICD-10-CM | POA: Diagnosis not present

## 2021-08-10 DIAGNOSIS — G8929 Other chronic pain: Secondary | ICD-10-CM

## 2021-08-10 DIAGNOSIS — M48061 Spinal stenosis, lumbar region without neurogenic claudication: Secondary | ICD-10-CM | POA: Diagnosis not present

## 2021-08-10 DIAGNOSIS — M5126 Other intervertebral disc displacement, lumbar region: Secondary | ICD-10-CM | POA: Diagnosis not present

## 2021-08-10 MED ORDER — MEPERIDINE HCL 50 MG/ML IJ SOLN: 50.0000 mg | Freq: Once | INTRAMUSCULAR | Status: DC | PRN

## 2021-08-10 MED ORDER — ONDANSETRON HCL 4 MG/2ML IJ SOLN: 4.0000 mg | Freq: Once | INTRAMUSCULAR | Status: DC | PRN

## 2021-08-10 MED ORDER — IOPAMIDOL (ISOVUE-M 200) INJECTION 41%
20.0000 mL | Freq: Once | INTRAMUSCULAR | Status: AC
  Administered 2021-08-10: 20 mL via INTRATHECAL

## 2021-08-10 MED ORDER — DIAZEPAM 5 MG PO TABS
5.0000 mg | ORAL_TABLET | Freq: Once | ORAL | Status: AC
  Administered 2021-08-10: 5 mg via ORAL

## 2021-08-10 NOTE — Discharge Instructions (Signed)

## 2021-08-11 ENCOUNTER — Encounter: Payer: PPO | Admitting: Physical Therapy

## 2021-08-11 NOTE — Progress Notes (Signed)
CT myelogram lumbar spine shows areas of potential pinched nerve and significant facet arthritis that could produce back pain and potentially leg pain.  You are scheduled to follow-up with me on October 3 to go over this in full detail and potentially set up for injections.  We will talk about this in full detail at that visit.  I think this is a helpful study.

## 2021-08-15 ENCOUNTER — Other Ambulatory Visit: Payer: Self-pay

## 2021-08-15 ENCOUNTER — Ambulatory Visit (INDEPENDENT_AMBULATORY_CARE_PROVIDER_SITE_OTHER): Payer: PPO | Admitting: Family Medicine

## 2021-08-15 VITALS — BP 140/88 | HR 81 | Ht 77.0 in | Wt 239.6 lb

## 2021-08-15 DIAGNOSIS — M5441 Lumbago with sciatica, right side: Secondary | ICD-10-CM

## 2021-08-15 DIAGNOSIS — K760 Fatty (change of) liver, not elsewhere classified: Secondary | ICD-10-CM | POA: Diagnosis not present

## 2021-08-15 DIAGNOSIS — G8929 Other chronic pain: Secondary | ICD-10-CM | POA: Diagnosis not present

## 2021-08-15 NOTE — Progress Notes (Signed)
I, [name], LAT, ATC acting as a scribe for [name], MD.  [name] is a 75 y.o. male who presents to [place] [facility] today for f/u of R-sided SIJ/LBP and radiating pain into his R post calf.  He was last seen by Dr Georgina Snell on 07/06/21 and was advised to proceed to CT myelogram. Today, pt reports he has been feeling about the same, some days worse than others. Pt notes pain is worse first thing in the mornings. Pain is still radiating into R calf.  Dx imaging: 08/10/21 L-spine CT myelogram  05/11/21 R hip & L-spine XR   Pertinent review of systems: No fevers or chills  Relevant historical information: Mild elevated liver enzymes   Exam:  BP 140/88   Pulse 81   Ht 6\' 5"  (1.956 m)   Wt 239 lb 9.6 oz (108.7 kg)   SpO2 96%   BMI 28.41 kg/m  General: Well Developed, well nourished, and in no acute distress.   MSK: L-spine nontender midline.  Decreased lumbar motion.    Lab and Radiology Results  EXAM: LUMBAR MYELOGRAM   FLUOROSCOPY TIME:  Fluoroscopy Time: 48 seconds   Radiation Exposure Index: 381.25 microGray*m^2   PROCEDURE: After thorough discussion of risks and benefits of the procedure including bleeding, infection, injury to nerves, blood vessels, adjacent structures as well as headache and CSF leak, written and oral informed consent was obtained. Consent was obtained by Durenda Guthrie, PA. Time out form was completed.   Patient was positioned prone on the fluoroscopy table. Local anesthesia was provided with 1% lidocaine without epinephrine after prepped and draped in the usual sterile fashion. Puncture was performed at L5-S1 using a 3 1/2 inch 22-gauge spinal needle via a left interlaminar approach. Using a single pass through the dura, the needle was placed within the thecal sac, with return of clear CSF. 15 mL of Isovue M-200 was injected into the thecal sac, with normal opacification of the nerve roots and cauda equina  consistent with free flow within the subarachnoid space.   Aimee Han, PA performed the lumbar puncture and administered the intrathecal contrast under my direct supervision. I also personally supervised acquisition of the myelogram images.   TECHNIQUE: Contiguous axial images were obtained through the Lumbar spine after the intrathecal infusion of contrast. Coronal and sagittal reconstructions were obtained of the axial image sets.   COMPARISON:  Lumbar spine radiographs 05/11/2021   FINDINGS: LUMBAR MYELOGRAM FINDINGS:   There are 5 non rib-bearing lumbar type vertebrae. Grade 1 retrolisthesis of L2 on L3, L3 on L4, and L4 on L5 does not change with flexion or extension. There are ventral extradural defects at these levels with mild-to-moderate spinal stenosis at L3-4 and mild spinal stenosis at L4-5. There is evidence of asymmetric right lateral recess stenosis at L3-4 partially effacing the right L4 nerve root.   CT LUMBAR MYELOGRAM FINDINGS:   Grade 1 retrolisthesis of L2 on L3, L3 on L4, and L4 on L5 measures 4 mm each. No acute fracture or suspicious osseous lesion is identified. Scattered small Schmorl's nodes are noted. Disc space narrowing is present throughout the lumbar spine and is moderate to severe at L1-2 and L3-4. The conus medullaris terminates at L1. There is extensive abdominal aortic atherosclerosis without aneurysm. The visualized portion of the liver is hypoattenuating suggesting steatosis.   T12-L1: Moderate left facet arthrosis without stenosis.   L1-2: Disc bulging and endplate spurring without stenosis.   L2-3:  Retrolisthesis with bulging uncovered disc and mild facet and ligamentum flavum hypertrophy result in mild spinal stenosis and mild left lateral recess stenosis without neural foraminal stenosis.   L3-4: Retrolisthesis with bulging uncovered disc, endplate spurring, prominent dorsal epidural fat, and moderate facet and ligamentum flavum  hypertrophy result in moderate spinal stenosis, moderate right greater than left lateral recess stenosis, and severe right and mild left neural foraminal stenosis. Potential right L3 and L4 nerve root impingement.   L4-5: Retrolisthesis with bulging uncovered disc, endplate spurring, and moderate facet and ligamentum flavum hypertrophy result in mild left lateral recess stenosis and moderate left greater than right neural foraminal stenosis without significant spinal stenosis.   L5-S1: Disc bulging, endplate spurring, and severe facet and ligamentum flavum hypertrophy result in mild left greater than right neural foraminal stenosis without spinal stenosis.   IMPRESSION: 1. Advanced lumbar disc and facet degeneration, greatest at L3-4 where there is moderate spinal stenosis and severe right neural stenosis. 2. Moderate bilateral neural foraminal stenosis at L4-5. 3. Mild spinal stenosis at L2-3. 4. Aortic Atherosclerosis (ICD10-I70.0).     Electronically Signed   By: Logan Bores M.D.   On: 08/10/2021 15:05   I, [name], personally (independently) visualized and performed the interpretation of the images attached in this note.      Assessment and Plan: 75 y.o. male with lumbar radiculopathy right calf thought to be L5.  After discussion with patient proceed to epidural steroid injection likely at the L4-L5 level.  Patient does have some back pain but this seems to be a lesser issue.  If needed could target the facet DJD at L3-4.  Additionally patient incidentally had some potential for liver steatosis on CT scan.  Upon review of his liver enzymes he does have mild transaminitis.  Will discuss with PCP.  The likely next step should be liver ultrasound.  Will discuss with PCP and plan for next steps.   PDMP not reviewed this encounter. Orders Placed This Encounter  Procedures   DG INJECT DIAG/THERA/INC NEEDLE/CATH/PLC EPI/LUMB/SAC W/IMG    LUMB EPI #1 RT L5  HTA-no auth req  per online//js  238 LBS  PACS (08/10/21) NO THINS/OTC  *SCREENED*    Standing Status:   Future    Standing Expiration Date:   08/15/2022    Order Specific Question:   Reason for Exam (SYMPTOM  OR DIAGNOSIS REQUIRED)    Answer:   Rt L5 symptoms. Level and tchnique per radiology.    Order Specific Question:   Preferred Imaging Location?    Answer:   [id number]    Order Specific Question:   Radiology Contrast Protocol - do NOT remove file path    Answer:   [email or web address]   Ambulatory referral to Physical Therapy    Referral Priority:   Routine    Referral Type:   Physical Medicine    Referral Reason:   Specialty Services Required    Requested Specialty:   Physical Therapy    Number of Visits Requested:   1   No orders of the defined types were placed in this encounter.    Discussed warning signs or symptoms. Please see discharge instructions. Patient expresses understanding.   The above documentation has been reviewed and is accurate and complete [name], M.D.

## 2021-08-15 NOTE — Patient Instructions (Addendum)
Thank you for coming in today.   Please call [facility] at [phone] to schedule your spine injection.    Let me know how you feel after the shot.   It will be very similar to the last myelogram shot you had.   Epidural Steroid Injection An epidural steroid injection is a shot of steroid medicine and numbing medicine that is given into the space between the spinal cord and the bones of the back (epidural space). The shot helps relieve pain caused by an irritated or swollen nerve root. The amount of pain relief you get from the injection depends on what is causing the nerve to be swollen and irritated, and how long your pain lasts. You are more likely to benefit from this injection if your pain is strong and comes on suddenly rather than if you have had long-term (chronic) pain. Tell a health care provider about: Any allergies you have. All medicines you are taking, including vitamins, herbs, eye drops, creams, and over-the-counter medicines. Any problems you or family members have had with anesthetic medicines. Any blood disorders you have. Any surgeries you have had. Any medical conditions you have. Whether you are pregnant or may be pregnant. What are the risks? Generally, this is a safe procedure. However, problems may occur, including: Headache. Bleeding. Infection. Allergic reaction to medicines. Nerve damage. What happens before the procedure? Staying hydrated Follow instructions from your health care provider about hydration, which may include: Up to 2 hours before the procedure - you may continue to drink clear liquids, such as water, clear fruit juice, black coffee, and plain tea. Eating and drinking restrictions Follow instructions from your health care provider about eating and drinking, which may include: 8 hours before the procedure - stop eating heavy meals or foods, such as meat, fried foods, or fatty foods. 6 hours before the procedure - stop eating light  meals or foods, such as toast or cereal. 6 hours before the procedure - stop drinking milk or drinks that contain milk. 2 hours before the procedure - stop drinking clear liquids. Medicines You may be given medicines to lower anxiety. Ask your health care provider about: Changing or stopping your regular medicines. This is especially important if you are taking diabetes medicines or blood thinners. Taking medicines such as aspirin and ibuprofen. These medicines can thin your blood. Do not take these medicines unless your health care provider tells you to take them. Taking over-the-counter medicines, vitamins, herbs, and supplements. General instructions Ask your health care provider what steps will be taken to prevent infection. Plan to have a responsible adult take you home from the hospital or clinic. If you will be going home right after the procedure, plan to have a responsible adult care for you for the time you are told. This is important. What happens during the procedure? An IV will be inserted into one of your veins. You will be given one or more of the following: A medicine to help you relax (sedative). A medicine to numb the area (local anesthetic). You will be asked to lie on your abdomen or sit. The injection site will be cleaned. A needle will be inserted through your skin into the epidural space. This may cause you some discomfort. An X-ray machine will be used to guide the needle as close as possible to the affected nerve. A steroid medicine and a local anesthetic will be injected into the epidural space. The needle and IV will be removed. A bandage (dressing) will  be put over the injection site. The procedure may vary among health care providers and hospitals. What can I expect after the procedure? Your blood pressure, heart rate, breathing rate, and blood oxygen level will be monitored until you leave the hospital or clinic. Your arm or leg may feel weak or numb for a few  hours. The injection site may feel sore. Follow these instructions at home: Injection site care You may remove the bandage (dressing) after 24 hours. Check your injection site every day for signs of infection. Check for: Redness, swelling, or pain. Fluid or blood. Warmth. Pus or a bad smell. Managing pain, stiffness, and swelling For 24 hours after the procedure: Avoid using heat on the injection site. Do not take baths, swim, or use a hot tub until your health care provider approves. Ask your health care provider if you may take showers. You may only be allowed to take sponge baths. If directed, put ice on the injection site. To do this: Put ice in a plastic bag. Place a towel between your skin and the bag. Leave the ice on for 20 minutes, 2-3 times a day.  Activity If you were given a sedative during the procedure, it can affect you for several hours. Do not drive or operate machinery until your health care provider says that it is safe. Return to your normal activities as told by your health care provider. Ask your health care provider what activities are safe for you. General instructions Take over-the-counter and prescription medicines only as told by your health care provider. Drink enough fluid to keep your urine pale yellow. Keep all follow-up visits as told by your health care provider. This is important. Contact a health care provider if: You have any of these signs of infection: Redness, swelling, or pain around your injection site. Fluid or blood coming from your injection site. Warmth coming from your injection site. Pus or a bad smell coming from your injection site. A fever. You continue to have pain and soreness around the injection site, even after taking over-the-counter pain medicine. You have severe, sudden, or lasting nausea or vomiting. Get help right away if: You have severe pain at the injection site that is not relieved by medicines. You develop a severe  headache or a stiff neck. You become sensitive to light. You have any new numbness or weakness in your legs or arms. You lose control of your bladder or bowel movements. You have trouble breathing. Summary An epidural steroid injection is a shot of steroid medicine and numbing medicine that is given into the epidural space. The shot helps relieve pain caused by an irritated or swollen nerve root. You are more likely to benefit from this injection if your pain is strong and comes on suddenly rather than if you have had chronic pain. This information is not intended to replace advice given to you by your health care provider. Make sure you discuss any questions you have with your health care provider. Document Revised: 02/27/2020 Document Reviewed: 05/12/2019 Elsevier Patient Education  [place].

## 2021-08-16 ENCOUNTER — Telehealth: Payer: Self-pay | Admitting: Internal Medicine

## 2021-08-16 ENCOUNTER — Ambulatory Visit: Payer: PPO | Attending: Internal Medicine

## 2021-08-16 ENCOUNTER — Other Ambulatory Visit (HOSPITAL_BASED_OUTPATIENT_CLINIC_OR_DEPARTMENT_OTHER): Payer: Self-pay

## 2021-08-16 DIAGNOSIS — K760 Fatty (change of) liver, not elsewhere classified: Secondary | ICD-10-CM

## 2021-08-16 DIAGNOSIS — Z23 Encounter for immunization: Secondary | ICD-10-CM

## 2021-08-16 MED ORDER — PFIZER COVID-19 VAC BIVALENT 30 MCG/0.3ML IM SUSP
INTRAMUSCULAR | 0 refills | Status: DC
  Filled 2021-08-16: qty 0.3, 1d supply, fill #0

## 2021-08-16 NOTE — Progress Notes (Signed)
   Covid-19 Vaccination Clinic  Name:  [name]    MRN: [id number] DOB: 18-Sep-1946  08/16/2021  [name] was observed post Covid-19 immunization for 15 minutes without incident. He was provided with Vaccine Information Sheet and instruction to access the V-Safe system.   [name] was instructed to call 911 with any severe reactions post vaccine: Difficulty breathing  Swelling of face and throat  A fast heartbeat  A bad rash all over body  Dizziness and weakness

## 2021-08-16 NOTE — Telephone Encounter (Signed)
Please let them know that I have ordered an ultrasound of his liver to evaluate the fatty liver seen on his CT scan.  They will call him to schedule this.

## 2021-08-16 NOTE — Telephone Encounter (Signed)
Spoke with patient.

## 2021-08-17 ENCOUNTER — Other Ambulatory Visit: Payer: Self-pay

## 2021-08-17 ENCOUNTER — Ambulatory Visit (INDEPENDENT_AMBULATORY_CARE_PROVIDER_SITE_OTHER): Payer: PPO | Admitting: Internal Medicine

## 2021-08-17 VITALS — BP 98/54 | HR 99 | Ht 77.0 in | Wt 240.0 lb

## 2021-08-17 DIAGNOSIS — I442 Atrioventricular block, complete: Secondary | ICD-10-CM

## 2021-08-17 DIAGNOSIS — I10 Essential (primary) hypertension: Secondary | ICD-10-CM

## 2021-08-17 NOTE — Progress Notes (Addendum)
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  Since last being seen in our clinic, the patient reports doing very well.  Today, he denies symptoms of palpitations, chest pain, shortness of breath,  lower extremity edema, dizziness, presyncope, or syncope.  The patient is otherwise without complaint today.   Past Medical History:  Diagnosis Date   Complete heart block (HCC)    S/P  PPM by Dr Rayann Heman   Coronary artery disease    Non-obstructive. Pt had left heart catheterization in August 2009 showing a 30% mid-circumflex lesion, otherwise coronaries were clean angiographically   Hyperlipidemia    Hypertension    OSA (obstructive sleep apnea)    Probable; study never scheduled   Past Surgical History:  Procedure Laterality Date   [place], 2009   2009- negative   COLONOSCOPY  2011   negative, [facility]   PACEMAKER INSERTION  01/13/11   MDT by Greggory Brandy for complete heart block    ROS- all systems are reviewed and negative except as per HPI above  Current Outpatient Medications  Medication Sig Dispense Refill   allopurinol (ZYLOPRIM) 300 MG tablet TAKE 0.5 TABLETS BY MOUTH DAILY. 45 tablet 1   clobetasol (TEMOVATE) 0.05 % cream Apply 1 application topically daily as needed (itching).     COVID-19 mRNA bivalent vaccine, Pfizer, (PFIZER COVID-19 VAC BIVALENT) injection Inject into the muscle. 0.3 mL 0   fish oil-omega-3 fatty acids 1000 MG capsule Take 1 g by mouth 2 (two) times daily.     fluorouracil (EFUDEX) 5 % cream Apply 1 application topically daily.     gabapentin (NEURONTIN) 300 MG capsule TAKE 1 CAPSULE BY MOUTH EVERYDAY AT BEDTIME 90 capsule 1   ibuprofen (ADVIL,MOTRIN) 200 MG tablet Take 200 mg by mouth daily.     losartan (COZAAR) 100 MG tablet TAKE 1 TABLET BY MOUTH EVERY DAY 90 tablet 1   mometasone (ELOCON) 0.1 % lotion SMARTSIG:1 Topical Daily PRN      sildenafil (REVATIO) 20 MG tablet TAKE 3 TO 5 TABLETS BY MOUTH AS NEEDED 100 tablet 0   simvastatin (ZOCOR) 40 MG tablet TAKE 1 TABLET BY MOUTH DAILY AT 6 PM. 90 tablet 1   No current facility-administered medications for this visit.    Physical Exam: There were no vitals filed for this visit.  GEN- The patient is well appearing, alert and oriented x 3 today.   Head- normocephalic, atraumatic Eyes-  Sclera clear, conjunctiva pink Ears- hearing intact Oropharynx- clear Lungs- Clear to ausculation bilaterally, normal work of breathing Chest- pacemaker pocket is well healed Heart- Regular rate and rhythm, no murmurs, rubs or gallops, PMI not laterally displaced GI- soft, NT, ND, + BS Extremities- no clubbing, cyanosis, or edema  Pacemaker interrogation- reviewed in detail today,  See PACEART report  ekg tracing ordered today is personally reviewed and shows AV paced,  V tracking of atrial tachycardia also noted  Assessment and Plan:  1. Symptomatic complete heart block Normal pacemaker function See Pace Art report No changes today he is not device dependant today  2. HTN I have instructed him to keep up with BP at home.  If BP is chronically low, we should reduce losartan.  3. [place] Noted on PPM Asymptomatic No changes No afib observed  Return to see EP APP annually  Thompson Grayer MD, [facility] 08/17/2021 2:01  PM   

## 2021-08-17 NOTE — Patient Instructions (Addendum)
Medication Instructions:  Your physician recommends that you continue on your current medications as directed. Please refer to the Current Medication list given to you today. *If you need a refill on your cardiac medications before your next appointment, please call your pharmacy*  Lab Work: None. If you have labs (blood work) drawn today and your tests are completely normal, you will receive your results only by: [place] (if you have MyChart) OR A paper copy in the mail If you have any lab test that is abnormal or we need to change your treatment, we will call you to review the results.  Testing/Procedures: None.  Follow-Up: At [facility], you and your health needs are our priority.  As part of our continuing mission to provide you with exceptional heart care, we have created designated Provider Care Teams.  These Care Teams include your primary Cardiologist (physician) and Advanced Practice Providers (APPs -  Physician Assistants and Nurse Practitioners) who all work together to provide you with the care you need, when you need it.  Your physician wants you to follow-up in: 12 months with   one of the following Advanced Practice Providers on your designated Care Team:    Tommye Standard, PA-C Legrand Como "Jonni Sanger" [place], [place]   You will receive a reminder letter in the mail two months in advance. If you don't receive a letter, please call our office to schedule the follow-up appointment.  Remote monitoring is used to monitor your Pacemaker from home. This monitoring reduces the number of office visits required to check your device to one time per year. It allows [place] to keep an eye on the functioning of your device to ensure it is working properly. You are scheduled for a device check from home on 09/28/21. You may send your transmission at any time that day. If you have a wireless device, the transmission will be sent automatically. After your physician reviews your transmission, you will  receive a postcard with your next transmission date.  We recommend signing up for the patient portal called "MyChart".  Sign up information is provided on this After Visit Summary.  MyChart is used to connect with patients for Virtual Visits (Telemedicine).  Patients are able to view lab/test results, encounter notes, upcoming appointments, etc.  Non-urgent messages can be sent to your provider as well.   To learn more about what you can do with MyChart, go to [email or web address].    Any Other Special Instructions Will Be Listed Below (If Applicable).

## 2021-08-18 ENCOUNTER — Ambulatory Visit
Admission: RE | Admit: 2021-08-18 | Discharge: 2021-08-18 | Disposition: A | Discharge status: Home / Self Care | Payer: PPO | Source: Ambulatory Visit | Attending: Internal Medicine | Admitting: Internal Medicine

## 2021-08-18 DIAGNOSIS — K760 Fatty (change of) liver, not elsewhere classified: Secondary | ICD-10-CM | POA: Diagnosis not present

## 2021-08-20 ENCOUNTER — Encounter: Payer: Self-pay | Admitting: Internal Medicine

## 2021-08-22 ENCOUNTER — Ambulatory Visit
Admission: RE | Admit: 2021-08-22 | Discharge: 2021-08-22 | Disposition: A | Discharge status: Home / Self Care | Payer: PPO | Source: Ambulatory Visit | Attending: Family Medicine | Admitting: Family Medicine

## 2021-08-22 DIAGNOSIS — G8929 Other chronic pain: Secondary | ICD-10-CM

## 2021-08-22 DIAGNOSIS — M4727 Other spondylosis with radiculopathy, lumbosacral region: Secondary | ICD-10-CM | POA: Diagnosis not present

## 2021-08-22 MED ORDER — METHYLPREDNISOLONE ACETATE 40 MG/ML INJ SUSP (RADIOLOG
80.0000 mg | Freq: Once | INTRAMUSCULAR | Status: AC
  Administered 2021-08-22: 80 mg via EPIDURAL

## 2021-08-22 MED ORDER — IOPAMIDOL (ISOVUE-M 200) INJECTION 41%
1.0000 mL | Freq: Once | INTRAMUSCULAR | Status: AC
  Administered 2021-08-22: 1 mL via EPIDURAL

## 2021-08-22 NOTE — Discharge Instructions (Signed)

## 2021-08-24 ENCOUNTER — Ambulatory Visit (HOSPITAL_BASED_OUTPATIENT_CLINIC_OR_DEPARTMENT_OTHER): Payer: PPO | Attending: Family Medicine | Admitting: Physical Therapy

## 2021-08-24 ENCOUNTER — Encounter: Payer: PPO | Admitting: Physical Therapy

## 2021-08-24 ENCOUNTER — Encounter (HOSPITAL_BASED_OUTPATIENT_CLINIC_OR_DEPARTMENT_OTHER): Payer: Self-pay | Admitting: Physical Therapy

## 2021-08-24 ENCOUNTER — Encounter: Payer: Self-pay | Admitting: Physical Therapy

## 2021-08-24 ENCOUNTER — Other Ambulatory Visit: Payer: Self-pay

## 2021-08-24 DIAGNOSIS — M6281 Muscle weakness (generalized): Secondary | ICD-10-CM | POA: Diagnosis not present

## 2021-08-24 DIAGNOSIS — M545 Low back pain, unspecified: Secondary | ICD-10-CM | POA: Insufficient documentation

## 2021-08-24 DIAGNOSIS — R262 Difficulty in walking, not elsewhere classified: Secondary | ICD-10-CM | POA: Diagnosis not present

## 2021-08-24 NOTE — Therapy (Signed)
[facility] [address] [place], [place], [address] Phone: [phone]   Fax:  [phone]  Physical Therapy Treatment/Re-Cert   Patient Details  Name: [name] MRN: [id number] Date of Birth: [birth year] Referring Provider (PT): [name]   Encounter Date: 08/05/2021   PT End of Session - 08/24/21 1349     Visit Number 6    Number of Visits 20    Date for PT Re-Evaluation 09/16/21    Authorization Type HTA    PT Start Time 1150    PT Stop Time 1230    PT Time Calculation (min) 40 min    Activity Tolerance Patient tolerated treatment well    Behavior During Therapy [facility] for tasks assessed/performed             Past Medical History:  Diagnosis Date   Complete heart block (HCC)    S/P  PPM by Dr Rayann Heman   Coronary artery disease    Non-obstructive. Pt had left heart catheterization in August 2009 showing a 30% mid-circumflex lesion, otherwise coronaries were clean angiographically   Hyperlipidemia    Hypertension    OSA (obstructive sleep apnea)    Probable; study never scheduled    Past Surgical History:  Procedure Laterality Date   [place], 2009   2009- negative   COLONOSCOPY  2011   negative, [facility]   PACEMAKER INSERTION  01/13/11   MDT by Greggory Brandy for complete heart block    There were no vitals filed for this visit.   Subjective Assessment - 08/24/21 1347     Subjective Pt states still having pain, increased pain with standing/walking. He has MD f/u next week.    Currently in Pain? Yes    Pain Score 3     Pain Location Back    Pain Orientation Right    Pain Descriptors / Indicators Aching    Pain Type Acute pain    Pain Onset More than a month ago    Pain Frequency Intermittent                               [facility] Adult PT Treatment/Exercise - 08/24/21 1357       Lumbar Exercises: Stretches   Active Hamstring Stretch 3 reps;30 seconds    Active  Hamstring Stretch Limitations seated    Lower Trunk Rotation 5 reps;10 seconds      Lumbar Exercises: Aerobic   Recumbent Bike L 2 x 8  min;      Lumbar Exercises: Standing   Functional Squats 15 reps      Lumbar Exercises: Supine   Bridge 15 reps    Straight Leg Raise 10 reps    Straight Leg Raises Limitations with active TA      Lumbar Exercises: Sidelying   Hip Abduction 10 reps;Both      Manual Therapy   Manual therapy comments skilled palpation and monitoring of soft tissue with dry needling    Soft tissue mobilization DTM/ TPR to R low lumbar,  SI, and R glute min, piriformis    Manual Traction long leg distaction for lumbar pump x 2 min bil;              Trigger Point Dry Needling - 08/24/21 0001     Consent Given? Yes    Education Handout Provided Previously provided    Muscles Treated Back/Hip Lumbar multifidi  Lumbar multifidi Response Palpable increased muscle length   L and R;                  PT Education - 08/24/21 1348     Education Details Reviewed/updated HEP    Person(s) Educated Patient    Methods Explanation;Demonstration;Tactile cues;Verbal cues;Handout    Comprehension Verbalized understanding;Returned demonstration;Verbal cues required;Tactile cues required;Need further instruction              PT Short Term Goals - 08/24/21 1344       PT SHORT TERM GOAL #1   Title Pt to be independent with initial HEP    Time 2    Status Achieved               PT Long Term Goals - 08/24/21 1400       PT LONG TERM GOAL #1   Title Pt to be independent with final HEP    Time 6    Period Weeks    Status Partially Met    Target Date 09/16/21      PT LONG TERM GOAL #2   Title Pt to report ability for standing, walking, for at least 30 min with pain in lumbar spine and R LE decreased , to 0-2/10    Time 6    Period Weeks    Status Partially Met    Target Date 09/16/21      PT LONG TERM GOAL #3   Title Pt to demo improved  lumbar ROM for flexion to have only minimal deficit, to improve ability for ADLs and IADLS.    Time 6    Period Weeks    Status Partially Met    Target Date 09/16/21      PT LONG TERM GOAL #4   Title Pt to demo optimal posture for bend, lift, squat mechanics, to improve pain and safety with IADLs.    Time 6    Period Weeks    Status Partially Met    Target Date 09/16/21                   Plan - 08/24/21 1352     Clinical Impression Statement Pt has been seen for 6 visits. Last seen 4 weeks ago. Pt [place] beach, and has also been getting PT there. He is doing well with HEP and ther ex during sessions, but continues to have bothersome pain with increased standing and walking activity. Pt has made some progress with pain and strengthening, but pain with standing still limiting for functional activity. Pt has f/u with MD next week. Pt to benefit from continued care to continue strength, stability, and pain relief, and to improve tolerance for standing and functional activity.    Examination-Activity Limitations Lift;Stand;Locomotion Level;Carry;Squat;Bend    Examination-Participation Restrictions Cleaning;Meal Prep;Yard Work;Community Activity    Stability/Clinical Decision Making Stable/Uncomplicated    Rehab Potential Good    PT Frequency 2x / week    PT Duration 6 weeks    PT Treatment/Interventions ADLs/Self Care Home Management;Canalith Repostioning;Cryotherapy;DME Instruction;Traction;Moist Heat;Gait training;Stair training;Functional mobility training;Therapeutic activities;Therapeutic exercise;Balance training;Patient/family education;Neuromuscular re-education;Manual techniques;Taping;Dry needling;Passive range of motion;Spinal Manipulations;Joint Manipulations    PT Home Exercise Plan [facility]    Consulted and Agree with Plan of Care Patient             Patient will benefit from skilled therapeutic intervention in order to improve the  following deficits and impairments:  Pain, Improper  body mechanics, Decreased mobility, Hypomobility, Decreased strength, Decreased range of motion, Decreased activity tolerance, Impaired flexibility  Visit Diagnosis: Acute right-sided low back pain with right-sided sciatica - Plan: PT plan of care cert/re-cert     Problem List Patient Active Problem List   Diagnosis Date Noted   Chronic right SI joint pain 95/79/0092   Umbilical hernia without obstruction or gangrene 02/02/2021   Hepatic steatosis 08/05/2020   Aortic atherosclerosis ([place]) 08/04/2020   Complex regional pain syndrome type I of the upper limb 07/26/2020   Flexion contracture of joint of hand 07/26/2020   Pacemaker 07/26/2020   Rib pain on left side 06/09/2020   Decreased pulses in feet 06/09/2020   Erectile dysfunction 03/03/2017   Chronic headaches 02/13/2017   Snoring 02/15/2015   Elevated liver enzymes 09/13/2014   Dupuytren's contracture of both hands 09/02/2013   Stiffness of right hand joint 05/07/2013   Skin tear of hand without complication 00/41/5930   History of gout 12/27/2011   Essential hypertension 01/12/2011   Complete heart block ([place]) 01/12/2011   Hyperglycemia 03/02/2009   HYPERPLASIA PROSTATE UNS W/O UR OBST & OTH LUTS 08/13/2008   Hyperlipidemia 02/17/2008   PAROXYSMAL SUPRAVENTRICULAR TACHYCARDIA 02/17/2008    Lyndee Hensen, PT, DPT 2:03 PM  08/24/21     [facility], [place], [address] Phone: [phone]   Fax:  [phone]  Name: [name] MRN: [id number] Date of Birth: Jun 23, 1946

## 2021-08-24 NOTE — Patient Instructions (Signed)
URL: [email or web address] Date: 08/24/2021 Prepared by: Daleen Bo  Exercises Supine Posterior Pelvic Tilt - 2 x daily - 10 reps - 2 hold Supine Piriformis Stretch with Foot on Ground - 1 x daily - 7 x weekly - 3 sets - 10 reps Supine Lower Trunk Rotation - 2 x daily - 10 reps - 5 hold Neutral Lumbar Spine Curl Up - 2 x daily - 7 x weekly - 2 sets - 10 reps - 2 hold [facility]

## 2021-08-24 NOTE — Therapy (Signed)
[facility] [place], [place], [address] Phone: [phone]   Fax:  [phone]  Physical Therapy Re-Evaluation  Patient Details  Name: [name] MRN: [id number] Date of Birth: 09-18-1946 Referring Provider (PT): [name]   Encounter Date: 08/24/2021   PT End of Session - 08/24/21 1343     Visit Number 1    Number of Visits 12    Date for PT Re-Evaluation 11/22/21    Authorization Type HTA    PT Start Time 1343    PT Stop Time 1430    PT Time Calculation (min) 47 min    Activity Tolerance Patient tolerated treatment well    Behavior During Therapy [facility] for tasks assessed/performed             Past Medical History:  Diagnosis Date   Complete heart block (HCC)    S/P  PPM by Dr Rayann Heman   Coronary artery disease    Non-obstructive. Pt had left heart catheterization in August 2009 showing a 30% mid-circumflex lesion, otherwise coronaries were clean angiographically   Hyperlipidemia    Hypertension    OSA (obstructive sleep apnea)    Probable; study never scheduled    Past Surgical History:  Procedure Laterality Date   [place], 2009   2009- negative   COLONOSCOPY  2011   negative, [facility]   PACEMAKER INSERTION  01/13/11   MDT by Greggory Brandy for complete heart block    There were no vitals filed for this visit.    Subjective Assessment - 08/24/21 1344     Subjective Pt states he has had an injection from Dr. Georgina Snell. He said that the pain decreased but today the pain is back to about a 3/10. It is not as bad as it has been. Pt sees MD again on 25th. Pt states the R leg pain has been there the past few weeks with pain being worst in the mornings. Pt states he has had mixed compliance with HEP.    Pertinent History Pacemaker    Limitations Lifting;Standing;Walking;House hold activities    Diagnostic tests Myelogram: IMPRESSION:  1. Advanced lumbar disc and facet degeneration,  greatest at L3-4  where there is moderate spinal stenosis and severe right neural  stenosis.  2. Moderate bilateral neural foraminal stenosis at L4-5.  3. Mild spinal stenosis at L2-3.  4. Aortic Atherosclerosis (ICD10-I70.0).    Currently in Pain? Yes    Pain Score 1     Pain Location Back    Pain Orientation Right    Pain Descriptors / Indicators Aching;Dull    Pain Type Chronic pain    Pain Onset More than a month ago                [facility] PT Assessment - 08/24/21 0001       Assessment   Medical Diagnosis Low Back Pain    Referring Provider (PT) [name]    Prior Therapy continued from previous episode      Precautions   Precautions ICD/Pacemaker    Precaution Comments pacemaker      Restrictions   Weight Bearing Restrictions No      Balance Screen   Has the patient fallen in the past 6 months No    Has the patient had a decrease in activity level because of a fear of falling?  No    Is the patient reluctant to leave their home because of a fear of  falling?  No      Home Ecologist residence      Prior Function   Level of Independence Independent      Cognition   Overall Cognitive Status Within Functional Limits for tasks assessed      Observation/Other Assessments   Other Surveys  Modified Oswestry    Modified Oswestry 12 / 50 or 24 %      Functional Tests   Functional tests Sit to Stand   5XSTS 16.3s     Posture/Postural Control   Posture/Postural Control Postural limitations    Postural Limitations Rounded Shoulders;Increased thoracic kyphosis;Decreased lumbar lordosis;Flexed trunk      AROM   Overall AROM Comments Lumbar: flexion 60%, ext 10%, L rot 60%, R rot 50% ,L SB 50%, R SB 50%; Hips: limited with gross movement screen, unable to cross and uncross legs into figure 4 without UE assist      Strength   Overall Strength Comments Hips throughout: 4/5, Knee: 5/5,      Palpation   Palpation comment TTP in R low lumbar  region, into R SI, and R gluteals and hip rotators      Special Tests   Other special tests --      Ambulation/Gait   Ambulation/Gait Yes    Ambulation Distance (Feet) 40 Feet    Assistive device None    Gait Pattern Decreased stride length;Decreased trunk rotation;Trunk flexed    Ambulation Surface Level                        Objective measurements completed on examination: See above findings.       [facility] Adult PT Treatment/Exercise - 08/24/21 1422       Lumbar Exercises: Stretches           Lower Trunk Rotation 5 reps;10 seconds    Pelvic Tilt 10 reps   2s   Piriformis Stretch Limitations knees bent 30s 2x each               Lumbar Exercises: Standing   Functional Squats 15 reps      Lumbar Exercises: Supine               Other Supine Lumbar Exercises [place] curl up 2s 10x               Manual Therapy   Manual therapy comments bilat L/S paraspinals and R QL, gentle grade II CPA L1-5                     PT Education - 08/24/21 1434     Education Details anatomy, exercise progression, DOMS expectations, muscle firing, HEP and HEP compliance, joint protection, postural changes, POC    Person(s) Educated Patient    Methods Explanation;Tactile cues;Demonstration;Verbal cues;Handout    Comprehension Verbalized understanding;Returned demonstration;Verbal cues required              PT Short Term Goals - 08/24/21 1344       PT SHORT TERM GOAL #1   Title Pt to be independent with initial HEP    Time 2    Status New      PT SHORT TERM GOAL #2   Title Pt will demonstrate at least a 12.8 improvement in Oswestry Index in order to demonstrate a clinically significant change in LBP and function.    Time 3    Period Weeks    Status  New               PT Long Term Goals - 08/24/21 1400       PT LONG TERM GOAL #1   Title Pt to be independent with final HEP    Time 6    Period Weeks    Status Partially Met    Target  Date 09/16/21      PT LONG TERM GOAL #2   Title Pt to report ability for standing, walking, for at least 30 min with pain in lumbar spine and R LE decreased , to 0-2/10    Time 6    Period Weeks    Status Partially Met    Target Date 09/16/21      PT LONG TERM GOAL #3   Title Pt to demo improved lumbar ROM for flexion to have only minimal deficit, to improve ability for ADLs and IADLS.    Time 6    Period Weeks    Status Partially Met    Target Date 09/16/21      PT LONG TERM GOAL #4   Title Pt to demo optimal posture for bend, lift, squat mechanics, to improve pain and safety with IADLs.    Time 6    Period Weeks    Status Partially Met    Target Date 09/16/21                    Plan - 08/24/21 1437     Clinical Impression Statement Pt presents with expected L/S stiffness at today's session but was not pain limited. Pt appears to be responding  well to recent injection but has not been compliant with previous HEP. New baseline measurements have been taken and pt demonstrates continued lumbar stiffness and soft tissue restriction. Pt presents with flexion based preference. Pt will likely need review of trunk/core stabilization exercise at next session.  Pt's impairment limits their participation with ADL, recreation, and daily mobility. Pt would benefit from continued skilled therapy in order to reach goals and maximize functional  lumbopelvic and R LE strength for prevention of further functional decline.    Examination-Activity Limitations Lift;Stand;Locomotion Level;Carry;Squat;Bend    Examination-Participation Restrictions Cleaning;Meal Prep;Yard Work;Community Activity    Stability/Clinical Decision Making Stable/Uncomplicated    Clinical Decision Making Low    Rehab Potential Good    PT Frequency 2x / week    PT Duration 6 weeks    PT Treatment/Interventions ADLs/Self Care Home Management;Canalith Repostioning;Cryotherapy;DME Instruction;Traction;Moist Heat;Gait  training;Stair training;Functional mobility training;Therapeutic activities;Therapeutic exercise;Balance training;Patient/family education;Neuromuscular re-education;Manual techniques;Taping;Dry needling;Passive range of motion;Spinal Manipulations;Joint Manipulations;Aquatic Therapy;Electrical Stimulation    PT Next Visit Plan review HEP, flexion stretching, hip flexor stretch    PT Home Exercise Plan [facility]    Consulted and Agree with Plan of Care Patient             Patient will benefit from skilled therapeutic intervention in order to improve the following deficits and impairments:  Pain, Improper body mechanics, Decreased mobility, Hypomobility, Decreased strength, Decreased range of motion, Decreased activity tolerance, Impaired flexibility  Visit Diagnosis: Pain, lumbar region  Muscle weakness (generalized)  Difficulty walking     Problem List Patient Active Problem List   Diagnosis Date Noted   Chronic right SI joint pain 58/85/0277   Umbilical hernia without obstruction or gangrene 02/02/2021   Hepatic steatosis 08/05/2020   Aortic atherosclerosis ([place]) 08/04/2020   Complex regional pain syndrome type I of the upper limb 07/26/2020  Flexion contracture of joint of hand 07/26/2020   Pacemaker 07/26/2020   Rib pain on left side 06/09/2020   Decreased pulses in feet 06/09/2020   Erectile dysfunction 03/03/2017   Chronic headaches 02/13/2017   Snoring 02/15/2015   Elevated liver enzymes 09/13/2014   Dupuytren's contracture of both hands 09/02/2013   Stiffness of right hand joint 05/07/2013   Skin tear of hand without complication 75/44/9201   History of gout 12/27/2011   Essential hypertension 01/12/2011   Complete heart block ([place]) 01/12/2011   Hyperglycemia 03/02/2009   HYPERPLASIA PROSTATE UNS W/O UR OBST & OTH LUTS 08/13/2008   Hyperlipidemia 02/17/2008   PAROXYSMAL SUPRAVENTRICULAR TACHYCARDIA 02/17/2008    Daleen Bo, PT 08/24/2021, 2:43 PM  [facility] [place], [place], [address] Phone: [phone]   Fax:  [phone]  Name: [name] MRN: [id number] Date of Birth: February 13, 1946

## 2021-08-25 ENCOUNTER — Other Ambulatory Visit: Payer: PPO

## 2021-08-26 ENCOUNTER — Ambulatory Visit (HOSPITAL_BASED_OUTPATIENT_CLINIC_OR_DEPARTMENT_OTHER): Payer: PPO | Admitting: Physical Therapy

## 2021-08-26 ENCOUNTER — Encounter: Payer: PPO | Admitting: Physical Therapy

## 2021-08-26 ENCOUNTER — Encounter (HOSPITAL_BASED_OUTPATIENT_CLINIC_OR_DEPARTMENT_OTHER): Payer: Self-pay | Admitting: Physical Therapy

## 2021-08-26 ENCOUNTER — Other Ambulatory Visit: Payer: Self-pay

## 2021-08-26 DIAGNOSIS — R262 Difficulty in walking, not elsewhere classified: Secondary | ICD-10-CM

## 2021-08-26 DIAGNOSIS — M545 Low back pain, unspecified: Secondary | ICD-10-CM | POA: Diagnosis not present

## 2021-08-26 DIAGNOSIS — M6281 Muscle weakness (generalized): Secondary | ICD-10-CM

## 2021-08-26 NOTE — Therapy (Signed)
[place], [place], [address] Phone: [phone]   Fax:  [phone]  Physical Therapy Treatment  Patient Details  Name: [name] MRN: [id number] Date of Birth: [birth year] Referring Provider (PT): [name]   Encounter Date: 08/26/2021   PT End of Session - 08/26/21 1018     Visit Number 2    Number of Visits 12    Date for PT Re-Evaluation 11/22/21    Authorization Type HTA    PT Start Time 1015    PT Stop Time 1055    PT Time Calculation (min) 40 min    Activity Tolerance Patient tolerated treatment well    Behavior During Therapy [facility] for tasks assessed/performed             Past Medical History:  Diagnosis Date   Complete heart block (HCC)    S/P  PPM by Dr Rayann Heman   Coronary artery disease    Non-obstructive. Pt had left heart catheterization in August 2009 showing a 30% mid-circumflex lesion, otherwise coronaries were clean angiographically   Hyperlipidemia    Hypertension    OSA (obstructive sleep apnea)    Probable; study never scheduled    Past Surgical History:  Procedure Laterality Date   [place], 2009   2009- negative   COLONOSCOPY  2011   negative, [facility]   PACEMAKER INSERTION  01/13/11   MDT by Greggory Brandy for complete heart block    There were no vitals filed for this visit.   Subjective Assessment - 08/26/21 1019     Subjective Pt states the back is doing alright today. He did not have pain with HEP and this morning was the first time he did not need to sit on ice to reduce NT into the R hip.    Pertinent History Pacemaker    Limitations Lifting;Standing;Walking;House hold activities    Diagnostic tests Myelogram: IMPRESSION:  1. Advanced lumbar disc and facet degeneration, greatest at L3-4  where there is moderate spinal stenosis and severe right neural  stenosis.  2. Moderate bilateral neural foraminal stenosis at L4-5.  3. Mild spinal  stenosis at L2-3.  4. Aortic Atherosclerosis (ICD10-I70.0).    Currently in Pain? Yes    Pain Score 1     Pain Location Back    Pain Orientation Right    Pain Descriptors / Indicators Aching    Pain Onset More than a month ago                 [facility] Adult PT Treatment/Exercise - 08/26/21 0001       Lumbar Exercises: Stretches   Lower Trunk Rotation 5 reps;10 seconds    Pelvic Tilt 10 reps   2s   Piriformis Stretch Limitations knees bent 30s 2x each      Seated QL Stretch    30s 3x on R only        Bridge Seated fwd flexion stretch Standing hip flexion stretch Paloff Press  2x10 10x 10s hold swissball each direction 30s 3x R side only (L side caused R pain) GTB 2x10 each      Lumbar Exercises: Supine   Other Supine Lumbar Exercises McGill curl up 2s 15x      Manual Therapy   Manual therapy comments bilat L/S paraspinals and R QL, gentle grade II CPA L1-5  PT Education - 08/26/21 1108     Education Details DOMS expectations, muscle firing, HEP and HEP compliance, joint protection, postural changes, increased daily mobility    Person(s) Educated Patient    Methods Explanation;Demonstration;Verbal cues;Handout;Tactile cues    Comprehension Verbalized understanding;Returned demonstration              PT Short Term Goals - 08/24/21 1344       PT SHORT TERM GOAL #1   Title Pt to be independent with initial HEP    Time 2    Status New      PT SHORT TERM GOAL #2   Title Pt will demonstrate at least a 12.8 improvement in Oswestry Index in order to demonstrate a clinically significant change in LBP and function.    Time 3    Period Weeks    Status New               PT Long Term Goals - 08/26/21 1035       PT LONG TERM GOAL #1   Title Pt to be independent with final HEP    Time 6    Period Weeks    Status Partially Met      PT LONG TERM GOAL #2   Title Pt to report ability for standing, walking, for at least 30  min with pain in lumbar spine and R LE decreased , to 0-2/10    Time 6    Period Weeks    Status Partially Met      PT LONG TERM GOAL #3   Title Pt will be able to perform 5XSTS in under 12  in order to demonstrate functional improvement above the cut off score for older adults.    Time 6    Period Weeks    Status New      PT LONG TERM GOAL #4   Title Pt will be able to demonstrate/report ability to sit/stand for extended periods of time without pain in order to demonstrate functional improvement and tolerance to static positioning.    Time 6    Period Weeks    Status New                   Plan - 08/26/21 1039     Clinical Impression Statement Pt continues to demonstrate R sided lumbar stiffness and muscle spasm of the QL and paraspinals though with decreased pain and discomfort as compared report from last session. Pt required VC and TC during review of lumbopelvic movements today during session but was able to incorporate gentle core and hip strength without increased pain. Pt does not tolerate excessive stretch or closing pattern on R. Continue with mobility as able as pt is stiff vs pain dominant at this time. Pt would benefit from continued skilled therapy in order to reach goals and maximize functional lumbopelvic and R LE strength for prevention of further functional decline.    Examination-Activity Limitations Lift;Stand;Locomotion Level;Carry;Squat;Bend    Examination-Participation Restrictions Cleaning;Meal Prep;Yard Work;Community Activity    Stability/Clinical Decision Making Stable/Uncomplicated    Rehab Potential Good    PT Frequency 2x / week    PT Duration 6 weeks    PT Treatment/Interventions ADLs/Self Care Home Management;Canalith Repostioning;Cryotherapy;DME Instruction;Traction;Moist Heat;Gait training;Stair training;Functional mobility training;Therapeutic activities;Therapeutic exercise;Balance training;Patient/family education;Neuromuscular  re-education;Manual techniques;Taping;Dry needling;Passive range of motion;Spinal Manipulations;Joint Manipulations;Aquatic Therapy;Electrical Stimulation    PT Next Visit Plan review HEP, flexion stretching, hip flexor stretch    PT Home Exercise Plan [facility]  Consulted and Agree with Plan of Care Patient             Patient will benefit from skilled therapeutic intervention in order to improve the following deficits and impairments:  Pain, Improper body mechanics, Decreased mobility, Hypomobility, Decreased strength, Decreased range of motion, Decreased activity tolerance, Impaired flexibility  Visit Diagnosis: Pain, lumbar region  Muscle weakness (generalized)  Difficulty walking     Problem List Patient Active Problem List   Diagnosis Date Noted   Chronic right SI joint pain 61/84/8592   Umbilical hernia without obstruction or gangrene 02/02/2021   Hepatic steatosis 08/05/2020   Aortic atherosclerosis ([place]) 08/04/2020   Complex regional pain syndrome type I of the upper limb 07/26/2020   Flexion contracture of joint of hand 07/26/2020   Pacemaker 07/26/2020   Rib pain on left side 06/09/2020   Decreased pulses in feet 06/09/2020   Erectile dysfunction 03/03/2017   Chronic headaches 02/13/2017   Snoring 02/15/2015   Elevated liver enzymes 09/13/2014   Dupuytren's contracture of both hands 09/02/2013   Stiffness of right hand joint 05/07/2013   Skin tear of hand without complication 76/39/4320   History of gout 12/27/2011   Essential hypertension 01/12/2011   Complete heart block ([place]) 01/12/2011   Hyperglycemia 03/02/2009   HYPERPLASIA PROSTATE UNS W/O UR OBST & OTH LUTS 08/13/2008   Hyperlipidemia 02/17/2008   PAROXYSMAL SUPRAVENTRICULAR TACHYCARDIA 02/17/2008    Daleen Bo, PT 08/26/2021, 11:12 AM  [place], [place], [address] Phone: [phone]   Fax:  [phone]  Name:  NIEL PERETTI MRN: [id number] Date of Birth: 1946-03-09

## 2021-08-31 ENCOUNTER — Ambulatory Visit (HOSPITAL_BASED_OUTPATIENT_CLINIC_OR_DEPARTMENT_OTHER): Payer: PPO | Admitting: Physical Therapy

## 2021-08-31 ENCOUNTER — Other Ambulatory Visit: Payer: Self-pay

## 2021-08-31 ENCOUNTER — Encounter (HOSPITAL_BASED_OUTPATIENT_CLINIC_OR_DEPARTMENT_OTHER): Payer: Self-pay | Admitting: Physical Therapy

## 2021-08-31 DIAGNOSIS — M6281 Muscle weakness (generalized): Secondary | ICD-10-CM

## 2021-08-31 DIAGNOSIS — M545 Low back pain, unspecified: Secondary | ICD-10-CM

## 2021-08-31 DIAGNOSIS — R262 Difficulty in walking, not elsewhere classified: Secondary | ICD-10-CM

## 2021-08-31 NOTE — Therapy (Signed)
[place], [place], [address] Phone: [phone]   Fax:  [phone]  Physical Therapy Treatment  Patient Details  Name: [name] MRN: [id number] Date of Birth: 1946-03-13 Referring Provider (PT): [name]   Encounter Date: 08/31/2021   PT End of Session - 08/31/21 1350     Visit Number 3    Number of Visits 12    Date for PT Re-Evaluation 11/22/21    Authorization Type HTA    PT Start Time 1350    PT Stop Time 1430    PT Time Calculation (min) 40 min    Activity Tolerance Patient tolerated treatment well    Behavior During Therapy [facility] for tasks assessed/performed             Past Medical History:  Diagnosis Date   Complete heart block (HCC)    S/P  PPM by Dr Rayann Heman   Coronary artery disease    Non-obstructive. Pt had left heart catheterization in August 2009 showing a 30% mid-circumflex lesion, otherwise coronaries were clean angiographically   Hyperlipidemia    Hypertension    OSA (obstructive sleep apnea)    Probable; study never scheduled    Past Surgical History:  Procedure Laterality Date   [place], 2009   2009- negative   COLONOSCOPY  2011   negative, [facility]   PACEMAKER INSERTION  01/13/11   MDT by Greggory Brandy for complete heart block    There were no vitals filed for this visit.   Subjective Assessment - 08/31/21 1415     Subjective Pt states he is having less NT. He has had 3 days without NT in the mornings and has not had to sit on ice.    Pertinent History Pacemaker    Limitations Lifting;Standing;Walking;House hold activities    Diagnostic tests Myelogram: IMPRESSION:  1. Advanced lumbar disc and facet degeneration, greatest at L3-4  where there is moderate spinal stenosis and severe right neural  stenosis.  2. Moderate bilateral neural foraminal stenosis at L4-5.  3. Mild spinal stenosis at L2-3.  4. Aortic Atherosclerosis (ICD10-I70.0).     Currently in Pain? No/denies    Pain Score 0-No pain    Pain Location Back    Pain Orientation Right    Pain Descriptors / Indicators Aching    Pain Onset More than a month ago                        Lumbar Exercises: Stretches    Lower Trunk Rotation 2 deep breathes;10 seconds     Pelvic Tilt 10 reps   2s              Seated QL Stretch      30s 3x on R only    Sidelying QL stretch 30s 3x          Bridge Seated fwd flexion stretch Standing hip flexion stretch Paloff Press   Standing shoulder extension  2x10 10x 10s hold swissball each direction 30s 3x R side only (On hold) GTB 2x10 each GTB 2x10 with abd brace           Lumbar Exercises: Supine    Other Supine Lumbar Exercises McGill curl up 2s 15x          Manual Therapy    Manual therapy comments bilat L/S paraspinals and R QL, gentle grade II CPA L1-5  PT Short Term Goals - 08/24/21 1344       PT SHORT TERM GOAL #1   Title Pt to be independent with initial HEP    Time 2    Status New      PT SHORT TERM GOAL #2   Title Pt will demonstrate at least a 12.8 improvement in Oswestry Index in order to demonstrate a clinically significant change in LBP and function.    Time 3    Period Weeks    Status New               PT Long Term Goals - 08/26/21 1035       PT LONG TERM GOAL #1   Title Pt to be independent with final HEP    Time 6    Period Weeks    Status Partially Met      PT LONG TERM GOAL #2   Title Pt to report ability for standing, walking, for at least 30 min with pain in lumbar spine and R LE decreased , to 0-2/10    Time 6    Period Weeks    Status Partially Met      PT LONG TERM GOAL #3   Title Pt will be able to perform 5XSTS in under 12  in order to demonstrate functional improvement above the cut off score for older adults.    Time 6    Period Weeks    Status New      PT LONG TERM GOAL #4   Title Pt will be able to  demonstrate/report ability to sit/stand for extended periods of time without pain in order to demonstrate functional improvement and tolerance to static positioning.    Time 6    Period Weeks    Status New                   Plan - 08/31/21 1416     Clinical Impression Statement Pt able to continue with progressive lumbar mobility work and isometric strengthening. Pt presented today with increased R QL hypertonicity/stiffness along lateral most border. Palpable improvement in soft tissue extensibility following STM and pt reports decreased flexion and rotation stiffness following treatment session. Pt unable to perform standing hip extension stretch due to extension restriction. Possibly include standing QL and lat stretch next session. Pt would benefit from continued skilled therapy in order to reach goals and maximize functional lumbopelvic and R LE strength for prevention of further functional decline.    Examination-Activity Limitations Lift;Stand;Locomotion Level;Carry;Squat;Bend    Examination-Participation Restrictions Cleaning;Meal Prep;Yard Work;Community Activity    Stability/Clinical Decision Making Stable/Uncomplicated    Rehab Potential Good    PT Frequency 2x / week    PT Duration 6 weeks    PT Treatment/Interventions ADLs/Self Care Home Management;Canalith Repostioning;Cryotherapy;DME Instruction;Traction;Moist Heat;Gait training;Stair training;Functional mobility training;Therapeutic activities;Therapeutic exercise;Balance training;Patient/family education;Neuromuscular re-education;Manual techniques;Taping;Dry needling;Passive range of motion;Spinal Manipulations;Joint Manipulations;Aquatic Therapy;Electrical Stimulation    PT Next Visit Plan --    PT Home Exercise Plan [facility]    Consulted and Agree with Plan of Care Patient             Patient will benefit from skilled therapeutic intervention in order to improve the following deficits and impairments:  Pain,  Improper body mechanics, Decreased mobility, Hypomobility, Decreased strength, Decreased range of motion, Decreased activity tolerance, Impaired flexibility  Visit Diagnosis: Pain, lumbar region  Muscle weakness (generalized)  Difficulty walking     Problem List Patient Active Problem List  Diagnosis Date Noted   Chronic right SI joint pain 05/11/2021   Umbilical hernia without obstruction or gangrene 02/02/2021   Hepatic steatosis 08/05/2020   Aortic atherosclerosis (HCC) 08/04/2020   Complex regional pain syndrome type I of the upper limb 07/26/2020   Flexion contracture of joint of hand 07/26/2020   Pacemaker 07/26/2020   Rib pain on left side 06/09/2020   Decreased pulses in feet 06/09/2020   Erectile dysfunction 03/03/2017   Chronic headaches 02/13/2017   Snoring 02/15/2015   Elevated liver enzymes 09/13/2014   Dupuytren's contracture of both hands 09/02/2013   Stiffness of right hand joint 05/07/2013   Skin tear of hand without complication 02/05/2013   History of gout 12/27/2011   Essential hypertension 01/12/2011   Complete heart block (HCC) 01/12/2011   Hyperglycemia 03/02/2009   HYPERPLASIA PROSTATE UNS W/O UR OBST & OTH LUTS 08/13/2008   Hyperlipidemia 02/17/2008   PAROXYSMAL SUPRAVENTRICULAR TACHYCARDIA 02/17/2008    Alan Zhou, PT 08/31/2021, 2:56 PM  [facility] [address] [place], [place], [address] Phone: [phone]   Fax:  [phone]  Name: Kristain L Sermersheim MRN: [id number] Date of Birth: 11/12/1946    

## 2021-09-02 ENCOUNTER — Ambulatory Visit (HOSPITAL_BASED_OUTPATIENT_CLINIC_OR_DEPARTMENT_OTHER): Payer: PPO | Admitting: Physical Therapy

## 2021-09-02 ENCOUNTER — Other Ambulatory Visit: Payer: Self-pay

## 2021-09-02 ENCOUNTER — Encounter (HOSPITAL_BASED_OUTPATIENT_CLINIC_OR_DEPARTMENT_OTHER): Payer: Self-pay | Admitting: Physical Therapy

## 2021-09-02 DIAGNOSIS — M545 Low back pain, unspecified: Secondary | ICD-10-CM | POA: Diagnosis not present

## 2021-09-02 DIAGNOSIS — M6281 Muscle weakness (generalized): Secondary | ICD-10-CM

## 2021-09-02 DIAGNOSIS — R262 Difficulty in walking, not elsewhere classified: Secondary | ICD-10-CM

## 2021-09-02 NOTE — Therapy (Signed)
[place], [place], [address] Phone: [phone]   Fax:  [phone]  Physical Therapy Treatment  Patient Details  Name: [name] MRN: [id number] Date of Birth: 02-11-1946 Referring Provider (PT): [name]   Encounter Date: 09/02/2021   PT End of Session - 09/02/21 1121     Visit Number 4    Number of Visits 12    Date for PT Re-Evaluation 11/22/21    Authorization Type HTA    PT Start Time 1108    PT Stop Time 1150    PT Time Calculation (min) 42 min    Activity Tolerance Patient tolerated treatment well    Behavior During Therapy [facility] for tasks assessed/performed             Past Medical History:  Diagnosis Date   Complete heart block (HCC)    S/P  PPM by Dr Rayann Heman   Coronary artery disease    Non-obstructive. Pt had left heart catheterization in August 2009 showing a 30% mid-circumflex lesion, otherwise coronaries were clean angiographically   Hyperlipidemia    Hypertension    OSA (obstructive sleep apnea)    Probable; study never scheduled    Past Surgical History:  Procedure Laterality Date   [place], 2009   2009- negative   COLONOSCOPY  2011   negative, [facility]   PACEMAKER INSERTION  01/13/11   MDT by Greggory Brandy for complete heart block    There were no vitals filed for this visit.   Subjective Assessment - 09/02/21 1131     Subjective Pt states he slept on his L side last night and woke up htis morning with some more pain than last time. He still did not have NT or need to sit on ice but his R L/L hurt more.    Pertinent History Pacemaker    Limitations Lifting;Standing;Walking;House hold activities    Diagnostic tests Myelogram: IMPRESSION:  1. Advanced lumbar disc and facet degeneration, greatest at L3-4  where there is moderate spinal stenosis and severe right neural  stenosis.  2. Moderate bilateral neural foraminal stenosis at L4-5.  3. Mild  spinal stenosis at L2-3.  4. Aortic Atherosclerosis (ICD10-I70.0).    Currently in Pain? Yes    Pain Score 1     Pain Location Back    Pain Orientation Right    Pain Descriptors / Indicators Aching;Sore    Pain Onset More than a month ago                               [facility] Adult PT Treatment/Exercise - 09/02/21 0001       Lumbar Exercises: Stretches   Lower Trunk Rotation Limitations 10x 3s holds    Piriformis Stretch Limitations knees bent 30s 2x each    Other Lumbar Stretch Exercise S/L QL stretch 30s 2x      Lumbar Exercises: Standing   Other Standing Lumbar Exercises palloff press GTB 2x10; standing shoulder ext bilat GTB with ab brace 2x10      Lumbar Exercises: Seated   Other Seated Lumbar Exercises seated lumbar flexion stretch fwd and to L 10s 10x each    Other Seated Lumbar Exercises standing QL stretch at doorframe 30s 3x on R only      Lumbar Exercises: Supine   Other Supine Lumbar Exercises McGill curl up 2s 10x      Manual Therapy  Manual therapy comments bilat L/S paraspinals and R QL, lower T/S parspinals on R                       PT Short Term Goals - 08/24/21 1344       PT SHORT TERM GOAL #1   Title Pt to be independent with initial HEP    Time 2    Status New      PT SHORT TERM GOAL #2   Title Pt will demonstrate at least a 12.8 improvement in Oswestry Index in order to demonstrate a clinically significant change in LBP and function.    Time 3    Period Weeks    Status New               PT Long Term Goals - 08/26/21 1035       PT LONG TERM GOAL #1   Title Pt to be independent with final HEP    Time 6    Period Weeks    Status Partially Met      PT LONG TERM GOAL #2   Title Pt to report ability for standing, walking, for at least 30 min with pain in lumbar spine and R LE decreased , to 0-2/10    Time 6    Period Weeks    Status Partially Met      PT LONG TERM GOAL #3   Title Pt will be able to  perform 5XSTS in under 12  in order to demonstrate functional improvement above the cut off score for older adults.    Time 6    Period Weeks    Status New      PT LONG TERM GOAL #4   Title Pt will be able to demonstrate/report ability to sit/stand for extended periods of time without pain in order to demonstrate functional improvement and tolerance to static positioning.    Time 6    Period Weeks    Status New                   Plan - 09/02/21 1202     Clinical Impression Statement Pt presented today with increased and pain within R QL hypertonicity/stiffness along lateral border. Pt also had R sided T/S paraspinal stiffness today that may be from report of static S/L positiniong at night.  Pt had palpable improvement in soft tissue extensibility following STM. Pt with good tolerance to stretching based exercise session with progression of isometric core stability loading. Plan to continue with mobility and strength as able. Pt is still stiff dominant. Pt would benefit from continued skilled therapy in order to reach goals and maximize functional lumbopelvic and R LE strength for prevention of further functional decline.    Examination-Activity Limitations Lift;Stand;Locomotion Level;Carry;Squat;Bend    Examination-Participation Restrictions Cleaning;Meal Prep;Yard Work;Community Activity    Stability/Clinical Decision Making Stable/Uncomplicated    Rehab Potential Good    PT Frequency 2x / week    PT Duration 6 weeks    PT Treatment/Interventions ADLs/Self Care Home Management;Canalith Repostioning;Cryotherapy;DME Instruction;Traction;Moist Heat;Gait training;Stair training;Functional mobility training;Therapeutic activities;Therapeutic exercise;Balance training;Patient/family education;Neuromuscular re-education;Manual techniques;Taping;Dry needling;Passive range of motion;Spinal Manipulations;Joint Manipulations;Aquatic Therapy;Electrical Stimulation    PT Home Exercise Plan  [facility]    Consulted and Agree with Plan of Care Patient             Patient will benefit from skilled therapeutic intervention in order to improve the following deficits and impairments:  Pain, Improper  body mechanics, Decreased mobility, Hypomobility, Decreased strength, Decreased range of motion, Decreased activity tolerance, Impaired flexibility  Visit Diagnosis: Pain, lumbar region  Muscle weakness (generalized)  Difficulty walking     Problem List Patient Active Problem List   Diagnosis Date Noted   Chronic right SI joint pain 37/00/5259   Umbilical hernia without obstruction or gangrene 02/02/2021   Hepatic steatosis 08/05/2020   Aortic atherosclerosis ([place]) 08/04/2020   Complex regional pain syndrome type I of the upper limb 07/26/2020   Flexion contracture of joint of hand 07/26/2020   Pacemaker 07/26/2020   Rib pain on left side 06/09/2020   Decreased pulses in feet 06/09/2020   Erectile dysfunction 03/03/2017   Chronic headaches 02/13/2017   Snoring 02/15/2015   Elevated liver enzymes 09/13/2014   Dupuytren's contracture of both hands 09/02/2013   Stiffness of right hand joint 05/07/2013   Skin tear of hand without complication 09/09/9021   History of gout 12/27/2011   Essential hypertension 01/12/2011   Complete heart block ([place]) 01/12/2011   Hyperglycemia 03/02/2009   HYPERPLASIA PROSTATE UNS W/O UR OBST & OTH LUTS 08/13/2008   Hyperlipidemia 02/17/2008   PAROXYSMAL SUPRAVENTRICULAR TACHYCARDIA 02/17/2008    Daleen Bo, PT 09/02/2021, 12:04 PM  [place], [place], [address] Phone: [phone]   Fax:  [phone]  Name: GODWIN TEDESCO MRN: [id number] Date of Birth: 08-07-1946

## 2021-09-06 ENCOUNTER — Other Ambulatory Visit: Payer: Self-pay

## 2021-09-06 ENCOUNTER — Ambulatory Visit (INDEPENDENT_AMBULATORY_CARE_PROVIDER_SITE_OTHER): Payer: PPO | Admitting: Family Medicine

## 2021-09-06 VITALS — BP 126/80 | HR 99 | Ht 77.0 in | Wt 234.0 lb

## 2021-09-06 DIAGNOSIS — G8929 Other chronic pain: Secondary | ICD-10-CM

## 2021-09-06 DIAGNOSIS — M7061 Trochanteric bursitis, right hip: Secondary | ICD-10-CM | POA: Diagnosis not present

## 2021-09-06 DIAGNOSIS — M7062 Trochanteric bursitis, left hip: Secondary | ICD-10-CM

## 2021-09-06 DIAGNOSIS — M5441 Lumbago with sciatica, right side: Secondary | ICD-10-CM

## 2021-09-06 NOTE — Patient Instructions (Addendum)
Thank you for coming in today.   Continue with physical therapy  You received an injection today. Seek immediate medical attention if the joint becomes red, extremely painful, or is oozing fluid.   Recheck back as needed

## 2021-09-06 NOTE — Progress Notes (Signed)
I, [name], LAT, ATC, am serving as scribe for Dr. [name].  [name] is a 75 y.o. male who presents to [place] at [facility] today for f/u of R-sided LBP  due to spinal stenosis w/ radiating pain into his R LE to his post calf.  He was last seen by Dr. Georgina Snell on 08/15/21 to review his CT myelogram and was referred to PT of which he has completed 4 sessions.  He was also referred for a lumbar ESI (R L5-S1) that he had on 08/22/21.  Today, pt reports some improvement in LBP. Pt notes the radiating symptoms have also improved.   He notes his back pain has improved but is still mildly present.  His radiating pain down his right leg has not resolved completely with the epidural steroid injection.  He is developed some pain in the bilateral lateral hips which has become a little more noticeable as his back pain has lessened.  This is bothersome when he stands up from a seated position and walks and when he lays on his side at night.  Diagnostic testing: L-spine CT w/ contrast- 08/10/21; R hip and L-spine XR- 05/11/21   Pertinent review of systems: No fevers or chills  Relevant historical information: Disease.   Exam:  BP 126/80   Pulse 99   Ht 6\' 5"  (1.956 m)   Wt 234 lb (106.1 kg)   SpO2 96%   BMI 27.75 kg/m  General: Well Developed, well nourished, and in no acute distress.   MSK: L-spine nontender midline.  Decreased lumbar motion. Right hip normal-appearing Tender palpation greater trochanter.  Hip abduction strength is diminished. Left hip normal-appearing.  Tender palpation greater trochanter.  Hip abduction strength is diminished.    Lab and Radiology Results  Hip greater trochanteric injection: right Consent obtained and timeout performed. Area of maximum tenderness palpated and identified. Skin cleaned with alcohol, cold spray applied. A 22g needle was used to access the greater trochanteric bursa. 40mg  of Kenalog and 2 mL of lidocaine  were used to inject the trochanteric bursa. Patient tolerated the procedure well.  Hip greater trochanteric injection: left Consent obtained and timeout performed. Area of maximum tenderness palpated and identified. Skin cleaned with alcohol, cold spray applied. A 22g needle was used to access the greater trochanteric bursa. 40mg  of Kenalog and 2 mL of lidocaine were used to inject the trochanteric bursa. Patient tolerated the procedure well.      Assessment and Plan: 75 y.o. male with bilateral hip pain due to greater trochanteric bursitis and hip abductor tendinopathy.  Plan for bilateral greater trochanter injection today.  Most important treatment will be adding this to physical therapy which already is in progress.  He should have significant improvement with the above treatment.  Recheck back as needed.  Axial back pain with right side L5 lumbar radiculopathy.  Significant improvement with epidural steroid injection October 10.  The lumbar radicular pain has completely resolved.  The axial back pain is significantly diminished with the epidural steroid injection and with PT.  Plan to continue PT if needed in the future could repeat epidural steroid injection or proceed with facet injection.  Recheck back as needed.   PDMP not reviewed this encounter. Orders Placed This Encounter  Procedures   Ambulatory referral to Physical Therapy    Referral Priority:   Routine    Referral Type:   Physical Medicine    Referral Reason:   Specialty Services Required  Requested Specialty:   Physical Therapy    Number of Visits Requested:   1   No orders of the defined types were placed in this encounter.    Discussed warning signs or symptoms. Please see discharge instructions. Patient expresses understanding.   The above documentation has been reviewed and is accurate and complete [name], M.D.

## 2021-09-09 ENCOUNTER — Encounter (HOSPITAL_BASED_OUTPATIENT_CLINIC_OR_DEPARTMENT_OTHER): Payer: Self-pay | Admitting: Physical Therapy

## 2021-09-09 ENCOUNTER — Other Ambulatory Visit: Payer: Self-pay

## 2021-09-09 ENCOUNTER — Ambulatory Visit (HOSPITAL_BASED_OUTPATIENT_CLINIC_OR_DEPARTMENT_OTHER): Payer: PPO | Admitting: Physical Therapy

## 2021-09-09 DIAGNOSIS — M545 Low back pain, unspecified: Secondary | ICD-10-CM | POA: Diagnosis not present

## 2021-09-09 DIAGNOSIS — R262 Difficulty in walking, not elsewhere classified: Secondary | ICD-10-CM

## 2021-09-09 DIAGNOSIS — M6281 Muscle weakness (generalized): Secondary | ICD-10-CM

## 2021-09-09 NOTE — Therapy (Signed)
[name], [place], [address] Phone: [phone]   Fax:  [phone]  Physical Therapy Treatment  Patient Details  Name: [name] MRN: [id number] Date of Birth: 1946-02-16 Referring Provider (PT): [name]   Encounter Date: 09/09/2021     Past Medical History:  Diagnosis Date   Complete heart block ([place])    S/P  PPM by Dr Rayann Heman   Coronary artery disease    Non-obstructive. Pt had left heart catheterization in August 2009 showing a 30% mid-circumflex lesion, otherwise coronaries were clean angiographically   Hyperlipidemia    Hypertension    OSA (obstructive sleep apnea)    Probable; study never scheduled    Past Surgical History:  Procedure Laterality Date   [place], 2009   2009- negative   COLONOSCOPY  2011   negative, [facility]   PACEMAKER INSERTION  01/13/11   MDT by Greggory Brandy for complete heart block    There were no vitals filed for this visit.   Subjective Assessment - 09/11/21 2050     Subjective Patient has been to Dr Georgina Snell who gave him bilateral hip injections. The injexctions in his hips ahve helped. He is not having any pain today.He hads not been having much pain since his injections.    Pertinent History Pacemaker    Limitations Lifting;Standing;Walking;House hold activities    Diagnostic tests Myelogram: IMPRESSION:  1. Advanced lumbar disc and facet degeneration, greatest at L3-4  where there is moderate spinal stenosis and severe right neural  stenosis.  2. Moderate bilateral neural foraminal stenosis at L4-5.  3. Mild spinal stenosis at L2-3.  4. Aortic Atherosclerosis (ICD10-I70.0).    Patient Stated Goals decreased pain , return to golf    Pain Onset More than a month ago                      Lumbar Exercises: Stretches     Lower Trunk Rotation Limitations 10x 3s holds     Piriformis Stretch Limitations knees bent 30s 2x each      Other Lumbar Stretch Exercise S/L QL stretch 30s 2x          Lumbar Exercises: Standing    Other Standing Lumbar Exercises palloff press GTB 2x10; standing shoulder ext bilat GTB with ab brace 2x10          Lumbar Exercises: Seated    Other Seated Lumbar Exercises seated lumbar flexion stretch fwd and to L 10s 10x each     Other Seated Lumbar Exercises standing QL stretch at doorframe 30s 3x on R only          Lumbar Exercises: Supine    Other Supine Lumbar Exercises Bridge  McGill curl up 2s 10x  2x10          Manual Therapy    Manual therapy comments bilat L/S paraspinals and R QL, lower T/S parspinals on R              [facility] Adult PT Treatment/Exercise - 09/11/21 0001       Lumbar Exercises: Stretches   Active Hamstring Stretch 3 reps;30 seconds    Active Hamstring Stretch Limitations seated    Lower Trunk Rotation Limitations 10x 3s holds    Piriformis Stretch Limitations knees bent 30s 2x each      Lumbar Exercises: Standing   Other Standing Lumbar Exercises palloff press GTB 2x10; standing shoulder ext bilat GTB with ab  brace 2x10; standing chop green 2x10 green; rowing 2x10 green      Lumbar Exercises: Supine   Clam 20 reps    Clam Limitations Bl TB with TA    Bent Knee Raise 20 reps    Bent Knee Raise Limitations with cuing on how to progress    Bridge Limitations 2x10    Straight Leg Raises Limitations 2x10      Manual Therapy   Manual therapy comments assed muscle spasming in hips and lower back; soft tissue mobilization to billateral lwoer lumbar paraspinala                        PT Short Term Goals - 08/24/21 1344       PT SHORT TERM GOAL #1   Title Pt to be independent with initial HEP    Time 2    Status New      PT SHORT TERM GOAL #2   Title Pt will demonstrate at least a 12.8 improvement in Oswestry Index in order to demonstrate a clinically significant change in LBP and function.    Time 3    Period Weeks    Status New                PT Long Term Goals - 08/26/21 1035       PT LONG TERM GOAL #1   Title Pt to be independent with final HEP    Time 6    Period Weeks    Status Partially Met      PT LONG TERM GOAL #2   Title Pt to report ability for standing, walking, for at least 30 min with pain in lumbar spine and R LE decreased , to 0-2/10    Time 6    Period Weeks    Status Partially Met      PT LONG TERM GOAL #3   Title Pt will be able to perform 5XSTS in under 12  in order to demonstrate functional improvement above the cut off score for older adults.    Time 6    Period Weeks    Status New      PT LONG TERM GOAL #4   Title Pt will be able to demonstrate/report ability to sit/stand for extended periods of time without pain in order to demonstrate functional improvement and tolerance to static positioning.    Time 6    Period Weeks    Status New                   Plan - 09/11/21 2049     Clinical Impression Statement Patient had very little pain follwing his injection. Therapy reviewed a supine and standing serioes of exercises for himto perfrom at home. He wasaa dvised to continue strengthening while his pai was down. Theray reviewed home stretching as well to reduce pain. Therapy reviewed sport specific ther-e to perfrom at home.    Examination-Activity Limitations Lift;Stand;Locomotion Level;Carry;Squat;Bend    Examination-Participation Restrictions Cleaning;Meal Prep;Yard Work;Community Activity    Stability/Clinical Decision Making Stable/Uncomplicated    Clinical Decision Making Low    Rehab Potential Good    PT Frequency 2x / week    PT Duration 6 weeks    PT Treatment/Interventions ADLs/Self Care Home Management;Canalith Repostioning;Cryotherapy;DME Instruction;Traction;Moist Heat;Gait training;Stair training;Functional mobility training;Therapeutic activities;Therapeutic exercise;Balance training;Patient/family education;Neuromuscular re-education;Manual  techniques;Taping;Dry needling;Passive range of motion;Spinal Manipulations;Joint Manipulations;Aquatic Therapy;Electrical Stimulation    PT Next Visit Plan review HEP, flexion stretching,  hip flexor stretch    PT Home Exercise Plan [facility]    Consulted and Agree with Plan of Care Patient             Patient will benefit from skilled therapeutic intervention in order to improve the following deficits and impairments:  Pain, Improper body mechanics, Decreased mobility, Hypomobility, Decreased strength, Decreased range of motion, Decreased activity tolerance, Impaired flexibility  Visit Diagnosis: Pain, lumbar region  Difficulty walking  Muscle weakness (generalized)     Problem List Patient Active Problem List   Diagnosis Date Noted   Chronic right SI joint pain 19/50/9326   Umbilical hernia without obstruction or gangrene 02/02/2021   Hepatic steatosis 08/05/2020   Aortic atherosclerosis ([place]) 08/04/2020   Complex regional pain syndrome type I of the upper limb 07/26/2020   Flexion contracture of joint of hand 07/26/2020   Pacemaker 07/26/2020   Rib pain on left side 06/09/2020   Decreased pulses in feet 06/09/2020   Erectile dysfunction 03/03/2017   Chronic headaches 02/13/2017   Snoring 02/15/2015   Elevated liver enzymes 09/13/2014   Dupuytren's contracture of both hands 09/02/2013   Stiffness of right hand joint 05/07/2013   Skin tear of hand without complication 71/24/5809   History of gout 12/27/2011   Essential hypertension 01/12/2011   Complete heart block ([place]) 01/12/2011   Hyperglycemia 03/02/2009   HYPERPLASIA PROSTATE UNS W/O UR OBST & OTH LUTS 08/13/2008   Hyperlipidemia 02/17/2008   PAROXYSMAL SUPRAVENTRICULAR TACHYCARDIA 02/17/2008    Carney Living, PT 09/11/2021, 8:58 PM  [place], [place], [address] Phone: [phone]   Fax:  [phone]  Name: KHADIR ROAM MRN: [id number] Date of Birth: 02/19/1946

## 2021-09-12 ENCOUNTER — Encounter (HOSPITAL_BASED_OUTPATIENT_CLINIC_OR_DEPARTMENT_OTHER): Payer: Self-pay | Admitting: Physical Therapy

## 2021-09-12 ENCOUNTER — Other Ambulatory Visit: Payer: Self-pay

## 2021-09-12 ENCOUNTER — Ambulatory Visit (HOSPITAL_BASED_OUTPATIENT_CLINIC_OR_DEPARTMENT_OTHER): Payer: PPO | Admitting: Physical Therapy

## 2021-09-12 DIAGNOSIS — M545 Low back pain, unspecified: Secondary | ICD-10-CM | POA: Diagnosis not present

## 2021-09-12 DIAGNOSIS — R262 Difficulty in walking, not elsewhere classified: Secondary | ICD-10-CM

## 2021-09-12 DIAGNOSIS — M6281 Muscle weakness (generalized): Secondary | ICD-10-CM

## 2021-09-12 NOTE — Therapy (Signed)
[name], [place], [address] Phone: [phone]   Fax:  [phone]  Physical Therapy Treatment  Patient Details  Name: [name] MRN: [id number] Date of Birth: 1946/03/20 Referring Provider (PT): [name]   Encounter Date: 09/12/2021   PT End of Session - 09/12/21 1738     Visit Number 6    Number of Visits 12    Date for PT Re-Evaluation 11/22/21    Authorization Type HTA    PT Start Time 1645    PT Stop Time 1725    PT Time Calculation (min) 40 min    Activity Tolerance Patient tolerated treatment well    Behavior During Therapy [facility] for tasks assessed/performed             Past Medical History:  Diagnosis Date   Complete heart block ([facility])    S/P  PPM by Dr Rayann Heman   Coronary artery disease    Non-obstructive. Pt had left heart catheterization in August 2009 showing a 30% mid-circumflex lesion, otherwise coronaries were clean angiographically   Hyperlipidemia    Hypertension    OSA (obstructive sleep apnea)    Probable; study never scheduled    Past Surgical History:  Procedure Laterality Date   [place], 2009   2009- negative   COLONOSCOPY  2011   negative, [facility]   PACEMAKER INSERTION  01/13/11   MDT by Greggory Brandy for complete heart block    There were no vitals filed for this visit.   Subjective Assessment - 09/12/21 1649     Subjective Pt states no NT down the legs for the past few weeks.    Pertinent History Pacemaker    Limitations Lifting;Standing;Walking;House hold activities    Diagnostic tests Myelogram: IMPRESSION:  1. Advanced lumbar disc and facet degeneration, greatest at L3-4  where there is moderate spinal stenosis and severe right neural  stenosis.  2. Moderate bilateral neural foraminal stenosis at L4-5.  3. Mild spinal stenosis at L2-3.  4. Aortic Atherosclerosis (ICD10-I70.0).    Patient Stated Goals decreased pain , return to golf     Currently in Pain? Yes    Pain Score 1     Pain Location Back    Pain Orientation Right;Left    Pain Descriptors / Indicators Aching;Sore    Pain Onset More than a month ago                                    Lumbar Exercises: Stretches      Lower Trunk Rotation Limitations 10x 3s holds                      Lumbar Exercises: Standing    Other Standing Lumbar Exercises palloff press GTB 2x10;  Rowing GTB 2x10 STS blue TB at knees 10x with ab brace           Lumbar Exercises: Seated    Other Seated Lumbar Exercises seated lumbar flexion stretch fwd and to L 10s 10x each     Other Seated Lumbar Exercises standing QL stretch at doorframe 30s 3x on both           Lumbar Exercises: Supine    Other Supine Lumbar Exercises Bridge  McGill curl up 2s 10x  2x10  Bridge 2x10 3s blue TB at knees LE deadbug 2x10 each  Manual Therapy    Manual therapy comments bilat L/S paraspinals and R QL, lower T/S parspinals on R                PT Education - 09/12/21 1708     Education Details exercise progression, DOMS expectations, muscle firing, HEP, joint protection, postural changes    Person(s) Educated Patient    Methods Explanation;Demonstration;Tactile cues;Verbal cues              PT Short Term Goals - 08/24/21 1344       PT SHORT TERM GOAL #1   Title Pt to be independent with initial HEP    Time 2    Status New      PT SHORT TERM GOAL #2   Title Pt will demonstrate at least a 12.8 improvement in Oswestry Index in order to demonstrate a clinically significant change in LBP and function.    Time 3    Period Weeks    Status New               PT Long Term Goals - 08/26/21 1035       PT LONG TERM GOAL #1   Title Pt to be independent with final HEP    Time 6    Period Weeks    Status Partially Met      PT LONG TERM GOAL #2   Title Pt to report ability for standing, walking, for at least 30 min with pain in lumbar spine  and R LE decreased , to 0-2/10    Time 6    Period Weeks    Status Partially Met      PT LONG TERM GOAL #3   Title Pt will be able to perform 5XSTS in under 12  in order to demonstrate functional improvement above the cut off score for older adults.    Time 6    Period Weeks    Status New      PT LONG TERM GOAL #4   Title Pt will be able to demonstrate/report ability to sit/stand for extended periods of time without pain in order to demonstrate functional improvement and tolerance to static positioning.    Time 6    Period Weeks    Status New                   Plan - 09/12/21 1709     Clinical Impression Statement Pt exercise progressed to include more LE and lumbar stability given recent improvement with pain following injection. Pt without complaint of pain at today's session with improvement in ROM following. Pt now able to tolerate closing pattern in lateral flexion bilaterally without recreation of NT into R LE. Pt with signficant difficulty with LE deadbug exercise due to coordination and endurance deficits. Likely will need review at next session. Plan to continue with lumbopelvic mobility and isometric stabilization as tolerated. Pt would benefit from continued skilled therapy in order to reach goals and maximize functional lumbopelvic and R LE strength for prevention of further functional decline.    Examination-Activity Limitations Lift;Stand;Locomotion Level;Carry;Squat;Bend    Examination-Participation Restrictions Cleaning;Meal Prep;Yard Work;Community Activity    Stability/Clinical Decision Making Stable/Uncomplicated    Rehab Potential Good    PT Frequency 2x / week    PT Duration 6 weeks    PT Treatment/Interventions ADLs/Self Care Home Management;Canalith Repostioning;Cryotherapy;DME Instruction;Traction;Moist Heat;Gait training;Stair training;Functional mobility training;Therapeutic activities;Therapeutic exercise;Balance training;Patient/family  education;Neuromuscular re-education;Manual techniques;Taping;Dry needling;Passive range of motion;Spinal Manipulations;Joint Manipulations;Aquatic Therapy;Dealer  Stimulation        PT Home Exercise Plan [facility]    Consulted and Agree with Plan of Care Patient             Patient will benefit from skilled therapeutic intervention in order to improve the following deficits and impairments:  Pain, Improper body mechanics, Decreased mobility, Hypomobility, Decreased strength, Decreased range of motion, Decreased activity tolerance, Impaired flexibility  Visit Diagnosis: Pain, lumbar region  Difficulty walking  Muscle weakness (generalized)     Problem List Patient Active Problem List   Diagnosis Date Noted   Chronic right SI joint pain 44/17/1278   Umbilical hernia without obstruction or gangrene 02/02/2021   Hepatic steatosis 08/05/2020   Aortic atherosclerosis ([place]) 08/04/2020   Complex regional pain syndrome type I of the upper limb 07/26/2020   Flexion contracture of joint of hand 07/26/2020   Pacemaker 07/26/2020   Rib pain on left side 06/09/2020   Decreased pulses in feet 06/09/2020   Erectile dysfunction 03/03/2017   Chronic headaches 02/13/2017   Snoring 02/15/2015   Elevated liver enzymes 09/13/2014   Dupuytren's contracture of both hands 09/02/2013   Stiffness of right hand joint 05/07/2013   Skin tear of hand without complication 71/83/6725   History of gout 12/27/2011   Essential hypertension 01/12/2011   Complete heart block ([place]) 01/12/2011   Hyperglycemia 03/02/2009   HYPERPLASIA PROSTATE UNS W/O UR OBST & OTH LUTS 08/13/2008   Hyperlipidemia 02/17/2008   PAROXYSMAL SUPRAVENTRICULAR TACHYCARDIA 02/17/2008    Daleen Bo, PT 09/12/2021, 5:39 PM  [facility] [place], [place], [address] Phone: [phone]   Fax:  [phone]  Name: [name] MRN: [id number] Date of Birth:  [birth year]

## 2021-09-15 ENCOUNTER — Encounter (HOSPITAL_BASED_OUTPATIENT_CLINIC_OR_DEPARTMENT_OTHER): Payer: Self-pay | Admitting: Physical Therapy

## 2021-09-15 ENCOUNTER — Other Ambulatory Visit: Payer: Self-pay

## 2021-09-15 ENCOUNTER — Ambulatory Visit (HOSPITAL_BASED_OUTPATIENT_CLINIC_OR_DEPARTMENT_OTHER): Payer: PPO | Attending: Family Medicine | Admitting: Physical Therapy

## 2021-09-15 DIAGNOSIS — M7061 Trochanteric bursitis, right hip: Secondary | ICD-10-CM | POA: Insufficient documentation

## 2021-09-15 DIAGNOSIS — R262 Difficulty in walking, not elsewhere classified: Secondary | ICD-10-CM | POA: Insufficient documentation

## 2021-09-15 DIAGNOSIS — M7062 Trochanteric bursitis, left hip: Secondary | ICD-10-CM | POA: Diagnosis not present

## 2021-09-15 DIAGNOSIS — M6281 Muscle weakness (generalized): Secondary | ICD-10-CM | POA: Diagnosis not present

## 2021-09-15 DIAGNOSIS — M545 Low back pain, unspecified: Secondary | ICD-10-CM | POA: Insufficient documentation

## 2021-09-15 NOTE — Therapy (Signed)
[place], [place], [address] Phone: [phone]   Fax:  [phone]  Physical Therapy Treatment  Patient Details  Name: [name] MRN: [id number] Date of Birth: February 28, 1946 Referring Provider (PT): [name]   Encounter Date: 09/15/2021   PT End of Session - 09/15/21 1149     Visit Number 7    Number of Visits 12    Date for PT Re-Evaluation 11/22/21    Authorization Type HTA    PT Start Time 1146    PT Stop Time 1225    PT Time Calculation (min) 39 min    Activity Tolerance Patient tolerated treatment well    Behavior During Therapy [facility] for tasks assessed/performed             Past Medical History:  Diagnosis Date   Complete heart block (HCC)    S/P  PPM by Dr Rayann Heman   Coronary artery disease    Non-obstructive. Pt had left heart catheterization in August 2009 showing a 30% mid-circumflex lesion, otherwise coronaries were clean angiographically   Hyperlipidemia    Hypertension    OSA (obstructive sleep apnea)    Probable; study never scheduled    Past Surgical History:  Procedure Laterality Date   [place], 2009   2009- negative   COLONOSCOPY  2011   negative, [facility]   PACEMAKER INSERTION  01/13/11   MDT by Greggory Brandy for complete heart block    There were no vitals filed for this visit.   Subjective Assessment - 09/15/21 1149     Subjective i was not sore the first day but I was the second after the last visit.    Patient Stated Goals decreased pain , return to golf    Currently in Pain? Yes    Pain Score 3     Pain Location Back    Pain Orientation Right    Pain Descriptors / Indicators Aching;Sore    Pain Radiating Towards nothing into LE    Aggravating Factors  stairs    Pain Relieving Factors laying supine                               [facility] Adult PT Treatment/Exercise - 09/15/21 0001       Lumbar Exercises: Stretches    Passive Hamstring Stretch Limitations seated bil 30s    Piriformis Stretch Limitations seated bil 30s      Lumbar Exercises: Seated   Other Seated Lumbar Exercises seated ab set    Other Seated Lumbar Exercises seated hip hinge via physioball roll out      Lumbar Exercises: Supine   AB Set Limitations tactile cues for transv abdominis & oblique engagement    Bent Knee Raise Limitations ab set+alt hip flexion      Manual Therapy   Manual therapy comments skilled palpation and monitoring during TPDN    Soft tissue mobilization to Rt hip musculature following DN              Trigger Point Dry Needling - 09/15/21 0001     Muscles Treated Back/Hip Gluteus minimus;Gluteus medius;Gluteus maximus;Tensor fascia lata    Other Dry Needling all on Rt    Gluteus Minimus Response Twitch response elicited;Palpable increased muscle length    Gluteus Medius Response Twitch response elicited;Palpable increased muscle length    Gluteus Maximus Response Twitch response elicited;Palpable increased muscle length  Tensor Fascia Lata Response Twitch response elicited;Palpable increased muscle length                     PT Short Term Goals - 08/24/21 1344       PT SHORT TERM GOAL #1   Title Pt to be independent with initial HEP    Time 2    Status New      PT SHORT TERM GOAL #2   Title Pt will demonstrate at least a 12.8 improvement in Oswestry Index in order to demonstrate a clinically significant change in LBP and function.    Time 3    Period Weeks    Status New               PT Long Term Goals - 08/26/21 1035       PT LONG TERM GOAL #1   Title Pt to be independent with final HEP    Time 6    Period Weeks    Status Partially Met      PT LONG TERM GOAL #2   Title Pt to report ability for standing, walking, for at least 30 min with pain in lumbar spine and R LE decreased , to 0-2/10    Time 6    Period Weeks    Status Partially Met      PT LONG TERM GOAL #3    Title Pt will be able to perform 5XSTS in under 12  in order to demonstrate functional improvement above the cut off score for older adults.    Time 6    Period Weeks    Status New      PT LONG TERM GOAL #4   Title Pt will be able to demonstrate/report ability to sit/stand for extended periods of time without pain in order to demonstrate functional improvement and tolerance to static positioning.    Time 6    Period Weeks    Status New                   Plan - 09/15/21 1231     Clinical Impression Statement Required cues for proper core engagement as he was utilizing lumbar extensors rather than engaging TrA. DN was successful in reducing concordant pain he c/o upon arrival. Encouraged him to continue moving and stretching to reduce soreness.    PT Treatment/Interventions ADLs/Self Care Home Management;Canalith Repostioning;Cryotherapy;DME Instruction;Traction;Moist Heat;Gait training;Stair training;Functional mobility training;Therapeutic activities;Therapeutic exercise;Balance training;Patient/family education;Neuromuscular re-education;Manual techniques;Taping;Dry needling;Passive range of motion;Spinal Manipulations;Joint Manipulations;Aquatic Therapy;Electrical Stimulation    PT Next Visit Plan give oswestry, continue hip hinge/flexion stretching, coordinated core engagement    PT Home Exercise Plan [facility]    Consulted and Agree with Plan of Care Patient             Patient will benefit from skilled therapeutic intervention in order to improve the following deficits and impairments:  Pain, Improper body mechanics, Decreased mobility, Hypomobility, Decreased strength, Decreased range of motion, Decreased activity tolerance, Impaired flexibility  Visit Diagnosis: Pain, lumbar region  Difficulty walking  Muscle weakness (generalized)     Problem List Patient Active Problem List   Diagnosis Date Noted   Chronic right SI joint pain 40/98/1191   Umbilical hernia  without obstruction or gangrene 02/02/2021   Hepatic steatosis 08/05/2020   Aortic atherosclerosis ([place]) 08/04/2020   Complex regional pain syndrome type I of the upper limb 07/26/2020   Flexion contracture of joint of hand 07/26/2020   Pacemaker 07/26/2020  Rib pain on left side 06/09/2020   Decreased pulses in feet 06/09/2020   Erectile dysfunction 03/03/2017   Chronic headaches 02/13/2017   Snoring 02/15/2015   Elevated liver enzymes 09/13/2014   Dupuytren's contracture of both hands 09/02/2013   Stiffness of right hand joint 05/07/2013   Skin tear of hand without complication 28/83/3744   History of gout 12/27/2011   Essential hypertension 01/12/2011   Complete heart block ([place]) 01/12/2011   Hyperglycemia 03/02/2009   HYPERPLASIA PROSTATE UNS W/O UR OBST & OTH LUTS 08/13/2008   Hyperlipidemia 02/17/2008   PAROXYSMAL SUPRAVENTRICULAR TACHYCARDIA 02/17/2008  Arshdeep Bolger C. Loriana Samad PT, DPT 09/15/21 12:34 PM   [facility] [place], [place], [address] Phone: [phone]   Fax:  [phone]  Name: JOAOVICTOR KRONE MRN: [id number] Date of Birth: November 03, 1946

## 2021-09-19 ENCOUNTER — Ambulatory Visit (HOSPITAL_BASED_OUTPATIENT_CLINIC_OR_DEPARTMENT_OTHER): Payer: PPO | Admitting: Physical Therapy

## 2021-09-19 ENCOUNTER — Other Ambulatory Visit: Payer: Self-pay

## 2021-09-19 ENCOUNTER — Encounter (HOSPITAL_BASED_OUTPATIENT_CLINIC_OR_DEPARTMENT_OTHER): Payer: Self-pay | Admitting: Physical Therapy

## 2021-09-19 DIAGNOSIS — M6281 Muscle weakness (generalized): Secondary | ICD-10-CM

## 2021-09-19 DIAGNOSIS — M545 Low back pain, unspecified: Secondary | ICD-10-CM

## 2021-09-19 DIAGNOSIS — R262 Difficulty in walking, not elsewhere classified: Secondary | ICD-10-CM

## 2021-09-19 NOTE — Therapy (Signed)
[name], [place], [address] Phone: [phone]   Fax:  [phone]  Physical Therapy Treatment  Patient Details  Name: [name] MRN: [id number] Date of Birth: 1946-06-16 Referring Provider (PT): [name]   Encounter Date: 09/19/2021   PT End of Session - 09/19/21 1515     Visit Number 8    Number of Visits 12    Date for PT Re-Evaluation 11/22/21    Authorization Type HTA    PT Start Time 1514    PT Stop Time 1553    PT Time Calculation (min) 39 min    Activity Tolerance Patient tolerated treatment well    Behavior During Therapy [facility] for tasks assessed/performed             Past Medical History:  Diagnosis Date   Complete heart block (HCC)    S/P  PPM by Dr Rayann Heman   Coronary artery disease    Non-obstructive. Pt had left heart catheterization in August 2009 showing a 30% mid-circumflex lesion, otherwise coronaries were clean angiographically   Hyperlipidemia    Hypertension    OSA (obstructive sleep apnea)    Probable; study never scheduled    Past Surgical History:  Procedure Laterality Date   [place], 2009   2009- negative   COLONOSCOPY  2011   negative, [facility]   PACEMAKER INSERTION  01/13/11   MDT by Greggory Brandy for complete heart block    There were no vitals filed for this visit.   Subjective Assessment - 09/19/21 1517     Subjective Pt states he was not sore after needling last session. He had one bad day over the weekend but it has been good otherwise.    Diagnostic tests Myelogram: IMPRESSION:  1. Advanced lumbar disc and facet degeneration, greatest at L3-4  where there is moderate spinal stenosis and severe right neural  stenosis.  2. Moderate bilateral neural foraminal stenosis at L4-5.  3. Mild spinal stenosis at L2-3.  4. Aortic Atherosclerosis (ICD10-I70.0).    Patient Stated Goals decreased pain , return to golf    Currently in Pain?  No/denies    Pain Score 0-No pain                         Lumbar Exercises: Stretches      Lower Trunk Rotation Limitations 10x 3s holds L only                         Lumbar Exercises: Standing    Other Standing Lumbar Exercises  STS with focus on hip hinging 15x from mid thigh table height           Lumbar Exercises: Seated    Other Seated Lumbar Exercises seated lumbar flexion stretch fwd and to L 10s 10x each     Other Seated Lumbar Exercises standing QL stretch at doorframe 30s 3x on both Seated QL stretch on both 30s 2x           Lumbar Exercises: Supine    Other Supine Lumbar Exercises Bridge  McGill curl up 2s 10x  Mcgill curll up with SLR  10x each Bridge 2x10 3s  LE deadbug 2x10 each           Manual Therapy    Manual therapy comments bilat L/S paraspinals and R QL, lower T/S parspinals on R  PT Education - 09/19/21 1536     Education Details exercise progression, technique and form    Person(s) Educated Patient    Methods Demonstration;Explanation    Comprehension Verbalized understanding;Returned demonstration;Tactile cues required;Verbal cues required              PT Short Term Goals - 08/24/21 1344       PT SHORT TERM GOAL #1   Title Pt to be independent with initial HEP    Time 2    Status New      PT SHORT TERM GOAL #2   Title Pt will demonstrate at least a 12.8 improvement in Oswestry Index in order to demonstrate a clinically significant change in LBP and function.    Time 3    Period Weeks    Status New               PT Long Term Goals - 08/26/21 1035       PT LONG TERM GOAL #1   Title Pt to be independent with final HEP    Time 6    Period Weeks    Status Partially Met      PT LONG TERM GOAL #2   Title Pt to report ability for standing, walking, for at least 30 min with pain in lumbar spine and R LE decreased , to 0-2/10    Time 6    Period Weeks    Status  Partially Met      PT LONG TERM GOAL #3   Title Pt will be able to perform 5XSTS in under 12  in order to demonstrate functional improvement above the cut off score for older adults.    Time 6    Period Weeks    Status New      PT LONG TERM GOAL #4   Title Pt will be able to demonstrate/report ability to sit/stand for extended periods of time without pain in order to demonstrate functional improvement and tolerance to static positioning.    Time 6    Period Weeks    Status New                   Plan - 09/19/21 1519     Clinical Impression Statement Pt exercise program progressed with increased difficulty, especially with lumbopelvic stability exercise. LE movements with abdominal contraction requires VC for diaphragmatic breathing. Pt is able to improve lumbar mobility at this time, though appeared to have more R sided irritation today due to pain with LTR. VC and TC required today for hip hinging due to continued stiffness. Plan to trial lifting mechanics at next session to help reinforce hip hinge motion.  Pt would benefit from continued skilled therapy in order to reach goals and maximize functional lumbopelvic and R LE strength for prevention of further functional decline.    Examination-Activity Limitations Lift;Stand;Locomotion Level;Carry;Squat;Bend    Examination-Participation Restrictions Cleaning;Meal Prep;Yard Work;Community Activity    Stability/Clinical Decision Making Stable/Uncomplicated    Rehab Potential Good    PT Frequency 2x / week    PT Duration 6 weeks    PT Treatment/Interventions ADLs/Self Care Home Management;Canalith Repostioning;Cryotherapy;DME Instruction;Traction;Moist Heat;Gait training;Stair training;Functional mobility training;Therapeutic activities;Therapeutic exercise;Balance training;Patient/family education;Neuromuscular re-education;Manual techniques;Taping;Dry needling;Passive range of motion;Spinal Manipulations;Joint Manipulations;Aquatic  Therapy;Electrical Stimulation    PT Next Visit Plan review HEP    PT Home Exercise Plan [facility]    Consulted and Agree with Plan of Care Patient  Patient will benefit from skilled therapeutic intervention in order to improve the following deficits and impairments:  Pain, Improper body mechanics, Decreased mobility, Hypomobility, Decreased strength, Decreased range of motion, Decreased activity tolerance, Impaired flexibility  Visit Diagnosis: Pain, lumbar region  Muscle weakness (generalized)  Difficulty walking     Problem List Patient Active Problem List   Diagnosis Date Noted   Chronic right SI joint pain 97/53/0051   Umbilical hernia without obstruction or gangrene 02/02/2021   Hepatic steatosis 08/05/2020   Aortic atherosclerosis ([place]) 08/04/2020   Complex regional pain syndrome type I of the upper limb 07/26/2020   Flexion contracture of joint of hand 07/26/2020   Pacemaker 07/26/2020   Rib pain on left side 06/09/2020   Decreased pulses in feet 06/09/2020   Erectile dysfunction 03/03/2017   Chronic headaches 02/13/2017   Snoring 02/15/2015   Elevated liver enzymes 09/13/2014   Dupuytren's contracture of both hands 09/02/2013   Stiffness of right hand joint 05/07/2013   Skin tear of hand without complication 09/02/1172   History of gout 12/27/2011   Essential hypertension 01/12/2011   Complete heart block ([place]) 01/12/2011   Hyperglycemia 03/02/2009   HYPERPLASIA PROSTATE UNS W/O UR OBST & OTH LUTS 08/13/2008   Hyperlipidemia 02/17/2008   PAROXYSMAL SUPRAVENTRICULAR TACHYCARDIA 02/17/2008    Daleen Bo, PT 09/19/2021, 3:57 PM  [facility] [place], [place], [address] Phone: [phone]   Fax:  [phone]  Name: [name] MRN: [id number] Date of Birth: October 02, 1946

## 2021-09-23 ENCOUNTER — Ambulatory Visit (HOSPITAL_BASED_OUTPATIENT_CLINIC_OR_DEPARTMENT_OTHER): Payer: PPO | Admitting: Physical Therapy

## 2021-09-23 ENCOUNTER — Encounter (HOSPITAL_BASED_OUTPATIENT_CLINIC_OR_DEPARTMENT_OTHER): Payer: Self-pay | Admitting: Physical Therapy

## 2021-09-23 ENCOUNTER — Other Ambulatory Visit: Payer: Self-pay

## 2021-09-23 DIAGNOSIS — R262 Difficulty in walking, not elsewhere classified: Secondary | ICD-10-CM

## 2021-09-23 DIAGNOSIS — M6281 Muscle weakness (generalized): Secondary | ICD-10-CM

## 2021-09-23 DIAGNOSIS — M545 Low back pain, unspecified: Secondary | ICD-10-CM | POA: Diagnosis not present

## 2021-09-23 NOTE — Therapy (Signed)
[place], [place], [address] Phone: [phone]   Fax:  [phone]  Physical Therapy Treatment  Patient Details  Name: [name] MRN: [id number] Date of Birth: 1946-04-24 Referring Provider (PT): [name]   Encounter Date: 09/23/2021   PT End of Session - 09/23/21 1125     Visit Number 9    Number of Visits 12    Date for PT Re-Evaluation 11/22/21    Authorization Type HTA    PT Start Time 1105    PT Stop Time 1135    PT Time Calculation (min) 30 min    Activity Tolerance Patient tolerated treatment well    Behavior During Therapy [facility] for tasks assessed/performed             Past Medical History:  Diagnosis Date   Complete heart block (HCC)    S/P  PPM by Dr Rayann Heman   Coronary artery disease    Non-obstructive. Pt had left heart catheterization in August 2009 showing a 30% mid-circumflex lesion, otherwise coronaries were clean angiographically   Hyperlipidemia    Hypertension    OSA (obstructive sleep apnea)    Probable; study never scheduled    Past Surgical History:  Procedure Laterality Date   [place], 2009   2009- negative   COLONOSCOPY  2011   negative, [facility]   PACEMAKER INSERTION  01/13/11   MDT by Greggory Brandy for complete heart block    There were no vitals filed for this visit.   Subjective Assessment - 09/23/21 1102     Subjective Pt states that he had to use ice more the last two days. He went to convention and had to walk more than usual. He also carried heavier water coming into the house. He does feel some pain down the R calf.    Diagnostic tests Myelogram: IMPRESSION:  1. Advanced lumbar disc and facet degeneration, greatest at L3-4  where there is moderate spinal stenosis and severe right neural  stenosis.  2. Moderate bilateral neural foraminal stenosis at L4-5.  3. Mild spinal stenosis at L2-3.  4. Aortic Atherosclerosis (ICD10-I70.0).     Patient Stated Goals decreased pain , return to golf    Currently in Pain? Yes    Pain Score 2     Pain Location Back    Pain Orientation Right    Pain Descriptors / Indicators Aching;Sore                Lumbar Exercises: Stretches      PPT 20x 2s                         Lumbar Exercises: Standing    Other Standing Lumbar Exercises   Lifting mechanics review and analysis; 15 KB RDL/lift from table height, cuing for hip extension and hip hinge           Lumbar Exercises: Seated    Other Seated Lumbar Exercises standing QL stretch at doorframe 30s 3x on both Seated QL stretch on both 30s 2x           Lumbar Exercises: Supine    Other Supine Lumbar Exercises Bridge  McGill curl up 2s 10x  Mcgill curll up with SLR  10x each            Manual Therapy    Manual therapy comments bilat L/S paraspinals and R QL, lower T/S parspinals on R  PT Education - 09/23/21 1140     Education Details lifting mechanics/technique, exercise progression, technique and form    Person(s) Educated Patient    Methods Demonstration;Explanation    Comprehension Verbalized understanding;Returned demonstration              PT Short Term Goals - 08/24/21 1344       PT SHORT TERM GOAL #1   Title Pt to be independent with initial HEP    Time 2    Status New      PT SHORT TERM GOAL #2   Title Pt will demonstrate at least a 12.8 improvement in Oswestry Index in order to demonstrate a clinically significant change in LBP and function.    Time 3    Period Weeks    Status New               PT Long Term Goals - 08/26/21 1035       PT LONG TERM GOAL #1   Title Pt to be independent with final HEP    Time 6    Period Weeks    Status Partially Met      PT LONG TERM GOAL #2   Title Pt to report ability for standing, walking, for at least 30 min with pain in lumbar spine and R LE decreased , to 0-2/10    Time 6    Period Weeks    Status Partially Met       PT LONG TERM GOAL #3   Title Pt will be able to perform 5XSTS in under 12  in order to demonstrate functional improvement above the cut off score for older adults.    Time 6    Period Weeks    Status New      PT LONG TERM GOAL #4   Title Pt will be able to demonstrate/report ability to sit/stand for extended periods of time without pain in order to demonstrate functional improvement and tolerance to static positioning.    Time 6    Period Weeks    Status New                   Plan - 09/23/21 1120     Clinical Impression Statement P presents with increased pain at today's session with increased lumbar paraspinals stiffness that is likely due to report of increased stnading/walking and lifting activity. Pt able to return demo lifting mechanics and hip hinging motions with increased VC and TC throughout. Pt continues to have with difficulty with lumbopelvic dissociation. LE movements with abdominal contraction requires VC for diaphragmatic breathing. If pain is well controlled at next session, continue with lifting mechancis and postural endurance exercise in order to prevent future exacerbations. Pt would benefit from continued skilled therapy in order to reach goals and maximize functional lumbopelvic and R LE strength for prevention of further functional decline.    Examination-Activity Limitations Lift;Stand;Locomotion Level;Carry;Squat;Bend    Examination-Participation Restrictions Cleaning;Meal Prep;Yard Work;Community Activity    Stability/Clinical Decision Making Stable/Uncomplicated    Rehab Potential Good    PT Frequency 2x / week    PT Duration 6 weeks    PT Treatment/Interventions ADLs/Self Care Home Management;Canalith Repostioning;Cryotherapy;DME Instruction;Traction;Moist Heat;Gait training;Stair training;Functional mobility training;Therapeutic activities;Therapeutic exercise;Balance training;Patient/family education;Neuromuscular re-education;Manual  techniques;Taping;Dry needling;Passive range of motion;Spinal Manipulations;Joint Manipulations;Aquatic Therapy;Electrical Stimulation    PT Next Visit Plan review HEP    PT Home Exercise Plan [facility]    Consulted and Agree with Plan of Care Patient  Patient will benefit from skilled therapeutic intervention in order to improve the following deficits and impairments:  Pain, Improper body mechanics, Decreased mobility, Hypomobility, Decreased strength, Decreased range of motion, Decreased activity tolerance, Impaired flexibility  Visit Diagnosis: Pain, lumbar region  Muscle weakness (generalized)  Difficulty walking     Problem List Patient Active Problem List   Diagnosis Date Noted   Chronic right SI joint pain 86/76/1950   Umbilical hernia without obstruction or gangrene 02/02/2021   Hepatic steatosis 08/05/2020   Aortic atherosclerosis ([place]) 08/04/2020   Complex regional pain syndrome type I of the upper limb 07/26/2020   Flexion contracture of joint of hand 07/26/2020   Pacemaker 07/26/2020   Rib pain on left side 06/09/2020   Decreased pulses in feet 06/09/2020   Erectile dysfunction 03/03/2017   Chronic headaches 02/13/2017   Snoring 02/15/2015   Elevated liver enzymes 09/13/2014   Dupuytren's contracture of both hands 09/02/2013   Stiffness of right hand joint 05/07/2013   Skin tear of hand without complication 93/26/7124   History of gout 12/27/2011   Essential hypertension 01/12/2011   Complete heart block ([place]) 01/12/2011   Hyperglycemia 03/02/2009   HYPERPLASIA PROSTATE UNS W/O UR OBST & OTH LUTS 08/13/2008   Hyperlipidemia 02/17/2008   PAROXYSMAL SUPRAVENTRICULAR TACHYCARDIA 02/17/2008    Daleen Bo, PT 09/23/2021, 11:43 AM  [place], [place], [address] Phone: [phone]   Fax:  [phone]  Name: [name] MRN: [id number] Date of Birth:  [birth year]

## 2021-09-27 ENCOUNTER — Ambulatory Visit (HOSPITAL_BASED_OUTPATIENT_CLINIC_OR_DEPARTMENT_OTHER): Payer: PPO | Admitting: Physical Therapy

## 2021-09-28 ENCOUNTER — Ambulatory Visit (INDEPENDENT_AMBULATORY_CARE_PROVIDER_SITE_OTHER): Payer: PPO

## 2021-09-28 DIAGNOSIS — I442 Atrioventricular block, complete: Secondary | ICD-10-CM

## 2021-09-28 LAB — CUP PACEART REMOTE DEVICE CHECK
Battery Impedance: 2204 Ohm
Battery Remaining Longevity: 30 mo
Battery Voltage: 2.74 V
Brady Statistic AP VP Percent: 4 %
Brady Statistic AP VS Percent: 0 %
Brady Statistic AS VP Percent: 96 %
Brady Statistic AS VS Percent: 0 %
Date Time Interrogation Session: 20221116083616
Implantable Lead Implant Date: 20120302
Implantable Lead Implant Date: 20120302
Implantable Lead Location: 753859
Implantable Lead Location: 753860
Implantable Lead Model: 5076
Implantable Pulse Generator Implant Date: 20120302
Lead Channel Impedance Value: 523 Ohm
Lead Channel Impedance Value: 639 Ohm
Lead Channel Pacing Threshold Amplitude: 0.5 V
Lead Channel Pacing Threshold Amplitude: 0.625 V
Lead Channel Pacing Threshold Pulse Width: 0.4 ms
Lead Channel Pacing Threshold Pulse Width: 0.4 ms
Lead Channel Setting Pacing Amplitude: 2 V
Lead Channel Setting Pacing Amplitude: 2.5 V
Lead Channel Setting Pacing Pulse Width: 0.4 ms
Lead Channel Setting Sensing Sensitivity: 4 mV

## 2021-10-04 ENCOUNTER — Ambulatory Visit (HOSPITAL_BASED_OUTPATIENT_CLINIC_OR_DEPARTMENT_OTHER): Payer: PPO | Admitting: Physical Therapy

## 2021-10-04 ENCOUNTER — Other Ambulatory Visit: Payer: Self-pay

## 2021-10-04 ENCOUNTER — Encounter (HOSPITAL_BASED_OUTPATIENT_CLINIC_OR_DEPARTMENT_OTHER): Payer: Self-pay | Admitting: Physical Therapy

## 2021-10-04 DIAGNOSIS — M545 Low back pain, unspecified: Secondary | ICD-10-CM

## 2021-10-04 DIAGNOSIS — R262 Difficulty in walking, not elsewhere classified: Secondary | ICD-10-CM

## 2021-10-04 DIAGNOSIS — M6281 Muscle weakness (generalized): Secondary | ICD-10-CM

## 2021-10-04 NOTE — Patient Instructions (Signed)
Access Code: [facility] URL: [email or web address] Date: 10/04/2021 Prepared by: Daleen Bo  Exercises Supine Piriformis Stretch with Foot on Ground - 1 x daily - 7 x weekly - 3 sets - 10 reps Neutral Lumbar Spine Curl Up - 2 x daily - 7 x weekly - 2 sets - 10 reps - 2 hold Seated Quadratus Lumborum Stretch in Chair - 1 x daily - 7 x weekly - 1 sets - 3 reps - 30 hold Supine Bridge - 1 x daily - 7 x weekly - 2 sets - 10 reps Sit to Stand - 1 x daily - 7 x weekly - 1 sets - 10 reps

## 2021-10-04 NOTE — Therapy (Signed)
[facility] [place], [place], [address] Phone: [phone]   Fax:  [phone]  Physical Therapy Progress Note  Patient Details  Name: [name] MRN: [id number] Date of Birth: 1946-08-26 Referring Provider (PT): [name]   Encounter Date: 10/04/2021   PT End of Session - 10/04/21 0936     Visit Number 10    Number of Visits 12    Date for PT Re-Evaluation 11/22/21    Authorization Type HTA    PT Start Time 0930    PT Stop Time 1010    PT Time Calculation (min) 40 min    Activity Tolerance Patient tolerated treatment well    Behavior During Therapy [facility] for tasks assessed/performed             Past Medical History:  Diagnosis Date   Complete heart block (HCC)    S/P  PPM by Dr Rayann Heman   Coronary artery disease    Non-obstructive. Pt had left heart catheterization in August 2009 showing a 30% mid-circumflex lesion, otherwise coronaries were clean angiographically   Hyperlipidemia    Hypertension    OSA (obstructive sleep apnea)    Probable; study never scheduled    Past Surgical History:  Procedure Laterality Date   [place], 2009   2009- negative   COLONOSCOPY  2011   negative, [facility]   PACEMAKER INSERTION  01/13/11   MDT by Greggory Brandy for complete heart block    There were no vitals filed for this visit.   Subjective Assessment - 10/04/21 1048     Subjective Pt reports no longer have NT down the legs but walking for long distances or carrying items will still cause back pain. Overall, he feels that he has improved.    Diagnostic tests Myelogram: IMPRESSION:  1. Advanced lumbar disc and facet degeneration, greatest at L3-4  where there is moderate spinal stenosis and severe right neural  stenosis.  2. Moderate bilateral neural foraminal stenosis at L4-5.  3. Mild spinal stenosis at L2-3.  4. Aortic Atherosclerosis (ICD10-I70.0).    Patient Stated Goals decreased pain ,  return to golf    Currently in Pain? Yes    Pain Location Back    Pain Orientation Right    Pain Descriptors / Indicators Aching;Sore    Pain Type Chronic pain                [facility] PT Assessment - 10/04/21 0001       Assessment   Medical Diagnosis Low Back Pain    Referring Provider (PT) [name]    Prior Therapy continued from previous episode      Precautions   Precautions ICD/Pacemaker    Precaution Comments pacemaker      Restrictions   Weight Bearing Restrictions No      Balance Screen   Has the patient fallen in the past 6 months No    Has the patient had a decrease in activity level because of a fear of falling?  No    Is the patient reluctant to leave their home because of a fear of falling?  No      Home Ecologist residence      Prior Function   Level of Independence Independent      Cognition   Overall Cognitive Status Within Functional Limits for tasks assessed      Observation/Other Assessments   Other Surveys  Modified  Oswestry    Modified Oswestry 12 / 50 or 20 %      Functional Tests   Functional tests Sit to Stand   5XSTS 17.2s     Posture/Postural Control   Posture/Postural Control Postural limitations    Postural Limitations Rounded Shoulders;Increased thoracic kyphosis;Decreased lumbar lordosis;Flexed trunk      AROM   Overall AROM Comments Lumbar: flexion 70%, ext 10%, L rot 70%, R rot 50% ,L SB 50%, R SB 50%; able to cross and uncross legs in fig 4      Strength   Overall Strength Comments Hips throughout: 4+/5, Knee: 5/5,      Palpation   Palpation comment TTP in R low lumbar region, into R SI      Ambulation/Gait   Ambulation/Gait Yes    Ambulation Distance (Feet) 40 Feet    Assistive device None    Gait Pattern Decreased stride length;Decreased trunk rotation;Trunk flexed                    Lumbar Exercises: Stretches                                Lumbar Exercises: Standing     Other Standing Lumbar Exercises   15lb KB offset farmer's carry 73f x2     Seated  alt UE/LE marching with abdominal brace 2x10                    Lumbar Exercises: Supine    Other Supine Lumbar Exercises    Bridge  McGill curl up 2s 10x  Mcgill curll up with SLR  10x each  10lb KB across lap x10           Manual Therapy    Manual therapy comments bilat L/S paraspinals and R QL, lower T/S parspinals on R           [facility] Adult PT Treatment/Exercise - 10/04/21 0001       Lumbar Exercises: Standing   Other Standing Lumbar Exercises offset farmer's carry 15lb KB 535fx2   each hand           Access Code: [email or web address] [email or web address] Surgery [email or web address]: [email or web address] Date: 10/04/2021 Prepared by: AlDaleen BoExercises Supine Piriformis Stretch with Foot on Ground - 1 x daily - 7 x weekly - 3 sets - 10 reps Neutral Lumbar Spine Curl Up - 2 x daily - 7 x weekly - 2 sets - 10 reps - 2 hold Seated Quadratus Lumborum Stretch in Chair - 1 x daily - 7 x weekly - 1 sets - 3 reps - 30 hold Supine Bridge - 1 x daily - 7 x weekly - 2 sets - 10 reps Sit to Stand - 1 x daily - 7 x weekly - 1 sets - 10 reps          PT Education - 10/04/21 1016     Education Details lifting mechanics/technique, exercise progression, technique and form, HEP update    Person(s) Educated Patient    Methods Explanation;Demonstration;Tactile cues;Verbal cues;Handout    Comprehension Verbalized understanding;Returned demonstration              PT Short Term Goals - 10/04/21 1015       PT SHORT TERM GOAL #1   Title Pt to be independent with initial HEP    Time 2    Status Partially Met  PT SHORT TERM GOAL #2   Title Pt will demonstrate at least a 12.8 improvement in Oswestry Index in order to demonstrate a clinically significant change in LBP and function.    Time 3    Period Weeks    Status On-going               PT Long Term Goals - 10/04/21 1015       PT LONG TERM GOAL  #1   Title Pt to be independent with final HEP    Time 6    Period Weeks    Status Partially Met      PT LONG TERM GOAL #2   Title Pt to report ability for standing, walking, for at least 30 min with pain in lumbar spine and R LE decreased , to 0-2/10    Time 6    Period Weeks    Status Partially Met      PT LONG TERM GOAL #3   Title Pt will be able to perform 5XSTS in under 12  in order to demonstrate functional improvement above the cut off score for older adults.    Time 6    Period Weeks    Status On-going      PT LONG TERM GOAL #4   Title Pt will be able to demonstrate/report ability to sit/stand for extended periods of time without pain in order to demonstrate functional improvement and tolerance to static positioning.    Time 6    Period Weeks    Status Partially Met                   Plan - 10/04/21 1046     Clinical Impression Statement Pt able to progress core strengthening exercise at today's session and improve tolerance to external loading. With pts's report of increased LBP with lifting, it is likely due to functional strength and capacity deficits. Pt has made mild improvements in ROM but is most limited in strength. HEP has been updated at this time to transition to more strength training. Pt would benefit from continued skilled therapy in order to reach goals and maximize functional lumbopelvic and R LE strength for prevention of further functional decline.    Examination-Activity Limitations Lift;Stand;Locomotion Level;Carry;Squat;Bend    Examination-Participation Restrictions Cleaning;Meal Prep;Yard Work;Community Activity    Stability/Clinical Decision Making Stable/Uncomplicated    Rehab Potential Good    PT Frequency 2x / week    PT Duration 6 weeks    PT Treatment/Interventions ADLs/Self Care Home Management;Canalith Repostioning;Cryotherapy;DME Instruction;Traction;Moist Heat;Gait training;Stair training;Functional mobility training;Therapeutic  activities;Therapeutic exercise;Balance training;Patient/family education;Neuromuscular re-education;Manual techniques;Taping;Dry needling;Passive range of motion;Spinal Manipulations;Joint Manipulations;Aquatic Therapy;Electrical Stimulation    PT Next Visit Plan review HEP    PT Home Exercise Plan [facility]    Consulted and Agree with Plan of Care Patient             Patient will benefit from skilled therapeutic intervention in order to improve the following deficits and impairments:  Pain, Improper body mechanics, Decreased mobility, Hypomobility, Decreased strength, Decreased range of motion, Decreased activity tolerance, Impaired flexibility  Visit Diagnosis: Pain, lumbar region  Muscle weakness (generalized)  Difficulty walking     Problem List Patient Active Problem List   Diagnosis Date Noted   Chronic right SI joint pain 12/75/1700   Umbilical hernia without obstruction or gangrene 02/02/2021   Hepatic steatosis 08/05/2020   Aortic atherosclerosis ([place]) 08/04/2020   Complex regional pain syndrome type I of the upper limb  07/26/2020   Flexion contracture of joint of hand 07/26/2020   Pacemaker 07/26/2020   Rib pain on left side 06/09/2020   Decreased pulses in feet 06/09/2020   Erectile dysfunction 03/03/2017   Chronic headaches 02/13/2017   Snoring 02/15/2015   Elevated liver enzymes 09/13/2014   Dupuytren's contracture of both hands 09/02/2013   Stiffness of right hand joint 05/07/2013   Skin tear of hand without complication 82/50/0370   History of gout 12/27/2011   Essential hypertension 01/12/2011   Complete heart block ([place]) 01/12/2011   Hyperglycemia 03/02/2009   HYPERPLASIA PROSTATE UNS W/O UR OBST & OTH LUTS 08/13/2008   Hyperlipidemia 02/17/2008   PAROXYSMAL SUPRAVENTRICULAR TACHYCARDIA 02/17/2008    Daleen Bo, PT 10/04/2021, 10:49 AM  [place], [place],  [address] Phone: [phone]   Fax:  [phone]  Name: [name] MRN: [id number] Date of Birth: [birth year]

## 2021-10-05 NOTE — Progress Notes (Signed)
Remote pacemaker transmission.   

## 2021-10-10 ENCOUNTER — Other Ambulatory Visit: Payer: Self-pay

## 2021-10-10 ENCOUNTER — Ambulatory Visit (INDEPENDENT_AMBULATORY_CARE_PROVIDER_SITE_OTHER): Payer: PPO | Admitting: Family Medicine

## 2021-10-10 VITALS — BP 118/80 | HR 94 | Ht 77.0 in | Wt 233.0 lb

## 2021-10-10 DIAGNOSIS — M5441 Lumbago with sciatica, right side: Secondary | ICD-10-CM

## 2021-10-10 DIAGNOSIS — G8929 Other chronic pain: Secondary | ICD-10-CM

## 2021-10-10 NOTE — Patient Instructions (Addendum)
Thank you for coming in today.   Please call [facility] at [phone] to schedule your spine injection.    Enjoy your travels!  Recheck back as needed

## 2021-10-10 NOTE — Progress Notes (Signed)
I, [name], LAT, ATC acting as a scribe for [name], MD.  [name] is a 75 y.o. male who presents to [place] [facility] today for f/u of R-sided LBP  due to spinal stenosis w/ radiating pain into his R LE to his post calf and bilat hip pain due to greater trochanteric bursitis and hip abductor tendinopathy.  Pt's last lumbar ESI was performed on 08/22/21. He was last seen by Dr. Georgina Snell on 09/06/21 and was given bilat GT steroid injection and hip PT was added to the existing order, of which he's completed 10 total visits. Today, pt reports hasn't has as many "bad days" and isn't needing to ice as much, but the LBP still isn't where he would like. Pt notes he is traveling out of town for about 1 month and plans on doing a lot of walking.  Pain is predominantly right lower back.  No radiating pain.  Dx imaging: 08/10/21 L-spine CT  05/11/21 R hip & L-spine XR  Pertinent review of systems: No fevers or chills  Relevant historical information: Heart block.   Exam:  BP 118/80   Pulse 94   Ht 6\' 5"  (1.956 m)   Wt 233 lb (105.7 kg)   SpO2 97%   BMI 27.63 kg/m  General: Well Developed, well nourished, and in no acute distress.   MSK: L-spine nontender midline.  Decreased lumbar motion.    Lab and Radiology Results  EXAM: LUMBAR MYELOGRAM   FLUOROSCOPY TIME:  Fluoroscopy Time: 48 seconds   Radiation Exposure Index: 381.25 microGray*m^2   PROCEDURE: After thorough discussion of risks and benefits of the procedure including bleeding, infection, injury to nerves, blood vessels, adjacent structures as well as headache and CSF leak, written and oral informed consent was obtained. Consent was obtained by Durenda Guthrie, PA. Time out form was completed.   Patient was positioned prone on the fluoroscopy table. Local anesthesia was provided with 1% lidocaine without epinephrine after prepped and draped in the usual sterile fashion. Puncture  was performed at L5-S1 using a 3 1/2 inch 22-gauge spinal needle via a left interlaminar approach. Using a single pass through the dura, the needle was placed within the thecal sac, with return of clear CSF. 15 mL of Isovue M-200 was injected into the thecal sac, with normal opacification of the nerve roots and cauda equina consistent with free flow within the subarachnoid space.   Aimee Han, PA performed the lumbar puncture and administered the intrathecal contrast under my direct supervision. I also personally supervised acquisition of the myelogram images.   TECHNIQUE: Contiguous axial images were obtained through the Lumbar spine after the intrathecal infusion of contrast. Coronal and sagittal reconstructions were obtained of the axial image sets.   COMPARISON:  Lumbar spine radiographs 05/11/2021   FINDINGS: LUMBAR MYELOGRAM FINDINGS:   There are 5 non rib-bearing lumbar type vertebrae. Grade 1 retrolisthesis of L2 on L3, L3 on L4, and L4 on L5 does not change with flexion or extension. There are ventral extradural defects at these levels with mild-to-moderate spinal stenosis at L3-4 and mild spinal stenosis at L4-5. There is evidence of asymmetric right lateral recess stenosis at L3-4 partially effacing the right L4 nerve root.   CT LUMBAR MYELOGRAM FINDINGS:   Grade 1 retrolisthesis of L2 on L3, L3 on L4, and L4 on L5 measures 4 mm each. No acute fracture or suspicious osseous lesion is identified. Scattered small Schmorl's nodes are noted. Disc space  narrowing is present throughout the lumbar spine and is moderate to severe at L1-2 and L3-4. The conus medullaris terminates at L1. There is extensive abdominal aortic atherosclerosis without aneurysm. The visualized portion of the liver is hypoattenuating suggesting steatosis.   T12-L1: Moderate left facet arthrosis without stenosis.   L1-2: Disc bulging and endplate spurring without stenosis.   L2-3: Retrolisthesis  with bulging uncovered disc and mild facet and ligamentum flavum hypertrophy result in mild spinal stenosis and mild left lateral recess stenosis without neural foraminal stenosis.   L3-4: Retrolisthesis with bulging uncovered disc, endplate spurring, prominent dorsal epidural fat, and moderate facet and ligamentum flavum hypertrophy result in moderate spinal stenosis, moderate right greater than left lateral recess stenosis, and severe right and mild left neural foraminal stenosis. Potential right L3 and L4 nerve root impingement.   L4-5: Retrolisthesis with bulging uncovered disc, endplate spurring, and moderate facet and ligamentum flavum hypertrophy result in mild left lateral recess stenosis and moderate left greater than right neural foraminal stenosis without significant spinal stenosis.   L5-S1: Disc bulging, endplate spurring, and severe facet and ligamentum flavum hypertrophy result in mild left greater than right neural foraminal stenosis without spinal stenosis.   IMPRESSION: 1. Advanced lumbar disc and facet degeneration, greatest at L3-4 where there is moderate spinal stenosis and severe right neural stenosis. 2. Moderate bilateral neural foraminal stenosis at L4-5. 3. Mild spinal stenosis at L2-3. 4. Aortic Atherosclerosis (ICD10-I70.0).     Electronically Signed   By: Logan Bores M.D.   On: 08/10/2021 15:05   I, [name], personally (independently) visualized and performed the interpretation of the images attached in this note.      Assessment and Plan: 75 y.o. male with right low back pain.  This is a chronic issue.  He had an epidural steroid injection in October which did help some.  However he has more axial back pain and radiculopathy.  Based on his CT myelogram I think we should target his facet joints on his right side.  He does have degenerative changes right lower facet joints.  We will target right L5-S1 and L4-L5.  Patient will keep me updated.   If is not much better with this would consider referral to [facility] for further help managing his lumbar pain with injections.   PDMP not reviewed this encounter. Orders Placed This Encounter  Procedures   DG FACET JT INJ L /S SINGLE LEVEL RIGHT W/FL/CT    Standing Status:   Future    Standing Expiration Date:   10/10/2022    Order Specific Question:   Reason for Exam (SYMPTOM  OR DIAGNOSIS REQUIRED)    Answer:   low back pain    Order Specific Question:   Preferred Imaging Location?    Answer:   [id number]   DG FACET JT INJ C/T 2ND LEVEL RIGHT W/FL/CT    Right L5-S1    Standing Status:   Future    Standing Expiration Date:   10/10/2022    Order Specific Question:   Reason for Exam (SYMPTOM  OR DIAGNOSIS REQUIRED)    Answer:   low back pain    Order Specific Question:   Preferred Imaging Location?    Answer:   [facility]   No orders of the defined types were placed in this encounter.    Discussed warning signs or symptoms. Please see discharge instructions. Patient expresses understanding.   The above documentation has been reviewed and is accurate and complete  [name], M.D.  Total encounter time 30 minutes including face-to-face time with the patient and, reviewing past medical record, and charting on the date of service.   Reviewed CT scan discussed treatment plan and options and next steps.

## 2021-10-12 ENCOUNTER — Other Ambulatory Visit: Payer: Self-pay | Admitting: Internal Medicine

## 2021-10-12 DIAGNOSIS — D485 Neoplasm of uncertain behavior of skin: Secondary | ICD-10-CM | POA: Diagnosis not present

## 2021-10-12 DIAGNOSIS — L570 Actinic keratosis: Secondary | ICD-10-CM | POA: Diagnosis not present

## 2021-10-12 DIAGNOSIS — D040 Carcinoma in situ of skin of lip: Secondary | ICD-10-CM | POA: Diagnosis not present

## 2021-10-13 ENCOUNTER — Encounter (HOSPITAL_BASED_OUTPATIENT_CLINIC_OR_DEPARTMENT_OTHER): Payer: Self-pay | Admitting: Physical Therapy

## 2021-10-13 ENCOUNTER — Ambulatory Visit (HOSPITAL_BASED_OUTPATIENT_CLINIC_OR_DEPARTMENT_OTHER): Payer: PPO | Attending: Family Medicine | Admitting: Physical Therapy

## 2021-10-13 ENCOUNTER — Other Ambulatory Visit: Payer: Self-pay

## 2021-10-13 DIAGNOSIS — M545 Low back pain, unspecified: Secondary | ICD-10-CM | POA: Insufficient documentation

## 2021-10-13 DIAGNOSIS — M6281 Muscle weakness (generalized): Secondary | ICD-10-CM | POA: Insufficient documentation

## 2021-10-13 DIAGNOSIS — R262 Difficulty in walking, not elsewhere classified: Secondary | ICD-10-CM | POA: Insufficient documentation

## 2021-10-13 NOTE — Therapy (Signed)
[place], [place], [address] Phone: [phone]   Fax:  [phone]  Physical Therapy Treatment  Patient Details  Name: [name] MRN: [id number] Date of Birth: 10-04-1946 Referring Provider (PT): [name]   Encounter Date: 10/13/2021   PT End of Session - 10/13/21 1155     Visit Number 11    Number of Visits 12    Date for PT Re-Evaluation 11/22/21    Authorization Type HTA    PT Start Time 1150    PT Stop Time 1230    PT Time Calculation (min) 40 min    Activity Tolerance Patient tolerated treatment well    Behavior During Therapy [facility] for tasks assessed/performed             Past Medical History:  Diagnosis Date   Complete heart block (HCC)    S/P  PPM by Dr Rayann Heman   Coronary artery disease    Non-obstructive. Pt had left heart catheterization in August 2009 showing a 30% mid-circumflex lesion, otherwise coronaries were clean angiographically   Hyperlipidemia    Hypertension    OSA (obstructive sleep apnea)    Probable; study never scheduled    Past Surgical History:  Procedure Laterality Date   [place], 2009   2009- negative   COLONOSCOPY  2011   negative, [facility]   PACEMAKER INSERTION  01/13/11   MDT by Greggory Brandy for complete heart block    There were no vitals filed for this visit.   Subjective Assessment - 10/13/21 1151     Subjective Pt states that the NT is down but has some soreness in the back today. He feels it has improved.    Diagnostic tests Myelogram: IMPRESSION:  1. Advanced lumbar disc and facet degeneration, greatest at L3-4  where there is moderate spinal stenosis and severe right neural  stenosis.  2. Moderate bilateral neural foraminal stenosis at L4-5.  3. Mild spinal stenosis at L2-3.  4. Aortic Atherosclerosis (ICD10-I70.0).    Patient Stated Goals decreased pain , return to golf    Currently in Pain? Yes    Pain Score 1     Pain  Location Back    Pain Orientation Left;Right    Pain Descriptors / Indicators Aching;Sore                                  Lumbar Exercises: Standing    Other Standing Lumbar Exercises   15lb KB offset farmer's carry 79f x2 each     Seated  alt UE/LE marching with abdominal brace 2x10            Fwd flexion stretch 10s 10x Fwd trunk isometric flexion press into blue swissball- seated 5s 10x            Lumbar Exercises: Standing    Other Supine Lumbar Exercises       Bridge  Standing trunk and hip rotation at wall with blue swissball 2x10  Seated cable row 2x10           Manual Therapy    Manual therapy comments bilat L/S paraspinals and R QL, lower T/S parspinals on R           PT Education - 10/13/21 1237     Education Details lifting mechanics/technique, exercise progression, gym exercise safety, HEP update    Person(s) Educated Patient  Methods Explanation;Demonstration    Comprehension Verbalized understanding;Returned demonstration              PT Short Term Goals - 10/04/21 1015       PT SHORT TERM GOAL #1   Title Pt to be independent with initial HEP    Time 2    Status Partially Met      PT SHORT TERM GOAL #2   Title Pt will demonstrate at least a 12.8 improvement in Oswestry Index in order to demonstrate a clinically significant change in LBP and function.    Time 3    Period Weeks    Status On-going               PT Long Term Goals - 10/04/21 1015       PT LONG TERM GOAL #1   Title Pt to be independent with final HEP    Time 6    Period Weeks    Status Partially Met      PT LONG TERM GOAL #2   Title Pt to report ability for standing, walking, for at least 30 min with pain in lumbar spine and R LE decreased , to 0-2/10    Time 6    Period Weeks    Status Partially Met      PT LONG TERM GOAL #3   Title Pt will be able to perform 5XSTS in under 12  in order to demonstrate functional improvement above  the cut off score for older adults.    Time 6    Period Weeks    Status On-going      PT LONG TERM GOAL #4   Title Pt will be able to demonstrate/report ability to sit/stand for extended periods of time without pain in order to demonstrate functional improvement and tolerance to static positioning.    Time 6    Period Weeks    Status Partially Met                   Plan - 10/13/21 1238     Clinical Impression Statement Pt able to progress core strengthening exercise at today's session and improve tolerance to external loading. With pts's report of increased LBP with lifting, it is likely due to functional strength and capacity deficits. Pt has made mild improvements in ROM but is most limited in strength. HEP has been updated at this time to transition to more strength training. Pt would benefit from continued skilled therapy in order to reach goals and maximize functional lumbopelvic and R LE strength for prevention of further functional decline.    Examination-Activity Limitations Lift;Stand;Locomotion Level;Carry;Squat;Bend    Examination-Participation Restrictions Cleaning;Meal Prep;Yard Work;Community Activity    Stability/Clinical Decision Making Stable/Uncomplicated    Rehab Potential Good    PT Frequency 2x / week    PT Duration 6 weeks    PT Treatment/Interventions ADLs/Self Care Home Management;Canalith Repostioning;Cryotherapy;DME Instruction;Traction;Moist Heat;Gait training;Stair training;Functional mobility training;Therapeutic activities;Therapeutic exercise;Balance training;Patient/family education;Neuromuscular re-education;Manual techniques;Taping;Dry needling;Passive range of motion;Spinal Manipulations;Joint Manipulations;Aquatic Therapy;Electrical Stimulation    PT Next Visit Plan review HEP    PT Home Exercise Plan [facility]    Consulted and Agree with Plan of Care Patient             Patient will benefit from skilled therapeutic intervention in order to  improve the following deficits and impairments:  Pain, Improper body mechanics, Decreased mobility, Hypomobility, Decreased strength, Decreased range of motion, Decreased activity tolerance, Impaired flexibility  Visit  Diagnosis: Pain, lumbar region  Difficulty walking  Muscle weakness (generalized)     Problem List Patient Active Problem List   Diagnosis Date Noted   Chronic right SI joint pain 41/01/130   Umbilical hernia without obstruction or gangrene 02/02/2021   Hepatic steatosis 08/05/2020   Aortic atherosclerosis ([place]) 08/04/2020   Complex regional pain syndrome type I of the upper limb 07/26/2020   Flexion contracture of joint of hand 07/26/2020   Pacemaker 07/26/2020   Rib pain on left side 06/09/2020   Decreased pulses in feet 06/09/2020   Erectile dysfunction 03/03/2017   Chronic headaches 02/13/2017   Snoring 02/15/2015   Elevated liver enzymes 09/13/2014   Dupuytren's contracture of both hands 09/02/2013   Stiffness of right hand joint 05/07/2013   Skin tear of hand without complication 43/88/8757   History of gout 12/27/2011   Essential hypertension 01/12/2011   Complete heart block ([place]) 01/12/2011   Hyperglycemia 03/02/2009   HYPERPLASIA PROSTATE UNS W/O UR OBST & OTH LUTS 08/13/2008   Hyperlipidemia 02/17/2008   PAROXYSMAL SUPRAVENTRICULAR TACHYCARDIA 02/17/2008    Daleen Bo, PT 10/13/2021, 12:45 PM  [place], [place], [address] Phone: [phone]   Fax:  [phone]  Name: [name] MRN: [id number] Date of Birth: 12-22-1945

## 2021-10-17 ENCOUNTER — Ambulatory Visit
Admission: RE | Admit: 2021-10-17 | Discharge: 2021-10-17 | Disposition: A | Discharge status: Home / Self Care | Payer: PPO | Source: Ambulatory Visit | Attending: Family Medicine | Admitting: Family Medicine

## 2021-10-17 ENCOUNTER — Other Ambulatory Visit: Payer: Self-pay

## 2021-10-17 DIAGNOSIS — M47817 Spondylosis without myelopathy or radiculopathy, lumbosacral region: Secondary | ICD-10-CM | POA: Diagnosis not present

## 2021-10-17 DIAGNOSIS — G8929 Other chronic pain: Secondary | ICD-10-CM

## 2021-10-17 DIAGNOSIS — M5441 Lumbago with sciatica, right side: Secondary | ICD-10-CM

## 2021-10-17 MED ORDER — METHYLPREDNISOLONE ACETATE 40 MG/ML INJ SUSP (RADIOLOG
80.0000 mg | Freq: Once | INTRAMUSCULAR | Status: AC
  Administered 2021-10-17: 80 mg via INTRA_ARTICULAR

## 2021-10-17 MED ORDER — IOPAMIDOL (ISOVUE-M 200) INJECTION 41%
1.0000 mL | Freq: Once | INTRAMUSCULAR | Status: AC
  Administered 2021-10-17: 1 mL via INTRA_ARTICULAR

## 2021-10-17 NOTE — Discharge Instructions (Signed)

## 2021-10-19 ENCOUNTER — Ambulatory Visit (HOSPITAL_BASED_OUTPATIENT_CLINIC_OR_DEPARTMENT_OTHER): Payer: PPO | Admitting: Physical Therapy

## 2021-10-19 ENCOUNTER — Other Ambulatory Visit: Payer: Self-pay

## 2021-10-19 ENCOUNTER — Encounter (HOSPITAL_BASED_OUTPATIENT_CLINIC_OR_DEPARTMENT_OTHER): Payer: Self-pay | Admitting: Physical Therapy

## 2021-10-19 DIAGNOSIS — R262 Difficulty in walking, not elsewhere classified: Secondary | ICD-10-CM

## 2021-10-19 DIAGNOSIS — M545 Low back pain, unspecified: Secondary | ICD-10-CM

## 2021-10-19 DIAGNOSIS — M6281 Muscle weakness (generalized): Secondary | ICD-10-CM

## 2021-10-19 NOTE — Therapy (Signed)
[place], [place], [address] Phone: [phone]   Fax:  [phone]  Physical Therapy Discharge  Patient Details  Name: [name] MRN: [id number] Date of Birth: 09/22/1946 Referring Provider (PT): [name]   Encounter Date: 10/19/2021   PT End of Session - 10/19/21 1055     Visit Number 12    Number of Visits 12    Date for PT Re-Evaluation 11/22/21    Authorization Type HTA    PT Start Time 1100    PT Stop Time 1140    PT Time Calculation (min) 40 min    Activity Tolerance Patient tolerated treatment well    Behavior During Therapy [facility] for tasks assessed/performed             Past Medical History:  Diagnosis Date   Complete heart block (HCC)    S/P  PPM by Dr Rayann Heman   Coronary artery disease    Non-obstructive. Pt had left heart catheterization in August 2009 showing a 30% mid-circumflex lesion, otherwise coronaries were clean angiographically   Hyperlipidemia    Hypertension    OSA (obstructive sleep apnea)    Probable; study never scheduled    Past Surgical History:  Procedure Laterality Date   [place], 2009   2009- negative   COLONOSCOPY  2011   negative, [place] [facility]   PACEMAKER INSERTION  01/13/11   MDT by Greggory Brandy for complete heart block    There were no vitals filed for this visit.   Subjective Assessment - 10/19/21 1056     Subjective Pt states he got an injection on Monday and it has helped  a lot. He has very little pain now.    Diagnostic tests Myelogram: IMPRESSION:  1. Advanced lumbar disc and facet degeneration, greatest at L3-4  where there is moderate spinal stenosis and severe right neural  stenosis.  2. Moderate bilateral neural foraminal stenosis at L4-5.  3. Mild spinal stenosis at L2-3.  4. Aortic Atherosclerosis (ICD10-I70.0).    Patient Stated Goals decreased pain , return to golf    Currently in Pain? No/denies    Pain Score 1                  [facility] PT Assessment - 10/19/21 0001       Assessment   Medical Diagnosis Low Back Pain    Referring Provider (PT) [name]    Prior Therapy continued from previous episode      Precautions   Precautions ICD/Pacemaker    Precaution Comments pacemaker      Restrictions   Weight Bearing Restrictions No      [place] residence      Prior Function   Level of Independence Independent      Cognition   Overall Cognitive Status Within Functional Limits for tasks assessed        Functional Tests   Functional tests Sit to Stand   5XSTS 14..2s     Posture/Postural Control   Posture/Postural Control Postural limitations    Postural Limitations Rounded Shoulders;Increased thoracic kyphosis;Decreased lumbar lordosis;Flexed trunk      AROM   Overall AROM Comments Lumbar: flexion 70%, ext 10%, L rot 70%, R rot 50% ,L SB 50%, R SB 50%; able to cross and uncross legs in fig 4      Strength   Overall Strength Comments Hips throughout: 4+/5, Knee: 5/5,  Palpation   Palpation comment TTP in R low lumbar region, into R SI      Ambulation/Gait   Ambulation/Gait Yes    Ambulation Distance (Feet) 55 Feet    Assistive device None    Gait Pattern Decreased stride length;Decreased trunk rotation;Trunk flexed;Decreased dorsiflexion - left;Decreased dorsiflexion - right                 Lumbar Exercises: Standing     Other Standing Lumbar Exercises   15lb KB offset farmer's carry  (reviewed and demo'd)     Seated              Fwd flexion stretch 3s 10x 3 way Fwd trunk isometric flexion press into blue swissball- seated 5s 10x             Lumbar Exercises: Standing    Other Supine Lumbar Exercises       Leg press demo Seated knee extension demo KD DL demo 10lb KB  Seated cable row 2x10           Manual Therapy    Manual therapy comments bilat L/S paraspinals and R QL, lower T/S parspinals on R                         PT Short Term Goals - 10/19/21 1141       PT SHORT TERM GOAL #1   Title Pt to be independent with initial HEP    Time 2    Status Achieved      PT SHORT TERM GOAL #2   Title Pt will demonstrate at least a 12.8 improvement in Oswestry Index in order to demonstrate a clinically significant change in LBP and function.    Time 3    Period Weeks    Status Partially Met               PT Long Term Goals - 10/19/21 1141       PT LONG TERM GOAL #1   Title Pt to be independent with final HEP    Time 6    Period Weeks    Status Achieved      PT LONG TERM GOAL #2   Title Pt to report ability for standing, walking, for at least 30 min with pain in lumbar spine and R LE decreased , to 0-2/10    Time 6    Period Weeks    Status Partially Met      PT LONG TERM GOAL #3   Title Pt will be able to perform 5XSTS in under 12  in order to demonstrate functional improvement above the cut off score for older adults.    Time 6    Period Weeks    Status Partially Met      PT LONG TERM GOAL #4   Title Pt will be able to demonstrate/report ability to sit/stand for extended periods of time without pain in order to demonstrate functional improvement and tolerance to static positioning.    Time 6    Period Weeks    Status Achieved                   Plan - 10/19/21 1056     Clinical Impression Statement Pt with significantly improved ROM and pain following recent facet joint injections. Due to recent injections, no signficant loading exercise done today. However gym/machine based exercises were demosntrated to pt. Pt to perform gym based  exercise while out of town after review with PT. . D/C this current plan of care due to pt being out of town.    Examination-Activity Limitations Lift;Stand;Locomotion Level;Carry;Squat;Bend    Examination-Participation Restrictions Cleaning;Meal Prep;Yard Work;Community Activity    Stability/Clinical  Decision Making Stable/Uncomplicated    Rehab Potential Good    PT Frequency 2x / week    PT Duration 6 weeks    PT Treatment/Interventions ADLs/Self Care Home Management;Canalith Repostioning;Cryotherapy;DME Instruction;Traction;Moist Heat;Gait training;Stair training;Functional mobility training;Therapeutic activities;Therapeutic exercise;Balance training;Patient/family education;Neuromuscular re-education;Manual techniques;Taping;Dry needling;Passive range of motion;Spinal Manipulations;Joint Manipulations;Aquatic Therapy;Electrical Stimulation    PT Next Visit Plan review HEP    PT Home Exercise Plan [facility]    Consulted and Agree with Plan of Care Patient             Patient will benefit from skilled therapeutic intervention in order to improve the following deficits and impairments:  Pain, Improper body mechanics, Decreased mobility, Hypomobility, Decreased strength, Decreased range of motion, Decreased activity tolerance, Impaired flexibility  Visit Diagnosis: Pain, lumbar region  Difficulty walking  Muscle weakness (generalized)     Problem List Patient Active Problem List   Diagnosis Date Noted   Chronic right SI joint pain 02/33/4356   Umbilical hernia without obstruction or gangrene 02/02/2021   Hepatic steatosis 08/05/2020   Aortic atherosclerosis ([place]) 08/04/2020   Complex regional pain syndrome type I of the upper limb 07/26/2020   Flexion contracture of joint of hand 07/26/2020   Pacemaker 07/26/2020   Rib pain on left side 06/09/2020   Decreased pulses in feet 06/09/2020   Erectile dysfunction 03/03/2017   Chronic headaches 02/13/2017   Snoring 02/15/2015   Elevated liver enzymes 09/13/2014   Dupuytren's contracture of both hands 09/02/2013   Stiffness of right hand joint 05/07/2013   Skin tear of hand without complication 86/16/8372   History of gout 12/27/2011   Essential hypertension 01/12/2011   Complete heart block ([place]) 01/12/2011    Hyperglycemia 03/02/2009   HYPERPLASIA PROSTATE UNS W/O UR OBST & OTH LUTS 08/13/2008   Hyperlipidemia 02/17/2008   PAROXYSMAL SUPRAVENTRICULAR TACHYCARDIA 02/17/2008    Daleen Bo, PT 10/19/2021, 11:42 AM  [place] [address] [place], [place], [address] Phone: [phone]   Fax:  [phone]  Name: KUSH FARABEE MRN: [id number] Date of Birth: October 05, 1946  PHYSICAL THERAPY DISCHARGE SUMMARY  Visits from Start of Care: 12   Plan: Patient agrees to discharge.  Patient goals were partially met. Patient is being discharged due to travelling out of town for >30 days.

## 2021-10-21 ENCOUNTER — Encounter (HOSPITAL_BASED_OUTPATIENT_CLINIC_OR_DEPARTMENT_OTHER): Payer: PPO | Admitting: Physical Therapy

## 2021-10-21 ENCOUNTER — Encounter (HOSPITAL_BASED_OUTPATIENT_CLINIC_OR_DEPARTMENT_OTHER): Payer: Self-pay

## 2021-10-26 ENCOUNTER — Encounter (HOSPITAL_BASED_OUTPATIENT_CLINIC_OR_DEPARTMENT_OTHER): Payer: PPO | Admitting: Physical Therapy

## 2021-10-28 ENCOUNTER — Encounter (HOSPITAL_BASED_OUTPATIENT_CLINIC_OR_DEPARTMENT_OTHER): Payer: PPO | Admitting: Physical Therapy

## 2021-11-01 ENCOUNTER — Encounter (HOSPITAL_BASED_OUTPATIENT_CLINIC_OR_DEPARTMENT_OTHER): Payer: PPO | Admitting: Physical Therapy

## 2021-11-03 ENCOUNTER — Encounter (HOSPITAL_BASED_OUTPATIENT_CLINIC_OR_DEPARTMENT_OTHER): Payer: PPO | Admitting: Physical Therapy

## 2021-11-08 ENCOUNTER — Encounter (HOSPITAL_BASED_OUTPATIENT_CLINIC_OR_DEPARTMENT_OTHER): Payer: PPO | Admitting: Physical Therapy

## 2021-11-11 ENCOUNTER — Encounter (HOSPITAL_BASED_OUTPATIENT_CLINIC_OR_DEPARTMENT_OTHER): Payer: PPO | Admitting: Physical Therapy

## 2021-11-19 DIAGNOSIS — M86172 Other acute osteomyelitis, left ankle and foot: Secondary | ICD-10-CM | POA: Diagnosis not present

## 2021-11-19 DIAGNOSIS — M25572 Pain in left ankle and joints of left foot: Secondary | ICD-10-CM | POA: Diagnosis not present

## 2021-11-20 NOTE — Progress Notes (Deleted)
Subjective:    Patient ID: [name], male    DOB: [birth year], 77 y.o.   MRN: [id number]  This visit occurred during the SARS-CoV-2 public health emergency.  Safety protocols were in place, including screening questions prior to the visit, additional usage of staff PPE, and extensive cleaning of exam room while observing appropriate contact time as indicated for disinfecting solutions.    HPI The patient is here for an acute visit.   [place] abscess left ankle - he was seen at urgent care and prescribed doxycycline.      Medications and allergies reviewed with patient and updated if appropriate.  Patient Active Problem List   Diagnosis Date Noted   Chronic right SI joint pain 00/86/7619   Umbilical hernia without obstruction or gangrene 02/02/2021   Hepatic steatosis 08/05/2020   Aortic atherosclerosis ([place]) 08/04/2020   Complex regional pain syndrome type I of the upper limb 07/26/2020   Flexion contracture of joint of hand 07/26/2020   Pacemaker 07/26/2020   Rib pain on left side 06/09/2020   Decreased pulses in feet 06/09/2020   Erectile dysfunction 03/03/2017   Chronic headaches 02/13/2017   Snoring 02/15/2015   Elevated liver enzymes 09/13/2014   Dupuytren's contracture of both hands 09/02/2013   Stiffness of right hand joint 05/07/2013   Skin tear of hand without complication 50/93/2671   History of gout 12/27/2011   Essential hypertension 01/12/2011   Complete heart block ([place]) 01/12/2011   Hyperglycemia 03/02/2009   HYPERPLASIA PROSTATE UNS W/O UR OBST & OTH LUTS 08/13/2008   Hyperlipidemia 02/17/2008   PAROXYSMAL SUPRAVENTRICULAR TACHYCARDIA 02/17/2008    Current Outpatient Medications on File Prior to Visit  Medication Sig Dispense Refill   allopurinol (ZYLOPRIM) 300 MG tablet TAKE 0.5 TABLETS BY MOUTH DAILY. 45 tablet 1   clobetasol (TEMOVATE) 0.05 % cream Apply 1 application topically daily as needed (itching).     COVID-19 mRNA bivalent vaccine,  Pfizer, (PFIZER COVID-19 VAC BIVALENT) injection Inject into the muscle. 0.3 mL 0   fish oil-omega-3 fatty acids 1000 MG capsule Take 1 g by mouth 2 (two) times daily.     fluorouracil (EFUDEX) 5 % cream Apply 1 application topically daily.     gabapentin (NEURONTIN) 300 MG capsule TAKE 1 CAPSULE BY MOUTH EVERYDAY AT BEDTIME 90 capsule 1   ibuprofen (ADVIL,MOTRIN) 200 MG tablet Take 200 mg by mouth daily.     losartan (COZAAR) 100 MG tablet TAKE 1 TABLET BY MOUTH EVERY DAY 90 tablet 1   mometasone (ELOCON) 0.1 % lotion SMARTSIG:1 Topical Daily PRN     sildenafil (REVATIO) 20 MG tablet TAKE 3 TO 5 TABLETS BY MOUTH AS NEEDED 100 tablet 0   simvastatin (ZOCOR) 40 MG tablet TAKE 1 TABLET BY MOUTH DAILY AT 6 PM. 90 tablet 1   No current facility-administered medications on file prior to visit.    Past Medical History:  Diagnosis Date   Complete heart block (HCC)    S/P  PPM by Dr Rayann Heman   Coronary artery disease    Non-obstructive. Pt had left heart catheterization in August 2009 showing a 30% mid-circumflex lesion, otherwise coronaries were clean angiographically   Hyperlipidemia    Hypertension    OSA (obstructive sleep apnea)    Probable; study never scheduled    Past Surgical History:  Procedure Laterality Date   [place], 2009   2009- negative   COLONOSCOPY  2011   negative, [facility]   PACEMAKER INSERTION  01/13/11   MDT by Greggory Brandy for complete heart block    Social History   Socioeconomic History   Marital status: Married    Spouse name: Not on file   Number of children: Not on file   Years of education: Not on file   Highest education level: Not on file  Occupational History   Not on file  Tobacco Use   Smoking status: Former    Types: Cigarettes    Quit date: 11/13/2005    Years since quitting: 16.0   Smokeless tobacco: Never   Tobacco comments:    smoked 1965- 2007, up to 2 ppd  Vaping Use   Vaping Use: Never used  Substance and Sexual Activity    Alcohol use: Yes    Comment:  21 drinks/ week   Drug use: No   Sexual activity: Not on file  Other Topics Concern   Not on file  Social History Narrative   Exercise: regularly - golf and walks - does both a few times a week   Social Determinants of Health   Financial Resource Strain: Not on file  Food Insecurity: Not on file  Transportation Needs: Not on file  Physical Activity: Not on file  Stress: Not on file  Social Connections: Not on file    Family History  Problem Relation Age of Onset   Heart attack Maternal Grandfather 42   Diabetes Father    Peripheral vascular disease Mother    Cancer Mother        ? primary   Arrhythmia Mother        AFIB   Gout Brother    Leukemia Brother    Other Sister        prediabetic   Stroke Neg Hx     Review of Systems     Objective:  There were no vitals filed for this visit. BP Readings from Last 3 Encounters:  10/17/21 (!) 149/84  10/10/21 118/80  09/06/21 126/80   Wt Readings from Last 3 Encounters:  10/10/21 233 lb (105.7 kg)  09/06/21 234 lb (106.1 kg)  08/17/21 240 lb (108.9 kg)   There is no height or weight on file to calculate BMI.   Physical Exam         Assessment & Plan:    See Problem List for Assessment and Plan of chronic medical problems.

## 2021-11-21 ENCOUNTER — Telehealth: Payer: Self-pay | Admitting: Internal Medicine

## 2021-11-21 NOTE — Telephone Encounter (Signed)
Moved patient up for tomorrow at 2:00 pm

## 2021-11-21 NOTE — Progress Notes (Signed)
Subjective:    Patient ID: [name], male    DOB: 1946-03-06, 76 y.o.   MRN: [id number]  This visit occurred during the SARS-CoV-2 public health emergency.  Safety protocols were in place, including screening questions prior to the visit, additional usage of staff PPE, and extensive cleaning of exam room while observing appropriate contact time as indicated for disinfecting solutions.    HPI The patient is here for an acute visit.  Went to ED - [facility] and diagnosed with Brodie abscess left ankle - started on doxycycline.  Today is day 3 of antibiotics.     One day last week he noticed a stinging or burning sensation near the lateral left ankle and noticed a white spot there.  The next day there was some redness near it going up the leg.  He went to the ED last Saturday and had an xray.  He was found to have a brodie abscess.  He was started on doxycyline and the redness has improved.    No injury to left ankle.  No fevers or chills.  Medications and allergies reviewed with patient and updated if appropriate.  Patient Active Problem List   Diagnosis Date Noted   Brodie's abscess of left fibula ([place]) 11/22/2021   Chronic right SI joint pain 32/95/1884   Umbilical hernia without obstruction or gangrene 02/02/2021   Hepatic steatosis 08/05/2020   Aortic atherosclerosis ([place]) 08/04/2020   Complex regional pain syndrome type I of the upper limb 07/26/2020   Flexion contracture of joint of hand 07/26/2020   Pacemaker 07/26/2020   Rib pain on left side 06/09/2020   Decreased pulses in feet 06/09/2020   Erectile dysfunction 03/03/2017   Chronic headaches 02/13/2017   Snoring 02/15/2015   Elevated liver enzymes 09/13/2014   Dupuytren's contracture of both hands 09/02/2013   Stiffness of right hand joint 05/07/2013   Skin tear of hand without complication 16/60/6301   History of gout 12/27/2011   Essential hypertension 01/12/2011   Complete heart block ([place]) 01/12/2011    Hyperglycemia 03/02/2009   HYPERPLASIA PROSTATE UNS W/O UR OBST & OTH LUTS 08/13/2008   Hyperlipidemia 02/17/2008   PAROXYSMAL SUPRAVENTRICULAR TACHYCARDIA 02/17/2008    Current Outpatient Medications on File Prior to Visit  Medication Sig Dispense Refill   allopurinol (ZYLOPRIM) 300 MG tablet TAKE 0.5 TABLETS BY MOUTH DAILY. 45 tablet 1   clobetasol (TEMOVATE) 0.05 % cream Apply 1 application topically daily as needed (itching).     COVID-19 mRNA bivalent vaccine, Pfizer, (PFIZER COVID-19 VAC BIVALENT) injection Inject into the muscle. 0.3 mL 0   doxycycline (VIBRA-TABS) 100 MG tablet Take 100 mg by mouth 2 (two) times daily.     fish oil-omega-3 fatty acids 1000 MG capsule Take 1 g by mouth 2 (two) times daily.     fluorouracil (EFUDEX) 5 % cream Apply 1 application topically daily.     ibuprofen (ADVIL,MOTRIN) 200 MG tablet Take 200 mg by mouth daily.     mometasone (ELOCON) 0.1 % lotion SMARTSIG:1 Topical Daily PRN     sildenafil (REVATIO) 20 MG tablet TAKE 3 TO 5 TABLETS BY MOUTH AS NEEDED 100 tablet 0   simvastatin (ZOCOR) 40 MG tablet TAKE 1 TABLET BY MOUTH DAILY AT 6 PM. 90 tablet 1   No current facility-administered medications on file prior to visit.    Past Medical History:  Diagnosis Date   Complete heart block (HCC)    S/P  PPM by Dr Rayann Heman  Coronary artery disease    Non-obstructive. Pt had left heart catheterization in August 2009 showing a 30% mid-circumflex lesion, otherwise coronaries were clean angiographically   Hyperlipidemia    Hypertension    OSA (obstructive sleep apnea)    Probable; study never scheduled    Past Surgical History:  Procedure Laterality Date   [place], 2009   2009- negative   COLONOSCOPY  2011   negative, [facility]   PACEMAKER INSERTION  01/13/11   MDT by Greggory Brandy for complete heart block    Social History   Socioeconomic History   Marital status: Married    Spouse name: Not on file   Number of children: Not  on file   Years of education: Not on file   Highest education level: Not on file  Occupational History   Not on file  Tobacco Use   Smoking status: Former    Types: Cigarettes    Quit date: 11/13/2005    Years since quitting: 16.0   Smokeless tobacco: Never   Tobacco comments:    smoked 1965- 2007, up to 2 ppd  Vaping Use   Vaping Use: Never used  Substance and Sexual Activity   Alcohol use: Yes    Comment:  21 drinks/ week   Drug use: No   Sexual activity: Not on file  Other Topics Concern   Not on file  Social History Narrative   Exercise: regularly - golf and walks - does both a few times a week   Social Determinants of Health   Financial Resource Strain: Not on file  Food Insecurity: Not on file  Transportation Needs: Not on file  Physical Activity: Not on file  Stress: Not on file  Social Connections: Not on file    Family History  Problem Relation Age of Onset   Heart attack Maternal Grandfather 25   Diabetes Father    Peripheral vascular disease Mother    Cancer Mother        ? primary   Arrhythmia Mother        AFIB   Gout Brother    Leukemia Brother    Other Sister        prediabetic   Stroke Neg Hx     Review of Systems  Constitutional:  Negative for chills and fever.  Skin:  Negative for rash and wound.  Neurological:  Negative for dizziness and light-headedness.      Objective:   Vitals:   11/22/21 1412  BP: 102/64  Pulse: 74  Temp: 98.4 F (36.9 C)  SpO2: 97%   BP Readings from Last 3 Encounters:  11/22/21 102/64  10/17/21 (!) 149/84  10/10/21 118/80   Wt Readings from Last 3 Encounters:  11/22/21 228 lb (103.4 kg)  10/10/21 233 lb (105.7 kg)  09/06/21 234 lb (106.1 kg)   Body mass index is 27.04 kg/m.   Physical Exam Constitutional:      General: He is not in acute distress.    Appearance: Normal appearance. He is not ill-appearing.  HENT:     Head: Normocephalic and atraumatic.  Musculoskeletal:     Comments: Left  lateral malleolus w/o erythema or wound/lesion.  Focal area of tenderness posterior mid malleolus  Skin:    General: Skin is warm and dry.     Findings: No bruising, erythema, lesion or rash.  Neurological:     Mental Status: He is alert.           Assessment &  Plan:    See Problem List for Assessment and Plan of chronic medical problems.

## 2021-11-21 NOTE — Telephone Encounter (Signed)
Connected to [facility] 1.7.2023.   Caller reports that her husband is having left ankle has redness and swelling with a red streak up the back of the leg. Advised to see HCP within 4 hours. Appointment was scheduled, next available.

## 2021-11-21 NOTE — Telephone Encounter (Signed)
I think he was seen recently in the emergency room somewhere and was diagnosed with an infection I believe.  If his symptoms are getting worse you should be seen sooner.

## 2021-11-22 ENCOUNTER — Ambulatory Visit (INDEPENDENT_AMBULATORY_CARE_PROVIDER_SITE_OTHER): Payer: PPO | Admitting: Internal Medicine

## 2021-11-22 ENCOUNTER — Other Ambulatory Visit: Payer: Self-pay

## 2021-11-22 ENCOUNTER — Encounter: Payer: Self-pay | Admitting: Internal Medicine

## 2021-11-22 DIAGNOSIS — M868X6 Other osteomyelitis, lower leg: Secondary | ICD-10-CM | POA: Insufficient documentation

## 2021-11-22 DIAGNOSIS — I10 Essential (primary) hypertension: Secondary | ICD-10-CM | POA: Diagnosis not present

## 2021-11-22 MED ORDER — LOSARTAN POTASSIUM 100 MG PO TABS: 100.0000 mg | ORAL_TABLET | Freq: Every day | ORAL | 1 refills | Status: DC

## 2021-11-22 MED ORDER — GABAPENTIN 300 MG PO CAPS: ORAL_CAPSULE | ORAL | 1 refills | Status: DC

## 2021-11-22 NOTE — Patient Instructions (Addendum)
° ° ° °  Medications changes include :   none - continue the doxycyline.       A referral was ordered for Dr Sharol Given - orthopedics.       Someone from their office will call you to schedule an appointment.

## 2021-11-22 NOTE — Assessment & Plan Note (Signed)
Acute Noticed last week burning or stinging sensation left ankle, had a white mark there and it was tender.  Started having erythema that was tracking up leg Went to ED - xray showed brodie abscess - left fibula Started on doxycycline - today is day 3 - continue Will refer to Dr Sharol Given for further evaluation / treatment

## 2021-11-22 NOTE — Assessment & Plan Note (Signed)
Chronic Blood pressure initially on the low side, but repeat was better He has lost a little bit of weight Advised to monitor his BP at home No change in medications Continue losartan 100 mg daily

## 2021-11-23 ENCOUNTER — Ambulatory Visit: Payer: PPO | Admitting: Orthopedic Surgery

## 2021-11-23 ENCOUNTER — Ambulatory Visit (INDEPENDENT_AMBULATORY_CARE_PROVIDER_SITE_OTHER): Payer: PPO

## 2021-11-23 ENCOUNTER — Ambulatory Visit: Payer: PPO | Admitting: Internal Medicine

## 2021-11-23 ENCOUNTER — Encounter: Payer: Self-pay | Admitting: Family

## 2021-11-23 DIAGNOSIS — M1A0720 Idiopathic chronic gout, left ankle and foot, without tophus (tophi): Secondary | ICD-10-CM | POA: Diagnosis not present

## 2021-11-23 DIAGNOSIS — M25572 Pain in left ankle and joints of left foot: Secondary | ICD-10-CM

## 2021-11-23 LAB — URIC ACID: Uric Acid, Serum: 5.6 mg/dL (ref 4.0–8.0)

## 2021-11-23 NOTE — Addendum Note (Signed)
Addended by: Pamella Pert on: 11/23/2021 04:37 PM   Modules accepted: Orders

## 2021-11-23 NOTE — Progress Notes (Signed)
Office Visit Note   Patient: [name]           Date of Birth: [birth year]           MRN: [id number] Visit Date: 11/23/2021              Requested by: Binnie Rail, MD [place],  [place] [address] PCP: Binnie Rail, MD  Chief Complaint  Patient presents with   Left Leg - Pain      HPI: Patient is a 76 year old gentleman who presents with acute pain and swelling and redness over the lateral malleolus left ankle he was seen at [facility] emergency room was started on doxycycline.  Patient has no history of ankle trauma or injury.  Patient has a history of gout he is taking a half of a allopurinol daily.  Patient states that his last uric acid check was around 5.  Patient states that he was eating a lot of seafood while at [facility].  Assessment & Plan: Visit Diagnoses:  1. Pain in left ankle and joints of left foot     Plan: We will draw his uric acid level today and adjust his allopurinol depending on the results.  His radiographs are not impressive for a m. may need to consider a MRI scan.  Follow-Up Instructions: No follow-ups on file.   Ortho Exam  Patient is alert, oriented, no adenopathy, well-dressed, normal affect, normal respiratory effort. On examination patient has brawny skin color changes to the leg with pitting edema no venous ulcers.  He has a palpable dorsalis pedis pulse.  The area of concern was over the lateral malleolus this is nontender to palpation there is no redness no cellulitis no ulcers.  The peroneal tendons are not tender to palpation.  There there is a suggestion of a tophaceous deposit with a very small white raised area.  Imaging: No results found. No images are attached to the encounter.  Labs: Lab Results  Component Value Date   HGBA1C 5.4 08/04/2021   HGBA1C 5.2 08/05/2020   HGBA1C 5.2 02/03/2020   ESRSEDRATE 9 01/20/2016   LABURIC 5.2 02/02/2021   LABURIC 4.5 08/05/2019   LABURIC 4.7 02/13/2018    LABORGA Insignificant Growth 06/09/2011     Lab Results  Component Value Date   ALBUMIN 4.6 08/04/2021   ALBUMIN 4.7 02/02/2021   ALBUMIN 4.8 02/03/2020    Lab Results  Component Value Date   MG 1.9 07/14/2015   No results found for: VD25OH  No results found for: PREALBUMIN CBC EXTENDED Latest Ref Rng & Units 08/04/2021 02/02/2021 08/05/2019  WBC 4.0 - 10.5 K/uL 4.5 5.6 5.3  RBC 4.22 - 5.81 Mil/uL 4.19(L) 4.39 4.21(L)  HGB 13.0 - 17.0 g/dL 14.8 15.4 15.2  HCT 39.0 - 52.0 % 43.8 45.0 44.6  PLT 150.0 - 400.0 K/uL 143.0(L) 150.0 165.0  NEUTROABS 1.4 - 7.7 K/uL 1.9 2.4 2.3  LYMPHSABS 0.7 - 4.0 K/uL 1.9 2.4 2.3     There is no height or weight on file to calculate BMI.  Orders:  Orders Placed This Encounter  Procedures   XR Tibia/Fibula Left   No orders of the defined types were placed in this encounter.    Procedures: No procedures performed  Clinical Data: No additional findings.  ROS:  All other systems negative, except as noted in the HPI. Review of Systems  Objective: Vital Signs: There were no vitals taken for this visit.  Specialty  Comments:  No specialty comments available.  PMFS History: Patient Active Problem List   Diagnosis Date Noted   Brodie's abscess of left fibula ([place]) 11/22/2021   Chronic right SI joint pain 59/56/3875   Umbilical hernia without obstruction or gangrene 02/02/2021   Hepatic steatosis 08/05/2020   Aortic atherosclerosis ([place]) 08/04/2020   Complex regional pain syndrome type I of the upper limb 07/26/2020   Flexion contracture of joint of hand 07/26/2020   Pacemaker 07/26/2020   Rib pain on left side 06/09/2020   Decreased pulses in feet 06/09/2020   Erectile dysfunction 03/03/2017   Chronic headaches 02/13/2017   Snoring 02/15/2015   Elevated liver enzymes 09/13/2014   Dupuytren's contracture of both hands 09/02/2013   Stiffness of right hand joint 05/07/2013   Skin tear of hand without complication 64/33/2951    History of gout 12/27/2011   Essential hypertension 01/12/2011   Complete heart block ([place]) 01/12/2011   Hyperglycemia 03/02/2009   HYPERPLASIA PROSTATE UNS W/O UR OBST & OTH LUTS 08/13/2008   Hyperlipidemia 02/17/2008   PAROXYSMAL SUPRAVENTRICULAR TACHYCARDIA 02/17/2008   Past Medical History:  Diagnosis Date   Complete heart block ([place])    S/P  PPM by Dr Rayann Heman   Coronary artery disease    Non-obstructive. Pt had left heart catheterization in August 2009 showing a 30% mid-circumflex lesion, otherwise coronaries were clean angiographically   Hyperlipidemia    Hypertension    OSA (obstructive sleep apnea)    Probable; study never scheduled    Family History  Problem Relation Age of Onset   Heart attack Maternal Grandfather 68   Diabetes Father    Peripheral vascular disease Mother    Cancer Mother        ? primary   Arrhythmia Mother        AFIB   Gout Brother    Leukemia Brother    Other Sister        prediabetic   Stroke Neg Hx     Past Surgical History:  Procedure Laterality Date   [place], 2009   2009- negative   COLONOSCOPY  2011   negative, [facility]   PACEMAKER INSERTION  01/13/11   MDT by Greggory Brandy for complete heart block   Social History   Occupational History   Not on file  Tobacco Use   Smoking status: Former    Types: Cigarettes    Quit date: 11/13/2005    Years since quitting: 16.0   Smokeless tobacco: Never   Tobacco comments:    smoked 1965- 2007, up to 2 ppd  Vaping Use   Vaping Use: Never used  Substance and Sexual Activity   Alcohol use: Yes    Comment:  21 drinks/ week   Drug use: No   Sexual activity: Not on file

## 2021-12-15 DIAGNOSIS — D0001 Carcinoma in situ of labial mucosa and vermilion border: Secondary | ICD-10-CM | POA: Diagnosis not present

## 2021-12-28 ENCOUNTER — Ambulatory Visit (INDEPENDENT_AMBULATORY_CARE_PROVIDER_SITE_OTHER): Payer: PPO

## 2021-12-28 ENCOUNTER — Encounter (HOSPITAL_BASED_OUTPATIENT_CLINIC_OR_DEPARTMENT_OTHER): Payer: Self-pay | Admitting: Internal Medicine

## 2021-12-28 DIAGNOSIS — I442 Atrioventricular block, complete: Secondary | ICD-10-CM | POA: Diagnosis not present

## 2021-12-28 LAB — CUP PACEART REMOTE DEVICE CHECK
Battery Impedance: 2210 Ohm
Battery Remaining Longevity: 30 mo
Battery Voltage: 2.74 V
Brady Statistic AP VP Percent: 4 %
Brady Statistic AP VS Percent: 0 %
Brady Statistic AS VP Percent: 96 %
Brady Statistic AS VS Percent: 0 %
Date Time Interrogation Session: 20230215095851
Implantable Lead Implant Date: 20120302
Implantable Lead Implant Date: 20120302
Implantable Lead Location: 753859
Implantable Lead Location: 753860
Implantable Lead Model: 5076
Implantable Pulse Generator Implant Date: 20120302
Lead Channel Impedance Value: 476 Ohm
Lead Channel Impedance Value: 637 Ohm
Lead Channel Pacing Threshold Amplitude: 0.5 V
Lead Channel Pacing Threshold Amplitude: 0.5 V
Lead Channel Pacing Threshold Pulse Width: 0.4 ms
Lead Channel Pacing Threshold Pulse Width: 0.4 ms
Lead Channel Setting Pacing Amplitude: 2 V
Lead Channel Setting Pacing Amplitude: 2.5 V
Lead Channel Setting Pacing Pulse Width: 0.4 ms
Lead Channel Setting Sensing Sensitivity: 4 mV

## 2021-12-29 NOTE — Telephone Encounter (Signed)
Please advise 

## 2022-01-02 NOTE — Progress Notes (Signed)
Remote pacemaker transmission.   

## 2022-01-26 DIAGNOSIS — L820 Inflamed seborrheic keratosis: Secondary | ICD-10-CM | POA: Diagnosis not present

## 2022-01-26 DIAGNOSIS — Z48817 Encounter for surgical aftercare following surgery on the skin and subcutaneous tissue: Secondary | ICD-10-CM | POA: Diagnosis not present

## 2022-01-26 DIAGNOSIS — L538 Other specified erythematous conditions: Secondary | ICD-10-CM | POA: Diagnosis not present

## 2022-01-26 DIAGNOSIS — R208 Other disturbances of skin sensation: Secondary | ICD-10-CM | POA: Diagnosis not present

## 2022-01-31 ENCOUNTER — Encounter: Payer: Self-pay | Admitting: Internal Medicine

## 2022-01-31 NOTE — Progress Notes (Signed)
? ? ? ? ?Subjective:  ? ? Patient ID: [name], male    DOB: 08-30-1946, 76 y.o.   MRN: [id number] ? ?This visit occurred during the SARS-CoV-2 public health emergency.  Safety protocols were in place, including screening questions prior to the visit, additional usage of staff PPE, and extensive cleaning of exam room while observing appropriate contact time as indicated for disinfecting solutions.   ? ? ?HPI ?[name] is here for follow up of his chronic medical problems, including HTN, HLD, hyperglycemia, gout, chronic headaches, knee pain, right hip pain, right lower back (Dr. Georgina Snell) ? ?Dry cough intermittently - it may be 2-3 times and goes away.  At least 1-2 times a day it comes.  It is very forceful and hurts.  It occurs randomly.   ? ?Medications and allergies reviewed with patient and updated if appropriate. ? ?Current Outpatient Medications on File Prior to Visit  ?Medication Sig Dispense Refill  ? allopurinol (ZYLOPRIM) 300 MG tablet TAKE 0.5 TABLETS BY MOUTH DAILY. 45 tablet 1  ? clobetasol (TEMOVATE) 0.05 % cream Apply 1 application topically daily as needed (itching).    ? fish oil-omega-3 fatty acids 1000 MG capsule Take 1 g by mouth 2 (two) times daily.    ? fluorouracil (EFUDEX) 5 % cream Apply 1 application topically daily.    ? gabapentin (NEURONTIN) 300 MG capsule TAKE 1 CAPSULE BY MOUTH EVERYDAY AT BEDTIME 90 capsule 1  ? ibuprofen (ADVIL,MOTRIN) 200 MG tablet Take 200 mg by mouth daily.    ? losartan (COZAAR) 100 MG tablet Take 1 tablet (100 mg total) by mouth daily. 90 tablet 1  ? mometasone (ELOCON) 0.1 % lotion SMARTSIG:1 Topical Daily PRN    ? sildenafil (REVATIO) 20 MG tablet TAKE 3 TO 5 TABLETS BY MOUTH AS NEEDED 100 tablet 0  ? simvastatin (ZOCOR) 40 MG tablet TAKE 1 TABLET BY MOUTH DAILY AT 6 PM. 90 tablet 1  ? ?No current facility-administered medications on file prior to visit.  ? ? ? ?Review of Systems  ?Constitutional:  Negative for fever.  ?HENT:  Positive for postnasal drip  (mostly in AM) and sneezing (mostly in AM).   ?Respiratory:  Positive for cough. Negative for shortness of breath and wheezing.   ?Cardiovascular:  Negative for chest pain, palpitations and leg swelling.  ?Neurological:  Positive for headaches (improved). Negative for light-headedness.  ? ?   ?Objective:  ? ?Vitals:  ? 02/01/22 0834  ?BP: 116/70  ?Pulse: 90  ?Temp: 98 ?F (36.7 ?C)  ?SpO2: 98%  ? ?BP Readings from Last 3 Encounters:  ?02/01/22 116/70  ?11/22/21 112/78  ?10/17/21 (!) 149/84  ? ?Wt Readings from Last 3 Encounters:  ?02/01/22 233 lb (105.7 kg)  ?11/22/21 228 lb (103.4 kg)  ?10/10/21 233 lb (105.7 kg)  ? ?Body mass index is 27.63 kg/m?. ? ?  ?Physical Exam ?Constitutional:   ?   General: He is not in acute distress. ?   Appearance: Normal appearance. He is not ill-appearing.  ?HENT:  ?   Head: Normocephalic and atraumatic.  ?Eyes:  ?   Conjunctiva/sclera: Conjunctivae normal.  ?Cardiovascular:  ?   Rate and Rhythm: Normal rate and regular rhythm.  ?   Heart sounds: Normal heart sounds. No murmur heard. ?Pulmonary:  ?   Effort: Pulmonary effort is normal. No respiratory distress.  ?   Breath sounds: Normal breath sounds. No wheezing or rales.  ?Musculoskeletal:  ?   Right lower leg: No edema.  ?  Left lower leg: No edema.  ?Skin: ?   General: Skin is warm and dry.  ?   Findings: No rash.  ?Neurological:  ?   Mental Status: He is alert. Mental status is at baseline.  ?Psychiatric:     ?   Mood and Affect: Mood normal.  ? ?   ? ?Lab Results  ?Component Value Date  ? WBC 4.5 08/04/2021  ? HGB 14.8 08/04/2021  ? HCT 43.8 08/04/2021  ? PLT 143.0 (L) 08/04/2021  ? GLUCOSE 96 08/04/2021  ? CHOL 177 08/04/2021  ? TRIG 154.0 (H) 08/04/2021  ? HDL 59.90 08/04/2021  ? LDLDIRECT 109.1 08/13/2008  ? [place] 86 08/04/2021  ? ALT 50 08/04/2021  ? AST 46 (H) 08/04/2021  ? NA 138 08/04/2021  ? K 4.2 08/04/2021  ? CL 102 08/04/2021  ? CREATININE 1.24 08/04/2021  ? BUN 20 08/04/2021  ? CO2 26 08/04/2021  ? TSH 3.78  02/02/2021  ? PSA 0.87 01/20/2016  ? INR 1.02 01/13/2011  ? HGBA1C 5.4 08/04/2021  ? ? ? ?Assessment & Plan:  ? ? ?See Problem List for Assessment and Plan of chronic medical problems.  ? ? ?

## 2022-01-31 NOTE — Patient Instructions (Addendum)
? ? ? ?  Blood work was ordered.   ? ? ? ?Medications changes include :   none ? ? ? ? ?Return in about 6 months (around 08/04/2022) for follow up. ? ?

## 2022-02-01 ENCOUNTER — Encounter: Payer: Self-pay | Admitting: Internal Medicine

## 2022-02-01 ENCOUNTER — Ambulatory Visit (INDEPENDENT_AMBULATORY_CARE_PROVIDER_SITE_OTHER): Payer: PPO | Admitting: Internal Medicine

## 2022-02-01 ENCOUNTER — Other Ambulatory Visit: Payer: Self-pay

## 2022-02-01 VITALS — BP 116/70 | HR 90 | Temp 98.0°F | Ht 77.0 in | Wt 233.0 lb

## 2022-02-01 DIAGNOSIS — R519 Headache, unspecified: Secondary | ICD-10-CM | POA: Diagnosis not present

## 2022-02-01 DIAGNOSIS — G8929 Other chronic pain: Secondary | ICD-10-CM | POA: Diagnosis not present

## 2022-02-01 DIAGNOSIS — I10 Essential (primary) hypertension: Secondary | ICD-10-CM

## 2022-02-01 DIAGNOSIS — R739 Hyperglycemia, unspecified: Secondary | ICD-10-CM | POA: Diagnosis not present

## 2022-02-01 DIAGNOSIS — E7849 Other hyperlipidemia: Secondary | ICD-10-CM | POA: Diagnosis not present

## 2022-02-01 DIAGNOSIS — I700 Atherosclerosis of aorta: Secondary | ICD-10-CM

## 2022-02-01 DIAGNOSIS — R053 Chronic cough: Secondary | ICD-10-CM | POA: Insufficient documentation

## 2022-02-01 DIAGNOSIS — Z8739 Personal history of other diseases of the musculoskeletal system and connective tissue: Secondary | ICD-10-CM

## 2022-02-01 DIAGNOSIS — R052 Subacute cough: Secondary | ICD-10-CM | POA: Insufficient documentation

## 2022-02-01 DIAGNOSIS — R058 Other specified cough: Secondary | ICD-10-CM

## 2022-02-01 DIAGNOSIS — I442 Atrioventricular block, complete: Secondary | ICD-10-CM | POA: Diagnosis not present

## 2022-02-01 LAB — LIPID PANEL
Cholesterol: 160 mg/dL (ref 0–200)
HDL: 63.9 mg/dL (ref >39.00)
LDL Cholesterol: 74 mg/dL (ref 0–99)
NonHDL: 95.96
Total CHOL/HDL Ratio: 3
Triglycerides: 110 mg/dL (ref 0.0–149.0)
VLDL: 22 mg/dL (ref 0.0–40.0)

## 2022-02-01 LAB — CBC WITH DIFFERENTIAL/PLATELET
Basophils Absolute: 0 10*3/uL (ref 0.0–0.1)
Basophils Relative: 0.6 % (ref 0.0–3.0)
Eosinophils Absolute: 0.2 10*3/uL (ref 0.0–0.7)
Eosinophils Relative: 4.2 % (ref 0.0–5.0)
HCT: 45.5 % (ref 39.0–52.0)
Hemoglobin: 15.5 g/dL (ref 13.0–17.0)
Lymphocytes Relative: 38.8 % (ref 12.0–46.0)
Lymphs Abs: 2.1 10*3/uL (ref 0.7–4.0)
MCHC: 34.1 g/dL (ref 30.0–36.0)
MCV: 104.4 fl — ABNORMAL HIGH (ref 78.0–100.0)
Monocytes Absolute: 0.5 10*3/uL (ref 0.1–1.0)
Monocytes Relative: 9 % (ref 3.0–12.0)
Neutro Abs: 2.6 10*3/uL (ref 1.4–7.7)
Neutrophils Relative %: 47.4 % (ref 43.0–77.0)
Platelets: 162 10*3/uL (ref 150.0–400.0)
RBC: 4.36 Mil/uL (ref 4.22–5.81)
RDW: 13.9 % (ref 11.5–15.5)
WBC: 5.4 10*3/uL (ref 4.0–10.5)

## 2022-02-01 LAB — COMPREHENSIVE METABOLIC PANEL
ALT: 36 U/L (ref 0–53)
AST: 40 U/L — ABNORMAL HIGH (ref 0–37)
Albumin: 4.6 g/dL (ref 3.5–5.2)
Alkaline Phosphatase: 38 U/L — ABNORMAL LOW (ref 39–117)
BUN: 14 mg/dL (ref 6–23)
CO2: 28 mEq/L (ref 19–32)
Calcium: 10.3 mg/dL (ref 8.4–10.5)
Chloride: 100 mEq/L (ref 96–112)
Creatinine, Ser: 1.42 mg/dL (ref 0.40–1.50)
GFR: 48.82 mL/min — ABNORMAL LOW (ref >60.00)
Glucose, Bld: 108 mg/dL — ABNORMAL HIGH (ref 70–99)
Potassium: 4.2 mEq/L (ref 3.5–5.1)
Sodium: 137 mEq/L (ref 135–145)
Total Bilirubin: 0.7 mg/dL (ref 0.2–1.2)
Total Protein: 7 g/dL (ref 6.0–8.3)

## 2022-02-01 MED ORDER — SIMVASTATIN 40 MG PO TABS: ORAL_TABLET | ORAL | 1 refills | Status: DC

## 2022-02-01 MED ORDER — ALLOPURINOL 300 MG PO TABS: ORAL_TABLET | ORAL | 1 refills | Status: DC

## 2022-02-01 NOTE — Addendum Note (Signed)
Addended by: Binnie Rail on: 02/01/2022 09:04 AM ? ? Modules accepted: Orders ? ?

## 2022-02-01 NOTE — Assessment & Plan Note (Signed)
Chronic Blood pressure well controlled CMP Continue losartan 100 mg daily 

## 2022-02-01 NOTE — Assessment & Plan Note (Addendum)
Chronic ?Fairly controlled - improved at little ?Taking ibuprofen as needed - taking less ?Continue gabapentin 300 mg nightly ?

## 2022-02-01 NOTE — Assessment & Plan Note (Signed)
Acute ?Few weeks of dry couple episodes 1-2 times a day - occurs randomly ?Lungs clear ?Deferred further evaluation at this time -will monitor ?

## 2022-02-01 NOTE — Assessment & Plan Note (Signed)
Chronic ?Controlled, Stable ?Continue allopurinol 150 mg daily ?

## 2022-02-01 NOTE — Assessment & Plan Note (Signed)
Chronic ?Regular exercise and healthy diet encouraged ?Check lipid panel  ?Continue statin 40 mg daily ?

## 2022-02-01 NOTE — Assessment & Plan Note (Signed)
Chronic ?Sugars have been in the normal range ?Will monitor A1c periodically ? ?

## 2022-02-01 NOTE — Assessment & Plan Note (Signed)
Chronic ?On simvastatin 40 mg daily ?Lipids today ? ?

## 2022-02-01 NOTE — Assessment & Plan Note (Signed)
Has PPM ?Following with cardiology ?

## 2022-02-02 ENCOUNTER — Encounter: Payer: Self-pay | Admitting: Internal Medicine

## 2022-02-07 MED ORDER — ROSUVASTATIN CALCIUM 20 MG PO TABS: 20.0000 mg | ORAL_TABLET | Freq: Every day | ORAL | 3 refills | Status: DC

## 2022-02-17 ENCOUNTER — Other Ambulatory Visit: Payer: Self-pay | Admitting: Internal Medicine

## 2022-02-20 ENCOUNTER — Encounter: Payer: Self-pay | Admitting: Internal Medicine

## 2022-03-29 ENCOUNTER — Ambulatory Visit (INDEPENDENT_AMBULATORY_CARE_PROVIDER_SITE_OTHER): Payer: PPO

## 2022-03-29 DIAGNOSIS — I442 Atrioventricular block, complete: Secondary | ICD-10-CM | POA: Diagnosis not present

## 2022-03-30 ENCOUNTER — Ambulatory Visit: Payer: Self-pay

## 2022-03-30 ENCOUNTER — Ambulatory Visit: Payer: PPO | Admitting: Family Medicine

## 2022-03-30 VITALS — Wt 234.0 lb

## 2022-03-30 DIAGNOSIS — M545 Low back pain, unspecified: Secondary | ICD-10-CM

## 2022-03-30 DIAGNOSIS — M533 Sacrococcygeal disorders, not elsewhere classified: Secondary | ICD-10-CM | POA: Diagnosis not present

## 2022-03-30 DIAGNOSIS — G8929 Other chronic pain: Secondary | ICD-10-CM

## 2022-03-30 NOTE — Patient Instructions (Addendum)
Good to see you today.  You had B SIJ injections.  Call or go to the ER if you develop a large red swollen joint with extreme pain or oozing puss.   Follow-up: as needed

## 2022-03-30 NOTE — Progress Notes (Signed)
I, [name], LAT, ATC acting as a scribe for [name], MD.  [name] is a 76 y.o. male who presents to [place] at [facility] today for R-sided LBP  due to spinal stenosis w/ radiating pain into his R LE to his post calf and bilat hip pain due to greater trochanteric bursitis and hip abductor tendinopathy.  Pt's last facet joint was performed on December 2022. He was last seen by Dr. Georgina Snell on 10/10/21 and facet injections were ordered for L4-L5 and L5-S1 and performed on 10/17/21. Today, pt reports chronic low back/SI joint pain.  He has had SI joint injections last in June 2022 that he thought worked reasonably well.  He thinks his pain today is more consistent with SI joint pain that he had previously.  Dx imaging: 08/10/21 L-spine CT myelogram             05/11/21 R hip & L-spine XR  Pertinent review of systems: No fevers or chills  Relevant historical information: Pacemaker   Exam:  Wt 234 lb (106.1 kg)   BMI 27.75 kg/m  General: Well Developed, well nourished, and in no acute distress.   MSK: L-spine: Nontender midline.  Decreased lumbar motion pain with extension. Nontender midline.  Tender palpation bilateral SI joints.    Lab and Radiology Results  Procedure: Real-time Ultrasound Guided Injection of right SI joint Device: Philips Affiniti 50G Images permanently stored and available for review in PACS Verbal informed consent obtained.  Discussed risks and benefits of procedure. Warned about infection, bleeding, hyperglycemia damage to structures among others. Patient expresses understanding and agreement Time-out conducted.   Noted no overlying erythema, induration, or other signs of local infection.   Skin prepped in a sterile fashion.   Local anesthesia: Topical Ethyl chloride.   With sterile technique and under real time ultrasound guidance: 40 mg of Kenalog and 2 mL of Marcaine injected into right SI joint. Fluid seen entering the  joint capsule.   Completed without difficulty   Pain immediately resolved suggesting accurate placement of the medication.   Advised to call if fevers/chills, erythema, induration, drainage, or persistent bleeding.   Images permanently stored and available for review in the ultrasound unit.  Impression: Technically successful ultrasound guided injection.    Procedure: Real-time Ultrasound Guided Injection of left SI joint Device: Philips Affiniti 50G Images permanently stored and available for review in PACS Verbal informed consent obtained.  Discussed risks and benefits of procedure. Warned about infection, bleeding, hyperglycemia damage to structures among others. Patient expresses understanding and agreement Time-out conducted.   Noted no overlying erythema, induration, or other signs of local infection.   Skin prepped in a sterile fashion.   Local anesthesia: Topical Ethyl chloride.   With sterile technique and under real time ultrasound guidance: 40 mg of Kenalog and 2 mL of Marcaine injected into left SI joint. Fluid seen entering the joint capsule.   Completed without difficulty   Pain immediately resolved suggesting accurate placement of the medication.   Advised to call if fevers/chills, erythema, induration, drainage, or persistent bleeding.   Images permanently stored and available for review in the ultrasound unit.  Impression: Technically successful ultrasound guided injection.        Assessment and Plan: 76 y.o. male with bilateral chronic low back pain.  Source of pain is likely multifactorial.  He was thought to have facet generated back pain in the past as well as SI joint back pain.  He  thinks today's pain is more consistent with SI joint pain and after discussion we agreed to proceed with trial of bilateral SI joint injection.  This seemed to help in clinic.  We will see how he does in the medium term.  If this injection is not a successful as we would like would  recommend repeat trial of facet injections.  He had reasonable response to L4-L5 and L5-S1 facet injections on the right last year based on CT myelogram results.  He will let me know how he is feeling.  Recheck back as needed.   PDMP not reviewed this encounter. Orders Placed This Encounter  Procedures   [place] LIMITED JOINT SPACE STRUCTURES LOW BILAT(NO LINKED CHARGES)    Order Specific Question:   Reason for Exam (SYMPTOM  OR DIAGNOSIS REQUIRED)    Answer:   si inj    Order Specific Question:   Preferred imaging location?    Answer:   [place]   No orders of the defined types were placed in this encounter.    Discussed warning signs or symptoms. Please see discharge instructions. Patient expresses understanding.   The above documentation has been reviewed and is accurate and complete [name], M.D.

## 2022-03-31 ENCOUNTER — Encounter (HOSPITAL_BASED_OUTPATIENT_CLINIC_OR_DEPARTMENT_OTHER): Payer: Self-pay | Admitting: Internal Medicine

## 2022-03-31 LAB — CUP PACEART REMOTE DEVICE CHECK
Battery Impedance: 2215 Ohm
Battery Remaining Longevity: 31 mo
Battery Voltage: 2.73 V
Brady Statistic AP VP Percent: 3 %
Brady Statistic AP VS Percent: 0 %
Brady Statistic AS VP Percent: 97 %
Brady Statistic AS VS Percent: 0 %
Date Time Interrogation Session: 20230517103432
Implantable Lead Implant Date: 20120302
Implantable Lead Implant Date: 20120302
Implantable Lead Location: 753859
Implantable Lead Location: 753860
Implantable Lead Model: 5076
Implantable Pulse Generator Implant Date: 20120302
Lead Channel Impedance Value: 511 Ohm
Lead Channel Impedance Value: 666 Ohm
Lead Channel Pacing Threshold Amplitude: 0.5 V
Lead Channel Pacing Threshold Amplitude: 0.625 V
Lead Channel Pacing Threshold Pulse Width: 0.4 ms
Lead Channel Pacing Threshold Pulse Width: 0.4 ms
Lead Channel Setting Pacing Amplitude: 2 V
Lead Channel Setting Pacing Amplitude: 2.5 V
Lead Channel Setting Pacing Pulse Width: 0.4 ms
Lead Channel Setting Sensing Sensitivity: 4 mV

## 2022-03-31 NOTE — Telephone Encounter (Signed)
[place] patient, please advise

## 2022-04-03 DIAGNOSIS — L821 Other seborrheic keratosis: Secondary | ICD-10-CM | POA: Diagnosis not present

## 2022-04-03 DIAGNOSIS — D485 Neoplasm of uncertain behavior of skin: Secondary | ICD-10-CM | POA: Diagnosis not present

## 2022-04-03 DIAGNOSIS — D0461 Carcinoma in situ of skin of right upper limb, including shoulder: Secondary | ICD-10-CM | POA: Diagnosis not present

## 2022-04-03 DIAGNOSIS — L814 Other melanin hyperpigmentation: Secondary | ICD-10-CM | POA: Diagnosis not present

## 2022-04-03 DIAGNOSIS — L578 Other skin changes due to chronic exposure to nonionizing radiation: Secondary | ICD-10-CM | POA: Diagnosis not present

## 2022-04-03 DIAGNOSIS — S50812A Abrasion of left forearm, initial encounter: Secondary | ICD-10-CM | POA: Diagnosis not present

## 2022-04-03 DIAGNOSIS — D225 Melanocytic nevi of trunk: Secondary | ICD-10-CM | POA: Diagnosis not present

## 2022-04-03 DIAGNOSIS — L570 Actinic keratosis: Secondary | ICD-10-CM | POA: Diagnosis not present

## 2022-04-03 DIAGNOSIS — Z85828 Personal history of other malignant neoplasm of skin: Secondary | ICD-10-CM | POA: Diagnosis not present

## 2022-04-03 DIAGNOSIS — L910 Hypertrophic scar: Secondary | ICD-10-CM | POA: Diagnosis not present

## 2022-04-11 NOTE — Progress Notes (Signed)
Remote pacemaker transmission.   

## 2022-06-26 ENCOUNTER — Other Ambulatory Visit: Payer: Self-pay | Admitting: Internal Medicine

## 2022-06-28 ENCOUNTER — Ambulatory Visit (INDEPENDENT_AMBULATORY_CARE_PROVIDER_SITE_OTHER): Payer: PPO

## 2022-06-28 DIAGNOSIS — I442 Atrioventricular block, complete: Secondary | ICD-10-CM

## 2022-06-28 LAB — CUP PACEART REMOTE DEVICE CHECK
Battery Impedance: 2428 Ohm
Battery Remaining Longevity: 27 mo
Battery Voltage: 2.73 V
Brady Statistic AP VP Percent: 4 %
Brady Statistic AP VS Percent: 0 %
Brady Statistic AS VP Percent: 96 %
Brady Statistic AS VS Percent: 0 %
Date Time Interrogation Session: 20230816113646
Implantable Lead Implant Date: 20120302
Implantable Lead Implant Date: 20120302
Implantable Lead Location: 753859
Implantable Lead Location: 753860
Implantable Lead Model: 5076
Implantable Pulse Generator Implant Date: 20120302
Lead Channel Impedance Value: 516 Ohm
Lead Channel Impedance Value: 612 Ohm
Lead Channel Pacing Threshold Amplitude: 0.5 V
Lead Channel Pacing Threshold Amplitude: 0.75 V
Lead Channel Pacing Threshold Pulse Width: 0.4 ms
Lead Channel Pacing Threshold Pulse Width: 0.4 ms
Lead Channel Setting Pacing Amplitude: 2 V
Lead Channel Setting Pacing Amplitude: 2.5 V
Lead Channel Setting Pacing Pulse Width: 0.4 ms
Lead Channel Setting Sensing Sensitivity: 4 mV

## 2022-06-30 ENCOUNTER — Ambulatory Visit: Payer: PPO | Admitting: Family Medicine

## 2022-06-30 ENCOUNTER — Ambulatory Visit: Payer: Self-pay

## 2022-06-30 VITALS — BP 158/88 | HR 104 | Ht 77.0 in | Wt 231.8 lb

## 2022-06-30 DIAGNOSIS — M533 Sacrococcygeal disorders, not elsewhere classified: Secondary | ICD-10-CM

## 2022-06-30 DIAGNOSIS — G8929 Other chronic pain: Secondary | ICD-10-CM

## 2022-06-30 NOTE — Patient Instructions (Signed)
Thank you for coming in today.   Call or go to the ER if you develop a large red swollen joint with extreme pain or oozing puss.    Recheck in 3 months.

## 2022-06-30 NOTE — Progress Notes (Signed)
I, [name], LAT, ATC acting as a scribe for Jonathan Leader, MD.  [name] is a 76 y.o. male who presents to [place] at [facility] today for f/u bilat SIJ and bilat low back pain. Pt's last facet joint was performed on December 2022. He was last seen by Dr. Georgina Snell on 03/30/22 and was given bilat SIJ steroid injections. Today, pt reports L>R, SIJ. Pt c/o pain is sometimes going into bilat buttock.  No radiating down the legs.  Dx imaging: 08/10/21 L-spine CT myelogram             05/11/21 R hip & L-spine XR  Pertinent review of systems: No fevers or chills  Relevant historical information: Heart disease   Exam:  BP (!) 158/88   Pulse (!) 104   Ht '6\' 5"'$  (1.956 m)   Wt 231 lb 12.8 oz (105.1 kg)   SpO2 97%   BMI 27.49 kg/m  General: Well Developed, well nourished, and in no acute distress.   MSK: L-spine/pelvis: Tender palpation bilateral SI joints.  Nontender spinal midline.  Decreased hip and lumbar motion.    Lab and Radiology Results  Procedure: Real-time Ultrasound Guided Injection of right SI joint Device: Philips Affiniti 50G Images permanently stored and available for review in PACS Verbal informed consent obtained.  Discussed risks and benefits of procedure. Warned about infection, bleeding, hyperglycemia damage to structures among others. Patient expresses understanding and agreement Time-out conducted.   Noted no overlying erythema, induration, or other signs of local infection.   Skin prepped in a sterile fashion.   Local anesthesia: Topical Ethyl chloride.   With sterile technique and under real time ultrasound guidance: 40 mg of Kenalog and 2 mL of Marcaine injected into SI joint. Fluid seen entering the joint cleft.   Completed without difficulty   Pain immediately resolved suggesting accurate placement of the medication.   Advised to call if fevers/chills, erythema, induration, drainage, or persistent bleeding.   Images permanently  stored and available for review in the ultrasound unit.  Impression: Technically successful ultrasound guided injection.    Procedure: Real-time Ultrasound Guided Injection of left SI joint Device: Philips Affiniti 50G Images permanently stored and available for review in PACS Verbal informed consent obtained.  Discussed risks and benefits of procedure. Warned about infection, bleeding, hyperglycemia damage to structures among others. Patient expresses understanding and agreement Time-out conducted.   Noted no overlying erythema, induration, or other signs of local infection.   Skin prepped in a sterile fashion.   Local anesthesia: Topical Ethyl chloride.   With sterile technique and under real time ultrasound guidance: 40 mg of Kenalog and 2 mL of Marcaine injected into SI joint. Fluid seen entering the joint cleft.   Completed without difficulty   Pain immediately resolved suggesting accurate placement of the medication.   Advised to call if fevers/chills, erythema, induration, drainage, or persistent bleeding.   Images permanently stored and available for review in the ultrasound unit.  Impression: Technically successful ultrasound guided injection.        Assessment and Plan: 76 y.o. male with bilateral low back pain thought primarily due to be bilateral SI joint pain and dysfunction.  He has had good benefit in the past from SI joint injections.  The last injection bilaterally lasted 2-1/2 almost 3 months.  Plan to repeat injection today.  Hopefully will get 3 months or close to 3 months out of today's injection.  If he does not get close  at all but does get temporary benefit he is a good candidate for trial of ablation which I can refer him for.  Recheck in 3 months.  Alternative locations for pain could be facet injections.  He had benefit from facet injections.  He had response at L4-L5 and L5-S1.  We may want to retry those or proceed to ablation from that location as  well.   PDMP not reviewed this encounter. Orders Placed This Encounter  Procedures   [place] LIMITED JOINT SPACE STRUCTURES LOW BILAT(NO LINKED CHARGES)    Order Specific Question:   Reason for Exam (SYMPTOM  OR DIAGNOSIS REQUIRED)    Answer:   SI joint pain    Order Specific Question:   Preferred imaging location?    Answer:   [place]   No orders of the defined types were placed in this encounter.    Discussed warning signs or symptoms. Please see discharge instructions. Patient expresses understanding.   The above documentation has been reviewed and is accurate and complete [name], M.D.

## 2022-07-27 ENCOUNTER — Other Ambulatory Visit: Payer: Self-pay | Admitting: Internal Medicine

## 2022-07-27 NOTE — Progress Notes (Signed)
Remote pacemaker transmission.   

## 2022-08-07 ENCOUNTER — Other Ambulatory Visit: Payer: Self-pay | Admitting: Internal Medicine

## 2022-08-08 ENCOUNTER — Encounter: Payer: Self-pay | Admitting: Internal Medicine

## 2022-08-08 NOTE — Patient Instructions (Addendum)
     Flu immunization administered today.     Blood work was ordered.  Have a chest xray downstairs.     Medications changes include :   only take Tylenol for pain     Return in about 6 months (around 02/07/2023) for Physical Exam.

## 2022-08-08 NOTE — Progress Notes (Unsigned)
Subjective:    Patient ID: [name], male    DOB: 04-11-1946, 76 y.o.   MRN: [id number]     HPI Alfard is here for follow up of his chronic medical problems, including htn, hld, hyperglycemia, gout, chronic headaches, knee pain, right hip pain, right lower back pain  Overall he is doing well.  He is still experiencing back pain-the injections helped and he knows he will have to go back for additional injections.  He is following with Dr. Georgina Snell.  Medications and allergies reviewed with patient and updated if appropriate.  Current Outpatient Medications on File Prior to Visit  Medication Sig Dispense Refill   allopurinol (ZYLOPRIM) 300 MG tablet TAKE 0.5 TABLETS BY MOUTH DAILY. 45 tablet 1   clobetasol (TEMOVATE) 0.05 % cream Apply 1 application topically daily as needed (itching).     fish oil-omega-3 fatty acids 1000 MG capsule Take 1 g by mouth 2 (two) times daily.     fluorouracil (EFUDEX) 5 % cream Apply 1 application topically daily.     gabapentin (NEURONTIN) 300 MG capsule TAKE 1 CAPSULE BY MOUTH EVERYDAY AT BEDTIME 90 capsule 1   losartan (COZAAR) 100 MG tablet TAKE 1 TABLET BY MOUTH EVERY DAY 90 tablet 1   mometasone (ELOCON) 0.1 % lotion SMARTSIG:1 Topical Daily PRN     sildenafil (REVATIO) 20 MG tablet TAKE 3 TO 5 TABLETS BY MOUTH AS NEEDED 100 tablet 0   simvastatin (ZOCOR) 40 MG tablet TAKE 1 TABLET BY MOUTH DAILY AT 6 PM. 90 tablet 2   No current facility-administered medications on file prior to visit.     Review of Systems  Constitutional:  Negative for fever.  HENT:  Positive for rhinorrhea (clear mucus) and sneezing (in morning when he first gets up). Negative for congestion and postnasal drip.   Eyes:  Negative for itching.  Respiratory:  Positive for cough (dry cough - 2-3 months - more in morning). Negative for shortness of breath and wheezing.   Cardiovascular:  Positive for chest pain (soreness in left chest when coughing and in the morning -  better during the day and when he moves around). Negative for palpitations and leg swelling.  Gastrointestinal:        No gerd  Neurological:  Negative for light-headedness and headaches.       Objective:   Vitals:   08/09/22 0820  BP: 134/82  Pulse: 75  Temp: 97.8 F (36.6 C)  SpO2: 99%   BP Readings from Last 3 Encounters:  08/09/22 134/82  06/30/22 (!) 158/88  02/01/22 116/70   Wt Readings from Last 3 Encounters:  08/09/22 227 lb (103 kg)  06/30/22 231 lb 12.8 oz (105.1 kg)  03/30/22 234 lb (106.1 kg)   Body mass index is 26.92 kg/m.    Physical Exam Constitutional:      General: He is not in acute distress.    Appearance: Normal appearance. He is not ill-appearing.  HENT:     Head: Normocephalic and atraumatic.  Eyes:     Conjunctiva/sclera: Conjunctivae normal.  Cardiovascular:     Rate and Rhythm: Normal rate and regular rhythm.     Heart sounds: Normal heart sounds. No murmur heard. Pulmonary:     Effort: Pulmonary effort is normal. No respiratory distress.     Breath sounds: Normal breath sounds. No wheezing or rales.  Musculoskeletal:     Right lower leg: No edema.     Left lower leg: No  edema.  Skin:    General: Skin is warm and dry.     Findings: No rash.  Neurological:     Mental Status: He is alert. Mental status is at baseline.  Psychiatric:        Mood and Affect: Mood normal.        Lab Results  Component Value Date   WBC 5.4 02/01/2022   HGB 15.5 02/01/2022   HCT 45.5 02/01/2022   PLT 162.0 02/01/2022   GLUCOSE 108 (H) 02/01/2022   CHOL 160 02/01/2022   TRIG 110.0 02/01/2022   HDL 63.90 02/01/2022   LDLDIRECT 109.1 08/13/2008   LDLCALC 74 02/01/2022   ALT 36 02/01/2022   AST 40 (H) 02/01/2022   NA 137 02/01/2022   K 4.2 02/01/2022   CL 100 02/01/2022   CREATININE 1.42 02/01/2022   BUN 14 02/01/2022   CO2 28 02/01/2022   TSH 3.78 02/02/2021   PSA 0.87 01/20/2016   INR 1.02 01/13/2011   HGBA1C 5.4 08/04/2021      Assessment & Plan:    See Problem List for Assessment and Plan of chronic medical problems.

## 2022-08-09 ENCOUNTER — Ambulatory Visit (INDEPENDENT_AMBULATORY_CARE_PROVIDER_SITE_OTHER): Payer: PPO | Admitting: Internal Medicine

## 2022-08-09 ENCOUNTER — Ambulatory Visit (INDEPENDENT_AMBULATORY_CARE_PROVIDER_SITE_OTHER): Payer: PPO

## 2022-08-09 ENCOUNTER — Encounter: Payer: Self-pay | Admitting: Internal Medicine

## 2022-08-09 VITALS — BP 134/82 | HR 75 | Temp 97.8°F | Ht 77.0 in | Wt 227.0 lb

## 2022-08-09 DIAGNOSIS — I10 Essential (primary) hypertension: Secondary | ICD-10-CM

## 2022-08-09 DIAGNOSIS — R059 Cough, unspecified: Secondary | ICD-10-CM | POA: Diagnosis not present

## 2022-08-09 DIAGNOSIS — R052 Subacute cough: Secondary | ICD-10-CM

## 2022-08-09 DIAGNOSIS — E7849 Other hyperlipidemia: Secondary | ICD-10-CM | POA: Diagnosis not present

## 2022-08-09 DIAGNOSIS — Z23 Encounter for immunization: Secondary | ICD-10-CM

## 2022-08-09 DIAGNOSIS — G8929 Other chronic pain: Secondary | ICD-10-CM

## 2022-08-09 DIAGNOSIS — R519 Headache, unspecified: Secondary | ICD-10-CM | POA: Diagnosis not present

## 2022-08-09 DIAGNOSIS — R079 Chest pain, unspecified: Secondary | ICD-10-CM | POA: Diagnosis not present

## 2022-08-09 DIAGNOSIS — Z8739 Personal history of other diseases of the musculoskeletal system and connective tissue: Secondary | ICD-10-CM

## 2022-08-09 DIAGNOSIS — R739 Hyperglycemia, unspecified: Secondary | ICD-10-CM | POA: Diagnosis not present

## 2022-08-09 LAB — LIPID PANEL
Cholesterol: 158 mg/dL (ref 0–200)
HDL: 67.6 mg/dL (ref >39.00)
LDL Cholesterol: 79 mg/dL (ref 0–99)
NonHDL: 90.71
Total CHOL/HDL Ratio: 2
Triglycerides: 60 mg/dL (ref 0.0–149.0)
VLDL: 12 mg/dL (ref 0.0–40.0)

## 2022-08-09 LAB — COMPREHENSIVE METABOLIC PANEL
ALT: 38 U/L (ref 0–53)
AST: 33 U/L (ref 0–37)
Albumin: 4.5 g/dL (ref 3.5–5.2)
Alkaline Phosphatase: 43 U/L (ref 39–117)
BUN: 17 mg/dL (ref 6–23)
CO2: 29 mEq/L (ref 19–32)
Calcium: 10.2 mg/dL (ref 8.4–10.5)
Chloride: 100 mEq/L (ref 96–112)
Creatinine, Ser: 1.35 mg/dL (ref 0.40–1.50)
GFR: 51.69 mL/min — ABNORMAL LOW (ref >60.00)
Glucose, Bld: 99 mg/dL (ref 70–99)
Potassium: 4.5 mEq/L (ref 3.5–5.1)
Sodium: 137 mEq/L (ref 135–145)
Total Bilirubin: 0.6 mg/dL (ref 0.2–1.2)
Total Protein: 7.3 g/dL (ref 6.0–8.3)

## 2022-08-09 LAB — HEMOGLOBIN A1C: Hgb A1c MFr Bld: 5.5 % (ref 4.6–6.5)

## 2022-08-09 LAB — URIC ACID: Uric Acid, Serum: 5 mg/dL (ref 4.0–7.8)

## 2022-08-09 MED ORDER — GABAPENTIN 300 MG PO CAPS: ORAL_CAPSULE | ORAL | 2 refills | Status: DC

## 2022-08-09 NOTE — Assessment & Plan Note (Signed)
Subacute States dry cough-May be starting 2-3 months ago, but earlier this spring he also complained of this as well Cough is dry in nature and occurs randomly-May be slightly worse in the morning Denies GERD, postnasal drainage, allergy symptoms No wheeze, shortness of breath We will get chest x-ray Discussed making sure he is drinking plenty of fluids and is not related to dryness Can consider switching losartan to a different medication Can refer to a specialist if cough persists

## 2022-08-09 NOTE — Assessment & Plan Note (Addendum)
Chronic Fairly controlled Continue gabapentin 300 mg at bedtime Was taking ibuprofen as needed for headaches-advised no NSAIDs secondary to CKD  He also takes Tylenol as needed at time so recommended that he only takes Tylenol

## 2022-08-09 NOTE — Assessment & Plan Note (Signed)
Chronic No issues with gout recently-controlled Check uric acid level Continue allopurinol 150 mg daily

## 2022-08-09 NOTE — Assessment & Plan Note (Signed)
Chronic Check a1c Low sugar / carb diet Stressed regular exercise  

## 2022-08-09 NOTE — Assessment & Plan Note (Addendum)
Chronic Blood pressure controlled CMP Continue losartan 100 mg daily

## 2022-08-09 NOTE — Assessment & Plan Note (Signed)
Chronic Regular exercise and healthy diet encouraged Check lipid panel  Continue simvastatin 40 mg daily 

## 2022-08-25 ENCOUNTER — Other Ambulatory Visit (HOSPITAL_BASED_OUTPATIENT_CLINIC_OR_DEPARTMENT_OTHER): Payer: Self-pay

## 2022-08-25 MED ORDER — COVID-19 MRNA 2023-2024 VACCINE (COMIRNATY) 0.3 ML INJECTION
INTRAMUSCULAR | 0 refills | Status: DC
  Filled 2022-08-25: qty 0.3, 1d supply, fill #0

## 2022-08-31 ENCOUNTER — Other Ambulatory Visit (HOSPITAL_BASED_OUTPATIENT_CLINIC_OR_DEPARTMENT_OTHER): Payer: Self-pay

## 2022-09-04 ENCOUNTER — Other Ambulatory Visit: Payer: Self-pay | Admitting: Internal Medicine

## 2022-09-07 DIAGNOSIS — L299 Pruritus, unspecified: Secondary | ICD-10-CM | POA: Diagnosis not present

## 2022-09-07 DIAGNOSIS — T50905A Adverse effect of unspecified drugs, medicaments and biological substances, initial encounter: Secondary | ICD-10-CM | POA: Diagnosis not present

## 2022-09-07 DIAGNOSIS — L578 Other skin changes due to chronic exposure to nonionizing radiation: Secondary | ICD-10-CM | POA: Diagnosis not present

## 2022-09-12 ENCOUNTER — Encounter: Payer: Self-pay | Admitting: Internal Medicine

## 2022-09-12 DIAGNOSIS — R053 Chronic cough: Secondary | ICD-10-CM

## 2022-09-14 NOTE — Addendum Note (Signed)
Addended by: Binnie Rail on: 09/14/2022 08:08 PM   Modules accepted: Orders

## 2022-09-25 ENCOUNTER — Ambulatory Visit (INDEPENDENT_AMBULATORY_CARE_PROVIDER_SITE_OTHER): Payer: PPO

## 2022-09-25 VITALS — Ht 77.0 in | Wt 227.0 lb

## 2022-09-25 DIAGNOSIS — Z0000 Encounter for general adult medical examination without abnormal findings: Secondary | ICD-10-CM

## 2022-09-25 NOTE — Patient Instructions (Signed)
[name] , Thank you for taking time to come for your Medicare Wellness Visit. I appreciate your ongoing commitment to your health goals. Please review the following plan we discussed and let me know if I can assist you in the future.   These are the goals we discussed:  Goals      Client understands the importance of follow-up with providers by attending scheduled visits        This is a list of the screening recommended for you and due dates:  Health Maintenance  Topic Date Due   Tetanus Vaccine  08/20/2022   Colon Cancer Screening  08/10/2023*   COVID-19 Vaccine (6 - Pfizer series) 12/26/2022   Medicare Annual Wellness Visit  09/26/2023   Pneumonia Vaccine  Completed   Flu Shot  Completed   Hepatitis C Screening: USPSTF Recommendation to screen - Ages 18-79 yo.  Completed   Zoster (Shingles) Vaccine  Completed   HPV Vaccine  Aged Out  *Topic was postponed. The date shown is not the original due date.    Advanced directives: Yes; Please bring a copy of your health care power of attorney and living will to the office at your convenience.  Conditions/risks identified: Yes  Next appointment: Follow up in one year for your annual wellness visit.   Preventive Care 74 Years and Older, Male  Preventive care refers to lifestyle choices and visits with your health care provider that can promote health and wellness. What does preventive care include? A yearly physical exam. This is also called an annual well check. Dental exams once or twice a year. Routine eye exams. Ask your health care provider how often you should have your eyes checked. Personal lifestyle choices, including: Daily care of your teeth and gums. Regular physical activity. Eating a healthy diet. Avoiding tobacco and drug use. Limiting alcohol use. Practicing safe sex. Taking low doses of aspirin every day. Taking vitamin and mineral supplements as recommended by your health care provider. What happens  during an annual well check? The services and screenings done by your health care provider during your annual well check will depend on your age, overall health, lifestyle risk factors, and family history of disease. Counseling  Your health care provider may ask you questions about your: Alcohol use. Tobacco use. Drug use. Emotional well-being. Home and relationship well-being. Sexual activity. Eating habits. History of falls. Memory and ability to understand (cognition). Work and work Statistician. Screening  You may have the following tests or measurements: Height, weight, and BMI. Blood pressure. Lipid and cholesterol levels. These may be checked every 5 years, or more frequently if you are over 74 years old. Skin check. Lung cancer screening. You may have this screening every year starting at age 74 if you have a 30-pack-year history of smoking and currently smoke or have quit within the past 15 years. Fecal occult blood test (FOBT) of the stool. You may have this test every year starting at age 74. Flexible sigmoidoscopy or colonoscopy. You may have a sigmoidoscopy every 5 years or a colonoscopy every 10 years starting at age 74. Prostate cancer screening. Recommendations will vary depending on your family history and other risks. Hepatitis C blood test. Hepatitis B blood test. Sexually transmitted disease (STD) testing. Diabetes screening. This is done by checking your blood sugar (glucose) after you have not eaten for a while (fasting). You may have this done every 1-3 years. Abdominal aortic aneurysm (AAA) screening. You may need this if you are  a current or former smoker. Osteoporosis. You may be screened starting at age 74 if you are at high risk. Talk with your health care provider about your test results, treatment options, and if necessary, the need for more tests. Vaccines  Your health care provider may recommend certain vaccines, such as: Influenza vaccine. This is  recommended every year. Tetanus, diphtheria, and acellular pertussis (Tdap, Td) vaccine. You may need a Td booster every 10 years. Zoster vaccine. You may need this after age 74. Pneumococcal 13-valent conjugate (PCV13) vaccine. One dose is recommended after age 74. Pneumococcal polysaccharide (PPSV23) vaccine. One dose is recommended after age 74. Talk to your health care provider about which screenings and vaccines you need and how often you need them. This information is not intended to replace advice given to you by your health care provider. Make sure you discuss any questions you have with your health care provider. Document Released: 11/26/2015 Document Revised: 07/19/2016 Document Reviewed: 08/31/2015 Elsevier Interactive Patient Education  2017 [place] Prevention in the Home Falls can cause injuries. They can happen to people of all ages. There are many things you can do to make your home safe and to help prevent falls. What can I do on the outside of my home? Regularly fix the edges of walkways and driveways and fix any cracks. Remove anything that might make you trip as you walk through a door, such as a raised step or threshold. Trim any bushes or trees on the path to your home. Use bright outdoor lighting. Clear any walking paths of anything that might make someone trip, such as rocks or tools. Regularly check to see if handrails are loose or broken. Make sure that both sides of any steps have handrails. Any raised decks and porches should have guardrails on the edges. Have any leaves, snow, or ice cleared regularly. Use sand or salt on walking paths during winter. Clean up any spills in your garage right away. This includes oil or grease spills. What can I do in the bathroom? Use night lights. Install grab bars by the toilet and in the tub and shower. Do not use towel bars as grab bars. Use non-skid mats or decals in the tub or shower. If you need to sit down in  the shower, use a plastic, non-slip stool. Keep the floor dry. Clean up any water that spills on the floor as soon as it happens. Remove soap buildup in the tub or shower regularly. Attach bath mats securely with double-sided non-slip rug tape. Do not have throw rugs and other things on the floor that can make you trip. What can I do in the bedroom? Use night lights. Make sure that you have a light by your bed that is easy to reach. Do not use any sheets or blankets that are too big for your bed. They should not hang down onto the floor. Have a firm chair that has side arms. You can use this for support while you get dressed. Do not have throw rugs and other things on the floor that can make you trip. What can I do in the kitchen? Clean up any spills right away. Avoid walking on wet floors. Keep items that you use a lot in easy-to-reach places. If you need to reach something above you, use a strong step stool that has a grab bar. Keep electrical cords out of the way. Do not use floor polish or wax that makes floors slippery. If you must  use wax, use non-skid floor wax. Do not have throw rugs and other things on the floor that can make you trip. What can I do with my stairs? Do not leave any items on the stairs. Make sure that there are handrails on both sides of the stairs and use them. Fix handrails that are broken or loose. Make sure that handrails are as long as the stairways. Check any carpeting to make sure that it is firmly attached to the stairs. Fix any carpet that is loose or worn. Avoid having throw rugs at the top or bottom of the stairs. If you do have throw rugs, attach them to the floor with carpet tape. Make sure that you have a light switch at the top of the stairs and the bottom of the stairs. If you do not have them, ask someone to add them for you. What else can I do to help prevent falls? Wear shoes that: Do not have high heels. Have rubber bottoms. Are comfortable  and fit you well. Are closed at the toe. Do not wear sandals. If you use a stepladder: Make sure that it is fully opened. Do not climb a closed stepladder. Make sure that both sides of the stepladder are locked into place. Ask someone to hold it for you, if possible. Clearly mark and make sure that you can see: Any grab bars or handrails. First and last steps. Where the edge of each step is. Use tools that help you move around (mobility aids) if they are needed. These include: Canes. Walkers. Scooters. Crutches. Turn on the lights when you go into a dark area. Replace any light bulbs as soon as they burn out. Set up your furniture so you have a clear path. Avoid moving your furniture around. If any of your floors are uneven, fix them. If there are any pets around you, be aware of where they are. Review your medicines with your doctor. Some medicines can make you feel dizzy. This can increase your chance of falling. Ask your doctor what other things that you can do to help prevent falls. This information is not intended to replace advice given to you by your health care provider. Make sure you discuss any questions you have with your health care provider. Document Released: 08/26/2009 Document Revised: 04/06/2016 Document Reviewed: 12/04/2014 Elsevier Interactive Patient Education  2017 [employer].

## 2022-09-25 NOTE — Progress Notes (Signed)
Virtual Visit via Telephone Note  I connected with  [place] on 09/25/22 at  1:30 PM EST by telephone and verified that I am speaking with the correct person using two identifiers.  Location: Patient: Home Provider: [place] Persons participating in the virtual visit: [place]   I discussed the limitations, risks, security and privacy concerns of performing an evaluation and management service by telephone and the availability of in person appointments. The patient expressed understanding and agreed to proceed.  Interactive audio and video telecommunications were attempted between this nurse and patient, however failed, due to patient having technical difficulties OR patient did not have access to video capability.  We continued and completed visit with audio only.  Some vital signs may be absent or patient reported.   Sheral Flow, LPN  Subjective:   [name] is a 76 y.o. male who presents for an Initial Medicare Annual Wellness Visit.  Review of Systems     Cardiac Risk Factors include: advanced age (>35mn, >>15women)     Objective:    Today's Vitals   09/25/22 1332  Weight: 227 lb (103 kg)  Height: '6\' 5"'$  (1.956 m)  PainSc: 0-No pain   Body mass index is 26.92 kg/m.     09/25/2022    1:35 PM 05/23/2021    8:16 AM 08/06/2020    9:23 AM  Advanced Directives  Does Patient Have a Medical Advance Directive? Yes No Yes  Type of AParamedicof [place] will  [place] will  Does patient want to make changes to medical advance directive?   No - Patient declined  Copy of [place] Chart? No - copy requested    Would patient like information on creating a medical advance directive?  No - Patient declined     Current Medications (verified) Outpatient Encounter Medications as of 09/25/2022  Medication Sig   allopurinol (ZYLOPRIM) 300 MG tablet TAKE  1/2 TABLET BY MOUTH EVERY DAY   clobetasol (TEMOVATE) 0.05 % cream Apply 1 application topically daily as needed (itching).   fish oil-omega-3 fatty acids 1000 MG capsule Take 1 g by mouth 2 (two) times daily.   fluorouracil (EFUDEX) 5 % cream Apply 1 application topically daily.   gabapentin (NEURONTIN) 300 MG capsule TAKE 1 CAPSULE BY MOUTH EVERYDAY AT BEDTIME   losartan (COZAAR) 100 MG tablet TAKE 1 TABLET BY MOUTH EVERY DAY   mometasone (ELOCON) 0.1 % lotion SMARTSIG:1 Topical Daily PRN   sildenafil (REVATIO) 20 MG tablet TAKE 3 TO 5 TABLETS BY MOUTH AS NEEDED   simvastatin (ZOCOR) 40 MG tablet TAKE 1 TABLET BY MOUTH DAILY AT 6 PM.   No facility-administered encounter medications on file as of 09/25/2022.    Allergies (verified) Patient has no known allergies.   History: Past Medical History:  Diagnosis Date   Complete heart block (HCC)    S/P  PPM by Dr ARayann Heman  Coronary artery disease    Non-obstructive. Pt had left heart catheterization in August 2009 showing a 30% mid-circumflex lesion, otherwise coronaries were clean angiographically   Hyperlipidemia    Hypertension    OSA (obstructive sleep apnea)    Probable; study never scheduled   Past Surgical History:  Procedure Laterality Date   [place]   2009- negative   COLONOSCOPY  2011   negative, [facility]   PACEMAKER INSERTION  01/13/11   MDT by JGreggory Brandyfor  complete heart block   Family History  Problem Relation Age of Onset   Heart attack Maternal Grandfather 70   Diabetes Father    Peripheral vascular disease Mother    Cancer Mother        ? primary   Arrhythmia Mother        AFIB   Gout Brother    Leukemia Brother    Other Sister        prediabetic   Stroke Neg Hx    Social History   Socioeconomic History   Marital status: Married    Spouse name: Not on file   Number of children: Not on file   Years of education: Not on file   Highest education level: Not on file  Occupational  History   Not on file  Tobacco Use   Smoking status: Former    Types: Cigarettes    Quit date: 11/13/2005    Years since quitting: 16.8   Smokeless tobacco: Never   Tobacco comments:    smoked 1965- 2007, up to 2 ppd  Vaping Use   Vaping Use: Never used  Substance and Sexual Activity   Alcohol use: Yes    Comment:  21 drinks/ week   Drug use: No   Sexual activity: Not on file  Other Topics Concern   Not on file  Social History Narrative   Exercise: regularly - golf and walks - does both a few times a week   Social Determinants of Health   Financial Resource Strain: Low Risk  (09/25/2022)   Overall Financial Resource Strain (CARDIA)    Difficulty of Paying Living Expenses: Not hard at all  Food Insecurity: No Food Insecurity (09/25/2022)   Hunger Vital Sign    Worried About Running Out of Food in the Last Year: Never true    [place] in the Last Year: Never true  Transportation Needs: No Transportation Needs (09/25/2022)   PRAPARE - Hydrologist (Medical): No    Lack of Transportation (Non-Medical): No  Physical Activity: Inactive (09/25/2022)   Exercise Vital Sign    Days of Exercise per Week: 0 days    Minutes of Exercise per Session: 0 min  Stress: No Stress Concern Present (09/25/2022)   [place]    Feeling of Stress : Not at all  Social Connections: [place] (09/25/2022)   Social Connection and Isolation Panel [NHANES]    Frequency of Communication with Friends and Family: More than three times a week    Frequency of Social Gatherings with Friends and Family: More than three times a week    Attends Religious Services: More than 4 times per year    Active Member of [employer] or Organizations: Yes    Attends Music therapist: More than 4 times per year    Marital Status: Married    Tobacco Counseling Counseling given: Not Answered Tobacco  comments: smoked 1965- 2007, up to 2 ppd   Clinical Intake:  Pre-visit preparation completed: Yes  Pain : No/denies pain Pain Score: 0-No pain     BMI - recorded: 26.92 (08/09/2022) Nutritional Status: BMI 25 -29 Overweight Nutritional Risks: None Diabetes: No  How often do you need to have someone help you when you read instructions, pamphlets, or other written materials from your doctor or pharmacy?: 1 - Never What is the last grade level you completed in school?: HSG  Diabetic? No  Interpreter Needed?: No  Information entered by :: Lisette Abu, LPN.   Activities of Daily Living    09/25/2022    1:40 PM  In your present state of health, do you have any difficulty performing the following activities:  Hearing? 0  Vision? 0  Difficulty concentrating or making decisions? 0  Walking or climbing stairs? 0  Dressing or bathing? 0  Doing errands, shopping? 0  Preparing Food and eating ? N  Using the Toilet? N  In the past six months, have you accidently leaked urine? N  Do you have problems with loss of bowel control? N  Managing your Medications? N  Managing your Finances? N  Housekeeping or managing your Housekeeping? N    Patient Care Team: Binnie Rail, MD as PCP - General (Internal Medicine) Mealor, Yetta Barre, MD as PCP - Electrophysiology (Cardiology) Juluis Rainier as Consulting Physician (Optometry)  Indicate any recent Medical Services you may have received from other than [facility] providers in the past year (date may be approximate).     Assessment:   This is a routine wellness examination for [name].  Hearing/Vision screen Hearing Screening - Comments:: Denies hearing difficulties   Vision Screening - Comments:: Wears rx glasses - up to date with routine eye exams with Alois Cliche, OD.   Dietary issues and exercise activities discussed: Current Exercise Habits: The patient does not participate in regular exercise at present, Type of exercise:  walking   Goals Addressed             This Visit's Progress    Client understands the importance of follow-up with providers by attending scheduled visits        Depression Screen    09/25/2022    1:40 PM 08/09/2022    8:24 AM 02/01/2022    8:59 AM 05/11/2021    1:47 PM 08/05/2020    8:46 AM 08/05/2019    8:33 AM 08/15/2018    8:24 AM  PHQ 2/9 Scores  PHQ - 2 Score 0 0 0 0 0 0 0  PHQ- 9 Score 0 1     0    Fall Risk    09/25/2022    1:36 PM 02/01/2022    8:59 AM 05/11/2021    1:47 PM 02/02/2021    8:25 AM 02/03/2020    8:26 AM  Fall Risk   Falls in the past year? 0 1 0 0 0  Number falls in past yr: 0 0 0 0 0  Injury with Fall? 0 0 0 1 0  Risk for fall due to : No Fall Risks No Fall Risks  No Fall Risks   Follow up Falls prevention discussed Falls evaluation completed  Falls evaluation completed     [place]:  Any stairs in or around the home? Yes  If so, are there any without handrails? No  Home free of loose throw rugs in walkways, pet beds, electrical cords, etc? Yes  Adequate lighting in your home to reduce risk of falls? Yes   ASSISTIVE DEVICES UTILIZED TO PREVENT FALLS:  Life alert? No  Use of a cane, walker or w/c? No  Grab bars in the bathroom? Yes  Shower chair or bench in shower? Yes  Elevated toilet seat or a handicapped toilet? Yes   TIMED UP AND GO:  Was the test performed? No . Phone Visit  Cognitive Function:        09/25/2022    1:43  PM  6CIT Screen  What Year? 0 points  What month? 0 points  What time? 0 points  Count back from 20 0 points  Months in reverse 0 points  Repeat phrase 0 points  Total Score 0 points    Immunizations Immunization History  Administered Date(s) Administered   COVID-19, mRNA, vaccine(Comirnaty)12 years and older 08/25/2022   Fluad Quad(high Dose 65+) 08/05/2019, 08/05/2020, 08/04/2021, 08/09/2022   Influenza Split 08/29/2011, 08/20/2012   Influenza Whole 10/03/2007,  08/13/2008, 08/18/2009, 09/12/2010   Influenza, High Dose Seasonal PF 08/26/2013, 09/14/2016, 07/26/2017, 08/15/2018   Influenza,inj,Quad PF,6+ Mos 09/08/2014, 08/31/2015   PFIZER Comirnaty(Gray Top)Covid-19 Tri-Sucrose Vaccine 03/23/2021, 08/25/2022   PFIZER(Purple Top)SARS-COV-2 Vaccination 12/28/2019, 01/20/2020, 09/25/2020   Pfizer Covid-19 Vaccine Bivalent Booster 68yr & up 08/16/2021   Pneumococcal Conjugate-13 07/14/2015   Pneumococcal Polysaccharide-23 10/02/2011, 09/02/2013   Tdap 08/20/2012   Zoster Recombinat (Shingrix) 12/11/2017, 02/11/2018   Zoster, Live 12/29/2009    TDAP status: Due, Education has been provided regarding the importance of this vaccine. Advised may receive this vaccine at local pharmacy or Health Dept. Aware to provide a copy of the vaccination record if obtained from local pharmacy or Health Dept. Verbalized acceptance and understanding.  Flu Vaccine status: Up to date  Pneumococcal vaccine status: Up to date  Covid-19 vaccine status: Completed vaccines  Qualifies for Shingles Vaccine? Yes   Zostavax completed Yes   Shingrix Completed?: Yes  Screening Tests Health Maintenance  Topic Date Due   TETANUS/TDAP  08/20/2022   COLONOSCOPY (Pts 45-480yrInsurance coverage will need to be confirmed)  08/10/2023 (Originally 12/15/2020)   COVID-19 Vaccine (6 - Pfizer series) 12/26/2022   Medicare Annual Wellness (AWV)  09/26/2023   Pneumonia Vaccine 6551Years old  Completed   INFLUENZA VACCINE  Completed   Hepatitis C Screening  Completed   Zoster Vaccines- Shingrix  Completed   HPV VACCINES  Aged Out    Health Maintenance  Health Maintenance Due  Topic Date Due   TETANUS/TDAP  08/20/2022    Colorectal cancer screening: Type of screening: Colonoscopy. Completed 12/25/2010. Repeat every 10 years (postponed by PCP)  Lung Cancer Screening: (Low Dose CT Chest recommended if Age 74-80ears, 30 pack-year currently smoking OR have quit w/in 15years.) does  not qualify.   Lung Cancer Screening Referral: no  Additional Screening:  Hepatitis C Screening: does qualify; Completed 07/14/2015  Vision Screening: Recommended annual ophthalmology exams for early detection of glaucoma and other disorders of the eye. Is the patient up to date with their annual eye exam?  Yes  Who is the provider or what is the name of the office in which the patient attends annual eye exams? [facility]. If pt is not established with a provider, would they like to be referred to a provider to establish care? No .   Dental Screening: Recommended annual dental exams for proper oral hygiene  Community Resource Referral / Chronic Care Management: CRR required this visit?  No   CCM required this visit?  No      Plan:     I have personally reviewed and noted the following in the patient's chart:   Medical and social history Use of alcohol, tobacco or illicit drugs  Current medications and supplements including opioid prescriptions. Patient is not currently taking opioid prescriptions. Functional ability and status Nutritional status Physical activity Advanced directives List of other physicians Hospitalizations, surgeries, and ER visits in previous 12 months Vitals Screenings to include cognitive, depression, and falls Referrals  and appointments  In addition, I have reviewed and discussed with patient certain preventive protocols, quality metrics, and best practice recommendations. A written personalized care plan for preventive services as well as general preventive health recommendations were provided to patient.     Sheral Flow, LPN   20/72/1828   Nurse Notes: N/A

## 2022-09-27 ENCOUNTER — Other Ambulatory Visit (HOSPITAL_BASED_OUTPATIENT_CLINIC_OR_DEPARTMENT_OTHER): Payer: Self-pay

## 2022-09-27 ENCOUNTER — Ambulatory Visit (INDEPENDENT_AMBULATORY_CARE_PROVIDER_SITE_OTHER): Payer: PPO

## 2022-09-27 DIAGNOSIS — I442 Atrioventricular block, complete: Secondary | ICD-10-CM | POA: Diagnosis not present

## 2022-09-27 LAB — CUP PACEART REMOTE DEVICE CHECK
Battery Impedance: 2753 Ohm
Battery Remaining Longevity: 24 mo
Battery Voltage: 2.73 V
Brady Statistic AP VP Percent: 4 %
Brady Statistic AP VS Percent: 0 %
Brady Statistic AS VP Percent: 96 %
Brady Statistic AS VS Percent: 0 %
Date Time Interrogation Session: 20231115101257
Implantable Lead Connection Status: 753985
Implantable Lead Connection Status: 753985
Implantable Lead Implant Date: 20120302
Implantable Lead Implant Date: 20120302
Implantable Lead Location: 753859
Implantable Lead Location: 753860
Implantable Lead Model: 5076
Implantable Pulse Generator Implant Date: 20120302
Lead Channel Impedance Value: 524 Ohm
Lead Channel Impedance Value: 664 Ohm
Lead Channel Pacing Threshold Amplitude: 0.5 V
Lead Channel Pacing Threshold Amplitude: 0.625 V
Lead Channel Pacing Threshold Pulse Width: 0.4 ms
Lead Channel Pacing Threshold Pulse Width: 0.4 ms
Lead Channel Setting Pacing Amplitude: 2 V
Lead Channel Setting Pacing Amplitude: 2.5 V
Lead Channel Setting Pacing Pulse Width: 0.4 ms
Lead Channel Setting Sensing Sensitivity: 4 mV
Zone Setting Status: 755011
Zone Setting Status: 755011

## 2022-09-27 MED ORDER — AREXVY 120 MCG/0.5ML IM SUSR
INTRAMUSCULAR | 0 refills | Status: DC
  Filled 2022-09-27: qty 0.5, 1d supply, fill #0

## 2022-09-28 ENCOUNTER — Encounter: Payer: Self-pay | Admitting: Cardiovascular Disease

## 2022-09-28 ENCOUNTER — Ambulatory Visit: Payer: PPO | Attending: Student | Admitting: Cardiovascular Disease

## 2022-09-28 VITALS — BP 110/68 | HR 105 | Ht 77.0 in | Wt 237.2 lb

## 2022-09-28 DIAGNOSIS — D489 Neoplasm of uncertain behavior, unspecified: Secondary | ICD-10-CM | POA: Diagnosis not present

## 2022-09-28 DIAGNOSIS — T50905A Adverse effect of unspecified drugs, medicaments and biological substances, initial encounter: Secondary | ICD-10-CM | POA: Diagnosis not present

## 2022-09-28 DIAGNOSIS — L111 Transient acantholytic dermatosis [Grover]: Secondary | ICD-10-CM | POA: Diagnosis not present

## 2022-09-28 DIAGNOSIS — I442 Atrioventricular block, complete: Secondary | ICD-10-CM | POA: Diagnosis not present

## 2022-09-28 NOTE — Patient Instructions (Signed)
Medication Instructions:  Your physician recommends that you continue on your current medications as directed. Please refer to the Current Medication list given to you today.  *If you need a refill on your cardiac medications before your next appointment, please call your pharmacy*   Follow-Up: At [facility], you and your health needs are our priority.  As part of our continuing mission to provide you with exceptional heart care, we have created designated Provider Care Teams.  These Care Teams include your primary Cardiologist (physician) and Advanced Practice Providers (APPs -  Physician Assistants and Nurse Practitioners) who all work together to provide you with the care you need, when you need it.  Your next appointment:   1 year(s)  The format for your next appointment:   In Person  Provider:   You may see Augustus E Mealor, MD or one of the following Advanced Practice Providers on your designated Care Team:   Renee Ursuy, PA-C Michael "Andy" Tillery, PA-C    Important Information About Sugar       

## 2022-09-28 NOTE — Progress Notes (Signed)
PCP: Binnie Rail, MD   Primary EP:  Dr Maryla Morrow is a 76 y.o. male who presents today for routine electrophysiology followup.  Since last being seen in our clinic, the patient reports doing very well.  Today, he denies symptoms of palpitations, chest pain, shortness of breath,  lower extremity edema, dizziness, presyncope, or syncope.  The patient is otherwise without complaint today.   he has no device related complaints -- no new tenderness, drainage, redness.     Past Medical History:  Diagnosis Date   Complete heart block (HCC)    S/P  PPM by Dr Rayann Heman   Coronary artery disease    Non-obstructive. Pt had left heart catheterization in August 2009 showing a 30% mid-circumflex lesion, otherwise coronaries were clean angiographically   Hyperlipidemia    Hypertension    OSA (obstructive sleep apnea)    Probable; study never scheduled   Past Surgical History:  Procedure Laterality Date   [place], 2009   2009- negative   COLONOSCOPY  2011   negative, [facility]   PACEMAKER INSERTION  01/13/11   MDT by Greggory Brandy for complete heart block    ROS- all systems are reviewed and negative except as per HPI above  Current Outpatient Medications  Medication Sig Dispense Refill   allopurinol (ZYLOPRIM) 300 MG tablet TAKE 1/2 TABLET BY MOUTH EVERY DAY 45 tablet 1   clobetasol (TEMOVATE) 0.05 % cream Apply 1 application topically daily as needed (itching).     fish oil-omega-3 fatty acids 1000 MG capsule Take 1 g by mouth 2 (two) times daily.     fluorouracil (EFUDEX) 5 % cream Apply 1 application topically daily.     gabapentin (NEURONTIN) 300 MG capsule TAKE 1 CAPSULE BY MOUTH EVERYDAY AT BEDTIME 90 capsule 2   losartan (COZAAR) 100 MG tablet TAKE 1 TABLET BY MOUTH EVERY DAY 90 tablet 1   mometasone (ELOCON) 0.1 % lotion SMARTSIG:1 Topical Daily PRN     RSV vaccine recomb adjuvanted (AREXVY) 120 MCG/0.5ML injection Inject into the muscle. 0.5 mL 0    sildenafil (REVATIO) 20 MG tablet TAKE 3 TO 5 TABLETS BY MOUTH AS NEEDED 100 tablet 0   simvastatin (ZOCOR) 40 MG tablet TAKE 1 TABLET BY MOUTH DAILY AT 6 PM. 90 tablet 2   No current facility-administered medications for this visit.    Physical Exam: Vitals:   09/28/22 1419  BP: 110/68  Pulse: (!) 105  SpO2: 95%  Weight: 237 lb 3.2 oz (107.6 kg)  Height: '6\' 5"'$  (1.956 m)    GEN- The patient is well appearing, alert and oriented x 3 today.   Head- normocephalic, atraumatic Lungs- normal work of breathing Chest- pacemaker pocket is well healed Heart- Regular rate and rhythm, no murmurs, rubs or gallops, PMI not laterally displaced GI- soft, NT, ND, + BS Extremities- no clubbing, cyanosis, or edema  Pacemaker interrogation- reviewed in detail today,  See PACEART report  ekg tracing ordered today is personally reviewed and shows AV paced,  V tracking of atrial tachycardia also noted  Assessment and Plan:  1. Symptomatic complete heart block Normal pacemaker function See Pace Art report I decreased the upper rate limit to 120 bpm he is not device dependant today  2. HTN I have instructed him to keep up with BP at home.  If BP is chronically low, we should reduce losartan.  3. [place] Noted on PPM Asymptomatic No changes No afib observed  Return to see EP APP annually  Melida Quitter, MD  09/28/2022 2:28 PM

## 2022-09-29 ENCOUNTER — Ambulatory Visit: Payer: PPO | Admitting: Family Medicine

## 2022-09-29 ENCOUNTER — Ambulatory Visit: Payer: Self-pay

## 2022-09-29 VITALS — Ht 77.0 in | Wt 237.0 lb

## 2022-09-29 DIAGNOSIS — G8929 Other chronic pain: Secondary | ICD-10-CM

## 2022-09-29 DIAGNOSIS — M533 Sacrococcygeal disorders, not elsewhere classified: Secondary | ICD-10-CM | POA: Diagnosis not present

## 2022-09-29 DIAGNOSIS — M7062 Trochanteric bursitis, left hip: Secondary | ICD-10-CM | POA: Diagnosis not present

## 2022-09-29 NOTE — Patient Instructions (Addendum)
Thank you for coming in today.   You received an injection today. Seek immediate medical attention if the joint becomes red, extremely painful, or is oozing fluid.    Work on side leg raises for the left hip pain.  This is trochanteric bursitis.   If not better I can do an injection there in about 2 weeks or so.   Recheck in 3 months. I can repeat the SI injections then.

## 2022-09-29 NOTE — Progress Notes (Signed)
I, [name], LAT, ATC acting as a scribe for [name], MD.  [name] is a 76 y.o. male who presents to [place] at [facility] today for his 46-monthf/u bilat SIJ and bilat low back pain. Pt's last facet joint was performed on December 2022. He was last seen by Dr. CGeorgina Snellon 06/30/22 and was given bilat SI joint steroid injections. Today, pt reports prior steroid injections lasted about 1 month. Pt locates pain to bilat sides of his low back w/ radiating pain into L leg. Pt also has pain on the lateral aspect of the L hip.   Dx imaging: 08/10/21 L-spine CT myelogram             05/11/21 R hip & L-spine XR  Pertinent review of systems: No fevers or chills  Relevant historical information: Hypertension.  Heart disease.   Exam:  Ht '6\' 5"'$  (1.956 m)   Wt 237 lb (107.5 kg)   BMI 28.10 kg/m  General: Well Developed, well nourished, and in no acute distress.   MSK: L-spine nontender midline.  Tender palpation bilateral SI joints. Left hip tender palpation greater trochanter.  Normal hip motion.  Hip abduction strength is diminished    Lab and Radiology Results  Procedure: Real-time Ultrasound Guided Injection of right SI joint Device: Philips Affiniti 50G Images permanently stored and available for review in PACS Verbal informed consent obtained.  Discussed risks and benefits of procedure. Warned about infection, bleeding, hyperglycemia damage to structures among others. Patient expresses understanding and agreement Time-out conducted.   Noted no overlying erythema, induration, or other signs of local infection.   Skin prepped in a sterile fashion.   Local anesthesia: Topical Ethyl chloride.   With sterile technique and under real time ultrasound guidance: 40 mg of Kenalog and 2 mL of Marcaine injected into SI joint. Fluid seen entering the joint capsule.   Completed without difficulty   Pain immediately resolved suggesting accurate placement of the  medication.   Advised to call if fevers/chills, erythema, induration, drainage, or persistent bleeding.   Images permanently stored and available for review in the ultrasound unit.  Impression: Technically successful ultrasound guided injection.    Procedure: Real-time Ultrasound Guided Injection of left SI joint Device: Philips Affiniti 50G Images permanently stored and available for review in PACS Verbal informed consent obtained.  Discussed risks and benefits of procedure. Warned about infection, bleeding, hyperglycemia damage to structures among others. Patient expresses understanding and agreement Time-out conducted.   Noted no overlying erythema, induration, or other signs of local infection.   Skin prepped in a sterile fashion.   Local anesthesia: Topical Ethyl chloride.   With sterile technique and under real time ultrasound guidance: 40 mg of Kenalog and 2 mL of Marcaine injected into SI joint. Fluid seen entering the joint capsule.   Completed without difficulty   Pain immediately resolved suggesting accurate placement of the medication.   Advised to call if fevers/chills, erythema, induration, drainage, or persistent bleeding.   Images permanently stored and available for review in the ultrasound unit.  Impression: Technically successful ultrasound guided injection.        Assessment and Plan: 76y.o. male with BL low back pain due to SI joint dysfunction.  Plan for SI joint injection bilaterally.  This injection does not last the full 3 months but he would rather do that than proceed to block and ablation.   As for the lateral hip pain thought to be  due to trochanteric bursitis. Plan for home exercise hip abduction strength. If not improving I can proceed with injection.    PDMP not reviewed this encounter. Orders Placed This Encounter  Procedures   [place] LIMITED JOINT SPACE STRUCTURES LOW BILAT(NO LINKED CHARGES)    Order Specific Question:   Reason for Exam (SYMPTOM   OR DIAGNOSIS REQUIRED)    Answer:   SI inj    Order Specific Question:   Preferred imaging location?    Answer:   [place]   No orders of the defined types were placed in this encounter.    Discussed warning signs or symptoms. Please see discharge instructions. Patient expresses understanding.   The above documentation has been reviewed and is accurate and complete [name], M.D.

## 2022-10-10 ENCOUNTER — Ambulatory Visit: Payer: PPO | Admitting: Internal Medicine

## 2022-10-10 ENCOUNTER — Encounter: Payer: Self-pay | Admitting: Internal Medicine

## 2022-10-10 VITALS — BP 106/72 | HR 104 | Temp 97.7°F | Ht 77.0 in | Wt 231.2 lb

## 2022-10-10 DIAGNOSIS — R0789 Other chest pain: Secondary | ICD-10-CM | POA: Diagnosis not present

## 2022-10-10 DIAGNOSIS — R058 Other specified cough: Secondary | ICD-10-CM | POA: Insufficient documentation

## 2022-10-10 MED ORDER — METHYLPREDNISOLONE ACETATE 80 MG/ML IJ SUSP
120.0000 mg | Freq: Once | INTRAMUSCULAR | Status: AC
  Administered 2022-10-10: 120 mg via INTRAMUSCULAR

## 2022-10-10 MED ORDER — VALSARTAN 80 MG PO TABS: 80.0000 mg | ORAL_TABLET | Freq: Every day | ORAL | 11 refills | Status: DC

## 2022-10-10 NOTE — Assessment & Plan Note (Addendum)
Onset March 2023 / positional > pleuritic, never with exertion  - 10/10/2022 rec citucel and increase gabapentin to 300 mg bid/ ibs diet and address cough (see separate a/p)   He really has 2 "chest" complaints, neither of which are likely due to a pulmonary problem - the cp started first and is most likely some kind of pinched nerve or IBS as is very localized in LUQ and not reproducible today with coughing but if having the pain, cough makes it worse.  It is not truly pleurtic and certainly not exertional.    See above rx and f/u in 2 weeks to consider CT if not improving on rx

## 2022-10-10 NOTE — Assessment & Plan Note (Addendum)
Onset summer of 2023 s obvious triggers  - 10/10/2022 try off losartan rx with 1st gen H1 blockers per guidelines  and gerd rx otc p depomedrol 120 mg IM   Upper airway cough syndrome (previously labeled PNDS),  is so named because it's frequently impossible to sort out how much is  CR/sinusitis with freq throat clearing (which can be related to primary GERD)   vs  causing  secondary (" extra esophageal")  GERD from wide swings in gastric pressure that occur with throat clearing, often  promoting self use of mint and menthol lozenges that reduce the lower esophageal sphincter tone and exacerbate the problem further in a cyclical fashion.   These are the same pts (now being labeled as having "irritable larynx syndrome" by some cough centers) who not infrequently have a history of having failed to tolerate ace inhibitors(For reasons that may related to vascular permability and nitric oxide pathways but not elevated  bradykinin levels (as seen with  ACEi use) losartan in the generic form has been reported now from mulitple sources  to cause a similar pattern of non-specific  upper airway symptoms as seen with acei.   This has not been reported with exposure to the other ARB's to date, so it seems reasonable for now to try either generic diovan or avapro if ARB needed or use an alternative class altogether.  See:  Lelon Frohlich Allergy Asthma Immunol  2008: 101: p [phone] )  ,  dry powder inhalers or biphosphonates or report having atypical/extraesophageal reflux symptoms that don't respond to standard doses of PPI  and are easily confused as having aecopd or asthma flares by even experienced allergists/ pulmonologists (myself included).   >>>  see above rx and regroup in 2 weeks          Each maintenance medication was reviewed in detail including emphasizing most importantly the difference between maintenance and prns and under what circumstances the prns are to be triggered using an action plan format where  appropriate.  Total time for H and P, chart review, counseling,  and generating customized AVS unique to this office visit / same day charting = 45 min new pt with 2 separate refractory symptoms of unknown origin

## 2022-10-10 NOTE — Patient Instructions (Addendum)
Classic subdiaphragmatic pain pattern suggests ibs = Splenic flex very limited distribution of pain locations, daytime, not usually exacerbated by exercise, worse in sitting position, frequently associated with generalized abd bloating, not as likely to be present in certain positions due to  dome effect of the diaphragm which  is  canceled in that position. Frequently these patients have had multiple negative GI workups and CT scans.  Treatment consists of avoiding foods that cause gas (especially boiled eggs, mexcican food but especially  beans and undercooked vegetables like  spinach and some salads)  and citrucel 1 heaping tsp twice daily with a large glass of water.  Pain should improve w/in 2 weeks and if not then consider CT chest next.  Increase gabapentin to 300 mg twice daily   Stop losartan and start valsartan 80 mg one daily - take one half daily if light headed standing   For drainage / throat tickle try take CHLORPHENIRAMINE  4 mg  ("Allergy Relief" '4mg'$   at [facility] should be easiest to find in the blue box usually on bottom shelf)  take one every 4 hours as needed - extremely effective and inexpensive over the counter- may cause drowsiness so start with just a dose or two an hour before bedtime and see how you tolerate it before trying in daytime.   Try prilosec otc '20mg'$   Take 30-60 min before first meal of the day and Pepcid ac (famotidine) 20 mg one after supper until cough is completely gone for at least a week without the need for cough suppression  No mint menthol or chocolate products and stop fish oil while coughing   Depomedrol 120 mg IM today    Please schedule a follow up office visit in 2 weeks, sooner if needed

## 2022-10-10 NOTE — Progress Notes (Signed)
[name], male    DOB: 1946/04/25   MRN: [id number]   Brief patient profile:  39 yowm  quit smoking 2007  referred to pulmonary clinic 10/10/2022 by Billey Gosling  for positional fleeting  L cp  happens maybe  6 x daily since March 2023  while on gabapentin 300 mg aggravated by coughing (sometimes, sometimes not) since summery 2023     History of Present Illness  10/10/2022  Pulmonary/ 1st office eval/Annikah Lovins  Chief Complaint  Patient presents with   Consult    Pain in L rib cage and cough x 9 months.  Sx worse when laying down  Dyspnea:  was doing 3 miles a day before covid but very inactive due to back pain s wt gain since quantine  Cough: onset was summer of 2025 variable 24/7  Sleep: sometimes coughs in sleep and that brings on the pain  but otherwise pain does not wake him up  SABA use: none  Cp localized to LUQ anteriorly   comes and goes worse lying down in certain positions but no repoducible  pattern   No obvious patterns in day to day or daytime variability or assoc excess/ purulent sputum or mucus plugs or hemoptysis or cp or chest tightness, subjective wheeze or overt   hb symptoms.    Also denies any obvious fluctuation of symptoms with weather or environmental changes or other aggravating or alleviating factors except as outlined above   No unusual exposure hx or h/o childhood pna/ asthma or knowledge of premature birth.  Current Allergies, Complete Past Medical History, Past Surgical History, Family History, and Social History were reviewed in [employer] record.  ROS  The following are not active complaints unless bolded Hoarseness, sore throat, dysphagia, dental problems, itching, sneezing,  nasal congestion or discharge of excess mucus or purulent secretions, ear ache,   fever, chills, sweats, unintended wt loss or wt gain, classically pleuritic or exertional cp,  orthopnea pnd or arm/hand swelling  or leg swelling, presyncope, palpitations,  abdominal pain, anorexia, nausea, vomiting, diarrhea  or change in bowel habits or change in bladder habits, change in stools or change in urine, dysuria, hematuria,  rash, arthralgias, visual complaints, headache, numbness, weakness or ataxia or problems with walking or coordination,  change in mood or  memory.           Past Medical History:  Diagnosis Date   Complete heart block (HCC)    S/P  PPM by Dr Rayann Heman   Coronary artery disease    Non-obstructive. Pt had left heart catheterization in August 2009 showing a 30% mid-circumflex lesion, otherwise coronaries were clean angiographically   Hyperlipidemia    Hypertension    OSA (obstructive sleep apnea)    Probable; study never scheduled    Outpatient Medications Prior to Visit  Medication Sig Dispense Refill   allopurinol (ZYLOPRIM) 300 MG tablet TAKE 1/2 TABLET BY MOUTH EVERY DAY 45 tablet 1   clobetasol (TEMOVATE) 0.05 % cream Apply 1 application topically daily as needed (itching).     fish oil-omega-3 fatty acids 1000 MG capsule Take 1 g by mouth 2 (two) times daily.     gabapentin (NEURONTIN) 300 MG capsule TAKE 1 CAPSULE BY MOUTH EVERYDAY AT BEDTIME 90 capsule 2   losartan (COZAAR) 100 MG tablet TAKE 1 TABLET BY MOUTH EVERY DAY 90 tablet 1   mometasone (ELOCON) 0.1 % lotion SMARTSIG:1 Topical Daily PRN     sildenafil (REVATIO) 20 MG tablet  TAKE 3 TO 5 TABLETS BY MOUTH AS NEEDED 100 tablet 0   simvastatin (ZOCOR) 40 MG tablet TAKE 1 TABLET BY MOUTH DAILY AT 6 PM. 90 tablet 2   fluorouracil (EFUDEX) 5 % cream Apply 1 application topically daily. (Patient not taking: Reported on 10/10/2022)     RSV vaccine recomb adjuvanted (AREXVY) 120 MCG/0.5ML injection Inject into the muscle. (Patient not taking: Reported on 10/10/2022) 0.5 mL 0   No facility-administered medications prior to visit.     Objective:     BP 106/72 (BP Location: Left Arm, Patient Position: Sitting, Cuff Size: Large)   Pulse (!) 104   Temp 97.7 F (36.5 C)  (Oral)   Ht '6\' 5"'$  (1.956 m)   Wt 231 lb 3.2 oz (104.9 kg)   SpO2 100%   BMI 27.42 kg/m   SpO2: 100 %  Amb stoic wm nad    HEENT : Oropharynx  clear      Nasal turbinates nl    NECK :  without  apparent JVD/ palpable Nodes/TM    LUNGS: no acc muscle use,  Nl contour chest which is clear to A and P bilaterally without cough on insp or exp maneuvers   CV:  RRR  no s3 or murmur or increase in P2, and no edema   ABD:  soft and nontender with nl inspiratory excursion in the supine position. No bruits or organomegaly appreciated   MS:  Nl gait/ ext warm without deformities Or obvious joint restrictions  calf tenderness, cyanosis or clubbing    SKIN: warm and dry without lesions    NEURO:  alert, approp, nl sensorium with  no motor or cerebellar deficits apparent.    I personally reviewed images and agree with radiology impression as follows:  CXR:   pa and lateral  08/09/22 wnl     Assessment   Chest pain, atypical Onset March 2023 / positional > pleuritic, never with exertion  - 10/10/2022 rec citucel and increase gabapentin to 300 mg bid/ ibs diet and address cough (see separate a/p)   He really has 2 "chest" complaints, neither of which are likely due to a pulmonary problem - the cp started first and is most likely some kind of pinched nerve or IBS as is very localized in LUQ and not reproducible today with coughing but if having the pain, cough makes it worse.  It is not truly pleurtic and certainly not exertional.    See above rx and f/u in 2 weeks to consider CT if not improving on rx   Upper airway cough syndrome Onset summer of 2023 s obvious triggers  - 10/10/2022 try off losartan rx with 1st gen H1 blockers per guidelines  and gerd rx otc p depomedrol 120 mg IM   Upper airway cough syndrome (previously labeled PNDS),  is so named because it's frequently impossible to sort out how much is  CR/sinusitis with freq throat clearing (which can be related to primary GERD)    vs  causing  secondary (" extra esophageal")  GERD from wide swings in gastric pressure that occur with throat clearing, often  promoting self use of mint and menthol lozenges that reduce the lower esophageal sphincter tone and exacerbate the problem further in a cyclical fashion.   These are the same pts (now being labeled as having "irritable larynx syndrome" by some cough centers) who not infrequently have a history of having failed to tolerate ace inhibitors(For reasons that may related to vascular permability  and nitric oxide pathways but not elevated  bradykinin levels (as seen with  ACEi use) losartan in the generic form has been reported now from mulitple sources  to cause a similar pattern of non-specific  upper airway symptoms as seen with acei.   This has not been reported with exposure to the other ARB's to date, so it seems reasonable for now to try either generic diovan or avapro if ARB needed or use an alternative class altogether.  See:  Lelon Frohlich Allergy Asthma Immunol  2008: 101: p [phone] )  ,  dry powder inhalers or biphosphonates or report having atypical/extraesophageal reflux symptoms that don't respond to standard doses of PPI  and are easily confused as having aecopd or asthma flares by even experienced allergists/ pulmonologists (myself included).   >>>  see above rx and regroup in 2 weeks          Each maintenance medication was reviewed in detail including emphasizing most importantly the difference between maintenance and prns and under what circumstances the prns are to be triggered using an action plan format where appropriate.  Total time for H and P, chart review, counseling,  and generating customized AVS unique to this office visit / same day charting = 45 min new pt with 2 separate refractory symptoms of unknown origin           Christinia Gully, MD 10/10/2022

## 2022-10-10 NOTE — Addendum Note (Signed)
Addended byOralia Rud M on: 10/10/2022 05:12 PM   Modules accepted: Orders

## 2022-10-17 DIAGNOSIS — L578 Other skin changes due to chronic exposure to nonionizing radiation: Secondary | ICD-10-CM | POA: Diagnosis not present

## 2022-10-17 DIAGNOSIS — D489 Neoplasm of uncertain behavior, unspecified: Secondary | ICD-10-CM | POA: Diagnosis not present

## 2022-10-17 DIAGNOSIS — L814 Other melanin hyperpigmentation: Secondary | ICD-10-CM | POA: Diagnosis not present

## 2022-10-17 DIAGNOSIS — L821 Other seborrheic keratosis: Secondary | ICD-10-CM | POA: Diagnosis not present

## 2022-10-17 DIAGNOSIS — D225 Melanocytic nevi of trunk: Secondary | ICD-10-CM | POA: Diagnosis not present

## 2022-10-17 DIAGNOSIS — Z85828 Personal history of other malignant neoplasm of skin: Secondary | ICD-10-CM | POA: Diagnosis not present

## 2022-10-17 DIAGNOSIS — L570 Actinic keratosis: Secondary | ICD-10-CM | POA: Diagnosis not present

## 2022-10-18 ENCOUNTER — Encounter: Payer: PPO | Admitting: Student

## 2022-10-19 NOTE — Progress Notes (Signed)
Remote pacemaker transmission.   

## 2022-11-23 ENCOUNTER — Ambulatory Visit: Payer: PPO | Admitting: Internal Medicine

## 2022-11-29 DIAGNOSIS — L570 Actinic keratosis: Secondary | ICD-10-CM | POA: Diagnosis not present

## 2022-12-07 ENCOUNTER — Ambulatory Visit (INDEPENDENT_AMBULATORY_CARE_PROVIDER_SITE_OTHER): Payer: PPO

## 2022-12-07 ENCOUNTER — Ambulatory Visit: Payer: PPO | Admitting: Internal Medicine

## 2022-12-07 ENCOUNTER — Encounter: Payer: Self-pay | Admitting: Internal Medicine

## 2022-12-07 VITALS — BP 128/80 | HR 83 | Temp 98.5°F | Ht 77.0 in | Wt 231.2 lb

## 2022-12-07 DIAGNOSIS — R059 Cough, unspecified: Secondary | ICD-10-CM | POA: Diagnosis not present

## 2022-12-07 DIAGNOSIS — R0789 Other chest pain: Secondary | ICD-10-CM | POA: Diagnosis not present

## 2022-12-07 DIAGNOSIS — R058 Other specified cough: Secondary | ICD-10-CM

## 2022-12-07 DIAGNOSIS — R1012 Left upper quadrant pain: Secondary | ICD-10-CM | POA: Diagnosis not present

## 2022-12-07 DIAGNOSIS — R079 Chest pain, unspecified: Secondary | ICD-10-CM | POA: Diagnosis not present

## 2022-12-07 LAB — CBC WITH DIFFERENTIAL/PLATELET
Basophils Absolute: 0 10*3/uL (ref 0.0–0.1)
Basophils Relative: 0.4 % (ref 0.0–3.0)
Eosinophils Absolute: 0.2 10*3/uL (ref 0.0–0.7)
Eosinophils Relative: 3.2 % (ref 0.0–5.0)
HCT: 47.3 % (ref 39.0–52.0)
Hemoglobin: 16 g/dL (ref 13.0–17.0)
Lymphocytes Relative: 35.7 % (ref 12.0–46.0)
Lymphs Abs: 1.9 10*3/uL (ref 0.7–4.0)
MCHC: 33.8 g/dL (ref 30.0–36.0)
MCV: 105.9 fl — ABNORMAL HIGH (ref 78.0–100.0)
Monocytes Absolute: 0.5 10*3/uL (ref 0.1–1.0)
Monocytes Relative: 9 % (ref 3.0–12.0)
Neutro Abs: 2.8 10*3/uL (ref 1.4–7.7)
Neutrophils Relative %: 51.7 % (ref 43.0–77.0)
Platelets: 184 10*3/uL (ref 150.0–400.0)
RBC: 4.47 Mil/uL (ref 4.22–5.81)
RDW: 14.8 % (ref 11.5–15.5)
WBC: 5.4 10*3/uL (ref 4.0–10.5)

## 2022-12-07 LAB — SEDIMENTATION RATE: Sed Rate: 13 mm/hr (ref 0–20)

## 2022-12-07 MED ORDER — PANTOPRAZOLE SODIUM 40 MG PO TBEC: 40.0000 mg | DELAYED_RELEASE_TABLET | Freq: Every day | ORAL | 2 refills | Status: DC

## 2022-12-07 MED ORDER — GABAPENTIN 300 MG PO CAPS: 300.0000 mg | ORAL_CAPSULE | Freq: Three times a day (TID) | ORAL | 2 refills | Status: DC

## 2022-12-07 MED ORDER — FAMOTIDINE 20 MG PO TABS: ORAL_TABLET | ORAL | 11 refills | Status: DC

## 2022-12-07 NOTE — Patient Instructions (Addendum)
Treatment consists of avoiding foods that cause gas (especially undercooked vegetables like  spinach and some salads)  and Citrucel(white with orange top) 1 heaping tsp twice daily with a large glass of water.    Increase gabapentin to 300 mg three x  daily   Continue valsartan 80 mg one half  daily    For drainage / throat tickle try take CHLORPHENIRAMINE  4 mg  ("Allergy Relief" '4mg'$   at [facility] should be easiest to find in the blue box usually on bottom shelf)  take one every 4 hours as needed - extremely effective and inexpensive over the counter- may cause drowsiness so start with just a dose or two an hour before bedtime and see how you tolerate it before trying in daytime.   Pantoprazole (protonix) 40 mg   Take  30-60 min before first meal of the day and Pepcid (famotidine)  20 mg an hour before  until return to office - this is the best way to tell whether stomach acid is contributing to your problem.   GERD (REFLUX)  is an extremely common cause of respiratory symptoms just like yours , many times with no obvious heartburn at all.    It can be treated with medication, but also with lifestyle changes including elevation of the head of your bed (ideally with 6 -8inch blocks under the headboard of your bed),  Smoking cessation, avoidance of late meals, excessive alcohol, and avoid fatty foods, chocolate, peppermint, colas, red wine, and acidic juices such as orange juice.  NO MINT OR MENTHOL PRODUCTS SO NO COUGH DROPS  USE SUGARLESS CANDY INSTEAD (Jolley ranchers or Stover's or Life Savers) or even ice chips will also do - the key is to swallow to prevent all throat clearing. NO OIL BASED VITAMINS - use powdered substitutes.  Avoid fish oil when coughing.    Please remember to go to the lab and x-ray department  for your tests - we will call you with the results when they are available.      Please schedule a follow up office visit in 2 weeks, call sooner if needed with all  medications /inhalers/ solutions in hand so we can verify exactly what you are taking. This includes all medications from all doctors and over the [place] separate them into two bags:  the ones you take automatically, no matter what, vs the ones you take just when you feel you need them "BAG #2 is UP TO YOU"  - this will really help [place] help you take your medications more effectively.

## 2022-12-07 NOTE — Progress Notes (Signed)
[name], male    DOB: 08/23/1946   MRN: [id number]   Brief patient profile:  33 yowm  quit smoking 2007  referred to pulmonary clinic 10/10/2022 by Billey Gosling  for positional fleeting  L cp  happens maybe  6 x daily since March 2023  while on gabapentin 300 mg aggravated by coughing (sometimes, sometimes not) since summery 2023   L rib pain onset was 04/2020 > neg CT chest 06/18/20   History of Present Illness  10/10/2022  Pulmonary/ 1st office eval/Wanita Derenzo  Chief Complaint  Patient presents with   Consult    Pain in L rib cage and cough x 9 months.  Sx worse when laying down  Dyspnea:  was doing 3 miles a day before covid but very inactive due to back pain s wt gain since quantine  Cough: onset was summer of 2025 variable 24/7  Sleep: sometimes coughs in sleep and that brings on the pain  but otherwise pain does not wake him up  SABA use: none  Cp localized to LUQ anteriorly   comes and goes worse lying down in certain positions but no repoducible  pattern  Rec pain pattern suggests ibs   Treatment consists of avoiding foods that cause gas    and citrucel 1 heaping tsp twice daily  Increase gabapentin to 300 mg twice daily  Stop losartan and start valsartan 80 mg one daily - take one half daily if light headed standing  For drainage / throat tickle try take CHLORPHENIRAMINE  4 mg   Try prilosec otc '20mg'$   Take 30-60 min before first meal of the day and Pepcid ac (famotidine) 20 mg one after supper until cough is completely gone for at least a week No mint menthol or chocolate products and stop fish oil while coughing  Depomedrol 120 mg IM today       12/07/2022  f/u ov/[name] re: chronic cough / L CP  did not follow any of the above instructions  Chief Complaint  Patient presents with   Follow-up    Valsartan made pt dizzy.  Cut dose to half tablet daily.  Dry cough and runny nose persistent.  Pain in left side worse when laying down and coughing at night   Dyspnea:  limited  by back  Cough: worse p stirs around/ also some noct > dry  Sleeping: bed flat/ no pillows under head (did not elevate on blocks as rec)  SABA use: none  02: none Runny nose during the day   No change postional L flank pain worse with cough also      No obvious day to day or daytime variability or assoc excess/ purulent sputum or mucus plugs or hemoptysis or  chest tightness, subjective wheeze or overt sinus or hb symptoms.    Also denies any obvious fluctuation of symptoms with weather or environmental changes or other aggravating or alleviating factors except as outlined above   No unusual exposure hx or h/o childhood pna/ asthma or knowledge of premature birth.  Current Allergies, Complete Past Medical History, Past Surgical History, Family History, and Social History were reviewed in [employer] record.  ROS  The following are not active complaints unless bolded Hoarseness, sore throat, dysphagia, dental problems, itching, sneezing,  nasal congestion or discharge of excess mucus or purulent secretions, ear ache,   fever, chills, sweats, unintended wt loss or wt gain, classically pleuritic or exertional cp,  orthopnea pnd or arm/hand swelling  or leg swelling, presyncope, palpitations, abdominal pain, anorexia, nausea, vomiting, diarrhea  or change in bowel habits or change in bladder habits, change in stools or change in urine, dysuria, hematuria,  rash, arthralgias, visual complaints, headache, numbness, weakness or ataxia or problems with walking or coordination,  change in mood or  memory.        Current Meds  Medication Sig   allopurinol (ZYLOPRIM) 300 MG tablet TAKE 1/2 TABLET BY MOUTH EVERY DAY   gabapentin (NEURONTIN) 300 MG capsule TAKE 1 CAPSULE BY MOUTH EVERYDAY AT BEDTIME (Patient taking differently: Take 300 mg by mouth 2 (two) times daily.)   sildenafil (REVATIO) 20 MG tablet TAKE 3 TO 5 TABLETS BY MOUTH AS NEEDED   simvastatin (ZOCOR) 40 MG tablet  TAKE 1 TABLET BY MOUTH DAILY AT 6 PM.   valsartan (DIOVAN) 80 MG tablet Take 1 tablet (80 mg total) by mouth daily. (Patient taking differently: Take 40 mg by mouth daily. Patient cuts 80 mg tablet in half)            Past Medical History:  Diagnosis Date   Complete heart block (HCC)    S/P  PPM by Dr Rayann Heman   Coronary artery disease    Non-obstructive. Pt had left heart catheterization in August 2009 showing a 30% mid-circumflex lesion, otherwise coronaries were clean angiographically   Hyperlipidemia    Hypertension    OSA (obstructive sleep apnea)    Probable; study never scheduled       Objective:     Wt Readings from Last 3 Encounters:  12/07/22 231 lb 3.2 oz (104.9 kg)  10/10/22 231 lb 3.2 oz (104.9 kg)  09/29/22 237 lb (107.5 kg)      Vital signs reviewed  12/07/2022  - Note at rest 02 sats  96% on RA   General appearance:    amb somber mod obese wm nad   HEENT : Oropharynx  clear      Nasal turbinates nl    NECK :  without  apparent JVD/ palpable Nodes/TM    LUNGS: no acc muscle use,  Nl contour chest which is clear to A and P bilaterally without cough on insp or exp maneuvers   CV:  RRR  no s3 or murmur or increase in P2, and no edema   ABD:  soft and nontender with nl inspiratory excursion in the supine position. No bruits or organomegaly appreciated   MS:  Nl gait/ ext warm without deformities Or obvious joint restrictions  calf tenderness, cyanosis or clubbing    SKIN: warm and dry without lesions    NEURO:  alert, approp, nl sensorium with  no motor or cerebellar deficits apparent.       CXR PA and Lateral:   12/07/2022 :    I personally reviewed images and impression is as follows:     No effusion, infiltrate or rib fx seen      Assessment

## 2022-12-08 LAB — IGE: IgE (Immunoglobulin E), Serum: 203 kU/L — ABNORMAL HIGH (ref <=114)

## 2022-12-08 NOTE — Assessment & Plan Note (Addendum)
Onset summer of 2023 s obvious triggers  - 10/10/2022 try rx with 1st gen H1 blockers per guidelines  and gerd rx otc p depomedrol 120 mg IM  - 12/07/2022 repeat above as did not follow instructions  - Allergy screen 12/07/2022 >  Eos 0.2 /  IgE  pending   Advised: The standardized cough guidelines published in Chest by Lissa Morales in 2006 are still the best available and consist of a multiple step process (up to 12!) , not a single office visit,  and are intended  to address this problem logically,  with an alogrithm dependent on response to empiric treatment at  each progressive step  to determine a specific diagnosis with  minimal addtional testing needed. Therefore if adherence is an issue or can't be accurately verified,  it's very unlikely the standard evaluation and treatment will be successful here.    Furthermore, response to therapy (other than acute cough suppression, which should only be used short term with avoidance of narcotic containing cough syrups if possible), can be a gradual process for which the patient is not likely to  perceive immediate benefit.  Unlike going to an eye doctor where the best perscription is almost always the first one and is immediately effective, this is almost never the case in the management of chronic cough syndromes. Therefore the patient needs to commit up front to consistently adhere to recommendations  for up to 6 weeks of therapy directed at the likely underlying problem(s) before the response can be reasonably evaluated.   F/u 2 weeks with all meds in hand using a trust but verify approach to confirm accurate Medication  Reconciliation The principal here is that until we are certain that the  patients are doing what we've asked, it makes no sense to ask them to do more.          Each maintenance medication was reviewed in detail including emphasizing most importantly the difference between maintenance and prns and under what circumstances the prns  are to be triggered using an action plan format where appropriate.  Total time for H and P, chart review, counseling,   and generating customized AVS unique to this office visit / same day charting  = 30 min for multiple  refractory respiratory  symptoms of uncertain etiology

## 2022-12-08 NOTE — Assessment & Plan Note (Addendum)
Onset March 2023 / positional > pleuritic, never with exertion  - 10/10/2022 rec citucel and increase gabapentin to 300 mg bid/ ibs diet and address cough (see separate a/p) > did not do as of 12/07/2022  - 12/07/2022 rec repeat same instructions/ increase gabapentin to 300 mg tid  - 12/07/2022  ESR = 0.2/ cxr nl

## 2022-12-11 ENCOUNTER — Telehealth: Payer: Self-pay | Admitting: Internal Medicine

## 2022-12-11 DIAGNOSIS — R053 Chronic cough: Secondary | ICD-10-CM

## 2022-12-11 DIAGNOSIS — R058 Other specified cough: Secondary | ICD-10-CM

## 2022-12-11 NOTE — Progress Notes (Signed)
Pt notified of results and was agreeable to have the HRCT and I have placed order for this.

## 2022-12-11 NOTE — Progress Notes (Signed)
Spoke with pt and notified of results per Dr. Melvyn Novas. Pt verbalized understanding. He wants to understand why hi MCV is elevated. Dr Melvyn Novas, can you please advise, thanks!

## 2022-12-15 ENCOUNTER — Ambulatory Visit (HOSPITAL_BASED_OUTPATIENT_CLINIC_OR_DEPARTMENT_OTHER)
Admission: RE | Admit: 2022-12-15 | Discharge: 2022-12-15 | Disposition: A | Discharge status: Home / Self Care | Payer: PPO | Source: Ambulatory Visit | Attending: Internal Medicine | Admitting: Internal Medicine

## 2022-12-15 ENCOUNTER — Other Ambulatory Visit (HOSPITAL_BASED_OUTPATIENT_CLINIC_OR_DEPARTMENT_OTHER): Payer: Self-pay

## 2022-12-15 DIAGNOSIS — R911 Solitary pulmonary nodule: Secondary | ICD-10-CM | POA: Insufficient documentation

## 2022-12-15 DIAGNOSIS — I2510 Atherosclerotic heart disease of native coronary artery without angina pectoris: Secondary | ICD-10-CM | POA: Insufficient documentation

## 2022-12-15 DIAGNOSIS — R053 Chronic cough: Secondary | ICD-10-CM | POA: Insufficient documentation

## 2022-12-15 DIAGNOSIS — I700 Atherosclerosis of aorta: Secondary | ICD-10-CM | POA: Diagnosis not present

## 2022-12-15 DIAGNOSIS — K760 Fatty (change of) liver, not elsewhere classified: Secondary | ICD-10-CM | POA: Diagnosis not present

## 2022-12-15 MED ORDER — BOOSTRIX 5-2.5-18.5 LF-MCG/0.5 IM SUSY
PREFILLED_SYRINGE | INTRAMUSCULAR | 0 refills | Status: DC
  Filled 2022-12-15: qty 0.5, 1d supply, fill #0

## 2022-12-15 NOTE — Progress Notes (Signed)
Spoke with pt and notified of response per Dr. Melvyn Novas. Pt verbalized understanding and denied any questions.

## 2022-12-16 ENCOUNTER — Other Ambulatory Visit (HOSPITAL_BASED_OUTPATIENT_CLINIC_OR_DEPARTMENT_OTHER): Payer: Self-pay

## 2022-12-16 ENCOUNTER — Encounter: Payer: Self-pay | Admitting: Internal Medicine

## 2022-12-20 ENCOUNTER — Other Ambulatory Visit (HOSPITAL_BASED_OUTPATIENT_CLINIC_OR_DEPARTMENT_OTHER): Payer: Self-pay

## 2022-12-20 NOTE — Progress Notes (Signed)
ATC x1.  No answer.  No vm.

## 2022-12-21 ENCOUNTER — Encounter: Payer: Self-pay | Admitting: Adult Health

## 2022-12-21 ENCOUNTER — Ambulatory Visit: Payer: PPO | Admitting: Adult Health

## 2022-12-21 VITALS — BP 130/70 | HR 92 | Temp 97.8°F | Ht 72.0 in | Wt 236.6 lb

## 2022-12-21 DIAGNOSIS — R058 Other specified cough: Secondary | ICD-10-CM

## 2022-12-21 DIAGNOSIS — R0781 Pleurodynia: Secondary | ICD-10-CM

## 2022-12-21 DIAGNOSIS — R0609 Other forms of dyspnea: Secondary | ICD-10-CM | POA: Diagnosis not present

## 2022-12-21 DIAGNOSIS — R911 Solitary pulmonary nodule: Secondary | ICD-10-CM

## 2022-12-21 MED ORDER — STIOLTO RESPIMAT 2.5-2.5 MCG/ACT IN AERS: 2.0000 | INHALATION_SPRAY | Freq: Every day | RESPIRATORY_TRACT | 5 refills | Status: DC

## 2022-12-21 MED ORDER — PREDNISONE 20 MG PO TABS: 20.0000 mg | ORAL_TABLET | Freq: Every day | ORAL | 0 refills | Status: DC

## 2022-12-21 MED ORDER — ALBUTEROL SULFATE HFA 108 (90 BASE) MCG/ACT IN AERS: 1.0000 | INHALATION_SPRAY | Freq: Four times a day (QID) | RESPIRATORY_TRACT | 2 refills | Status: DC | PRN

## 2022-12-21 MED ORDER — STIOLTO RESPIMAT 2.5-2.5 MCG/ACT IN AERS: 2.0000 | INHALATION_SPRAY | Freq: Every day | RESPIRATORY_TRACT | 0 refills | Status: DC

## 2022-12-21 MED ORDER — BENZONATATE 200 MG PO CAPS: 200.0000 mg | ORAL_CAPSULE | Freq: Three times a day (TID) | ORAL | 1 refills | Status: DC | PRN

## 2022-12-21 NOTE — Progress Notes (Signed)
$@Patientf$  ID: [name], male    DOB: 03-08-1946, 77 y.o.   MRN: [id number]  Chief Complaint  Patient presents with   Follow-up    Referring provider: Binnie Rail, MD  HPI: 77 year old male former smoker seen for pulmonary consult November 2023 for chronic cough for 9 months.  TEST/EVENTS :   12/21/2022 Follow up : Chronic cough  Patient returns for a 2-week follow-up.  Patient was seen last visit with ongoing chronic cough that has not been improving.  Complains of a dry minimally productive cough that has been going on for over 9 months.  Also has pleuritic pain under his left ribs that comes and go and can be severe at times.  Patient says it hurts to take a deep breath and sometimes.  Last visit patient was recommended to increase gabapentin 300 mg 3 times daily.  Also to use chlor tabs.  Patient says the gabapentin has made no difference in his cough.  But is making him very sleepy.  Patient says he did not get the chlor tabs.  His blood pressure medicine also was changed from losartan to valsartan.  He also has been placed on aggressive GERD treatment with Prilosec and Pepcid.  Patient says all these measures have made no difference in his cough or pleuritic pain. High-resolution CT chest done on December 15, 2022 showed very subtle subpleural reticulations with no clear craniocaudal predominance.  Findings are felt to be indeterminate for UIP.  We discussed a CT scan results in detail. Patient says he does get some shortness of breath with activities.  Is very frustrated with this ongoing cough.  Does have some postnasal drip.  Feels that he has a tickle in his throat.  Patient does have a heavy smoking history with a 60-pack-year history  No Known Allergies  Immunization History  Administered Date(s) Administered   COVID-19, mRNA, vaccine(Comirnaty)12 years and older 08/25/2022   Fluad Quad(high Dose 65+) 08/05/2019, 08/05/2020, 08/04/2021, 08/09/2022   Influenza Split  08/29/2011, 08/20/2012   Influenza Whole 10/03/2007, 08/13/2008, 08/18/2009, 09/12/2010   Influenza, High Dose Seasonal PF 08/26/2013, 09/14/2016, 07/26/2017, 08/15/2018   Influenza,inj,Quad PF,6+ Mos 09/08/2014, 08/31/2015   PFIZER Comirnaty(Gray Top)Covid-19 Tri-Sucrose Vaccine 03/23/2021, 08/25/2022   PFIZER(Purple Top)SARS-COV-2 Vaccination 12/28/2019, 01/20/2020, 09/25/2020   Pfizer Covid-19 Vaccine Bivalent Booster 24yr & up 08/16/2021   Pneumococcal Conjugate-13 07/14/2015   Pneumococcal Polysaccharide-23 10/02/2011, 09/02/2013   Respiratory Syncytial Virus Vaccine,Recomb Aduvanted(Arexvy) 09/27/2022   Tdap 08/20/2012, 12/15/2022   Zoster Recombinat (Shingrix) 12/11/2017, 02/11/2018   Zoster, Live 12/29/2009    Past Medical History:  Diagnosis Date   Complete heart block (HCC)    S/P  PPM by Dr ARayann Heman  Coronary artery disease    Non-obstructive. Pt had left heart catheterization in August 2009 showing a 30% mid-circumflex lesion, otherwise coronaries were clean angiographically   Hyperlipidemia    Hypertension    OSA (obstructive sleep apnea)    Probable; study never scheduled    Tobacco History: Social History   Tobacco Use  Smoking Status Former   Packs/day: 2.00   Years: 30.00   Total pack years: 60.00   Types: Cigarettes   Quit date: 11/14/2003   Years since quitting: 19.1  Smokeless Tobacco Never  Tobacco Comments   smoked 1965- 2007, up to 2 ppd   Counseling given: Not Answered Tobacco comments: smoked 1965- 2007, up to 2 ppd   Outpatient Medications Prior to Visit  Medication Sig Dispense Refill   allopurinol (ZYLOPRIM)  300 MG tablet TAKE 1/2 TABLET BY MOUTH EVERY DAY 45 tablet 1   famotidine (PEPCID) 20 MG tablet One after supper 30 tablet 11   gabapentin (NEURONTIN) 300 MG capsule Take 1 capsule (300 mg total) by mouth 3 (three) times daily. 90 capsule 2   pantoprazole (PROTONIX) 40 MG tablet Take 1 tablet (40 mg total) by mouth daily. Take 30-60 min  before first meal of the day 30 tablet 2   sildenafil (REVATIO) 20 MG tablet TAKE 3 TO 5 TABLETS BY MOUTH AS NEEDED 100 tablet 0   simvastatin (ZOCOR) 40 MG tablet TAKE 1 TABLET BY MOUTH DAILY AT 6 PM. 90 tablet 2   Tdap (BOOSTRIX) 5-2.5-18.5 LF-MCG/0.5 injection Inject into the muscle. 0.5 mL 0   valsartan (DIOVAN) 80 MG tablet Take 1 tablet (80 mg total) by mouth daily. (Patient taking differently: Take 40 mg by mouth daily. Patient cuts 80 mg tablet in half  Takes 40 mg) 30 tablet 11   No facility-administered medications prior to visit.     Review of Systems:   Constitutional:   No  weight loss, night sweats,  Fevers, chills, fatigue, or  lassitude.  HEENT:   No headaches,  Difficulty swallowing,  Tooth/dental problems, or  Sore throat,                No sneezing, itching, ear ache,  +nasal congestion, post nasal drip,   CV:  No chest pain,  Orthopnea, PND, swelling in lower extremities, anasarca, dizziness, palpitations, syncope.   GI  No heartburn, indigestion, abdominal pain, nausea, vomiting, diarrhea, change in bowel habits, loss of appetite, bloody stools.   Resp:   No chest wall deformity  Skin: no rash or lesions.  GU: no dysuria, change in color of urine, no urgency or frequency.  No flank pain, no hematuria   MS:  No joint pain or swelling.  No decreased range of motion.  No back pain.    Physical Exam  BP 130/70 (BP Location: Left Arm, Patient Position: Sitting, Cuff Size: Normal)   Pulse 92   Temp 97.8 F (36.6 C) (Oral)   Ht 6' (1.829 m)   Wt 236 lb 9.6 oz (107.3 kg)   SpO2 96%   BMI 32.09 kg/m   GEN: A/Ox3; pleasant , NAD, well nourished    HEENT:  [place]/AT,  NOSE-clear, THROAT-clear, no lesions, no postnasal drip or exudate noted.   NECK:  Supple w/ fair ROM; no JVD; normal carotid impulses w/o bruits; no thyromegaly or nodules palpated; no lymphadenopathy.    RESP  Clear  P & A; w/o, wheezes/ rales/ or rhonchi. no accessory muscle use, no dullness to  percussion  CARD:  RRR, no m/r/g, no peripheral edema, pulses intact, no cyanosis or clubbing.  GI:   Soft & nt; nml bowel sounds; no organomegaly or masses detected.   Musco: Warm bil, no deformities or joint swelling noted.   Neuro: alert, no focal deficits noted.    Skin: Warm, no lesions or rashes    Lab Results:    BMET   BNP No results found for: "BNP"  ProBNP No results found for: "PROBNP"  Imaging: CT Chest High Resolution  Result Date: 12/15/2022 CLINICAL DATA:  Chronic cough EXAM: CT CHEST WITHOUT CONTRAST TECHNIQUE: Multidetector CT imaging of the chest was performed following the standard protocol without intravenous contrast. High resolution imaging of the lungs, as well as inspiratory and expiratory imaging, was performed. RADIATION DOSE REDUCTION: This exam was  performed according to the departmental dose-optimization program which includes automated exposure control, adjustment of the mA and/or kV according to patient size and/or use of iterative reconstruction technique. COMPARISON:  Multiple priors, most recent chest CT dated June 18, 2020 FINDINGS: Cardiovascular: Normal heart size. Trace pericardial effusion, similar to prior exam. Normal caliber thoracic aorta with severe atherosclerotic disease. Severe coronary artery calcifications. Dual lead left chest wall pacer with leads in the right atrium right ventricle. Mediastinum/Nodes: Esophagus and thyroid are unremarkable. No pathologically enlarged lymph nodes seen in the chest. Lungs/Pleura: Central airways are patent. Subtle subpleural reticulations with no clear craniocaudal predominance. No definite traction bronchiectasis, although respiratory motion artifact limits evaluation. Solid pulmonary nodule of the right lower lobe measuring 11 x 8 mm, stable when compared with prior exams dating back to 2006 and considered benign. No consolidation, pleural effusion or pneumothorax. Upper Abdomen: Hepatic steatosis.  Musculoskeletal: No chest wall mass or suspicious bone lesions identified. IMPRESSION: 1. Subtle subpleural reticulations with no definite traction bronchiectasis or clear craniocaudal predominance, respiratory motion artifact somewhat limits evaluation. Consider six-month follow-up ILD CT for further evaluation. Findings are indeterminate for UIP per consensus guidelines: Diagnosis of Idiopathic Pulmonary Fibrosis: An Official ATS/ERS/JRS/ALAT Clinical Practice Guideline. [place], Iss 5, [phone], Jul 14 2017. 2. Solid right lower lobe pulmonary nodule unchanged in size when compared with priors dating back to 2007 and considered benign. 3. Severe coronary artery calcifications. 4. Hepatic steatosis. 5. Aortic Atherosclerosis (ICD10-I70.0). Electronically Signed   By: Yetta Glassman M.D.   On: 12/15/2022 18:27   DG Chest 2 View  Result Date: 12/09/2022 CLINICAL DATA:  Cough, left upper quadrant pain EXAM: CHEST - 2 VIEW COMPARISON:  08/09/2022 FINDINGS: Left chest multi lead pacer. The heart size and mediastinal contours are within normal limits. Diffuse bilateral interstitial pulmonary opacity. The visualized skeletal structures are unremarkable. IMPRESSION: Diffuse bilateral interstitial pulmonary opacity, consistent with edema or atypical/viral infection. No focal airspace opacity. Electronically Signed   By: Delanna Ahmadi M.D.   On: 12/09/2022 17:50          No data to display          No results found for: "NITRICOXIDE"      Assessment & Plan:   No problem-specific Assessment & Plan notes found for this encounter.    I spent 45   minutes dedicated to the care of this patient on the date of this encounter to include pre-visit review of records, face-to-face time with the patient discussing conditions above, post visit ordering of testing, clinical documentation with the electronic health record, making appropriate referrals as documented, and communicating  necessary findings to members of the patients care team.   Rexene Edison, NP 12/21/2022

## 2022-12-21 NOTE — Progress Notes (Signed)
I, [name], LAT, ATC acting as a scribe for Jonathan Leader, MD.  [name] is a 77 y.o. male who presents to [place] at [facility] today for 3-mo f/u cont'd chronic SIJ, low back, and L lateral hip pain. Pt was last seen by Dr. Georgina Snell on 09/29/22 and was given bilat SIJ steroid injections and taught HEP for hip aBd strengthening. Pt's last lumbar facet injections were on 10/17/21. Today, pt reports LBP has returned over the last few weeks. Pt locates pain to bilat sides of his low back. Pt has been working HEP for his L hip, but cont to have pain and stiffness.   Additionally he has pain at the left lower anterior rib area.  He has developed a cough over the last few months and has significant pain in the left lower rib margin anteriorly with his cough.  He has had extensive workup already with his primary care provider and also with pulmonology culminating in a CT scan of his chest just recently that was reassuring.  Dx imaging: 08/10/21 L-spine CT myelogram             05/11/21 R hip & L-spine XR  Pertinent review of systems: No fevers or chills  Relevant historical information: Hypertension.  Heart block.  Lives part-time in [place] area.   Exam:  BP (!) 150/92   Pulse 79   Ht 6' (1.829 m)   Wt 235 lb (106.6 kg)   SpO2 97%   BMI 31.87 kg/m  General: Well Developed, well nourished, and in no acute distress.   MSK: L-spine: Normal appearing Nontender palpation spinal midline.  Tender palpation bilateral SI joints.  Decreased lumbar motion.  Rib margin: Tender palpation left lower anterior rib margin.    Lab and Radiology Results   Procedure: Real-time Ultrasound Guided Injection of right SI joint Device: Philips Affiniti 50G Images permanently stored and available for review in PACS Verbal informed consent obtained.  Discussed risks and benefits of procedure. Warned about infection, bleeding, hyperglycemia damage to structures among  others. Patient expresses understanding and agreement Time-out conducted.   Noted no overlying erythema, induration, or other signs of local infection.   Skin prepped in a sterile fashion.   Local anesthesia: Topical Ethyl chloride.   With sterile technique and under real time ultrasound guidance: 40 mg of Kenalog and 2 mL Marcaine injected into right SI joint. Fluid seen entering the joint capsule.   Completed without difficulty   Pain immediately resolved suggesting accurate placement of the medication.   Advised to call if fevers/chills, erythema, induration, drainage, or persistent bleeding.   Images permanently stored and available for review in the ultrasound unit.  Impression: Technically successful ultrasound guided injection.    Procedure: Real-time Ultrasound Guided Injection of left SI joint Device: Philips Affiniti 50G Images permanently stored and available for review in PACS Verbal informed consent obtained.  Discussed risks and benefits of procedure. Warned about infection, bleeding, hyperglycemia damage to structures among others. Patient expresses understanding and agreement Time-out conducted.   Noted no overlying erythema, induration, or other signs of local infection.   Skin prepped in a sterile fashion.   Local anesthesia: Topical Ethyl chloride.   With sterile technique and under real time ultrasound guidance: 40 mg of Kenalog and 2 ml Marcaine injected into SI joint. Fluid seen entering the joint capsule.   Completed without difficulty   Pain immediately resolved suggesting accurate placement of the medication.   Advised  to call if fevers/chills, erythema, induration, drainage, or persistent bleeding.   Images permanently stored and available for review in the ultrasound unit.  Impression: Technically successful ultrasound guided injection.    EXAM: CT CHEST WITHOUT CONTRAST   TECHNIQUE: Multidetector CT imaging of the chest was performed following  the standard protocol without intravenous contrast. High resolution imaging of the lungs, as well as inspiratory and expiratory imaging, was performed.   RADIATION DOSE REDUCTION: This exam was performed according to the departmental dose-optimization program which includes automated exposure control, adjustment of the mA and/or kV according to patient size and/or use of iterative reconstruction technique.   COMPARISON:  Multiple priors, most recent chest CT dated June 18, 2020   FINDINGS: Cardiovascular: Normal heart size. Trace pericardial effusion, similar to prior exam. Normal caliber thoracic aorta with severe atherosclerotic disease. Severe coronary artery calcifications. Dual lead left chest wall pacer with leads in the right atrium right ventricle.   Mediastinum/Nodes: Esophagus and thyroid are unremarkable. No pathologically enlarged lymph nodes seen in the chest.   Lungs/Pleura: Central airways are patent. Subtle subpleural reticulations with no clear craniocaudal predominance. No definite traction bronchiectasis, although respiratory motion artifact limits evaluation. Solid pulmonary nodule of the right lower lobe measuring 11 x 8 mm, stable when compared with prior exams dating back to 2006 and considered benign. No consolidation, pleural effusion or pneumothorax.   Upper Abdomen: Hepatic steatosis.   Musculoskeletal: No chest wall mass or suspicious bone lesions identified.   IMPRESSION: 1. Subtle subpleural reticulations with no definite traction bronchiectasis or clear craniocaudal predominance, respiratory motion artifact somewhat limits evaluation. Consider six-month follow-up ILD CT for further evaluation. Findings are indeterminate for UIP per consensus guidelines: Diagnosis of Idiopathic Pulmonary Fibrosis: An Official ATS/ERS/JRS/ALAT Clinical Practice Guideline. [place], Iss 5, [phone], Jul 14 2017. 2. Solid right lower  lobe pulmonary nodule unchanged in size when compared with priors dating back to 2007 and considered benign. 3. Severe coronary artery calcifications. 4. Hepatic steatosis. 5. Aortic Atherosclerosis (ICD10-I70.0).     Electronically Signed   By: Yetta Glassman M.D.   On: 12/15/2022 18:27    I, [name], personally (independently) visualized and performed the interpretation of the images attached in this note.     Assessment and Plan: 77 y.o. male with chronic low back pain thought to be bilateral SI joint dysfunction.  Plan for repeat SI joint injections.  Last injections were about 3 months ago.  Plan to recheck in about 3 months.  Anticipate doing these injections at that time.  Additionally he has new to me left anterior rib pain.  This is now a chronic problem but it has been ongoing.  He had several assessment and reevaluations by his primary care provider and subsequently pulmonology.  CT scan was reassuring that were not missing some obvious pulmonology issue or rib pathology issue here.  I think it was likely explanation this point is chest wall muscle pain.  We discussed some pragmatic things that he can do such as using a throat pillow when he coughs.  However the main treatment etiology should be physical therapy.  Plan to refer to PT at [place].   PDMP not reviewed this encounter. Orders Placed This Encounter  Procedures   [place] LIMITED JOINT SPACE STRUCTURES LOW BILAT(NO LINKED CHARGES)    Order Specific Question:   Reason for Exam (SYMPTOM  OR DIAGNOSIS REQUIRED)    Answer:   bilateral SIJ pain  Order Specific Question:   Preferred imaging location?    Answer:   [place]   Ambulatory referral to Physical Therapy    Referral Priority:   Routine    Referral Type:   Physical Medicine    Referral Reason:   Specialty Services Required    Requested Specialty:   Physical Therapy    Number of Visits Requested:   1   No orders of  the defined types were placed in this encounter.    Discussed warning signs or symptoms. Please see discharge instructions. Patient expresses understanding.   The above documentation has been reviewed and is accurate and complete [name], M.D.

## 2022-12-21 NOTE — Patient Instructions (Addendum)
Begin Stiolto 2 puffs daily Prednisone '20mg'$  daily for 5 days  Albuterol inhaler As needed   Begin Delsym 2 tsp Twice daily  for cough As needed  (OTC ) Tessalon Three times a day  for cough as needed  Warm heat to ribs Twice daily   Begin Claritin '10mg'$  daily in am (OTC)  Begin Chlorpheniramine '4mg'$  At bedtime  (OTC)  Continue on Prilosec and Pepcid  Sips of water to soothe throat , no MINTS.  HRCT Chest in 1 year .  Follow up in 4 weeks with PFT and As needed   Please contact office for sooner follow up if symptoms do not improve or worsen or seek emergency care

## 2022-12-22 ENCOUNTER — Ambulatory Visit: Payer: Self-pay

## 2022-12-22 ENCOUNTER — Ambulatory Visit (INDEPENDENT_AMBULATORY_CARE_PROVIDER_SITE_OTHER): Payer: PPO | Admitting: Family Medicine

## 2022-12-22 VITALS — BP 150/92 | HR 79 | Ht 72.0 in | Wt 235.0 lb

## 2022-12-22 DIAGNOSIS — R0600 Dyspnea, unspecified: Secondary | ICD-10-CM | POA: Insufficient documentation

## 2022-12-22 DIAGNOSIS — G8929 Other chronic pain: Secondary | ICD-10-CM

## 2022-12-22 DIAGNOSIS — R0609 Other forms of dyspnea: Secondary | ICD-10-CM | POA: Insufficient documentation

## 2022-12-22 DIAGNOSIS — R0781 Pleurodynia: Secondary | ICD-10-CM | POA: Diagnosis not present

## 2022-12-22 DIAGNOSIS — M533 Sacrococcygeal disorders, not elsewhere classified: Secondary | ICD-10-CM | POA: Diagnosis not present

## 2022-12-22 NOTE — Patient Instructions (Addendum)
Thank you for coming in today.   You received an injection today. Seek immediate medical attention if the joint becomes red, extremely painful, or is oozing fluid.   I've referred you to Physical Therapy.  Let us know if you don't hear from them in one week.   Check back in 3 months

## 2022-12-22 NOTE — Assessment & Plan Note (Signed)
Chronic cough-component of upper airway cough syndrome.  Will add cough control regimen with Delsym and Tessalon Perles.  Recommend continued trigger prevention with GERD and chronic rhinitis. No significant ILD on high-res CT chest however some subtle changes can repeat a CT and 1 year to follow-up.  Also set up for PFTs.  May have a component of COPD with heavy smoking history We will give a short course of prednisone to see if we can get control of this cough and pleuritic pain  Plan  Patient Instructions  Begin Stiolto 2 puffs daily Prednisone 67m daily for 5 days  Albuterol inhaler As needed   Begin Delsym 2 tsp Twice daily  for cough As needed  (OTC ) Tessalon Three times a day  for cough as needed  Warm heat to ribs Twice daily   Begin Claritin 159mdaily in am (OTC)  Begin Chlorpheniramine 64m35mt bedtime  (OTC)  Continue on Prilosec and Pepcid  Sips of water to soothe throat , no MINTS.  HRCT Chest in 1 year .  Follow up in 4 weeks with PFT and As needed   Please contact office for sooner follow up if symptoms do not improve or worsen or seek emergency care

## 2022-12-22 NOTE — Assessment & Plan Note (Signed)
Chronic dyspnea with exertion.-Will check PFTs on return.  May have a component of COPD-as has a heavy smoking history.  Trial of Stiolto.  Plan  Patient Instructions  Begin Stiolto 2 puffs daily Prednisone 813m daily for 5 days  Albuterol inhaler As needed   Begin Delsym 2 tsp Twice daily  for cough As needed  (OTC ) Tessalon Three times a day  for cough as needed  Warm heat to ribs Twice daily   Begin Claritin 1813mdaily in am (OTC)  Begin Chlorpheniramine 13m29mt bedtime  (OTC)  Continue on Prilosec and Pepcid  Sips of water to soothe throat , no MINTS.  HRCT Chest in 1 year .  Follow up in 4 weeks with PFT and As needed   Please contact office for sooner follow up if symptoms do not improve or worsen or seek emergency care

## 2022-12-22 NOTE — Assessment & Plan Note (Signed)
Left-sided pleuritic pain along the lower left rib questionable etiology.  May be secondary to chronic cough.  Advised to use some warm heat.  Will use a short course of steroids.  Cough control regimen.  Plan  Patient Instructions  Begin Stiolto 2 puffs daily Prednisone 28m daily for 5 days  Albuterol inhaler As needed   Begin Delsym 2 tsp Twice daily  for cough As needed  (OTC ) Tessalon Three times a day  for cough as needed  Warm heat to ribs Twice daily   Begin Claritin 150mdaily in am (OTC)  Begin Chlorpheniramine 64m66mt bedtime  (OTC)  Continue on Prilosec and Pepcid  Sips of water to soothe throat , no MINTS.  HRCT Chest in 1 year .  Follow up in 4 weeks with PFT and As needed   Please contact office for sooner follow up if symptoms do not improve or worsen or seek emergency care

## 2022-12-23 ENCOUNTER — Encounter: Payer: Self-pay | Admitting: Internal Medicine

## 2022-12-25 ENCOUNTER — Ambulatory Visit: Payer: PPO | Admitting: Family Medicine

## 2022-12-25 MED ORDER — GABAPENTIN 300 MG PO CAPS: 300.0000 mg | ORAL_CAPSULE | Freq: Three times a day (TID) | ORAL | 2 refills | Status: DC

## 2022-12-27 ENCOUNTER — Telehealth: Payer: Self-pay

## 2022-12-27 ENCOUNTER — Ambulatory Visit: Payer: PPO

## 2022-12-27 DIAGNOSIS — I442 Atrioventricular block, complete: Secondary | ICD-10-CM

## 2022-12-27 LAB — CUP PACEART REMOTE DEVICE CHECK
Battery Impedance: 2909 Ohm
Battery Remaining Longevity: 27 mo
Battery Voltage: 2.73 V
Brady Statistic AP VP Percent: 8 %
Brady Statistic AP VS Percent: 0 %
Brady Statistic AS VP Percent: 92 %
Brady Statistic AS VS Percent: 0 %
Date Time Interrogation Session: 20240214111738
Implantable Lead Connection Status: 753985
Implantable Lead Connection Status: 753985
Implantable Lead Implant Date: 20120302
Implantable Lead Implant Date: 20120302
Implantable Lead Location: 753859
Implantable Lead Location: 753860
Implantable Lead Model: 5076
Implantable Pulse Generator Implant Date: 20120302
Lead Channel Impedance Value: 534 Ohm
Lead Channel Impedance Value: 685 Ohm
Lead Channel Pacing Threshold Amplitude: 0.5 V
Lead Channel Pacing Threshold Amplitude: 0.625 V
Lead Channel Pacing Threshold Pulse Width: 0.4 ms
Lead Channel Pacing Threshold Pulse Width: 0.4 ms
Lead Channel Setting Pacing Amplitude: 2 V
Lead Channel Setting Pacing Amplitude: 2 V
Lead Channel Setting Pacing Pulse Width: 0.4 ms
Lead Channel Setting Sensing Sensitivity: 2.8 mV
Zone Setting Status: 755011
Zone Setting Status: 755011

## 2022-12-27 NOTE — Telephone Encounter (Signed)
Alert received from [employer]:  Scheduled remote reviewed. Normal device function.   Persistent AF/AFL since 12/20, burden 62.1%, NO OAC - route to triage high priority  Outreach made to Pt.  He has not noticed any change in his health, denies heart racing, shortness of breath or fatigue.    [place] the beach and will not return until January 16, 2023.  Scheduled follow up with Dr. Myles Gip on January 18, 2023.

## 2022-12-28 NOTE — Progress Notes (Signed)
Spoke with pt and notified of results per Dr. Wert. Pt verbalized understanding and denied any questions. 

## 2022-12-29 ENCOUNTER — Ambulatory Visit: Payer: PPO | Admitting: Family Medicine

## 2023-01-03 MED ORDER — APIXABAN 5 MG PO TABS: 5.0000 mg | ORAL_TABLET | Freq: Two times a day (BID) | ORAL | 11 refills | Status: DC

## 2023-01-03 NOTE — Telephone Encounter (Signed)
Spoke with Pt.  Advised per Dr. Dennison Nancy Eliquis 5 mg PO BID.  Discussed with Pt.  He is agreeable.  Side effects discussed.  30 day prescription sent locally to Pt.  Advised to follow up as scheduled.

## 2023-01-03 NOTE — Telephone Encounter (Signed)
Attempted to contact patient, no answer/unable to leave message.

## 2023-01-18 ENCOUNTER — Ambulatory Visit: Payer: PPO | Attending: Cardiovascular Disease | Admitting: Cardiovascular Disease

## 2023-01-18 ENCOUNTER — Encounter: Payer: Self-pay | Admitting: Cardiovascular Disease

## 2023-01-18 VITALS — BP 150/86 | HR 86 | Ht 72.0 in | Wt 226.0 lb

## 2023-01-18 DIAGNOSIS — I471 Supraventricular tachycardia, unspecified: Secondary | ICD-10-CM

## 2023-01-18 MED ORDER — APIXABAN 5 MG PO TABS: 5.0000 mg | ORAL_TABLET | Freq: Two times a day (BID) | ORAL | 2 refills | Status: DC

## 2023-01-18 NOTE — Patient Instructions (Signed)
Medication Instructions:  Your physician recommends that you continue on your current medications as directed. Please refer to the Current Medication list given to you today.  *If you need a refill on your cardiac medications before your next appointment, please call your pharmacy*   Lab Work: None ordered   Testing/Procedures: None ordered   Follow-Up: At [facility], you and your health needs are our priority.  As part of our continuing mission to provide you with exceptional heart care, we have created designated Provider Care Teams.  These Care Teams include your primary Cardiologist (physician) and Advanced Practice Providers (APPs -  Physician Assistants and Nurse Practitioners) who all work together to provide you with the care you need, when you need it.  Remote monitoring is used to monitor your Pacemaker or ICD from home. This monitoring reduces the number of office visits required to check your device to one time per year. It allows [place] to keep an eye on the functioning of your device to ensure it is working properly. You are scheduled for a device check from home on 03/28/2023. You may send your transmission at any time that day. If you have a wireless device, the transmission will be sent automatically. After your physician reviews your transmission, you will receive a postcard with your next transmission date.  Your next appointment:   1 year(s)  The format for your next appointment:   In Person  Provider:   You will see one of the following Advanced Practice Providers on your designated Care Team:   Tommye Standard, [place] Legrand Como "Jonni Sanger" Chalmers Cater, [place]  Thank you for choosing [facility]!!   [phone]

## 2023-01-18 NOTE — Progress Notes (Signed)
PCP: Binnie Rail, MD   Primary EP:  Dr Maryla Morrow is a 77 y.o. male who presents today for routine electrophysiology followup.  Since last being seen in our clinic, the patient reports doing very well.  Today, he denies symptoms of palpitations, chest pain, shortness of breath,  lower extremity edema, dizziness, presyncope, or syncope.  The patient is otherwise without complaint today.   Since his last visit, he had a sustained atrial high rate episode detected. The atrial rate was about 250 bpm. V rates are controlled, pt is V-paced.  he has no device related complaints -- no new tenderness, drainage, redness.     Past Medical History:  Diagnosis Date   Complete heart block (HCC)    S/P  PPM by Dr Rayann Heman   Coronary artery disease    Non-obstructive. Pt had left heart catheterization in August 2009 showing a 30% mid-circumflex lesion, otherwise coronaries were clean angiographically   Hyperlipidemia    Hypertension    OSA (obstructive sleep apnea)    Probable; study never scheduled   Past Surgical History:  Procedure Laterality Date   [place], 2009   2009- negative   COLONOSCOPY  2011   negative, [facility]   PACEMAKER INSERTION  01/13/11   MDT by Greggory Brandy for complete heart block    ROS- all systems are reviewed and negative except as per HPI above  Current Outpatient Medications  Medication Sig Dispense Refill   albuterol (VENTOLIN HFA) 108 (90 Base) MCG/ACT inhaler Inhale 1-2 puffs into the lungs every 6 (six) hours as needed. 8 g 2   allopurinol (ZYLOPRIM) 300 MG tablet TAKE 1/2 TABLET BY MOUTH EVERY DAY 45 tablet 1   apixaban (ELIQUIS) 5 MG TABS tablet Take 1 tablet (5 mg total) by mouth 2 (two) times daily. 60 tablet 11   benzonatate (TESSALON) 200 MG capsule Take 1 capsule (200 mg total) by mouth 3 (three) times daily as needed. 45 capsule 1   famotidine (PEPCID) 20 MG tablet One after supper 30 tablet 11   gabapentin (NEURONTIN)  300 MG capsule Take 1 capsule (300 mg total) by mouth 3 (three) times daily. 90 capsule 2   pantoprazole (PROTONIX) 40 MG tablet Take 1 tablet (40 mg total) by mouth daily. Take 30-60 min before first meal of the day 30 tablet 2   predniSONE (DELTASONE) 20 MG tablet Take 1 tablet (20 mg total) by mouth daily with breakfast. 5 tablet 0   sildenafil (REVATIO) 20 MG tablet TAKE 3 TO 5 TABLETS BY MOUTH AS NEEDED 100 tablet 0   simvastatin (ZOCOR) 40 MG tablet TAKE 1 TABLET BY MOUTH DAILY AT 6 PM. 90 tablet 2   Tdap (BOOSTRIX) 5-2.5-18.5 LF-MCG/0.5 injection Inject into the muscle. 0.5 mL 0   Tiotropium Bromide-Olodaterol (STIOLTO RESPIMAT) 2.5-2.5 MCG/ACT AERS Inhale 2 puffs into the lungs daily. 1 each 5   Tiotropium Bromide-Olodaterol (STIOLTO RESPIMAT) 2.5-2.5 MCG/ACT AERS Inhale 2 puffs into the lungs daily. 4 g 0   valsartan (DIOVAN) 80 MG tablet Take 1 tablet (80 mg total) by mouth daily. (Patient taking differently: Take 40 mg by mouth daily. Patient cuts 80 mg tablet in half  Takes 40 mg) 30 tablet 11   No current facility-administered medications for this visit.    Physical Exam: Vitals:   01/18/23 1315  BP: (!) 150/86  Pulse: 86  SpO2: 99%  Weight: 226 lb (102.5 kg)  Height: 6' (1.829 m)  GEN- The patient is well appearing, alert and oriented x 3 today.   Head- normocephalic, atraumatic Lungs- normal work of breathing Chest- pacemaker pocket is well healed Heart- Regular rate and rhythm, no murmurs, rubs or gallops, PMI not laterally displaced GI- soft, NT, ND, + BS Extremities- no clubbing, cyanosis, or edema  Pacemaker interrogation- reviewed in detail today,  See PACEART report  ekg tracing ordered today is personally reviewed and shows AV paced  Assessment and Plan:  1. Symptomatic complete heart block Normal pacemaker function See Pace Art report I decreased the upper rate limit to 120 bpm he is not device dependant today  2. Atrial high rate episodes -- most  consistent with flutter  Noted on PPM -- completely asymptomatic, rate controlled. Will continue to monitor through device. No changes  3. HTN   Return to see EP APP annually  Melida Quitter, MD  01/18/2023 1:13 PM

## 2023-01-25 NOTE — Progress Notes (Signed)
Remote pacemaker transmission.   

## 2023-01-31 ENCOUNTER — Ambulatory Visit (HOSPITAL_BASED_OUTPATIENT_CLINIC_OR_DEPARTMENT_OTHER): Payer: PPO | Attending: Family Medicine | Admitting: Physical Therapy

## 2023-01-31 ENCOUNTER — Encounter (HOSPITAL_BASED_OUTPATIENT_CLINIC_OR_DEPARTMENT_OTHER): Payer: Self-pay | Admitting: Physical Therapy

## 2023-01-31 ENCOUNTER — Other Ambulatory Visit: Payer: Self-pay

## 2023-01-31 DIAGNOSIS — R0781 Pleurodynia: Secondary | ICD-10-CM

## 2023-01-31 DIAGNOSIS — Z7409 Other reduced mobility: Secondary | ICD-10-CM | POA: Insufficient documentation

## 2023-01-31 DIAGNOSIS — R293 Abnormal posture: Secondary | ICD-10-CM | POA: Diagnosis not present

## 2023-01-31 DIAGNOSIS — M62838 Other muscle spasm: Secondary | ICD-10-CM | POA: Diagnosis not present

## 2023-01-31 NOTE — Therapy (Signed)
OUTPATIENT PHYSICAL THERAPY THORACOLUMBAR EVALUATION   Patient Name: [name] MRN: [id number] DOB:[birth year], 77 y.o., male Today's Date: 01/31/2023  END OF SESSION:  PT End of Session - 01/31/23 1156     Visit Number 1    Number of Visits 12    Date for PT Re-Evaluation 05/01/23    Authorization Type HTA    PT Start Time 1145    PT Stop Time 1225    PT Time Calculation (min) 40 min    Activity Tolerance Patient tolerated treatment well    Behavior During Therapy WFL for tasks assessed/performed             Past Medical History:  Diagnosis Date   Complete heart block (HCC)    S/P  PPM by Dr Rayann Heman   Coronary artery disease    Non-obstructive. Pt had left heart catheterization in August 2009 showing a 30% mid-circumflex lesion, otherwise coronaries were clean angiographically   Hyperlipidemia    Hypertension    OSA (obstructive sleep apnea)    Probable; study never scheduled   Past Surgical History:  Procedure Laterality Date   [place], 2009   2009- negative   COLONOSCOPY  2011   negative, [facility]   PACEMAKER INSERTION  01/13/11   MDT by Greggory Brandy for complete heart block   Patient Active Problem List   Diagnosis Date Noted   Chronic left SI joint pain 12/22/2022   Dyspnea 12/22/2022   Chest pain, atypical 10/10/2022   Upper airway cough syndrome 10/10/2022   Subacute cough 02/01/2022   Chronic right SI joint pain [id number]   Umbilical hernia without obstruction or gangrene 02/02/2021   Hepatic steatosis 08/05/2020   Aortic atherosclerosis ([place]) 08/04/2020   Complex regional pain syndrome type I of the upper limb 07/26/2020   Flexion contracture of joint of hand 07/26/2020   Pacemaker 07/26/2020   Rib pain on left side 06/09/2020   Decreased pulses in feet 06/09/2020   Erectile dysfunction 03/03/2017   Chronic headaches 02/13/2017   Snoring 02/15/2015   Elevated liver enzymes 09/13/2014   Dupuytren's contracture of both hands  09/02/2013   Stiffness of right hand joint 05/07/2013   History of gout 12/27/2011   Essential hypertension 01/12/2011   Complete heart block (HCC) - PPM 01/12/2011   Hyperglycemia 03/02/2009   HYPERPLASIA PROSTATE UNS W/O UR OBST & OTH LUTS 08/13/2008   Hyperlipidemia 02/17/2008   PAROXYSMAL SUPRAVENTRICULAR TACHYCARDIA 02/17/2008    PCP:    Binnie Rail, MD    REFERRING PROVIDER: Gregor Hams, MD   REFERRING DIAG:  Diagnosis  R07.81 (ICD-10-CM) - Rib pain   L sided  Rationale for Evaluation and Treatment: Rehabilitation  THERAPY DIAG:  Rib pain  ONSET DATE: Fall 2023  SUBJECTIVE:  SUBJECTIVE STATEMENT: Pt states that the pain started as a sharp pain after a being sick. Coughing and rolling to get out of bed caused sharp pain. Cough still persists and is using a prescribed inhaler. Pt has coughing episodes about 3-4x a day. Pt states felt like a broken rib like before.  Pt is going for a breathing test come April 4th to test for pulmonary issues- Dr. Melvyn Novas. Pain is worse in the morning. Pt states that lifting the arm or getting dressed does not cause trouble but does feel tight. Does not hurt to touch. Denies cancer red flags.  Pt is well known to evaluating PT for history of lumbar and radiculopathy.  PERTINENT HISTORY:  A-fib, on blood thinners  PAIN:  Are you having pain? no: NPRS scale: 0/10; 7/10 Pain location: L anterior chest Pain description: sharp, "feels like my previous broken rib" Aggravating factors: coughing, deep breath Relieving factors: rest  PRECAUTIONS: ICD/Pacemaker  WEIGHT BEARING RESTRICTIONS: No  FALLS:  Has patient fallen in last 6 months? No  LIVING ENVIRONMENT: Lives with: lives with their spouse Lives in: House/apartment Stairs: Yes to enter Has  following equipment at home: None  OCCUPATION: retired  PLOF: Independent with basic ADLs  PATIENT GOALS: reduce pain   OBJECTIVE:   DIAGNOSTIC FINDINGS:  IMPRESSION: 1. Subtle subpleural reticulations with no definite traction bronchiectasis or clear craniocaudal predominance, respiratory motion artifact somewhat limits evaluation. Consider six-month follow-up ILD CT for further evaluation. Findings are indeterminate for UIP per consensus guidelines: Diagnosis of Idiopathic Pulmonary Fibrosis: An Official ATS/ERS/JRS/ALAT Clinical Practice Guideline. [place], Iss 5, [phone], Jul 14 2017. 2. Solid right lower lobe pulmonary nodule unchanged in size when compared with priors dating back to 2007 and considered benign. 3. Severe coronary artery calcifications. 4. Hepatic steatosis. 5. Aortic Atherosclerosis (ICD10-I70.0).   IMPRESSION: Diffuse bilateral interstitial pulmonary opacity, consistent with edema or atypical/viral infection. No focal airspace opacity   PATIENT SURVEYS:  FOTO 66 69 @ DC  3 pts MCII  SCREENING FOR RED FLAGS: Bowel or bladder incontinence: No Spinal tumors: No Cauda equina syndrome: No Compression fracture: No  COGNITION: Overall cognitive status: Within functional limits for tasks assessed     SENSATION: WFL  POSTURE: decreased lumbar lordosis and increased thoracic kyphosis  PALPATION: TTP of L subscapularis, intercostals  THORACOLUMBAR ROM: pt with known history of lumbar ROM deficits and pain; no recreation of rib pain with movement; no TTP along costal angle or chondral border  AROM eval  Flexion 75%  Extension 50%  Right lateral flexion 50%  Left lateral flexion 50%  Right rotation 50%  Left rotation 50%   (Blank rows = not tested)  SPECIAL TESTS:  Rib springing (negative)   TODAY'S TREATMENT:  DATE: 3/20    STM to L serratus and intercostals under pec; active spasms with ischemic pressure Exercises - Standing Quadratus Lumborum Stretch with Doorway  - 1 x daily - 7 x weekly - 3 sets - 10 reps - Seated Quadratus Lumborum Stretch in Chair  - 2 x daily - 7 x weekly - 1 sets - 3 reps - 30 hold   PATIENT EDUCATION:  Education details: MOI, diagnosis, prognosis, anatomy, exercise progression, DOMS expectations, muscle firing,  envelope of function, HEP, POC  Person educated: Patient Education method: Explanation, Demonstration, Tactile cues, Verbal cues, and Handouts Education comprehension: verbalized understanding, returned demonstration, verbal cues required, tactile cues required, and needs further education  HOME EXERCISE PROGRAM: Access Code: [email or web address] URL: [email or web address] Date: 01/31/2023 Prepared by: Daleen Bo   ASSESSMENT:  CLINICAL IMPRESSION: Patient is a 77 y.o. male who was seen today for physical therapy evaluation and treatment for c/c of R rib pain. Pt's s/s appear consistent with intercostal strain and potential costochondritis although no pain could be recreated in clinic outside of palpation of soft tissue. Pt's pain is minimally sensitive and irritable with movement. Plan to continue with STM and soft tissue extensibility/stretching at future sessions. Pt's pain appears related to persistent chronic cough which pt is seeking further medical management for. Pt would benefit from continued skilled therapy in order to reach goals and maximize functional T/S and rib function for return to PLOF.    OBJECTIVE IMPAIRMENTS: decreased activity tolerance, decreased endurance, decreased mobility, decreased ROM, decreased strength, hypomobility, increased muscle spasms, impaired flexibility, improper body mechanics, postural dysfunction, and pain.   ACTIVITY LIMITATIONS: carrying, lifting, sleeping, transfers, dressing, and reach over  head  PARTICIPATION LIMITATIONS: cleaning, laundry, community activity, and exercise  PERSONAL FACTORS: Age, Fitness, Time since onset of injury/illness/exacerbation, and 3+ comorbidities:    are also affecting patient's functional outcome.   REHAB POTENTIAL: Fair  CLINICAL DECISION MAKING: Stable/uncomplicated  EVALUATION COMPLEXITY: Low   SHORT TERM GOALS: Target date: 03/14/2023   Pt will become independent with HEP in order to demonstrate synthesis of PT education.  Baseline: Goal status: INITIAL  2.  Pt will report at least 2 pt reduction on NPRS scale for pain in order to demonstrate functional improvement with household activity, self care, and ADL.  Baseline:  Goal status: INITIAL  3.  Pt will score at least 3 pt increase on FOTO to demonstrate functional improvement in MCII and pt perceived function.   Baseline:  Goal status: INITIAL  LONG TERM GOALS: Target date: 04/25/2023   Pt  will become independent with final HEP in order to demonstrate synthesis of PT education.  Baseline:  Goal status: INITIAL  2.  Pt will score >/= 69 on FOTO to demonstrate improvement in perceived trunk/rib function.  Baseline:  Goal status: INITIAL  3.  Pt will be able to demonstrate ability to take deep breath and/or cough with minimized pain in order to demonstrate functional improvement in UE/LE function for self-care and house hold duties.  Baseline:  Goal status: INITIAL  PLAN:  PT FREQUENCY: 1x/week  PT DURATION: 12 weeks (likely to D/C by 4 wks if no improvement)  PLANNED INTERVENTIONS: Therapeutic exercises, Therapeutic activity, Neuromuscular re-education, Balance training, Gait training, Patient/Family education, Self Care, Joint mobilization, Joint manipulation, Orthotic/Fit training, DME instructions, Aquatic Therapy, Dry Needling, Electrical stimulation, Spinal manipulation, Spinal mobilization, Cryotherapy, Moist heat, scar mobilization, Splintting, Taping, Vasopneumatic  device, Traction, Ultrasound, Ionotophoresis 4mg /ml Dexamethasone, Manual therapy, and Re-evaluation.  PLAN FOR NEXT  SESSION: STM to R serratus and L intercostals, consider rib mobilizations, seated T/S mobility- avoid lumbar aggravation   Daleen Bo, PT 01/31/2023, 12:57 PM

## 2023-02-04 ENCOUNTER — Other Ambulatory Visit: Payer: Self-pay | Admitting: Internal Medicine

## 2023-02-04 ENCOUNTER — Other Ambulatory Visit: Payer: Self-pay | Admitting: Adult Health

## 2023-02-08 NOTE — Therapy (Signed)
OUTPATIENT PHYSICAL THERAPY THORACOLUMBAR TREATMENT   Patient Name: [name] MRN: [id number] DOB:06-28-1946, 77 y.o., male Today's Date: 02/12/2023  END OF SESSION:  PT End of Session - 02/12/23 1149     Visit Number 2    Number of Visits 12    Date for PT Re-Evaluation 05/01/23    Authorization Type HTA    PT Start Time 1145    PT Stop Time 1225    PT Time Calculation (min) 40 min    Activity Tolerance Patient tolerated treatment well    Behavior During Therapy WFL for tasks assessed/performed              Past Medical History:  Diagnosis Date   Complete heart block    S/P  PPM by Dr Rayann Heman   Coronary artery disease    Non-obstructive. Pt had left heart catheterization in August 2009 showing a 30% mid-circumflex lesion, otherwise coronaries were clean angiographically   Hyperlipidemia    Hypertension    OSA (obstructive sleep apnea)    Probable; study never scheduled   Past Surgical History:  Procedure Laterality Date   [place], 2009   2009- negative   COLONOSCOPY  2011   negative, [facility]   PACEMAKER INSERTION  01/13/11   MDT by Greggory Brandy for complete heart block   Patient Active Problem List   Diagnosis Date Noted   Chronic left SI joint pain 12/22/2022   Dyspnea 12/22/2022   Chest pain, atypical 10/10/2022   Upper airway cough syndrome 10/10/2022   Subacute cough 02/01/2022   Chronic right SI joint pain [id number]   Umbilical hernia without obstruction or gangrene 02/02/2021   Hepatic steatosis 08/05/2020   Aortic atherosclerosis 08/04/2020   Complex regional pain syndrome type I of the upper limb 07/26/2020   Flexion contracture of joint of hand 07/26/2020   Pacemaker 07/26/2020   Rib pain on left side 06/09/2020   Decreased pulses in feet 06/09/2020   Erectile dysfunction 03/03/2017   Chronic headaches 02/13/2017   Snoring 02/15/2015   Elevated liver enzymes 09/13/2014   Dupuytren's contracture of both hands 09/02/2013    Stiffness of right hand joint 05/07/2013   History of gout 12/27/2011   Essential hypertension 01/12/2011   Complete heart block (HCC) - PPM 01/12/2011   Hyperglycemia 03/02/2009   HYPERPLASIA PROSTATE UNS W/O UR OBST & OTH LUTS 08/13/2008   Hyperlipidemia 02/17/2008   PAROXYSMAL SUPRAVENTRICULAR TACHYCARDIA 02/17/2008    PCP:    Binnie Rail, MD    REFERRING PROVIDER: Gregor Hams, MD   REFERRING DIAG:  Diagnosis  R07.81 (ICD-10-CM) - Rib pain   L sided  Rationale for Evaluation and Treatment: Rehabilitation  THERAPY DIAG:  Rib pain  ONSET DATE: Fall 2023  SUBJECTIVE:  SUBJECTIVE STATEMENT: Pt states he continues to have pain with coughing and intermittent lower back.   PERTINENT HISTORY:  A-fib, on blood thinners  PAIN:  Are you having pain? no: NPRS scale: 0/10; 7/10 Pain location: L anterior chest Pain description: sharp, "feels like my previous broken rib" Aggravating factors: coughing, deep breath Relieving factors: rest  PRECAUTIONS: ICD/Pacemaker  WEIGHT BEARING RESTRICTIONS: No  FALLS:  Has patient fallen in last 6 months? No  LIVING ENVIRONMENT: Lives with: lives with their spouse Lives in: House/apartment Stairs: Yes to enter Has following equipment at home: None  OCCUPATION: retired  PLOF: Independent with basic ADLs  PATIENT GOALS: reduce pain   OBJECTIVE:   DIAGNOSTIC FINDINGS:  IMPRESSION: 1. Subtle subpleural reticulations with no definite traction bronchiectasis or clear craniocaudal predominance, respiratory motion artifact somewhat limits evaluation. Consider six-month follow-up ILD CT for further evaluation. Findings are indeterminate for UIP per consensus guidelines: Diagnosis of Idiopathic Pulmonary Fibrosis: An Official  ATS/ERS/JRS/ALAT Clinical Practice Guideline. [place], Iss 5, [phone], Jul 14 2017. 2. Solid right lower lobe pulmonary nodule unchanged in size when compared with priors dating back to 2007 and considered benign. 3. Severe coronary artery calcifications. 4. Hepatic steatosis. 5. Aortic Atherosclerosis (ICD10-I70.0).   IMPRESSION: Diffuse bilateral interstitial pulmonary opacity, consistent with edema or atypical/viral infection. No focal airspace opacity   PATIENT SURVEYS:  FOTO 66 69 @ DC  3 pts MCII  SCREENING FOR RED FLAGS: Bowel or bladder incontinence: No Spinal tumors: No Cauda equina syndrome: No Compression fracture: No  COGNITION: Overall cognitive status: Within functional limits for tasks assessed     SENSATION: WFL  POSTURE: decreased lumbar lordosis and increased thoracic kyphosis  PALPATION: TTP of L subscapularis, intercostals  THORACOLUMBAR ROM: pt with known history of lumbar ROM deficits and pain; no recreation of rib pain with movement; no TTP along costal angle or chondral border  AROM eval  Flexion 75%  Extension 50%  Right lateral flexion 50%  Left lateral flexion 50%  Right rotation 50%  Left rotation 50%   (Blank rows = not tested)  SPECIAL TESTS:  Rib springing (negative)   TODAY'S TREATMENT:   Date: 02/12/2023: Nustep for 5 min, lvl 4 while taking subjective.  Seated QL  stretch 3 x30 sec  Seated Cat/ cow  Horizontal abduction with RTB  Seated rows with RTB  OH flexion with 1l bar  Ball roll outs with blue physioball 3 ways  TrA contractions with Red physioball seated press downs.  Seated PPT                                                                                                                              DATE: 3/20    STM to L serratus and intercostals under pec; active spasms with ischemic pressure Exercises - Standing Quadratus Lumborum Stretch with Doorway  - 1 x daily - 7 x weekly -  3 sets -  10 reps - Seated Quadratus Lumborum Stretch in Chair  - 2 x daily - 7 x weekly - 1 sets - 3 reps - 30 hold   PATIENT EDUCATION:  Education details: MOI, diagnosis, prognosis, anatomy, exercise progression, DOMS expectations, muscle firing,  envelope of function, HEP, POC  Person educated: Patient Education method: Explanation, Demonstration, Tactile cues, Verbal cues, and Handouts Education comprehension: verbalized understanding, returned demonstration, verbal cues required, tactile cues required, and needs further education  HOME EXERCISE PROGRAM: Access Code: [email or web address] URL: [email or web address] Date: 01/31/2023 Prepared by: Daleen Bo  ASSESSMENT:  CLINICAL IMPRESSION: Patient returns to PT with continued intermittent pain in his L ribs with coughing. He also reports chronic lower back pain. Session with focus on mobility of QL and lower back. Pt tolerated all exercises well with cues needed for proper form. Encouraged use of a pillow with coughing if applicable. Pt will continue to benefit from skilled PT to address continued deficits.    OBJECTIVE IMPAIRMENTS: decreased activity tolerance, decreased endurance, decreased mobility, decreased ROM, decreased strength, hypomobility, increased muscle spasms, impaired flexibility, improper body mechanics, postural dysfunction, and pain.   ACTIVITY LIMITATIONS: carrying, lifting, sleeping, transfers, dressing, and reach over head  PARTICIPATION LIMITATIONS: cleaning, laundry, community activity, and exercise  PERSONAL FACTORS: Age, Fitness, Time since onset of injury/illness/exacerbation, and 3+ comorbidities:    are also affecting patient's functional outcome.   REHAB POTENTIAL: Fair  CLINICAL DECISION MAKING: Stable/uncomplicated  EVALUATION COMPLEXITY: Low   SHORT TERM GOALS: Target date: 03/14/2023   Pt will become independent with HEP in order to demonstrate synthesis of PT education.  Baseline: Goal  status: INITIAL  2.  Pt will report at least 2 pt reduction on NPRS scale for pain in order to demonstrate functional improvement with household activity, self care, and ADL.  Baseline:  Goal status: INITIAL  3.  Pt will score at least 3 pt increase on FOTO to demonstrate functional improvement in MCII and pt perceived function.   Baseline:  Goal status: INITIAL  LONG TERM GOALS: Target date: 04/25/2023   Pt  will become independent with final HEP in order to demonstrate synthesis of PT education.  Baseline:  Goal status: INITIAL  2.  Pt will score >/= 69 on FOTO to demonstrate improvement in perceived trunk/rib function.  Baseline:  Goal status: INITIAL  3.  Pt will be able to demonstrate ability to take deep breath and/or cough with minimized pain in order to demonstrate functional improvement in UE/LE function for self-care and house hold duties.  Baseline:  Goal status: INITIAL  PLAN:  PT FREQUENCY: 1x/week  PT DURATION: 12 weeks (likely to D/C by 4 wks if no improvement)  PLANNED INTERVENTIONS: Therapeutic exercises, Therapeutic activity, Neuromuscular re-education, Balance training, Gait training, Patient/Family education, Self Care, Joint mobilization, Joint manipulation, Orthotic/Fit training, DME instructions, Aquatic Therapy, Dry Needling, Electrical stimulation, Spinal manipulation, Spinal mobilization, Cryotherapy, Moist heat, scar mobilization, Splintting, Taping, Vasopneumatic device, Traction, Ultrasound, Ionotophoresis 4mg /ml Dexamethasone, Manual therapy, and Re-evaluation.  PLAN FOR NEXT SESSION: STM to R serratus and L intercostals, consider rib mobilizations, seated T/S mobility- avoid lumbar aggravation   Lynden Ang, PT 02/12/2023, 2:21 PM

## 2023-02-12 ENCOUNTER — Encounter: Payer: Self-pay | Admitting: Internal Medicine

## 2023-02-12 ENCOUNTER — Encounter (HOSPITAL_BASED_OUTPATIENT_CLINIC_OR_DEPARTMENT_OTHER): Payer: Self-pay | Admitting: Physical Therapy

## 2023-02-12 ENCOUNTER — Ambulatory Visit (HOSPITAL_BASED_OUTPATIENT_CLINIC_OR_DEPARTMENT_OTHER): Payer: PPO | Attending: Family Medicine | Admitting: Physical Therapy

## 2023-02-12 DIAGNOSIS — G8929 Other chronic pain: Secondary | ICD-10-CM | POA: Diagnosis not present

## 2023-02-12 DIAGNOSIS — R0781 Pleurodynia: Secondary | ICD-10-CM | POA: Diagnosis not present

## 2023-02-12 DIAGNOSIS — M62838 Other muscle spasm: Secondary | ICD-10-CM | POA: Diagnosis not present

## 2023-02-12 DIAGNOSIS — M545 Low back pain, unspecified: Secondary | ICD-10-CM | POA: Insufficient documentation

## 2023-02-12 NOTE — Patient Instructions (Addendum)
Blood work was ordered.   The lab is on the first floor.    Medications changes include :   none     Return in about 6 months (around 08/15/2023) for follow up.    Health Maintenance, Male Adopting a healthy lifestyle and getting preventive care are important in promoting health and wellness. Ask your health care provider about: The right schedule for you to have regular tests and exams. Things you can do on your own to prevent diseases and keep yourself healthy. What should I know about diet, weight, and exercise? Eat a healthy diet  Eat a diet that includes plenty of vegetables, fruits, low-fat dairy products, and lean protein. Do not eat a lot of foods that are high in solid fats, added sugars, or sodium. Maintain a healthy weight Body mass index (BMI) is a measurement that can be used to identify possible weight problems. It estimates body fat based on height and weight. Your health care provider can help determine your BMI and help you achieve or maintain a healthy weight. Get regular exercise Get regular exercise. This is one of the most important things you can do for your health. Most adults should: Exercise for at least 150 minutes each week. The exercise should increase your heart rate and make you sweat (moderate-intensity exercise). Do strengthening exercises at least twice a week. This is in addition to the moderate-intensity exercise. Spend less time sitting. Even light physical activity can be beneficial. Watch cholesterol and blood lipids Have your blood tested for lipids and cholesterol at 75 years of age, then have this test every 5 years. You may need to have your cholesterol levels checked more often if: Your lipid or cholesterol levels are high. You are older than 75 years of age. You are at high risk for heart disease. What should I know about cancer screening? Many types of cancers can be detected early and may often be prevented. Depending on your  health history and family history, you may need to have cancer screening at various ages. This may include screening for: Colorectal cancer. Prostate cancer. Skin cancer. Lung cancer. What should I know about heart disease, diabetes, and high blood pressure? Blood pressure and heart disease High blood pressure causes heart disease and increases the risk of stroke. This is more likely to develop in people who have high blood pressure readings or are overweight. Talk with your health care provider about your target blood pressure readings. Have your blood pressure checked: Every 3-5 years if you are 49-47 years of age. Every year if you are 19 years old or older. If you are between the ages of 15 and 38 and are a current or former smoker, ask your health care provider if you should have a one-time screening for abdominal aortic aneurysm (AAA). Diabetes Have regular diabetes screenings. This checks your fasting blood sugar level. Have the screening done: Once every three years after age 39 if you are at a normal weight and have a low risk for diabetes. More often and at a younger age if you are overweight or have a high risk for diabetes. What should I know about preventing infection? Hepatitis B If you have a higher risk for hepatitis B, you should be screened for this virus. Talk with your health care provider to find out if you are at risk for hepatitis B infection. Hepatitis C Blood testing is recommended for: Everyone born from [birth year] through 1963. Anyone with known risk  factors for hepatitis C. Sexually transmitted infections (STIs) You should be screened each year for STIs, including gonorrhea and chlamydia, if: You are sexually active and are younger than 75 years of age. You are older than 75 years of age and your health care provider tells you that you are at risk for this type of infection. Your sexual activity has changed since you were last screened, and you are at increased risk  for chlamydia or gonorrhea. Ask your health care provider if you are at risk. Ask your health care provider about whether you are at high risk for HIV. Your health care provider may recommend a prescription medicine to help prevent HIV infection. If you choose to take medicine to prevent HIV, you should first get tested for HIV. You should then be tested every 3 months for as long as you are taking the medicine. Follow these instructions at home: Alcohol use Do not drink alcohol if your health care provider tells you not to drink. If you drink alcohol: Limit how much you have to 0-2 drinks a day. Know how much alcohol is in your drink. In the [place] one drink equals one 12 oz bottle of beer (355 mL), one 5 oz glass of wine (148 mL), or one 1 oz glass of hard liquor (44 mL). Lifestyle Do not use any products that contain nicotine or tobacco. These products include cigarettes, chewing tobacco, and vaping devices, such as e-cigarettes. If you need help quitting, ask your health care provider. Do not use street drugs. Do not share needles. Ask your health care provider for help if you need support or information about quitting drugs. General instructions Schedule regular health, dental, and eye exams. Stay current with your vaccines. Tell your health care provider if: You often feel depressed. You have ever been abused or do not feel safe at home. Summary Adopting a healthy lifestyle and getting preventive care are important in promoting health and wellness. Follow your health care provider's instructions about healthy diet, exercising, and getting tested or screened for diseases. Follow your health care provider's instructions on monitoring your cholesterol and blood pressure. This information is not intended to replace advice given to you by your health care provider. Make sure you discuss any questions you have with your health care provider. Document Revised: 03/21/2021 Document Reviewed:  03/21/2021 Elsevier Patient Education  [place].

## 2023-02-12 NOTE — Progress Notes (Signed)
Subjective:    Patient ID: [name], male    DOB: Jul 21, 1946, 77 y.o.   MRN: [id number]     HPI [name] is here for a physical exam and his chronic medical problems.  Cough is still there - saw pulm.  Chest wall pain - with cough, movement, sneezing.  This is not new.  It is intermittent and he thinks the cough is improved a little bit which is making the pain better.     Medications and allergies reviewed with patient and updated if appropriate.  Current Outpatient Medications on File Prior to Visit  Medication Sig Dispense Refill   albuterol (VENTOLIN HFA) 108 (90 Base) MCG/ACT inhaler Inhale 1-2 puffs into the lungs every 6 (six) hours as needed. 8 g 2   allopurinol (ZYLOPRIM) 300 MG tablet TAKE 1/2 TABLET BY MOUTH DAILY 45 tablet 1   apixaban (ELIQUIS) 5 MG TABS tablet Take 1 tablet (5 mg total) by mouth 2 (two) times daily. 180 tablet 2   benzonatate (TESSALON) 200 MG capsule TAKE 1 CAPSULE BY MOUTH THREE TIMES A DAY AS NEEDED 45 capsule 1   famotidine (PEPCID) 20 MG tablet One after supper 30 tablet 11   gabapentin (NEURONTIN) 300 MG capsule Take 1 capsule (300 mg total) by mouth 3 (three) times daily. (Patient taking differently: Take 300 mg by mouth 2 (two) times daily.) 90 capsule 2   pantoprazole (PROTONIX) 40 MG tablet Take 1 tablet (40 mg total) by mouth daily. Take 30-60 min before first meal of the day 30 tablet 2   sildenafil (REVATIO) 20 MG tablet TAKE 3 TO 5 TABLETS BY MOUTH AS NEEDED 100 tablet 0   simvastatin (ZOCOR) 40 MG tablet TAKE 1 TABLET BY MOUTH DAILY AT 6 PM. 90 tablet 2   Tiotropium Bromide-Olodaterol (STIOLTO RESPIMAT) 2.5-2.5 MCG/ACT AERS Inhale 2 puffs into the lungs daily. 1 each 5   valsartan (DIOVAN) 80 MG tablet Take 1 tablet (80 mg total) by mouth daily. (Patient taking differently: Take 40 mg by mouth daily. Patient cuts 80 mg tablet in half  Takes 40 mg) 30 tablet 11   No current facility-administered medications on file prior to visit.     Review of Systems  Constitutional:  Negative for fever.  Eyes:  Negative for visual disturbance.  Respiratory:  Positive for cough (chronic dry). Negative for shortness of breath and wheezing.   Cardiovascular:  Positive for chest pain (msk chest pain- intermittent with cough, deep breaths, movement). Negative for palpitations and leg swelling.  Gastrointestinal:  Negative for abdominal pain, blood in stool, constipation, diarrhea and nausea.       No gerd  Genitourinary:  Positive for frequency. Negative for difficulty urinating and dysuria.  Musculoskeletal:  Positive for arthralgias and back pain.  Skin:  Negative for rash.  Neurological:  Positive for numbness (a little in feet until he gets up and moving) and headaches (controlled). Negative for dizziness and light-headedness.  Psychiatric/Behavioral:  Negative for dysphoric mood. The patient is not nervous/anxious.        Objective:   Vitals:   02/13/23 0817  BP: 116/72  Pulse: 80  Temp: 98 F (36.7 C)  SpO2: 98%   Filed Weights   02/13/23 0817  Weight: 230 lb (104.3 kg)   Body mass index is 31.19 kg/m.  BP Readings from Last 3 Encounters:  02/13/23 116/72  01/18/23 (!) 150/86  12/22/22 (!) 150/92    Wt Readings from Last 3 Encounters:  02/13/23 230 lb (104.3 kg)  01/18/23 226 lb (102.5 kg)  12/22/22 235 lb (106.6 kg)      Physical Exam Constitutional: He appears well-developed and well-nourished. No distress.  HENT:  Head: Normocephalic and atraumatic.  Right Ear: External ear normal.  Left Ear: External ear normal.  Normal ear canals and TM b/l  Mouth/Throat: Oropharynx is clear and moist. Eyes: Conjunctivae and EOM are normal.  Neck: Neck supple. No tracheal deviation present. No thyromegaly present.  No carotid bruit  Cardiovascular: Normal rate, regular rhythm, normal heart sounds and intact distal pulses.   No murmur heard.  No lower extremity edema. Pulmonary/Chest: Effort normal and breath  sounds normal. No respiratory distress. He has no wheezes. He has no rales.  Abdominal: Soft. He exhibits no distension. There is no tenderness.  Genitourinary: deferred  Lymphadenopathy:   He has no cervical adenopathy.  Skin: Skin is warm and dry. He is not diaphoretic.  Psychiatric: He has a normal mood and affect. His behavior is normal.         Assessment & Plan:   Physical exam: Screening blood work  ordered Exercise   none - limited by pain.  Weight  would benefit from weight loss Substance abuse   none   Reviewed recommended immunizations.   Health Maintenance  Topic Date Due   COVID-19 Vaccine (7 - 2023-24 season) 03/01/2023 (Originally 10/20/2022)   COLONOSCOPY (Pts 45-24yrs Insurance coverage will need to be confirmed)  08/10/2023 (Originally 12/15/2020)   INFLUENZA VACCINE  06/14/2023   Medicare Annual Wellness (AWV)  09/26/2023   DTaP/Tdap/Td (3 - Td or Tdap) 12/15/2032   Pneumonia Vaccine 50+ Years old  Completed   Hepatitis C Screening  Completed   Zoster Vaccines- Shingrix  Completed   HPV VACCINES  Aged Out   Lung Cancer Screening  Discontinued    Requested psa for prostate cancer screening.   Does not want another colonoscopy  See Problem List for Assessment and Plan of chronic medical problems.

## 2023-02-13 ENCOUNTER — Ambulatory Visit (INDEPENDENT_AMBULATORY_CARE_PROVIDER_SITE_OTHER): Payer: PPO | Admitting: Internal Medicine

## 2023-02-13 ENCOUNTER — Encounter: Payer: Self-pay | Admitting: Internal Medicine

## 2023-02-13 VITALS — BP 116/72 | HR 80 | Temp 98.0°F | Ht 72.0 in | Wt 230.0 lb

## 2023-02-13 DIAGNOSIS — R972 Elevated prostate specific antigen [PSA]: Secondary | ICD-10-CM | POA: Diagnosis not present

## 2023-02-13 DIAGNOSIS — Z0000 Encounter for general adult medical examination without abnormal findings: Secondary | ICD-10-CM

## 2023-02-13 DIAGNOSIS — R519 Headache, unspecified: Secondary | ICD-10-CM | POA: Diagnosis not present

## 2023-02-13 DIAGNOSIS — Z125 Encounter for screening for malignant neoplasm of prostate: Secondary | ICD-10-CM | POA: Diagnosis not present

## 2023-02-13 DIAGNOSIS — M1A00X0 Idiopathic chronic gout, unspecified site, without tophus (tophi): Secondary | ICD-10-CM

## 2023-02-13 DIAGNOSIS — R053 Chronic cough: Secondary | ICD-10-CM | POA: Diagnosis not present

## 2023-02-13 DIAGNOSIS — E7849 Other hyperlipidemia: Secondary | ICD-10-CM | POA: Diagnosis not present

## 2023-02-13 DIAGNOSIS — Z0001 Encounter for general adult medical examination with abnormal findings: Secondary | ICD-10-CM

## 2023-02-13 DIAGNOSIS — I483 Typical atrial flutter: Secondary | ICD-10-CM | POA: Diagnosis not present

## 2023-02-13 DIAGNOSIS — I10 Essential (primary) hypertension: Secondary | ICD-10-CM | POA: Diagnosis not present

## 2023-02-13 DIAGNOSIS — R739 Hyperglycemia, unspecified: Secondary | ICD-10-CM

## 2023-02-13 DIAGNOSIS — G8929 Other chronic pain: Secondary | ICD-10-CM

## 2023-02-13 DIAGNOSIS — I4892 Unspecified atrial flutter: Secondary | ICD-10-CM | POA: Insufficient documentation

## 2023-02-13 DIAGNOSIS — I700 Atherosclerosis of aorta: Secondary | ICD-10-CM | POA: Diagnosis not present

## 2023-02-13 DIAGNOSIS — R7989 Other specified abnormal findings of blood chemistry: Secondary | ICD-10-CM

## 2023-02-13 LAB — HEMOGLOBIN A1C: Hgb A1c MFr Bld: 5.4 % (ref 4.6–6.5)

## 2023-02-13 LAB — LIPID PANEL
Cholesterol: 185 mg/dL (ref 0–200)
HDL: 65.4 mg/dL (ref >39.00)
LDL Cholesterol: 101 mg/dL — ABNORMAL HIGH (ref 0–99)
NonHDL: 119.58
Total CHOL/HDL Ratio: 3
Triglycerides: 93 mg/dL (ref 0.0–149.0)
VLDL: 18.6 mg/dL (ref 0.0–40.0)

## 2023-02-13 LAB — CBC WITH DIFFERENTIAL/PLATELET
Basophils Absolute: 0.1 10*3/uL (ref 0.0–0.1)
Basophils Relative: 0.9 % (ref 0.0–3.0)
Eosinophils Absolute: 0.1 10*3/uL (ref 0.0–0.7)
Eosinophils Relative: 2.1 % (ref 0.0–5.0)
HCT: 46.9 % (ref 39.0–52.0)
Hemoglobin: 15.8 g/dL (ref 13.0–17.0)
Lymphocytes Relative: 35 % (ref 12.0–46.0)
Lymphs Abs: 2.1 10*3/uL (ref 0.7–4.0)
MCHC: 33.6 g/dL (ref 30.0–36.0)
MCV: 106.3 fl — ABNORMAL HIGH (ref 78.0–100.0)
Monocytes Absolute: 0.6 10*3/uL (ref 0.1–1.0)
Monocytes Relative: 9.5 % (ref 3.0–12.0)
Neutro Abs: 3.2 10*3/uL (ref 1.4–7.7)
Neutrophils Relative %: 52.5 % (ref 43.0–77.0)
Platelets: 195 10*3/uL (ref 150.0–400.0)
RBC: 4.42 Mil/uL (ref 4.22–5.81)
RDW: 14.8 % (ref 11.5–15.5)
WBC: 6.1 10*3/uL (ref 4.0–10.5)

## 2023-02-13 LAB — PSA, MEDICARE: PSA: 7.85 ng/ml — ABNORMAL HIGH (ref 0.10–4.00)

## 2023-02-13 LAB — COMPREHENSIVE METABOLIC PANEL
ALT: 105 U/L — ABNORMAL HIGH (ref 0–53)
AST: 94 U/L — ABNORMAL HIGH (ref 0–37)
Albumin: 4.5 g/dL (ref 3.5–5.2)
Alkaline Phosphatase: 48 U/L (ref 39–117)
BUN: 17 mg/dL (ref 6–23)
CO2: 29 mEq/L (ref 19–32)
Calcium: 10.2 mg/dL (ref 8.4–10.5)
Chloride: 99 mEq/L (ref 96–112)
Creatinine, Ser: 1.26 mg/dL (ref 0.40–1.50)
GFR: 55.94 mL/min — ABNORMAL LOW (ref >60.00)
Glucose, Bld: 99 mg/dL (ref 70–99)
Potassium: 4.8 mEq/L (ref 3.5–5.1)
Sodium: 137 mEq/L (ref 135–145)
Total Bilirubin: 0.6 mg/dL (ref 0.2–1.2)
Total Protein: 7 g/dL (ref 6.0–8.3)

## 2023-02-13 MED ORDER — SILDENAFIL CITRATE 20 MG PO TABS: ORAL_TABLET | ORAL | 0 refills | Status: DC

## 2023-02-13 MED ORDER — SIMVASTATIN 40 MG PO TABS: 40.0000 mg | ORAL_TABLET | Freq: Every day | ORAL | 2 refills | Status: DC

## 2023-02-13 NOTE — Assessment & Plan Note (Signed)
Chronic On simvastatin 40 mg daily Check lipid panel, CMP Encouraged regular exercise

## 2023-02-13 NOTE — Assessment & Plan Note (Signed)
Chronic Check a1c Low sugar / carb diet Stressed regular exercise  

## 2023-02-13 NOTE — Assessment & Plan Note (Signed)
Chronic Dry Has seen pulm Changing losartan to valsartan has not helped Gabapentin 300 mg bid did not helped Taking pantoprazole 40 mg daily Albuterol - doing this Q 6 hrs - this does help Stiolto - stopped using it - difficulty using PFTs this friday

## 2023-02-13 NOTE — Assessment & Plan Note (Signed)
Chronic Taking gabapentin 300 mg bid Headaches controlled Continue above

## 2023-02-13 NOTE — Assessment & Plan Note (Signed)
Seen on PPM Cardio started eliquis

## 2023-02-13 NOTE — Assessment & Plan Note (Addendum)
Chronic Regular exercise and healthy diet encouraged Check lipid panel  Continue simvastatin 40 mg daily-we have not checked his cholesterol since increasing his dose  Lab Results  Component Value Date   LDLCALC 79 08/09/2022

## 2023-02-13 NOTE — Assessment & Plan Note (Signed)
Chronic No issues with gout recently-controlled Continue allopurinol 150 mg daily

## 2023-02-13 NOTE — Assessment & Plan Note (Signed)
Chronic Blood pressure controlled CMP Continue valsartan 40 mg daily-changed by pulmonary from losartan to see if that helps with his cough

## 2023-02-14 ENCOUNTER — Other Ambulatory Visit: Payer: Self-pay

## 2023-02-14 DIAGNOSIS — R0609 Other forms of dyspnea: Secondary | ICD-10-CM

## 2023-02-14 DIAGNOSIS — R911 Solitary pulmonary nodule: Secondary | ICD-10-CM

## 2023-02-14 NOTE — Addendum Note (Signed)
Addended by: Binnie Rail on: 02/14/2023 07:40 AM   Modules accepted: Orders

## 2023-02-15 ENCOUNTER — Ambulatory Visit (INDEPENDENT_AMBULATORY_CARE_PROVIDER_SITE_OTHER): Payer: PPO | Admitting: Adult Health

## 2023-02-15 ENCOUNTER — Ambulatory Visit (INDEPENDENT_AMBULATORY_CARE_PROVIDER_SITE_OTHER): Payer: PPO | Admitting: Internal Medicine

## 2023-02-15 ENCOUNTER — Encounter: Payer: Self-pay | Admitting: Adult Health

## 2023-02-15 VITALS — BP 138/78 | HR 68 | Resp 99 | Ht 77.0 in | Wt 233.0 lb

## 2023-02-15 DIAGNOSIS — R0609 Other forms of dyspnea: Secondary | ICD-10-CM | POA: Diagnosis not present

## 2023-02-15 DIAGNOSIS — R0781 Pleurodynia: Secondary | ICD-10-CM

## 2023-02-15 DIAGNOSIS — R911 Solitary pulmonary nodule: Secondary | ICD-10-CM | POA: Diagnosis not present

## 2023-02-15 DIAGNOSIS — R058 Other specified cough: Secondary | ICD-10-CM | POA: Diagnosis not present

## 2023-02-15 LAB — PULMONARY FUNCTION TEST
DL/VA % pred: 102 %
DL/VA: 3.95 ml/min/mmHg/L
DLCO cor % pred: 86 %
DLCO cor: 26.82 ml/min/mmHg
DLCO unc % pred: 89 %
DLCO unc: 27.68 ml/min/mmHg
FEF 25-75 Post: 2.09 L/sec
FEF 25-75 Pre: 2.8 L/sec
FEF2575-%Change-Post: -25 %
FEF2575-%Pred-Post: 71 %
FEF2575-%Pred-Pre: 96 %
FEV1-%Change-Post: -21 %
FEV1-%Pred-Post: 63 %
FEV1-%Pred-Pre: 80 %
FEV1-Post: 2.54 L
FEV1-Pre: 3.23 L
FEV1FVC-%Change-Post: -20 %
FEV1FVC-%Pred-Pre: 102 %
FEV6-%Change-Post: -1 %
FEV6-%Pred-Post: 82 %
FEV6-%Pred-Pre: 83 %
FEV6-Post: 4.27 L
FEV6-Pre: 4.31 L
FEV6FVC-%Change-Post: 0 %
FEV6FVC-%Pred-Post: 105 %
FEV6FVC-%Pred-Pre: 105 %
FVC-%Change-Post: 0 %
FVC-%Pred-Post: 78 %
FVC-%Pred-Pre: 79 %
FVC-Post: 4.32 L
FVC-Pre: 4.35 L
Post FEV1/FVC ratio: 59 %
Post FEV6/FVC ratio: 100 %
Pre FEV1/FVC ratio: 74 %
Pre FEV6/FVC Ratio: 100 %
RV % pred: 106 %
RV: 3.13 L
TLC % pred: 91 %
TLC: 7.7 L

## 2023-02-15 NOTE — Assessment & Plan Note (Signed)
Chronic cough over the last year.  May have a component of some reactive airways.  PFTs show no significant airflow obstruction.  May discontinue to [place].  Had no perceived benefits and no significant COPD on PFTs.  High-resolution CT chest shows no significant interstitial lung disease.  PFTs show normal diffusing capacity.   Plan  Patient Instructions  Can stop Stiolto 2 puffs daily Albuterol inhaler As needed   Delsym 2 tsp Twice daily  for cough As needed  (OTC ) Tessalon Three times a day  for cough as needed  Warm heat to ribs Twice daily   Claritin 10mg  daily in am (OTC)  Chlorpheniramine 4mg  At bedtime  (OTC)  Continue on Prilosec and Pepcid  Sips of water to soothe throat , no MINTS.  Follow up in 3- 4 months with Dr. Melvyn Novas or Kewan Mcnease NP and As needed   Please contact office for sooner follow up if symptoms do not improve or worsen or seek emergency care    '

## 2023-02-15 NOTE — Progress Notes (Signed)
@Patient  ID: [name], male    DOB: September 27, 1946, 77 y.o.   MRN: [id number]  Chief Complaint  Patient presents with   Follow-up    Referring provider: Binnie Rail, MD  HPI: 77 year old male former smoker seen for pulmonary consult November 2023 for chronic cough since January 2023.  TEST/EVENTS :   02/15/2023 Follow up ; Chronic cough  Patient presents for a 90-month follow-up.  Patient was seen last visit for a ongoing chronic cough this been present since the beginning of 2023.  Describes as a dry minimally productive cough.  Also has some pleuritic left rib pain that is worse with coughing and movement.  Patient has been tried on increased gabapentin dose without significant improvement.  Last visit patient was started on Stiolto for possible COPD component as he has a history of heavy smoking.  Started on Claritin and Chlor-Trimeton.  Also given albuterol inhaler.  He was continued on Prilosec and Pepcid.  High-resolution CT chest December 15, 2022 showed very subtle subpleural reticulations with no clear craniocaudal predominance.  Indeterminate for UIP.  Since last visit patient says that he is some better.  He feels like his cough is better.  He also is having some improvement in his left rib pain as he is going to physical therapy.  Patient did not like Stiolto.  He is only using albuterol as needed.  Patient was given prednisone however says he did not take it does not like taking steroids. PFTs today show no significant airflow obstruction and only minimal restriction.  FEV1 is 80%, ratio 74, FVC a 79%, DLCO 89%.   No Known Allergies  Immunization History  Administered Date(s) Administered   COVID-19, mRNA, vaccine(Comirnaty)12 years and older 08/25/2022   Fluad Quad(high Dose 65+) 08/05/2019, 08/05/2020, 08/04/2021, 08/09/2022   Influenza Split 08/29/2011, 08/20/2012   Influenza Whole 10/03/2007, 08/13/2008, 08/18/2009, 09/12/2010   Influenza, High Dose Seasonal PF  08/26/2013, 09/14/2016, 07/26/2017, 08/15/2018   Influenza,inj,Quad PF,6+ Mos 09/08/2014, 08/31/2015   PFIZER Comirnaty(Gray Top)Covid-19 Tri-Sucrose Vaccine 03/23/2021, 08/25/2022   PFIZER(Purple Top)SARS-COV-2 Vaccination 12/28/2019, 01/20/2020, 09/25/2020   Pfizer Covid-19 Vaccine Bivalent Booster 67yrs & up 08/16/2021   Pneumococcal Conjugate-13 07/14/2015   Pneumococcal Polysaccharide-23 10/02/2011, 09/02/2013   Respiratory Syncytial Virus Vaccine,Recomb Aduvanted(Arexvy) 09/27/2022   Tdap 08/20/2012, 12/15/2022   Zoster Recombinat (Shingrix) 12/11/2017, 02/11/2018   Zoster, Live 12/29/2009    Past Medical History:  Diagnosis Date   Complete heart block    S/P  PPM by Dr Rayann Heman   Coronary artery disease    Non-obstructive. Pt had left heart catheterization in August 2009 showing a 30% mid-circumflex lesion, otherwise coronaries were clean angiographically   Hyperlipidemia    Hypertension    OSA (obstructive sleep apnea)    Probable; study never scheduled    Tobacco History: Social History   Tobacco Use  Smoking Status Former   Packs/day: 2.00   Years: 30.00   Additional pack years: 0.00   Total pack years: 60.00   Types: Cigarettes   Quit date: 11/14/2003   Years since quitting: 19.2  Smokeless Tobacco Never  Tobacco Comments   smoked 1965- 2007, up to 2 ppd   Counseling given: Not Answered Tobacco comments: smoked 1965- 2007, up to 2 ppd   Outpatient Medications Prior to Visit  Medication Sig Dispense Refill   albuterol (VENTOLIN HFA) 108 (90 Base) MCG/ACT inhaler Inhale 1-2 puffs into the lungs every 6 (six) hours as needed. 8 g 2   allopurinol (ZYLOPRIM) 300  MG tablet TAKE 1/2 TABLET BY MOUTH DAILY 45 tablet 1   apixaban (ELIQUIS) 5 MG TABS tablet Take 1 tablet (5 mg total) by mouth 2 (two) times daily. 180 tablet 2   benzonatate (TESSALON) 200 MG capsule TAKE 1 CAPSULE BY MOUTH THREE TIMES A DAY AS NEEDED 45 capsule 1   famotidine (PEPCID) 20 MG tablet One  after supper 30 tablet 11   gabapentin (NEURONTIN) 300 MG capsule Take 1 capsule (300 mg total) by mouth 3 (three) times daily. (Patient taking differently: Take 300 mg by mouth 2 (two) times daily.) 90 capsule 2   pantoprazole (PROTONIX) 40 MG tablet Take 1 tablet (40 mg total) by mouth daily. Take 30-60 min before first meal of the day 30 tablet 2   sildenafil (REVATIO) 20 MG tablet TAKE 3 TO 5 TABLETS BY MOUTH AS NEEDED 100 tablet 0   simvastatin (ZOCOR) 40 MG tablet Take 1 tablet (40 mg total) by mouth daily at 6 PM. 90 tablet 2   Tiotropium Bromide-Olodaterol (STIOLTO RESPIMAT) 2.5-2.5 MCG/ACT AERS Inhale 2 puffs into the lungs daily. 1 each 5   valsartan (DIOVAN) 80 MG tablet Take 1 tablet (80 mg total) by mouth daily. (Patient taking differently: Take 40 mg by mouth daily. Patient cuts 80 mg tablet in half  Takes 40 mg) 30 tablet 11   No facility-administered medications prior to visit.     Review of Systems:   Constitutional:   No  weight loss, night sweats,  Fevers, chills, fatigue, or  lassitude.  HEENT:   No headaches,  Difficulty swallowing,  Tooth/dental problems, or  Sore throat,                No sneezing, itching, ear ache,  +nasal congestion, post nasal drip,   CV:  No chest pain,  Orthopnea, PND, swelling in lower extremities, anasarca, dizziness, palpitations, syncope.   GI  No heartburn, indigestion, abdominal pain, nausea, vomiting, diarrhea, change in bowel habits, loss of appetite, bloody stools.   Resp:   No chest wall deformity  Skin: no rash or lesions.  GU: no dysuria, change in color of urine, no urgency or frequency.  No flank pain, no hematuria   MS:  No joint pain or swelling.  No decreased range of motion.  No back pain.    Physical Exam  BP 138/78 (BP Location: Left Arm, Patient Position: Sitting, Cuff Size: Normal)   Pulse 68   Resp (!) 99   Ht 6\' 5"  (1.956 m)   Wt 233 lb (105.7 kg)   BMI 27.63 kg/m   GEN: A/Ox3; pleasant , NAD, well  nourished    HEENT:  [place]/AT,  NOSE-clear, THROAT-clear, no lesions, no postnasal drip or exudate noted.   NECK:  Supple w/ fair ROM; no JVD; normal carotid impulses w/o bruits; no thyromegaly or nodules palpated; no lymphadenopathy.    RESP  Clear  P & A; w/o, wheezes/ rales/ or rhonchi. no accessory muscle use, no dullness to percussion  CARD:  RRR, no m/r/g, tr Peripheral edema, pulses intact, no cyanosis or clubbing.  GI:   Soft & nt; nml bowel sounds; no organomegaly or masses detected.   Musco: Warm bil, no deformities or joint swelling noted.   Neuro: alert, no focal deficits noted.    Skin: Warm, no lesions or rashes    Lab Results:  CBC   BMET   BNP No results found for: "BNP"  ProBNP No results found for: "PROBNP"  Imaging:  No results found.       Latest Ref Rng & Units 02/14/2023    9:11 AM  PFT Results  FVC-Pre L 4.35  P  FVC-Predicted Pre % 79  P  FVC-Post L 4.32  P  FVC-Predicted Post % 78  P  Pre FEV1/FVC % % 74  P  Post FEV1/FCV % % 59  P  FEV1-Pre L 3.23  P  FEV1-Predicted Pre % 80  P  FEV1-Post L 2.54  P  DLCO uncorrected ml/min/mmHg 27.68  P  DLCO UNC% % 89  P  DLCO corrected ml/min/mmHg 26.82  P  DLCO COR %Predicted % 86  P  DLVA Predicted % 102  P  TLC L 7.70  P  TLC % Predicted % 91  P  RV % Predicted % 106  P    P Preliminary result    No results found for: "NITRICOXIDE"      Assessment & Plan:   Upper airway cough syndrome Chronic cough over the last year.  May have a component of some reactive airways.  PFTs show no significant airflow obstruction.  May discontinue to [place].  Had no perceived benefits and no significant COPD on PFTs.  High-resolution CT chest shows no significant interstitial lung disease.  PFTs show normal diffusing capacity.   Plan  Patient Instructions  Can stop Stiolto 2 puffs daily Albuterol inhaler As needed   Delsym 2 tsp Twice daily  for cough As needed  (OTC ) Tessalon Three times a day   for cough as needed  Warm heat to ribs Twice daily   Claritin 10mg  daily in am (OTC)  Chlorpheniramine 4mg  At bedtime  (OTC)  Continue on Prilosec and Pepcid  Sips of water to soothe throat , no MINTS.  Follow up in 3- 4 months with Dr. Melvyn Novas or Irish Piech NP and As needed   Please contact office for sooner follow up if symptoms do not improve or worsen or seek emergency care    '   Rib pain on left side Left-sided pleuritic pain-seems to be getting better with physical therapy and cough suppression.  Continue with warm heat and activity as tolerated.  May have a component of some costochondritis.  Nothing on CT scan to explain symptoms    Rexene Edison, NP 02/15/2023

## 2023-02-15 NOTE — Progress Notes (Signed)
Full PFT performed today. °

## 2023-02-15 NOTE — Patient Instructions (Signed)
Full PFT performed today. °

## 2023-02-15 NOTE — Assessment & Plan Note (Signed)
Left-sided pleuritic pain-seems to be getting better with physical therapy and cough suppression.  Continue with warm heat and activity as tolerated.  May have a component of some costochondritis.  Nothing on CT scan to explain symptoms

## 2023-02-15 NOTE — Patient Instructions (Addendum)
Can stop Stiolto 2 puffs daily Albuterol inhaler As needed   Delsym 2 tsp Twice daily  for cough As needed  (OTC ) Tessalon Three times a day  for cough as needed  Warm heat to ribs Twice daily   Claritin 10mg  daily in am (OTC)  Chlorpheniramine 4mg  At bedtime  (OTC)  Continue on Prilosec and Pepcid  Sips of water to soothe throat , no MINTS.  Follow up in 3- 4 months with Dr. Melvyn Novas or Fantasia Jinkins NP and As needed   Please contact office for sooner follow up if symptoms do not improve or worsen or seek emergency care

## 2023-02-18 ENCOUNTER — Encounter: Payer: Self-pay | Admitting: Internal Medicine

## 2023-02-20 ENCOUNTER — Telehealth: Payer: Self-pay | Admitting: *Deleted

## 2023-02-20 NOTE — Telephone Encounter (Signed)
Nyoka Cowden, MD  Christen Butter, CMA Call patient :  [name] is c/w very mild copd no need for any rx x d/c all smoking  Be sure patient has/keeps f/u ov so we can go over all the details of this study and get a plan together moving forward - ok to move up f/u if not feeling better and wants to be seen sooner  Spoke with pt and notified of results per Dr. Sherene Sires. Pt verbalized understanding and denied any questions.

## 2023-02-20 NOTE — Progress Notes (Signed)
Called the pt and there was no answer- LMTCB    

## 2023-02-20 NOTE — Telephone Encounter (Signed)
Patient returning call from [place] about results from scans. Please call and advise patient [phone]

## 2023-03-03 ENCOUNTER — Other Ambulatory Visit: Payer: Self-pay | Admitting: Internal Medicine

## 2023-03-03 IMAGING — DX DG HIP (WITH OR WITHOUT PELVIS) 2-3V*R*
3 series · 3 of 3 positions shown · non-contrast
Comparison: None.

CLINICAL DATA: Right-sided pain

EXAM:
DG HIP (WITH OR WITHOUT PELVIS) 2-3V RIGHT

[pelvis ap]
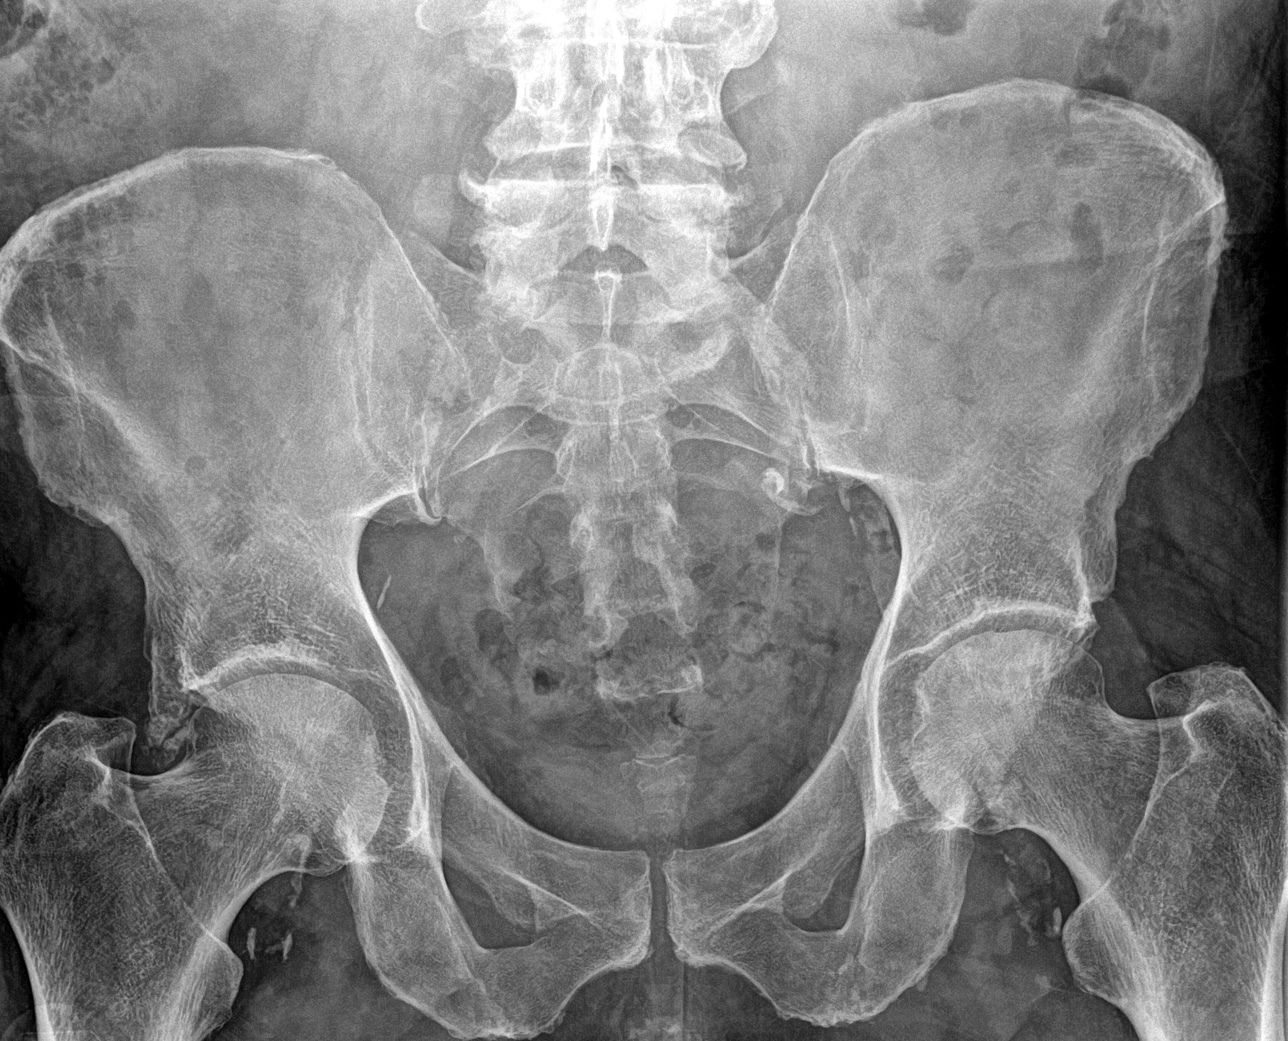

[hip ap]
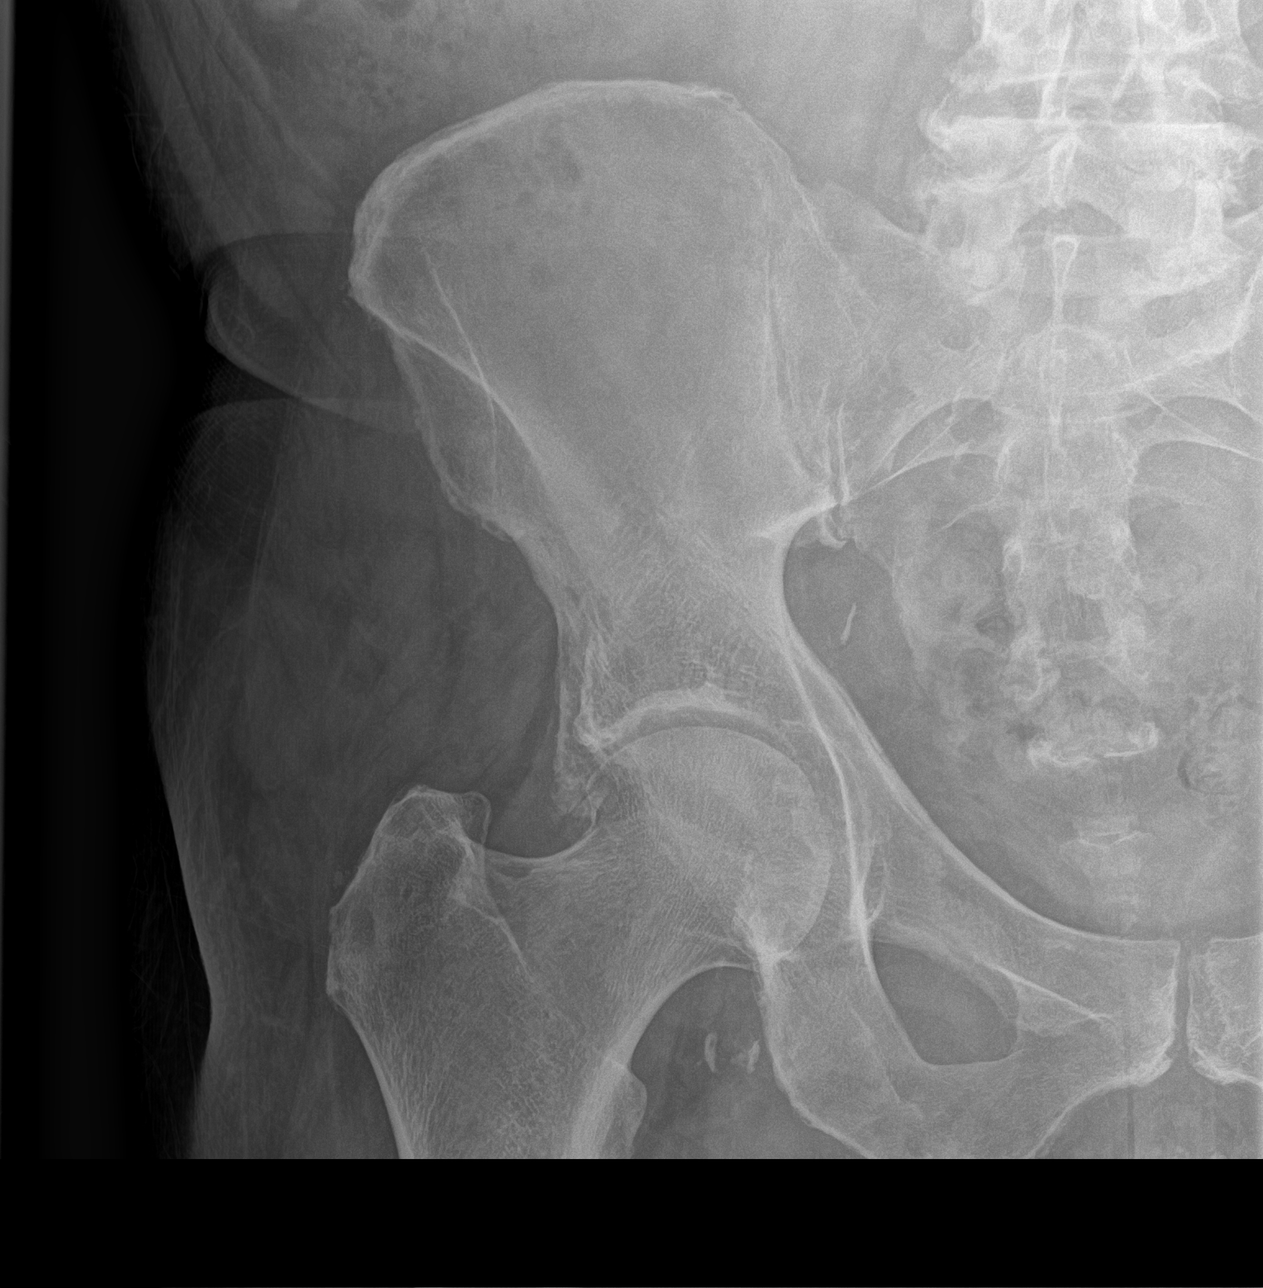

[hip frog leg]
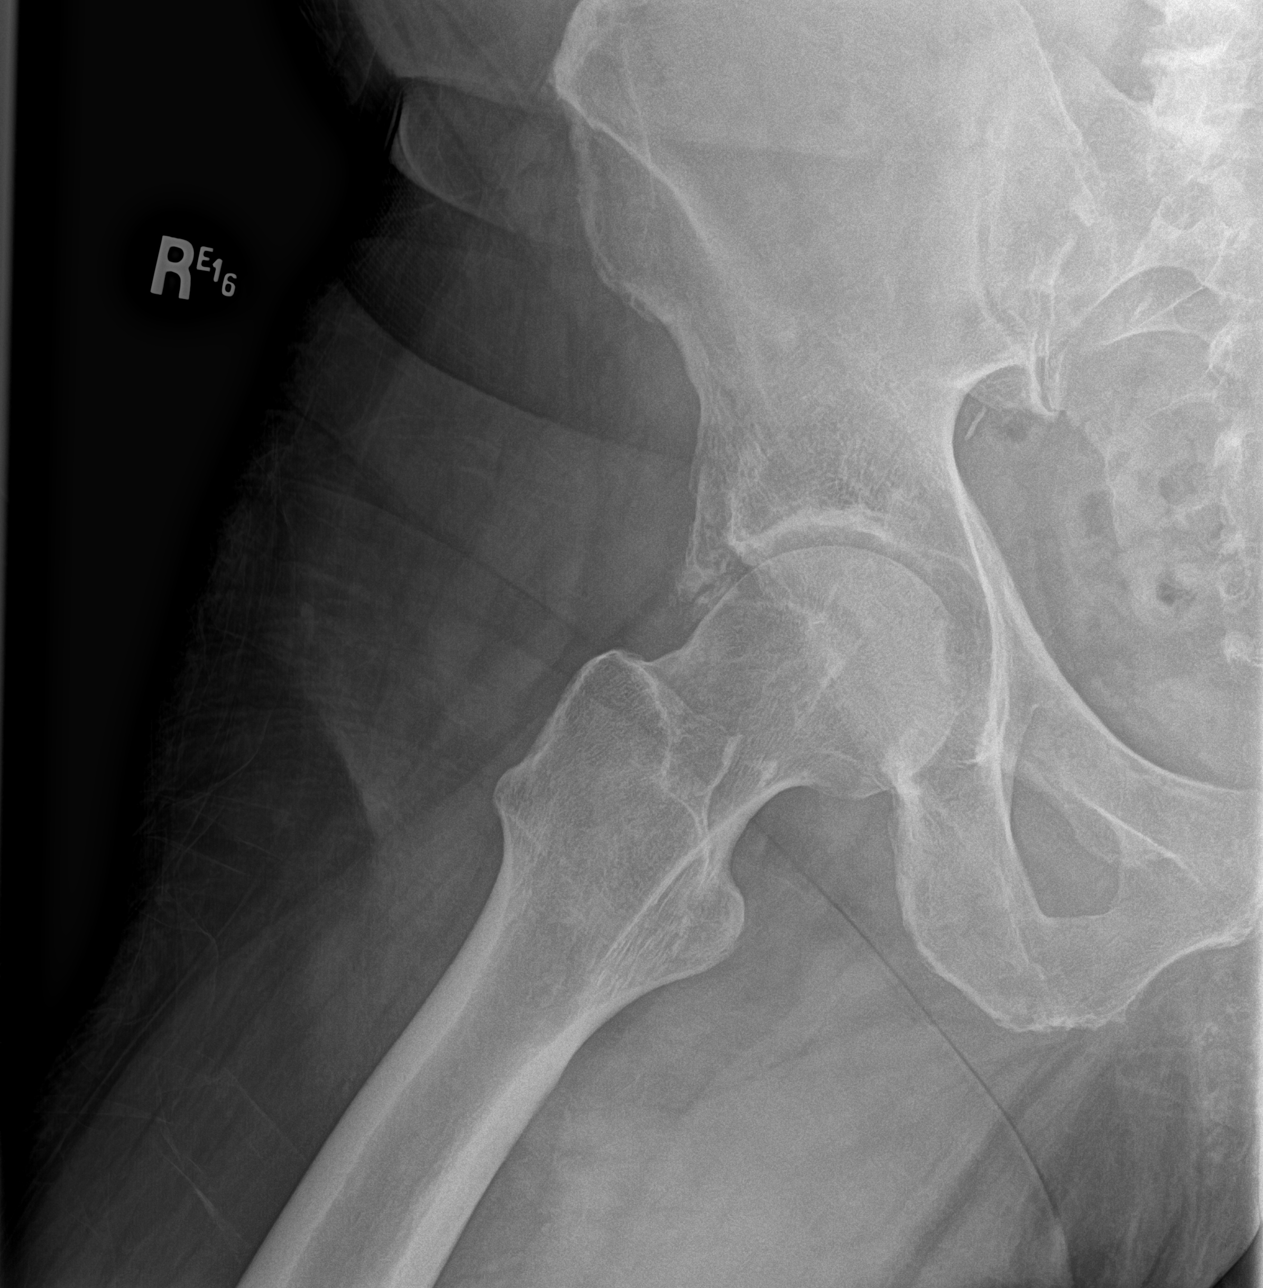

[3 of 3 positions shown; findings below may reference images not displayed]

FINDINGS: Frontal pelvis as well as frontal and lateral right hip images were
obtained. No evident fracture or dislocation. There is moderate
symmetric narrowing of each hip joint. There is rather marked bony
overgrowth along the superolateral aspect of the right acetabulum.
No erosive change. There are multiple foci of calcification in the
iliac and femoral arteries bilaterally.
IMPRESSION: Moderate symmetric narrowing of each hip joint. Rather marked bony
overgrowth along the superolateral right acetabulum, a finding that
places patient at increased risk for potential femoroacetabular
impingement.

No acute fracture or dislocation. Multiple foci of atherosclerotic
arterial vascular calcification.

## 2023-03-03 IMAGING — DX DG LUMBAR SPINE COMPLETE 4+V
5 series · 5 of 5 positions shown · non-contrast
Comparison: None.

CLINICAL DATA: Low back pain

EXAM:
LUMBAR SPINE - COMPLETE 4+ VIEW

[l-spine ap]
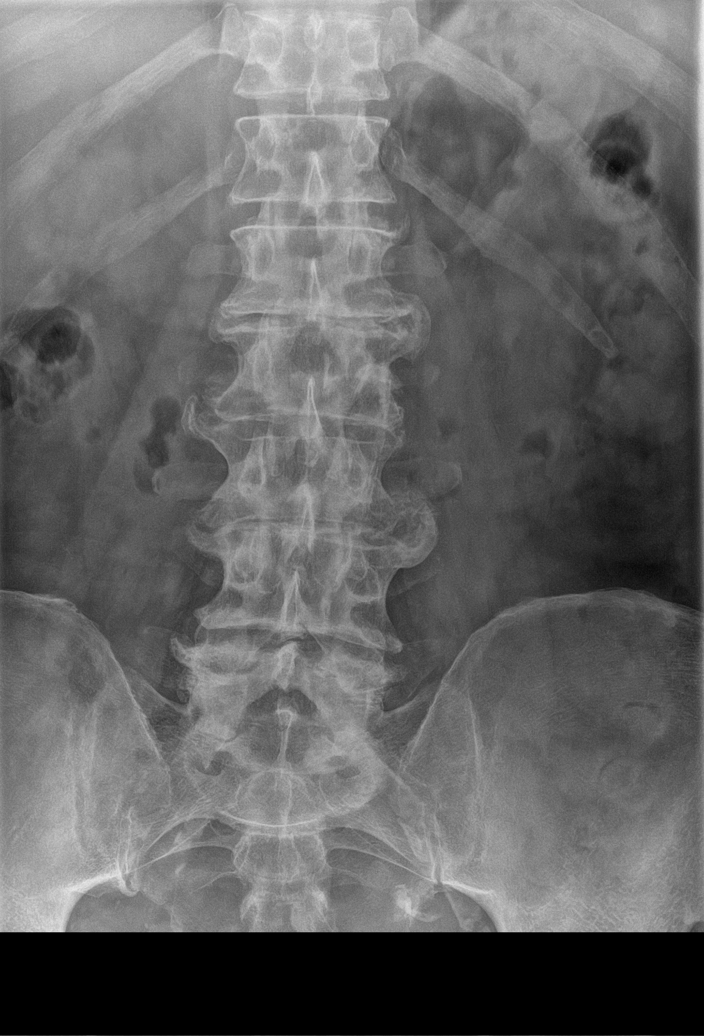

[l-spine obl (1 of 2)]
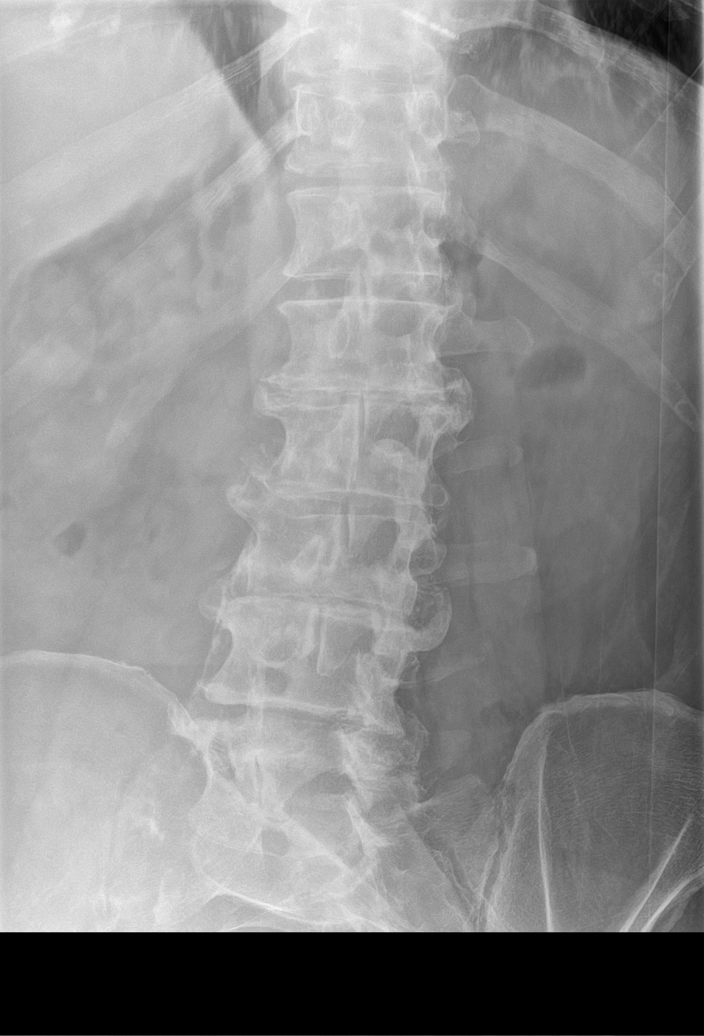

[l-spine obl (2 of 2)]
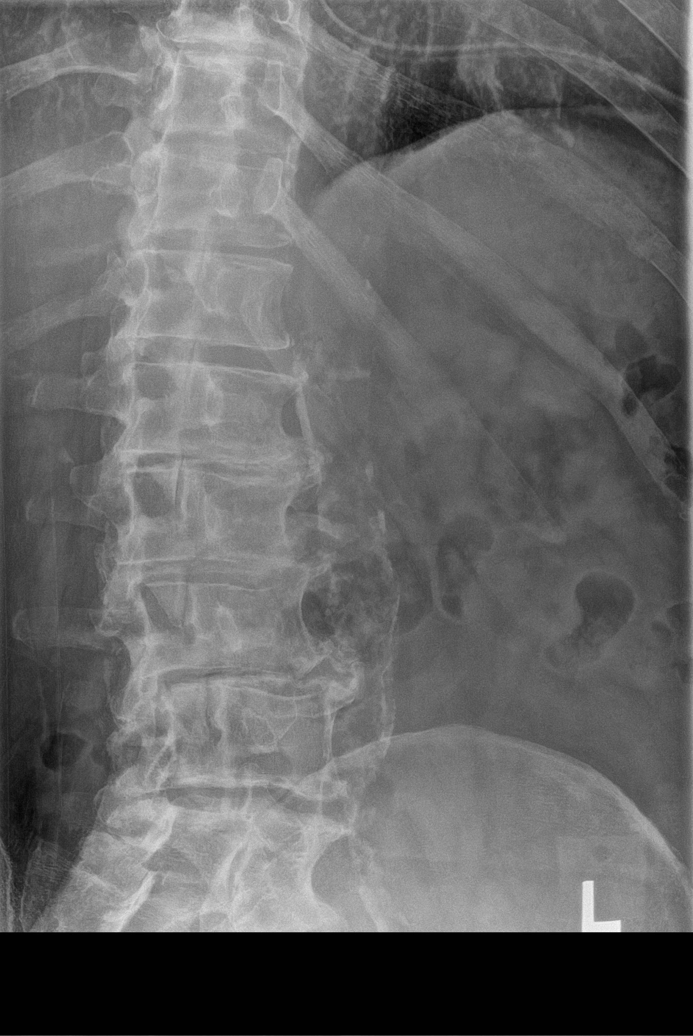

[l-spine lateral]
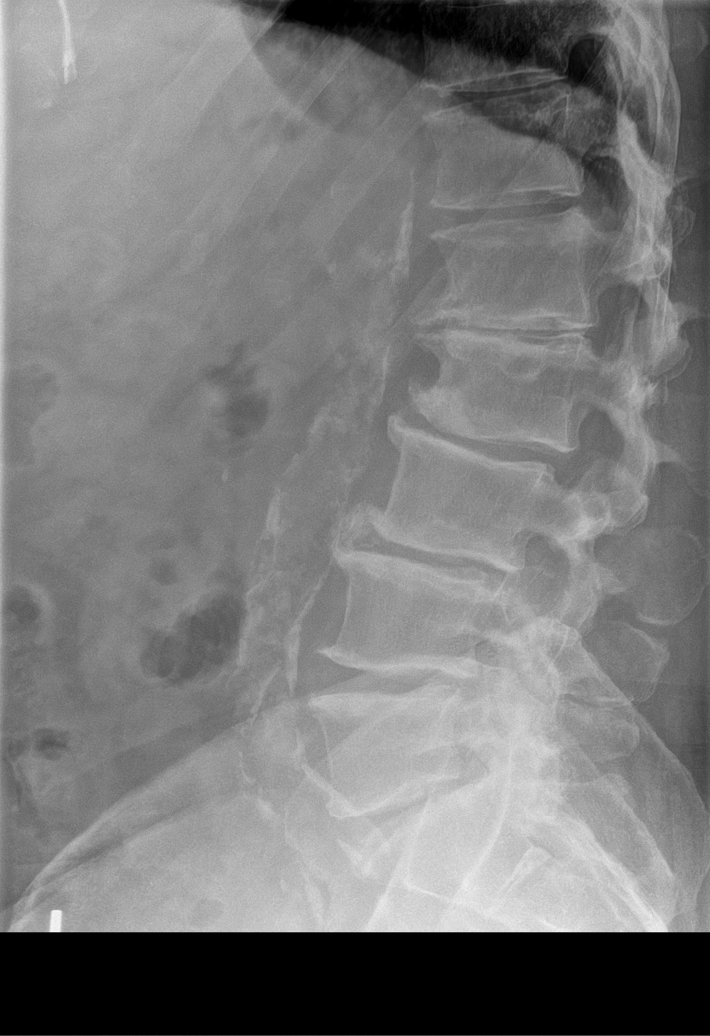

[l-spine spot]
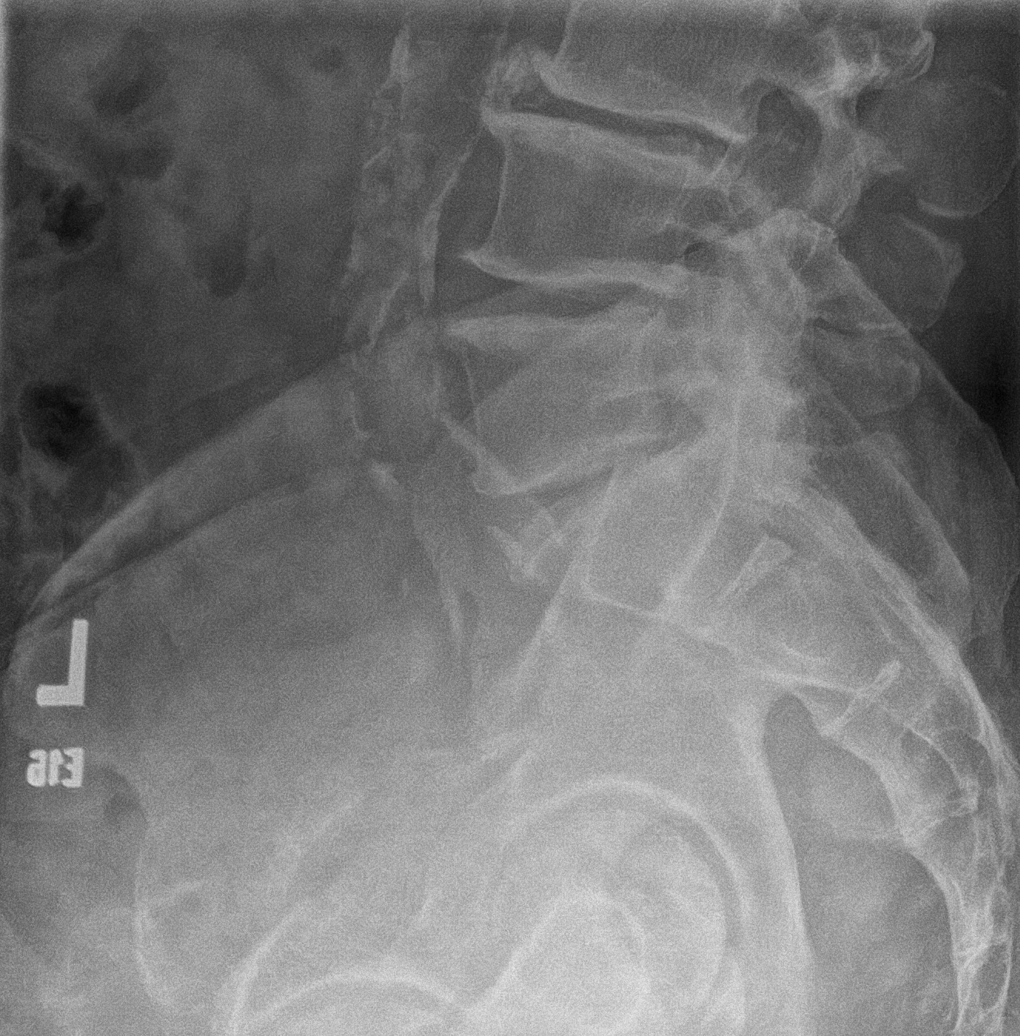

[5 of 5 positions shown; findings below may reference images not displayed]

FINDINGS: Frontal, lateral, spot lumbosacral lateral, and bilateral oblique
views were obtained. There are 5 non-rib-bearing lumbar type
vertebral bodies. There is slight lumbar levoscoliosis. There is no
fracture. There is 2 mm of retrolisthesis of L2 on L3. There is 5 mm
of retrolisthesis of L3 on L4. There is 2 mm of retrolisthesis of L4
on L5. There is 2 mm of anterolisthesis of L5 on S1. There is
moderately severe disc space narrowing at L1-2 with moderate disc
space narrowing at all other levels. There is facet hypertrophy at
all levels bilaterally with facet osteoarthritic change most notable
at L5-S1 bilaterally. There is aortic atherosclerosis.
IMPRESSION: Multilevel osteoarthritic change. No acute fracture.
Spondylolisthesis at several levels is likely due to underlying
spondylosis. Slight scoliosis.

Aortic Atherosclerosis ([id number]-GX8.8).

## 2023-03-13 NOTE — Progress Notes (Unsigned)
Subjective:    Patient ID: [name], male    DOB: 22-Mar-1946, 77 y.o.   MRN: [id number]     HPI Favor is here for follow up of his chronic medical problems.  Psa elevated last blood work - referred to urology.  Liver tests were elevated.  He is taking some of the medications pulmonary prescribed them all of them.   Medications and allergies reviewed with patient and updated if appropriate.  Current Outpatient Medications on File Prior to Visit  Medication Sig Dispense Refill   albuterol (VENTOLIN HFA) 108 (90 Base) MCG/ACT inhaler Inhale 1-2 puffs into the lungs every 6 (six) hours as needed. 8 g 2   allopurinol (ZYLOPRIM) 300 MG tablet TAKE 1/2 TABLET BY MOUTH DAILY 45 tablet 1   apixaban (ELIQUIS) 5 MG TABS tablet Take 1 tablet (5 mg total) by mouth 2 (two) times daily. 180 tablet 2   benzonatate (TESSALON) 200 MG capsule TAKE 1 CAPSULE BY MOUTH THREE TIMES A DAY AS NEEDED 45 capsule 1   gabapentin (NEURONTIN) 300 MG capsule Take 1 capsule (300 mg total) by mouth 3 (three) times daily. (Patient taking differently: Take 300 mg by mouth 2 (two) times daily.) 90 capsule 2   sildenafil (REVATIO) 20 MG tablet TAKE 3 TO 5 TABLETS BY MOUTH AS NEEDED 100 tablet 0   simvastatin (ZOCOR) 40 MG tablet Take 1 tablet (40 mg total) by mouth daily at 6 PM. 90 tablet 2   valsartan (DIOVAN) 80 MG tablet Take 1 tablet (80 mg total) by mouth daily. (Patient taking differently: Take 40 mg by mouth daily. Patient cuts 80 mg tablet in half  Takes 40 mg) 30 tablet 11   famotidine (PEPCID) 20 MG tablet One after supper (Patient not taking: Reported on 03/14/2023) 30 tablet 11   pantoprazole (PROTONIX) 40 MG tablet TAKE 1 TABLET (40 MG TOTAL) BY MOUTH DAILY. TAKE 30-60 MIN BEFORE FIRST MEAL OF THE DAY (Patient not taking: Reported on 03/14/2023) 90 tablet 3   Tiotropium Bromide-Olodaterol (STIOLTO RESPIMAT) 2.5-2.5 MCG/ACT AERS Inhale 2 puffs into the lungs daily. (Patient not taking: Reported on  03/14/2023) 1 each 5   No current facility-administered medications on file prior to visit.     Review of Systems  Respiratory:  Positive for cough. Negative for shortness of breath and wheezing.   Cardiovascular:  Negative for chest pain and palpitations.  Gastrointestinal:  Positive for constipation. Negative for abdominal pain and nausea.       No gerd       Objective:   Vitals:   03/14/23 0935  BP: 126/82  Pulse: 90  Temp: 98 F (36.7 C)  SpO2: 98%   BP Readings from Last 3 Encounters:  03/14/23 126/82  02/15/23 138/78  02/13/23 116/72   Wt Readings from Last 3 Encounters:  03/14/23 231 lb (104.8 kg)  02/15/23 233 lb (105.7 kg)  02/13/23 230 lb (104.3 kg)   Body mass index is 27.39 kg/m.    Physical Exam Constitutional:      General: He is not in acute distress.    Appearance: Normal appearance. He is not ill-appearing.  HENT:     Head: Normocephalic and atraumatic.  Abdominal:     General: There is no distension.     Palpations: Abdomen is soft.     Tenderness: There is no abdominal tenderness.  Skin:    General: Skin is warm and dry.  Neurological:     Mental  Status: He is alert.        Lab Results  Component Value Date   WBC 6.1 02/13/2023   HGB 15.8 02/13/2023   HCT 46.9 02/13/2023   PLT 195.0 02/13/2023   GLUCOSE 99 02/13/2023   CHOL 185 02/13/2023   TRIG 93.0 02/13/2023   HDL 65.40 02/13/2023   LDLDIRECT 109.1 08/13/2008   LDLCALC 101 (H) 02/13/2023   ALT 105 (H) 02/13/2023   AST 94 (H) 02/13/2023   NA 137 02/13/2023   K 4.8 02/13/2023   CL 99 02/13/2023   CREATININE 1.26 02/13/2023   BUN 17 02/13/2023   CO2 29 02/13/2023   TSH 3.78 02/02/2021   PSA 7.85 (H) 02/13/2023   INR 1.02 01/13/2011   HGBA1C 5.4 02/13/2023     Assessment & Plan:    See Problem List for Assessment and Plan of chronic medical problems.

## 2023-03-13 NOTE — Patient Instructions (Signed)
      Blood work was ordered.   The lab is on the first floor.    Medications changes include :   none     Return for follow up as scheduled.

## 2023-03-14 ENCOUNTER — Ambulatory Visit (INDEPENDENT_AMBULATORY_CARE_PROVIDER_SITE_OTHER): Payer: PPO | Admitting: Internal Medicine

## 2023-03-14 ENCOUNTER — Encounter: Payer: Self-pay | Admitting: Internal Medicine

## 2023-03-14 VITALS — BP 126/82 | HR 90 | Temp 98.0°F | Ht 77.0 in | Wt 231.0 lb

## 2023-03-14 DIAGNOSIS — Z125 Encounter for screening for malignant neoplasm of prostate: Secondary | ICD-10-CM

## 2023-03-14 DIAGNOSIS — R7989 Other specified abnormal findings of blood chemistry: Secondary | ICD-10-CM | POA: Diagnosis not present

## 2023-03-14 DIAGNOSIS — R972 Elevated prostate specific antigen [PSA]: Secondary | ICD-10-CM | POA: Insufficient documentation

## 2023-03-14 DIAGNOSIS — I10 Essential (primary) hypertension: Secondary | ICD-10-CM | POA: Diagnosis not present

## 2023-03-14 LAB — COMPREHENSIVE METABOLIC PANEL
ALT: 42 U/L (ref 0–53)
AST: 42 U/L — ABNORMAL HIGH (ref 0–37)
Albumin: 4.4 g/dL (ref 3.5–5.2)
Alkaline Phosphatase: 36 U/L — ABNORMAL LOW (ref 39–117)
BUN: 12 mg/dL (ref 6–23)
CO2: 25 mEq/L (ref 19–32)
Calcium: 10 mg/dL (ref 8.4–10.5)
Chloride: 99 mEq/L (ref 96–112)
Creatinine, Ser: 1.17 mg/dL (ref 0.40–1.50)
GFR: 61.11 mL/min (ref >60.00)
Glucose, Bld: 106 mg/dL — ABNORMAL HIGH (ref 70–99)
Potassium: 4.2 mEq/L (ref 3.5–5.1)
Sodium: 137 mEq/L (ref 135–145)
Total Bilirubin: 0.9 mg/dL (ref 0.2–1.2)
Total Protein: 6.8 g/dL (ref 6.0–8.3)

## 2023-03-14 LAB — PSA: PSA: 2.77 ng/mL (ref 0.10–4.00)

## 2023-03-14 NOTE — Assessment & Plan Note (Signed)
Most recent blood work shows elevation in LFTs He does drink 2 drinks of alcohol a night, but that is not new He has stopped NSAIDs since being on Eliquis, takes Tylenol as needed Recheck CMP Depending on results may need to consider ultrasound of abdomen

## 2023-03-14 NOTE — Assessment & Plan Note (Signed)
Chronic Blood pressure controlled Continue valsartan 40 mg daily

## 2023-03-14 NOTE — Assessment & Plan Note (Signed)
New He did request PSA at his last visit because many of his friends were being diagnosed with prostate cancer He does notice some difficulty emptying his bladder Will recheck psa Referral ordered for urology

## 2023-03-19 ENCOUNTER — Other Ambulatory Visit: Payer: Self-pay | Admitting: Adult Health

## 2023-03-26 ENCOUNTER — Encounter (HOSPITAL_BASED_OUTPATIENT_CLINIC_OR_DEPARTMENT_OTHER): Payer: Self-pay | Admitting: Physical Therapy

## 2023-03-26 ENCOUNTER — Ambulatory Visit (HOSPITAL_BASED_OUTPATIENT_CLINIC_OR_DEPARTMENT_OTHER): Payer: PPO | Attending: Family Medicine | Admitting: Physical Therapy

## 2023-03-26 DIAGNOSIS — R262 Difficulty in walking, not elsewhere classified: Secondary | ICD-10-CM | POA: Diagnosis not present

## 2023-03-26 DIAGNOSIS — R0781 Pleurodynia: Secondary | ICD-10-CM | POA: Diagnosis not present

## 2023-03-26 DIAGNOSIS — M533 Sacrococcygeal disorders, not elsewhere classified: Secondary | ICD-10-CM | POA: Diagnosis not present

## 2023-03-26 DIAGNOSIS — M6281 Muscle weakness (generalized): Secondary | ICD-10-CM | POA: Insufficient documentation

## 2023-03-26 DIAGNOSIS — M545 Low back pain, unspecified: Secondary | ICD-10-CM | POA: Insufficient documentation

## 2023-03-26 DIAGNOSIS — G8929 Other chronic pain: Secondary | ICD-10-CM | POA: Diagnosis not present

## 2023-03-26 NOTE — Therapy (Signed)
OUTPATIENT PHYSICAL THERAPY THORACOLUMBAR TREATMENT   PHYSICAL THERAPY DISCHARGE SUMMARY  Visits from Start of Care: 3  Plan: Patient agrees to discharge.  Patient goals were mostly met. Patient is being discharged due to meeting the stated rehab goals.         Patient Name: [name] MRN: [id number] DOB:December 04, 1945, 77 y.o., male Today's Date: 03/26/2023  END OF SESSION:  PT End of Session - 03/26/23 1201     Visit Number 3    Number of Visits 12    Date for PT Re-Evaluation 05/01/23    Authorization Type HTA    PT Start Time 1145    PT Stop Time 1205    PT Time Calculation (min) 20 min    Activity Tolerance Patient tolerated treatment well    Behavior During Therapy WFL for tasks assessed/performed               Past Medical History:  Diagnosis Date   Complete heart block (HCC)    S/P  PPM by Dr [name]   Coronary artery disease    Non-obstructive. Pt had left heart catheterization in August 2009 showing a 30% mid-circumflex lesion, otherwise coronaries were clean angiographically   Hyperlipidemia    Hypertension    OSA (obstructive sleep apnea)    Probable; study never scheduled   Past Surgical History:  Procedure Laterality Date   CARDIAC CATHETERIZATION  1993, 2009   2009- negative   COLONOSCOPY  2011   negative, [facility]   PACEMAKER INSERTION  01/13/11   MDT by Fawn Kirk for complete heart block   Patient Active Problem List   Diagnosis Date Noted   Elevated PSA 03/14/2023   Elevated LFTs 03/14/2023   Atrial flutter (HCC) 02/13/2023   Chronic left SI joint pain 12/22/2022   DOE (dyspnea on exertion) 12/22/2022   Chest pain, atypical 10/10/2022   Upper airway cough syndrome 10/10/2022   Chronic cough 02/01/2022   Chronic right SI joint pain 05/11/2021   Umbilical hernia without obstruction or gangrene 02/02/2021   Hepatic steatosis 08/05/2020   Aortic atherosclerosis (HCC) 08/04/2020   Complex regional pain syndrome type I of the upper limb  07/26/2020   Flexion contracture of joint of hand 07/26/2020   Pacemaker 07/26/2020   Rib pain on left side 06/09/2020   Decreased pulses in feet 06/09/2020   Erectile dysfunction 03/03/2017   Chronic headaches 02/13/2017   Snoring 02/15/2015   Elevated liver enzymes 09/13/2014   Dupuytren's contracture of both hands 09/02/2013   Stiffness of right hand joint 05/07/2013   Gout 12/27/2011   Essential hypertension 01/12/2011   Complete heart block (HCC) - PPM 01/12/2011   Hyperglycemia 03/02/2009   HYPERPLASIA PROSTATE UNS W/O UR OBST & OTH LUTS 08/13/2008   Hyperlipidemia 02/17/2008   PAROXYSMAL SUPRAVENTRICULAR TACHYCARDIA 02/17/2008    PCP:    Pincus Sanes, MD    REFERRING PROVIDER: Rodolph Bong, MD   REFERRING DIAG:  Diagnosis  R07.81 (ICD-10-CM) - Rib pain   L sided  Rationale for Evaluation and Treatment: Rehabilitation  THERAPY DIAG:  Rib pain  ONSET DATE: Fall 2023  SUBJECTIVE:  SUBJECTIVE STATEMENT: Pt states he continues to have intermittent lower back. The rib pain is gone and his cough is now managed after seeing pulmonology.   PERTINENT HISTORY:  A-fib, on blood thinners  PAIN:  Are you having pain? Yes: NPRS scale:  7/10 Pain location: low back Pain description: sharp Aggravating factors: movement/rotation Relieving factors: rest  PRECAUTIONS: ICD/Pacemaker  WEIGHT BEARING RESTRICTIONS: No  FALLS:  Has patient fallen in last 6 months? No  LIVING ENVIRONMENT: Lives with: lives with their spouse Lives in: House/apartment Stairs: Yes to enter Has following equipment at home: None  OCCUPATION: retired  PLOF: Independent with basic ADLs  PATIENT GOALS: reduce pain   OBJECTIVE:   DIAGNOSTIC FINDINGS:  IMPRESSION: 1. Subtle subpleural reticulations  with no definite traction bronchiectasis or clear craniocaudal predominance, respiratory motion artifact somewhat limits evaluation. Consider six-month follow-up ILD CT for further evaluation. Findings are indeterminate for UIP per consensus guidelines: Diagnosis of Idiopathic Pulmonary Fibrosis: An Official ATS/ERS/JRS/ALAT Clinical Practice Guideline. Am Rosezetta Schlatter Crit Care Med Vol 198, Iss 5, [id number], Jul 14 2017. 2. Solid right lower lobe pulmonary nodule unchanged in size when compared with priors dating back to 2007 and considered benign. 3. Severe coronary artery calcifications. 4. Hepatic steatosis. 5. Aortic Atherosclerosis (ICD10-I70.0).   IMPRESSION: Diffuse bilateral interstitial pulmonary opacity, consistent with edema or atypical/viral infection. No focal airspace opacity   PATIENT SURVEYS:  FOTO 66 69 @ DC  3 pts MCII    SCREENING FOR RED FLAGS: Bowel or bladder incontinence: No Spinal tumors: No Cauda equina syndrome: No Compression fracture: No  COGNITION: Overall cognitive status: Within functional limits for tasks assessed     SENSATION: WFL  POSTURE: decreased lumbar lordosis and increased thoracic kyphosis  PALPATION: TTP of L subscapularis, intercostals  THORACOLUMBAR ROM: pt with known history of lumbar ROM deficits and pain; no recreation of rib pain with movement; no TTP along costal angle or chondral border  AROM eval  Flexion 75%  Extension 50%  Right lateral flexion 50%  Left lateral flexion 50%  Right rotation 50%  Left rotation 50%   (Blank rows = not tested)  SPECIAL TESTS:  Rib springing (negative)   TODAY'S TREATMENT:  5/13 Exercises - Standing Quadratus Lumborum Stretch with Doorway  - 1 x daily - 7 x weekly - 3 sets - 10 reps - Seated Quadratus Lumborum Stretch in Chair  - 2 x daily - 7 x weekly - 1 sets - 3 reps - 30 hold - Sidelying Open Book  - 1 x daily - 7 x weekly - 2 sets - 10 reps - Single Arm Doorway Pec  Stretch at 120 Degrees Abduction  - 1 x daily - 7 x weekly - 1 sets - 2 reps - 30 hold   Date: 02/12/2023: Nustep for 5 min, lvl 4 while taking subjective.  Seated QL  stretch 3 x30 sec  Seated Cat/ cow  Horizontal abduction with RTB  Seated rows with RTB  OH flexion with 1l bar  Ball roll outs with blue physioball 3 ways  TrA contractions with Red physioball seated press downs.  Seated PPT  DATE: 3/20    STM to L serratus and intercostals under pec; active spasms with ischemic pressure Exercises - Standing Quadratus Lumborum Stretch with Doorway  - 1 x daily - 7 x weekly - 3 sets - 10 reps - Seated Quadratus Lumborum Stretch in Chair  - 2 x daily - 7 x weekly - 1 sets - 3 reps - 30 hold   PATIENT EDUCATION:  Education details: anatomy, exercise progression, DOMS expectations, muscle firing,  envelope of function, HEP, POC  Person educated: Patient Education method: Explanation, Demonstration, Tactile cues, Verbal cues, and Handouts Education comprehension: verbalized understanding, returned demonstration, verbal cues required, tactile cues required, and needs further education  HOME EXERCISE PROGRAM: Access Code: [email or web address] URL: [email or web address] Date: 01/31/2023 Prepared by: Zebedee Iba  ASSESSMENT:  CLINICAL IMPRESSION: Pt without concerns of the rib pain as his cough has been medically managed. Pt no longer has pain into the area but was advised to continue with mobility based exercise to prevent recurrence. Pt without concerns about ribs. However, pain into L/S was high during today's session. D/C this episode of care.   OBJECTIVE IMPAIRMENTS: decreased activity tolerance, decreased endurance, decreased mobility, decreased ROM, decreased strength, hypomobility, increased muscle spasms, impaired flexibility, improper body mechanics,  postural dysfunction, and pain.   ACTIVITY LIMITATIONS: carrying, lifting, sleeping, transfers, dressing, and reach over head  PARTICIPATION LIMITATIONS: cleaning, laundry, community activity, and exercise  PERSONAL FACTORS: Age, Fitness, Time since onset of injury/illness/exacerbation, and 3+ comorbidities:    are also affecting patient's functional outcome.   REHAB POTENTIAL: Fair  CLINICAL DECISION MAKING: Stable/uncomplicated  EVALUATION COMPLEXITY: Low   SHORT TERM GOALS: Target date: 03/14/2023   Pt will become independent with HEP in order to demonstrate synthesis of PT education.  Baseline: Goal status: met  2.  Pt will report at least 2 pt reduction on NPRS scale for pain in order to demonstrate functional improvement with household activity, self care, and ADL.  Baseline:  Goal status: met  3.  Pt will score at least 3 pt increase on FOTO to demonstrate functional improvement in MCII and pt perceived function.   Baseline:  Goal status: not met  LONG TERM GOALS: Target date: 04/25/2023   Pt  will become independent with final HEP in order to demonstrate synthesis of PT education.  Baseline:  Goal status: met  2.  Pt will score >/= 69 on FOTO to demonstrate improvement in perceived trunk/rib function.  Baseline:  Goal status: not met  3.  Pt will be able to demonstrate ability to take deep breath and/or cough with minimized pain in order to demonstrate functional improvement in UE/LE function for self-care and house hold duties.  Baseline:  Goal status: met  PLAN:  PT FREQUENCY: 1x/week  PT DURATION: 12 weeks (likely to D/C by 4 wks if no improvement)  PLANNED INTERVENTIONS: Therapeutic exercises, Therapeutic activity, Neuromuscular re-education, Balance training, Gait training, Patient/Family education, Self Care, Joint mobilization, Joint manipulation, Orthotic/Fit training, DME instructions, Aquatic Therapy, Dry Needling, Electrical stimulation, Spinal  manipulation, Spinal mobilization, Cryotherapy, Moist heat, scar mobilization, Splintting, Taping, Vasopneumatic device, Traction, Ultrasound, Ionotophoresis 4mg /ml Dexamethasone, Manual therapy, and Re-evaluation.  PLAN FOR NEXT SESSION: STM to R serratus and L intercostals, consider rib mobilizations, seated T/S mobility- avoid lumbar aggravation   Zebedee Iba, PT 03/26/2023, 12:38 PM

## 2023-03-27 ENCOUNTER — Ambulatory Visit (INDEPENDENT_AMBULATORY_CARE_PROVIDER_SITE_OTHER): Payer: PPO | Admitting: Family Medicine

## 2023-03-27 ENCOUNTER — Other Ambulatory Visit: Payer: Self-pay

## 2023-03-27 VITALS — BP 158/90 | HR 83 | Ht 77.0 in | Wt 232.0 lb

## 2023-03-27 DIAGNOSIS — G8929 Other chronic pain: Secondary | ICD-10-CM

## 2023-03-27 DIAGNOSIS — M533 Sacrococcygeal disorders, not elsewhere classified: Secondary | ICD-10-CM | POA: Diagnosis not present

## 2023-03-27 DIAGNOSIS — M545 Low back pain, unspecified: Secondary | ICD-10-CM

## 2023-03-27 NOTE — Progress Notes (Signed)
Rubin Payor, PhD, LAT, ATC acting as a scribe for Clementeen Graham, MD.  [name] is a 77 y.o. male who presents to [facility] today for 83-month f/u bilat SI joint pain. Pt was last seen by Dr. Denyse Amass on 12/22/22 and was given bilat SI joint steroid injections. Today, pt reports SI joint pain returned over the last month. He feels like the prior injections were beneficial. He has been looking forward to repeat injection.   Dx imaging: 08/10/21 L-spine CT myelogram             05/11/21 R hip & L-spine XR  Pertinent review of systems: No fevers or chills  Relevant historical information: Hypertension.  SI joint pain and dysfunction.   Exam:  BP (!) 158/90   Pulse 83   Ht 6\' 5"  (1.956 m)   Wt 232 lb (105.2 kg)   SpO2 98%   BMI 27.51 kg/m  General: Well Developed, well nourished, and in no acute distress.   MSK: Lumbar spine/SI joint: Tender palpation bilateral SI joints.  Normal lumbar spine motion otherwise.    Lab and Radiology Results  Procedure: Real-time Ultrasound Guided Injection of right SI joint Device: Philips Affiniti 50G Images permanently stored and available for review in PACS Verbal informed consent obtained.  Discussed risks and benefits of procedure. Warned about infection, bleeding, hyperglycemia damage to structures among others. Patient expresses understanding and agreement Time-out conducted.   Noted no overlying erythema, induration, or other signs of local infection.   Skin prepped in a sterile fashion.   Local anesthesia: Topical Ethyl chloride.   With sterile technique and under real time ultrasound guidance: 40 mg of Kenalog and 2 ml Marcaine injected into SI joint. Fluid seen entering the joint capsule.   Completed without difficulty   Pain immediately resolved suggesting accurate placement of the medication.   Advised to call if fevers/chills, erythema, induration, drainage, or persistent bleeding.   Images  permanently stored and available for review in the ultrasound unit.  Impression: Technically successful ultrasound guided injection.  Procedure: Real-time Ultrasound Guided Injection of left SI joint Device: Philips Affiniti 50G Images permanently stored and available for review in PACS Verbal informed consent obtained.  Discussed risks and benefits of procedure. Warned about infection, bleeding, hyperglycemia damage to structures among others. Patient expresses understanding and agreement Time-out conducted.   Noted no overlying erythema, induration, or other signs of local infection.   Skin prepped in a sterile fashion.   Local anesthesia: Topical Ethyl chloride.   With sterile technique and under real time ultrasound guidance: 40 mg of Kenalog and 2 mL of Marcaine injected into SI joint. Fluid seen entering the joint capsule.   Completed without difficulty   Pain immediately resolved suggesting accurate placement of the medication.   Advised to call if fevers/chills, erythema, induration, drainage, or persistent bleeding.   Images permanently stored and available for review in the ultrasound unit.  Impression: Technically successful ultrasound guided injection.          Assessment and Plan: 77 y.o. male with chronic bilateral SI joint pain.  Doing reasonably well with steroid injections.  Will repeat injection today and recheck in 3 months.  Anticipate likely SI joint injection at that time.  He is benefiting from some physical therapy.  Will dedicate physical therapy to the lumbar spine and SI joints that could be helpful.  New PT order modifications added today.   PDMP not reviewed this  encounter. Orders Placed This Encounter  Procedures   [place] LIMITED JOINT SPACE STRUCTURES LOW BILAT(NO LINKED CHARGES)    Order Specific Question:   Reason for Exam (SYMPTOM  OR DIAGNOSIS REQUIRED)    Answer:   bilateral SI joint pain    Order Specific Question:   Preferred imaging location?     Answer:   [facility] referral to Physical Therapy    Referral Priority:   Routine    Referral Type:   Physical Medicine    Referral Reason:   Specialty Services Required    Requested Specialty:   Physical Therapy    Number of Visits Requested:   1   No orders of the defined types were placed in this encounter.    Discussed warning signs or symptoms. Please see discharge instructions. Patient expresses understanding.   The above documentation has been reviewed and is accurate and complete Clementeen Graham, M.D.

## 2023-03-27 NOTE — Patient Instructions (Addendum)
Thank you for coming in today.   You received an injection today. Seek immediate medical attention if the joint becomes red, extremely painful, or is oozing fluid.   Schedule recheck in 3 months.

## 2023-03-28 ENCOUNTER — Ambulatory Visit (INDEPENDENT_AMBULATORY_CARE_PROVIDER_SITE_OTHER): Payer: PPO

## 2023-03-28 DIAGNOSIS — I442 Atrioventricular block, complete: Secondary | ICD-10-CM

## 2023-03-29 LAB — CUP PACEART REMOTE DEVICE CHECK
Battery Impedance: 3379 Ohm
Battery Remaining Longevity: 22 mo
Battery Voltage: 2.72 V
Brady Statistic AP VP Percent: 2 %
Brady Statistic AP VS Percent: 0 %
Brady Statistic AS VP Percent: 97 %
Brady Statistic AS VS Percent: 0 %
Date Time Interrogation Session: 20240515133840
Implantable Lead Connection Status: 753985
Implantable Lead Connection Status: 753985
Implantable Lead Implant Date: 20120302
Implantable Lead Implant Date: 20120302
Implantable Lead Location: 753859
Implantable Lead Location: 753860
Implantable Lead Model: 5076
Implantable Pulse Generator Implant Date: 20120302
Lead Channel Impedance Value: 578 Ohm
Lead Channel Impedance Value: 642 Ohm
Lead Channel Pacing Threshold Amplitude: 0.5 V
Lead Channel Pacing Threshold Amplitude: 0.625 V
Lead Channel Pacing Threshold Pulse Width: 0.4 ms
Lead Channel Pacing Threshold Pulse Width: 0.4 ms
Lead Channel Setting Pacing Amplitude: 2 V
Lead Channel Setting Pacing Amplitude: 2 V
Lead Channel Setting Pacing Pulse Width: 0.4 ms
Lead Channel Setting Sensing Sensitivity: 2.8 mV
Zone Setting Status: 755011
Zone Setting Status: 755011

## 2023-04-01 ENCOUNTER — Other Ambulatory Visit: Payer: Self-pay | Admitting: Adult Health

## 2023-04-02 NOTE — Telephone Encounter (Signed)
Please advise on refill request

## 2023-04-03 ENCOUNTER — Encounter (HOSPITAL_BASED_OUTPATIENT_CLINIC_OR_DEPARTMENT_OTHER): Payer: PPO | Admitting: Physical Therapy

## 2023-04-10 ENCOUNTER — Ambulatory Visit (HOSPITAL_BASED_OUTPATIENT_CLINIC_OR_DEPARTMENT_OTHER): Payer: PPO | Admitting: Physical Therapy

## 2023-04-10 ENCOUNTER — Encounter (HOSPITAL_BASED_OUTPATIENT_CLINIC_OR_DEPARTMENT_OTHER): Payer: Self-pay | Admitting: Physical Therapy

## 2023-04-10 ENCOUNTER — Other Ambulatory Visit: Payer: Self-pay

## 2023-04-10 DIAGNOSIS — R262 Difficulty in walking, not elsewhere classified: Secondary | ICD-10-CM

## 2023-04-10 DIAGNOSIS — M545 Low back pain, unspecified: Secondary | ICD-10-CM

## 2023-04-10 DIAGNOSIS — R0781 Pleurodynia: Secondary | ICD-10-CM | POA: Diagnosis not present

## 2023-04-10 DIAGNOSIS — M6281 Muscle weakness (generalized): Secondary | ICD-10-CM

## 2023-04-10 NOTE — Therapy (Signed)
OUTPATIENT PHYSICAL THERAPY THORACOLUMBAR EVALUATION   Patient Name: [name] MRN: [id number] DOB:10/19/1946, 77 y.o., male Today's Date: 04/10/2023  END OF SESSION:  PT End of Session - 04/10/23 1130     Visit Number 1    Number of Visits 16    Date for PT Re-Evaluation 07/09/23    Authorization Type HTA    PT Start Time 1145    PT Stop Time 1230    PT Time Calculation (min) 45 min    Activity Tolerance Patient tolerated treatment well    Behavior During Therapy WFL for tasks assessed/performed             Past Medical History:  Diagnosis Date   Complete heart block (HCC)    S/P  PPM by Dr [name]   Coronary artery disease    Non-obstructive. Pt had left heart catheterization in August 2009 showing a 30% mid-circumflex lesion, otherwise coronaries were clean angiographically   Hyperlipidemia    Hypertension    OSA (obstructive sleep apnea)    Probable; study never scheduled   Past Surgical History:  Procedure Laterality Date   CARDIAC CATHETERIZATION  1993, 2009   2009- negative   COLONOSCOPY  2011   negative, [facility]   PACEMAKER INSERTION  01/13/11   MDT by Fawn Kirk for complete heart block   Patient Active Problem List   Diagnosis Date Noted   Elevated PSA 03/14/2023   Elevated LFTs 03/14/2023   Atrial flutter (HCC) 02/13/2023   Chronic left SI joint pain 12/22/2022   DOE (dyspnea on exertion) 12/22/2022   Chest pain, atypical 10/10/2022   Upper airway cough syndrome 10/10/2022   Chronic cough 02/01/2022   Chronic right SI joint pain 05/11/2021   Umbilical hernia without obstruction or gangrene 02/02/2021   Hepatic steatosis 08/05/2020   Aortic atherosclerosis (HCC) 08/04/2020   Complex regional pain syndrome type I of the upper limb 07/26/2020   Flexion contracture of joint of hand 07/26/2020   Pacemaker 07/26/2020   Rib pain on left side 06/09/2020   Decreased pulses in feet 06/09/2020   Erectile dysfunction 03/03/2017   Chronic headaches  02/13/2017   Snoring 02/15/2015   Elevated liver enzymes 09/13/2014   Dupuytren's contracture of both hands 09/02/2013   Stiffness of right hand joint 05/07/2013   Gout 12/27/2011   Essential hypertension 01/12/2011   Complete heart block (HCC) - PPM 01/12/2011   Hyperglycemia 03/02/2009   HYPERPLASIA PROSTATE UNS W/O UR OBST & OTH LUTS 08/13/2008   Hyperlipidemia 02/17/2008   PAROXYSMAL SUPRAVENTRICULAR TACHYCARDIA 02/17/2008    PCP:    Pincus Sanes, MD    REFERRING PROVIDER: Rodolph Bong, MD   REFERRING DIAG:  Diagnosis  R07.81 (ICD-10-CM) - Rib pain   L sided  Rationale for Evaluation and Treatment: Rehabilitation  THERAPY DIAG:  Pain, lumbar region - Plan: PT plan of care cert/re-cert  Muscle weakness (generalized) - Plan: PT plan of care cert/re-cert  Difficulty walking - Plan: PT plan of care cert/re-cert  ONSET DATE: Fall 2023  SUBJECTIVE:  SUBJECTIVE STATEMENT: Pt states he is back today for his history of LBP. He no longer needs to sit on ice but the back hurts badly. He had SIJ injection from Dr. Denyse Amass that has given partial relief but it is still uncomfortable after being still for a while. Walking longer distances still hurts. He will be leaving for the beach for a month. Pt has not returned to previous HEP due to recent pain. Pt would like to restart exercise with PT. Pt feels that the injections help to reduce his pain. Luckily, the pain has progressed but has been managed with medical intervention. Pt states that walking back and forth from the parking lot twice will increase pain.   Pt is well known to evaluating PT for history of lumbar pain and radiculopathy.  PERTINENT HISTORY:  A-fib, on blood thinners  PAIN:  Are you having pain? no: NPRS scale: 0/10; worst  7/10 Pain location: Mostly R Pain description: sharp Aggravating factors: coughing, deep breath Relieving factors: rest  PRECAUTIONS: ICD/Pacemaker  WEIGHT BEARING RESTRICTIONS: No  FALLS:  Has patient fallen in last 6 months? No  LIVING ENVIRONMENT: Lives with: lives with their spouse Lives in: House/apartment Stairs: Yes to enter Has following equipment at home: None  OCCUPATION: retired  PLOF: Independent with basic ADLs  PATIENT GOALS: reduce pain   OBJECTIVE:   DIAGNOSTIC FINDINGS:   IMPRESSION: 1. Advanced lumbar disc and facet degeneration, greatest at L3-4 where there is moderate spinal stenosis and severe right neural stenosis. 2. Moderate bilateral neural foraminal stenosis at L4-5. 3. Mild spinal stenosis at L2-3. 4. Aortic Atherosclerosis (ICD10-I70.0).     PATIENT SURVEYS:  Modified Oswestry Low Back Pain Disability Questionnaire: 19 / 50 = 38.0 %  SCREENING FOR RED FLAGS: Bowel or bladder incontinence: No Spinal tumors: No Cauda equina syndrome: No Compression fracture: No  COGNITION: Overall cognitive status: Within functional limits for tasks assessed     SENSATION: WFL  POSTURE: decreased lumbar lordosis and increased thoracic kyphosis  PALPATION: TTP of R>L lumbar paraspinals   LUMBAR ROM: pt with known history of lumbar ROM deficits and pain; no recreation of rib pain with movement; no TTP along costal angle or chondral border  AROM eval  Flexion 75% p!  Extension 50%   Right lateral flexion 25%  Left lateral flexion 25%  Right rotation 50%  Left rotation 50%   (Blank rows = not tested)  AROM Right 5/28 Left 5/28  Hip flexion 100 100  Hip extension -5 -5  Hip abduction    Hip adduction    Hip internal rotation 25 25  Hip external rotation 30 30   (Blank rows = not tested)    MMT  No pain Right 5/28 Left 5/28  Hip flexion 4/5 4/5  Hip extension    Hip abduction 4/5 4/5  Hip adduction 4+/5 4+/5  Hip internal  rotation    Hip external rotation 4/5 4/5   (Blank rows = not tested)   SPECIAL TESTS:    Functional Tests     Functional tests Sit to Stand 15.3s      TODAY'S TREATMENT:  DATE: 5/28   STM bilat lumbar paraspinals and QL   Exercises - Supine Lower Trunk Rotation  - 2 x daily - 7 x weekly - 2 sets - 10 reps - 2 hold - Supine Posterior Pelvic Tilt  - 2 x daily - 7 x weekly - 2 sets - 10 reps - 2 hold - Supine Piriformis Stretch with Foot on Ground  - 2 x daily - 7 x weekly - 1 sets - 3 reps - 30 hold - Seated Quadratus Lumborum Stretch in Chair  - 2 x daily - 7 x weekly - 1 sets - 3 reps - 30 hold    PATIENT EDUCATION:  Education details: MOI, diagnosis, prognosis, anatomy, exercise progression, DOMS expectations, muscle firing,  envelope of function, HEP, POC  Person educated: Patient Education method: Explanation, Demonstration, Tactile cues, Verbal cues, and Handouts Education comprehension: verbalized understanding, returned demonstration, verbal cues required, tactile cues required, and needs further education  HOME EXERCISE PROGRAM:  Access Code: [facility] URL: [email or web address] Date: 04/10/2023 Prepared by: Zebedee Iba   ASSESSMENT:  CLINICAL IMPRESSION: Patient is a 77 y.o. male who was seen today for physical therapy evaluation and treatment for c/c of SIJ pain, R>L. Pt is well known to evaluating therapist from previous episode. Pt's s/s appear consistent with history of lumbar radiculopathy with SIJ irritation. No NT/radicular symptoms noted today with clinical testing since recent SIJ injection. Pt's pain is minimally sensitive and irritable with movement today. Plan to continue with manual therapy and progression of lumbopelvic mobility as tolerated. Pt's symptoms appear most related to general stiffness and tightness. Pt  advised to strongly utilize pool at his beach home as well as consider use of massage therapy. Pt will be out of town for the next month and will return after vacation. Pt would benefit from continued skilled therapy in order to reach goals and maximize functional T/S and rib function for return to PLOF.    OBJECTIVE IMPAIRMENTS: decreased activity tolerance, decreased endurance, decreased mobility, decreased ROM, decreased strength, hypomobility, increased muscle spasms, impaired flexibility, improper body mechanics, postural dysfunction, and pain.   ACTIVITY LIMITATIONS: carrying, lifting, sleeping, transfers, dressing, and reach over head  PARTICIPATION LIMITATIONS: cleaning, laundry, community activity, and exercise  PERSONAL FACTORS: Age, Fitness, Time since onset of injury/illness/exacerbation, and 3+ comorbidities:    are also affecting patient's functional outcome.   REHAB POTENTIAL: Fair  CLINICAL DECISION MAKING: Stable/uncomplicated  EVALUATION COMPLEXITY: Low   SHORT TERM GOALS: Target date: 05/22/2023   Pt will become independent with HEP in order to demonstrate synthesis of PT education.   Goal status: INITIAL  2.  Pt will report at least 2 pt reduction on NPRS scale for pain in order to demonstrate functional improvement with household activity, self care, and ADL.   Goal status: INITIAL  3.  Pt will demonstrate at least a 12.8 improvement in Oswestry Index in order to demonstrate a clinically significant change in LBP and function. .    Goal status: INITIAL  LONG TERM GOALS: Target date: 07/03/2023    Pt  will become independent with final HEP in order to demonstrate synthesis of PT education.  Baseline:  Goal status: INITIAL  2. Pt will be able to demonstrate DL squat to chair height without pain demonstrate functional improvement in bilat LE strength for return to PLOF and exercise.  Baseline:  Goal status: INITIAL  3.  Pt will be able to perform 5XSTS in  under 12s  in order to demonstrate  functional improvement above the cut off score for adults.  Goal status: INITIAL  4.  Pt will be able to demonstrate/report ability to walk >15 mins without pain in order to demonstrate functional improvement and tolerance to exercise and community mobility.   Goal status: INITIAL PLAN:  PT FREQUENCY: 1x/week  PT DURATION: 12 weeks (likely to D/C by 8 with independent management)  PLANNED INTERVENTIONS: Therapeutic exercises, Therapeutic activity, Neuromuscular re-education, Balance training, Gait training, Patient/Family education, Self Care, Joint mobilization, Joint manipulation, Orthotic/Fit training, DME instructions, Aquatic Therapy, Dry Needling, Electrical stimulation, Spinal manipulation, Spinal mobilization, Cryotherapy, Moist heat, scar mobilization, Splintting, Taping, Vasopneumatic device, Traction, Ultrasound, Ionotophoresis 4mg /ml Dexamethasone, Manual therapy, and Re-evaluation.  PLAN FOR NEXT SESSION: stretching program, consider yoga, manual PRN,    Zebedee Iba, PT 04/10/2023, 12:54 PM

## 2023-04-11 DIAGNOSIS — L814 Other melanin hyperpigmentation: Secondary | ICD-10-CM | POA: Diagnosis not present

## 2023-04-11 DIAGNOSIS — D225 Melanocytic nevi of trunk: Secondary | ICD-10-CM | POA: Diagnosis not present

## 2023-04-11 DIAGNOSIS — Z85828 Personal history of other malignant neoplasm of skin: Secondary | ICD-10-CM | POA: Diagnosis not present

## 2023-04-11 DIAGNOSIS — D485 Neoplasm of uncertain behavior of skin: Secondary | ICD-10-CM | POA: Diagnosis not present

## 2023-04-11 DIAGNOSIS — L821 Other seborrheic keratosis: Secondary | ICD-10-CM | POA: Diagnosis not present

## 2023-04-11 DIAGNOSIS — L570 Actinic keratosis: Secondary | ICD-10-CM | POA: Diagnosis not present

## 2023-04-11 DIAGNOSIS — D489 Neoplasm of uncertain behavior, unspecified: Secondary | ICD-10-CM | POA: Diagnosis not present

## 2023-04-11 DIAGNOSIS — D0461 Carcinoma in situ of skin of right upper limb, including shoulder: Secondary | ICD-10-CM | POA: Diagnosis not present

## 2023-04-11 DIAGNOSIS — L578 Other skin changes due to chronic exposure to nonionizing radiation: Secondary | ICD-10-CM | POA: Diagnosis not present

## 2023-04-12 NOTE — Progress Notes (Signed)
Remote pacemaker transmission.   

## 2023-04-22 ENCOUNTER — Encounter: Payer: Self-pay | Admitting: Internal Medicine

## 2023-05-14 ENCOUNTER — Ambulatory Visit (HOSPITAL_BASED_OUTPATIENT_CLINIC_OR_DEPARTMENT_OTHER): Payer: PPO | Attending: Family Medicine | Admitting: Physical Therapy

## 2023-05-14 ENCOUNTER — Encounter (HOSPITAL_BASED_OUTPATIENT_CLINIC_OR_DEPARTMENT_OTHER): Payer: Self-pay | Admitting: Physical Therapy

## 2023-05-14 DIAGNOSIS — M6281 Muscle weakness (generalized): Secondary | ICD-10-CM | POA: Diagnosis not present

## 2023-05-14 DIAGNOSIS — R262 Difficulty in walking, not elsewhere classified: Secondary | ICD-10-CM | POA: Diagnosis not present

## 2023-05-14 DIAGNOSIS — M545 Low back pain, unspecified: Secondary | ICD-10-CM | POA: Diagnosis not present

## 2023-05-14 NOTE — Therapy (Signed)
OUTPATIENT PHYSICAL THERAPY THORACOLUMBAR EVALUATION   Patient Name: [name] MRN: [id number] DOB:09-20-1946, 77 y.o., male Today's Date: 05/14/2023  END OF SESSION:  PT End of Session - 05/14/23 1308     Visit Number 2    Number of Visits 16    Date for PT Re-Evaluation 07/09/23    Authorization Type HTA    PT Start Time 1315    PT Stop Time 1343    PT Time Calculation (min) 28 min    Activity Tolerance Patient tolerated treatment well    Behavior During Therapy WFL for tasks assessed/performed             Past Medical History:  Diagnosis Date   Complete heart block (HCC)    S/P  PPM by Dr [name]   Coronary artery disease    Non-obstructive. Pt had left heart catheterization in August 2009 showing a 30% mid-circumflex lesion, otherwise coronaries were clean angiographically   Hyperlipidemia    Hypertension    OSA (obstructive sleep apnea)    Probable; study never scheduled   Past Surgical History:  Procedure Laterality Date   CARDIAC CATHETERIZATION  1993, 2009   2009- negative   COLONOSCOPY  2011   negative, [facility]   PACEMAKER INSERTION  01/13/11   MDT by Fawn Kirk for complete heart block   Patient Active Problem List   Diagnosis Date Noted   Elevated PSA 03/14/2023   Elevated LFTs 03/14/2023   Atrial flutter (HCC) 02/13/2023   Chronic left SI joint pain 12/22/2022   DOE (dyspnea on exertion) 12/22/2022   Chest pain, atypical 10/10/2022   Upper airway cough syndrome 10/10/2022   Chronic cough 02/01/2022   Chronic right SI joint pain 05/11/2021   Umbilical hernia without obstruction or gangrene 02/02/2021   Hepatic steatosis 08/05/2020   Aortic atherosclerosis (HCC) 08/04/2020   Complex regional pain syndrome type I of the upper limb 07/26/2020   Flexion contracture of joint of hand 07/26/2020   Pacemaker 07/26/2020   Rib pain on left side 06/09/2020   Decreased pulses in feet 06/09/2020   Erectile dysfunction 03/03/2017   Chronic headaches  02/13/2017   Snoring 02/15/2015   Elevated liver enzymes 09/13/2014   Dupuytren's contracture of both hands 09/02/2013   Stiffness of right hand joint 05/07/2013   Gout 12/27/2011   Essential hypertension 01/12/2011   Complete heart block (HCC) - PPM 01/12/2011   Hyperglycemia 03/02/2009   HYPERPLASIA PROSTATE UNS W/O UR OBST & OTH LUTS 08/13/2008   Hyperlipidemia 02/17/2008   PAROXYSMAL SUPRAVENTRICULAR TACHYCARDIA 02/17/2008    PCP:    Pincus Sanes, MD    REFERRING PROVIDER: Rodolph Bong, MD   REFERRING DIAG:  Diagnosis  R07.81 (ICD-10-CM) - Rib pain   L sided  Rationale for Evaluation and Treatment: Rehabilitation  THERAPY DIAG:  Pain, lumbar region  Muscle weakness (generalized)  Difficulty walking  ONSET DATE: Fall 2023  SUBJECTIVE:  SUBJECTIVE STATEMENT:  Pt returns from the beach today. Pt reports feels increased stiffness across LBP and especially R SIJ with sitting for too long. Sleeping at night requires multiple pillows in order to position comfortably. "As long as I don't pop up, I can manage it."   Eval:  Pt states he is back today for his history of LBP. He no longer needs to sit on ice but the back hurts badly. He had SIJ injection from Dr. Denyse Amass that has given partial relief but it is still uncomfortable after being still for a while. Walking longer distances still hurts. He will be leaving for the beach for a month. Pt has not returned to previous HEP due to recent pain. Pt would like to restart exercise with PT. Pt feels that the injections help to reduce his pain. Luckily, the pain has progressed but has been managed with medical intervention. Pt states that walking back and forth from the parking lot twice will increase pain.   Pt is well known to evaluating PT  for history of lumbar pain and radiculopathy.  PERTINENT HISTORY:  A-fib, on blood thinners  PAIN:  Are you having pain? no: NPRS scale: 0/10; worst 7/10 Pain location: Mostly R Pain description: sharp Aggravating factors: coughing, deep breath Relieving factors: rest  PRECAUTIONS: ICD/Pacemaker  WEIGHT BEARING RESTRICTIONS: No  FALLS:  Has patient fallen in last 6 months? No  LIVING ENVIRONMENT: Lives with: lives with their spouse Lives in: House/apartment Stairs: Yes to enter Has following equipment at home: None  OCCUPATION: retired  PLOF: Independent with basic ADLs  PATIENT GOALS: reduce pain   OBJECTIVE:   DIAGNOSTIC FINDINGS:   IMPRESSION: 1. Advanced lumbar disc and facet degeneration, greatest at L3-4 where there is moderate spinal stenosis and severe right neural stenosis. 2. Moderate bilateral neural foraminal stenosis at L4-5. 3. Mild spinal stenosis at L2-3. 4. Aortic Atherosclerosis (ICD10-I70.0).    TODAY'S TREATMENT:                                                                                                                              DATE:   7/1  STM R L2-5 lumbar paraspinals and QL; STM R gluteals, deep hip rotators L2-5 UPA grade III in S/L (unable to do prone)  Supine piriformis stretching 30s 3x each STS 5x from raised height table    5/28   STM bilat lumbar paraspinals and QL   Exercises - Supine Lower Trunk Rotation  - 2 x daily - 7 x weekly - 2 sets - 10 reps - 2 hold - Supine Posterior Pelvic Tilt  - 2 x daily - 7 x weekly - 2 sets - 10 reps - 2 hold - Supine Piriformis Stretch with Foot on Ground  - 2 x daily - 7 x weekly - 1 sets - 3 reps - 30 hold - Seated Quadratus Lumborum Stretch in Chair  - 2 x daily - 7 x weekly -  1 sets - 3 reps - 30 hold    PATIENT EDUCATION:  Education details: MOI, diagnosis, prognosis, anatomy, exercise progression, DOMS expectations, muscle firing,  envelope of function, HEP,  POC  Person educated: Patient Education method: Explanation, Demonstration, Tactile cues, Verbal cues, and Handouts Education comprehension: verbalized understanding, returned demonstration, verbal cues required, tactile cues required, and needs further education  HOME EXERCISE PROGRAM:  Access Code: [facility] URL: [email or web address] Date: 04/10/2023 Prepared by: Zebedee Iba   ASSESSMENT:  CLINICAL IMPRESSION: Pt presents today with stiffness across the lumbar spine and particularly the R SIJ which creates pain with STS and sit < > supine transfers. Pt did have improvement in discomfort following manual therapy. Pt advised to continue with use of pool based exercise to reduce stiffness. Plan to continue with manual therapy and progression of lumbopelvic mobility as tolerated. Consider use of machine based exercise to control degrees of freedom at the lumbar and hip. Consider hip ABD isometric and hip ER based motions at next for HEP.  Pt would benefit from continued skilled therapy in order to reach goals and maximize functional T/S and rib function for return to PLOF.    OBJECTIVE IMPAIRMENTS: decreased activity tolerance, decreased endurance, decreased mobility, decreased ROM, decreased strength, hypomobility, increased muscle spasms, impaired flexibility, improper body mechanics, postural dysfunction, and pain.   ACTIVITY LIMITATIONS: carrying, lifting, sleeping, transfers, dressing, and reach over head  PARTICIPATION LIMITATIONS: cleaning, laundry, community activity, and exercise  PERSONAL FACTORS: Age, Fitness, Time since onset of injury/illness/exacerbation, and 3+ comorbidities:    are also affecting patient's functional outcome.   REHAB POTENTIAL: Fair  CLINICAL DECISION MAKING: Stable/uncomplicated  EVALUATION COMPLEXITY: Low   SHORT TERM GOALS: Target date: 05/22/2023   Pt will become independent with HEP in order to demonstrate synthesis of PT education.    Goal status: INITIAL  2.  Pt will report at least 2 pt reduction on NPRS scale for pain in order to demonstrate functional improvement with household activity, self care, and ADL.   Goal status: INITIAL  3.  Pt will demonstrate at least a 12.8 improvement in Oswestry Index in order to demonstrate a clinically significant change in LBP and function. .    Goal status: INITIAL  LONG TERM GOALS: Target date: 07/03/2023    Pt  will become independent with final HEP in order to demonstrate synthesis of PT education.  Baseline:  Goal status: INITIAL  2. Pt will be able to demonstrate DL squat to chair height without pain demonstrate functional improvement in bilat LE strength for return to PLOF and exercise.  Baseline:  Goal status: INITIAL  3.  Pt will be able to perform 5XSTS in under 12s  in order to demonstrate functional improvement above the cut off score for adults.  Goal status: INITIAL  4.  Pt will be able to demonstrate/report ability to walk >15 mins without pain in order to demonstrate functional improvement and tolerance to exercise and community mobility.   Goal status: INITIAL PLAN:  PT FREQUENCY: 1x/week  PT DURATION: 12 weeks (likely to D/C by 8 with independent management)  PLANNED INTERVENTIONS: Therapeutic exercises, Therapeutic activity, Neuromuscular re-education, Balance training, Gait training, Patient/Family education, Self Care, Joint mobilization, Joint manipulation, Orthotic/Fit training, DME instructions, Aquatic Therapy, Dry Needling, Electrical stimulation, Spinal manipulation, Spinal mobilization, Cryotherapy, Moist heat, scar mobilization, Splintting, Taping, Vasopneumatic device, Traction, Ultrasound, Ionotophoresis 4mg /ml Dexamethasone, Manual therapy, and Re-evaluation.  PLAN FOR NEXT SESSION: stretching program, consider yoga, manual PRN,    Zebedee Iba,  PT 05/14/2023, 1:52 PM

## 2023-05-30 ENCOUNTER — Other Ambulatory Visit: Payer: Self-pay | Admitting: Adult Health

## 2023-05-30 DIAGNOSIS — N400 Enlarged prostate without lower urinary tract symptoms: Secondary | ICD-10-CM | POA: Diagnosis not present

## 2023-05-30 DIAGNOSIS — R350 Frequency of micturition: Secondary | ICD-10-CM | POA: Diagnosis not present

## 2023-05-30 DIAGNOSIS — R972 Elevated prostate specific antigen [PSA]: Secondary | ICD-10-CM | POA: Diagnosis not present

## 2023-05-30 DIAGNOSIS — R351 Nocturia: Secondary | ICD-10-CM | POA: Diagnosis not present

## 2023-06-07 DIAGNOSIS — C44729 Squamous cell carcinoma of skin of left lower limb, including hip: Secondary | ICD-10-CM | POA: Diagnosis not present

## 2023-06-07 DIAGNOSIS — L570 Actinic keratosis: Secondary | ICD-10-CM | POA: Diagnosis not present

## 2023-06-07 DIAGNOSIS — D485 Neoplasm of uncertain behavior of skin: Secondary | ICD-10-CM | POA: Diagnosis not present

## 2023-06-07 DIAGNOSIS — D0461 Carcinoma in situ of skin of right upper limb, including shoulder: Secondary | ICD-10-CM | POA: Diagnosis not present

## 2023-06-07 DIAGNOSIS — C44629 Squamous cell carcinoma of skin of left upper limb, including shoulder: Secondary | ICD-10-CM | POA: Diagnosis not present

## 2023-06-10 ENCOUNTER — Other Ambulatory Visit: Payer: Self-pay | Admitting: Internal Medicine

## 2023-06-14 ENCOUNTER — Ambulatory Visit (HOSPITAL_BASED_OUTPATIENT_CLINIC_OR_DEPARTMENT_OTHER): Payer: PPO | Attending: Family Medicine | Admitting: Physical Therapy

## 2023-06-14 ENCOUNTER — Encounter (HOSPITAL_BASED_OUTPATIENT_CLINIC_OR_DEPARTMENT_OTHER): Payer: Self-pay | Admitting: Physical Therapy

## 2023-06-14 DIAGNOSIS — M545 Low back pain, unspecified: Secondary | ICD-10-CM | POA: Diagnosis not present

## 2023-06-14 DIAGNOSIS — L089 Local infection of the skin and subcutaneous tissue, unspecified: Secondary | ICD-10-CM | POA: Diagnosis not present

## 2023-06-14 DIAGNOSIS — R262 Difficulty in walking, not elsewhere classified: Secondary | ICD-10-CM | POA: Diagnosis not present

## 2023-06-14 DIAGNOSIS — M6281 Muscle weakness (generalized): Secondary | ICD-10-CM

## 2023-06-14 NOTE — Therapy (Signed)
OUTPATIENT PHYSICAL THERAPY THORACOLUMBAR TREATMENT   Patient Name: [name] MRN: [id number] DOB:1946-07-07, 77 y.o., male Today's Date: 06/14/2023  END OF SESSION:  PT End of Session - 06/14/23 1146     Visit Number 3    Number of Visits 16    Date for PT Re-Evaluation 07/09/23    Authorization Type HTA    PT Start Time 1146    PT Stop Time 1225    PT Time Calculation (min) 39 min    Activity Tolerance Patient tolerated treatment well    Behavior During Therapy WFL for tasks assessed/performed             Past Medical History:  Diagnosis Date   Complete heart block (HCC)    S/P  PPM by Dr [name]   Coronary artery disease    Non-obstructive. Pt had left heart catheterization in August 2009 showing a 30% mid-circumflex lesion, otherwise coronaries were clean angiographically   Hyperlipidemia    Hypertension    OSA (obstructive sleep apnea)    Probable; study never scheduled   Past Surgical History:  Procedure Laterality Date   CARDIAC CATHETERIZATION  1993, 2009   2009- negative   COLONOSCOPY  2011   negative, [facility]   PACEMAKER INSERTION  01/13/11   MDT by Fawn Kirk for complete heart block   Patient Active Problem List   Diagnosis Date Noted   Elevated PSA 03/14/2023   Elevated LFTs 03/14/2023   Atrial flutter (HCC) 02/13/2023   Chronic left SI joint pain 12/22/2022   DOE (dyspnea on exertion) 12/22/2022   Chest pain, atypical 10/10/2022   Upper airway cough syndrome 10/10/2022   Chronic cough 02/01/2022   Chronic right SI joint pain 05/11/2021   Umbilical hernia without obstruction or gangrene 02/02/2021   Hepatic steatosis 08/05/2020   Aortic atherosclerosis (HCC) 08/04/2020   Complex regional pain syndrome type I of the upper limb 07/26/2020   Flexion contracture of joint of hand 07/26/2020   Pacemaker 07/26/2020   Rib pain on left side 06/09/2020   Decreased pulses in feet 06/09/2020   Erectile dysfunction 03/03/2017   Chronic headaches  02/13/2017   Snoring 02/15/2015   Elevated liver enzymes 09/13/2014   Dupuytren's contracture of both hands 09/02/2013   Stiffness of right hand joint 05/07/2013   Gout 12/27/2011   Essential hypertension 01/12/2011   Complete heart block (HCC) - PPM 01/12/2011   Hyperglycemia 03/02/2009   HYPERPLASIA PROSTATE UNS W/O UR OBST & OTH LUTS 08/13/2008   Hyperlipidemia 02/17/2008   PAROXYSMAL SUPRAVENTRICULAR TACHYCARDIA 02/17/2008    PCP:    Pincus Sanes, MD    REFERRING PROVIDER: Rodolph Bong, MD   REFERRING DIAG:  Diagnosis  R07.81 (ICD-10-CM) - Rib pain   L sided  Rationale for Evaluation and Treatment: Rehabilitation  THERAPY DIAG:  Pain, lumbar region  Difficulty walking  Muscle weakness (generalized)  ONSET DATE: Fall 2023  SUBJECTIVE:  SUBJECTIVE STATEMENT:  Pt states that his back hurt this morning and he had to "spray" it to help with the pain. Pt reports his pain is still more R sided with discomfort into the R LB as well as bilat SIJ/hips.    Eval:  Pt states he is back today for his history of LBP. He no longer needs to sit on ice but the back hurts badly. He had SIJ injection from Dr. Denyse Amass that has given partial relief but it is still uncomfortable after being still for a while. Walking longer distances still hurts. He will be leaving for the beach for a month. Pt has not returned to previous HEP due to recent pain. Pt would like to restart exercise with PT. Pt feels that the injections help to reduce his pain. Luckily, the pain has progressed but has been managed with medical intervention. Pt states that walking back and forth from the parking lot twice will increase pain.   Pt is well known to evaluating PT for history of lumbar pain and radiculopathy.  PERTINENT  HISTORY:  A-fib, on blood thinners  PAIN:  Are you having pain? no: NPRS scale: 0/10; worst 7/10 Pain location: Mostly R Pain description: sharp Aggravating factors: coughing, deep breath Relieving factors: rest  PRECAUTIONS: ICD/Pacemaker  WEIGHT BEARING RESTRICTIONS: No  FALLS:  Has patient fallen in last 6 months? No  LIVING ENVIRONMENT: Lives with: lives with their spouse Lives in: House/apartment Stairs: Yes to enter Has following equipment at home: None  OCCUPATION: retired  PLOF: Independent with basic ADLs  PATIENT GOALS: reduce pain   OBJECTIVE:   DIAGNOSTIC FINDINGS:   IMPRESSION: 1. Advanced lumbar disc and facet degeneration, greatest at L3-4 where there is moderate spinal stenosis and severe right neural stenosis. 2. Moderate bilateral neural foraminal stenosis at L4-5. 3. Mild spinal stenosis at L2-3. 4. Aortic Atherosclerosis (ICD10-I70.0).    TODAY'S TREATMENT:                                                                                                                              DATE:   8/1   STM R L2-5 lumbar paraspinals and QL; STM R gluteals, deep hip rotators L2-5 UPA grade III in S/L   Wound management- edu provided; see assessment for further details   7/1  STM R L2-5 lumbar paraspinals and QL; STM R gluteals, deep hip rotators L2-5 UPA grade III in S/L (unable to do prone)  Supine piriformis stretching 30s 3x each STS 5x from raised height table    5/28   STM bilat lumbar paraspinals and QL   Exercises - Supine Lower Trunk Rotation  - 2 x daily - 7 x weekly - 2 sets - 10 reps - 2 hold - Supine Posterior Pelvic Tilt  - 2 x daily - 7 x weekly - 2 sets - 10 reps - 2 hold - Supine Piriformis Stretch with Foot on Ground  - 2  x daily - 7 x weekly - 1 sets - 3 reps - 30 hold - Seated Quadratus Lumborum Stretch in Chair  - 2 x daily - 7 x weekly - 1 sets - 3 reps - 30 hold    PATIENT EDUCATION:  Education details: MOI,  diagnosis, prognosis, anatomy, exercise progression, DOMS expectations, muscle firing,  envelope of function, HEP, POC  Person educated: Patient Education method: Explanation, Demonstration, Tactile cues, Verbal cues, and Handouts Education comprehension: verbalized understanding, returned demonstration, verbal cues required, tactile cues required, and needs further education  HOME EXERCISE PROGRAM:  Access Code: [facility] URL: [email or web address] Date: 04/10/2023 Prepared by: Zebedee Iba   ASSESSMENT:  CLINICAL IMPRESSION:  Pt presents with increase in bilat LBP today with good tolerance to manual therapy and STM of paraspinals and hip musculature across the SIJ. During prone to sit transfer, pt's skin at the L knee did tear from frictional abrasion against table. Mild bleeding took place. Wound area cleaned, sterile gauze applied, and Tegaderm placed on top. Pt given edu about cleaning and dressing wound once he gets home. Given edu about infection symptoms. Plan to continue with exercise as tolerated at future session with manual PRN. Consider hip ABD isometric and hip ER based motions at next for HEP.  Pt would benefit from continued skilled therapy in order to reach goals and maximize functional T/S and rib function for return to PLOF.    OBJECTIVE IMPAIRMENTS: decreased activity tolerance, decreased endurance, decreased mobility, decreased ROM, decreased strength, hypomobility, increased muscle spasms, impaired flexibility, improper body mechanics, postural dysfunction, and pain.   ACTIVITY LIMITATIONS: carrying, lifting, sleeping, transfers, dressing, and reach over head  PARTICIPATION LIMITATIONS: cleaning, laundry, community activity, and exercise  PERSONAL FACTORS: Age, Fitness, Time since onset of injury/illness/exacerbation, and 3+ comorbidities:    are also affecting patient's functional outcome.   REHAB POTENTIAL: Fair  CLINICAL DECISION MAKING:  Stable/uncomplicated  EVALUATION COMPLEXITY: Low   SHORT TERM GOALS: Target date: 05/22/2023   Pt will become independent with HEP in order to demonstrate synthesis of PT education.   Goal status: INITIAL  2.  Pt will report at least 2 pt reduction on NPRS scale for pain in order to demonstrate functional improvement with household activity, self care, and ADL.   Goal status: INITIAL  3.  Pt will demonstrate at least a 12.8 improvement in Oswestry Index in order to demonstrate a clinically significant change in LBP and function. .    Goal status: INITIAL  LONG TERM GOALS: Target date: 07/03/2023    Pt  will become independent with final HEP in order to demonstrate synthesis of PT education.  Baseline:  Goal status: INITIAL  2. Pt will be able to demonstrate DL squat to chair height without pain demonstrate functional improvement in bilat LE strength for return to PLOF and exercise.  Baseline:  Goal status: INITIAL  3.  Pt will be able to perform 5XSTS in under 12s  in order to demonstrate functional improvement above the cut off score for adults.  Goal status: INITIAL  4.  Pt will be able to demonstrate/report ability to walk >15 mins without pain in order to demonstrate functional improvement and tolerance to exercise and community mobility.   Goal status: INITIAL PLAN:  PT FREQUENCY: 1x/week  PT DURATION: 12 weeks (likely to D/C by 8 with independent management)  PLANNED INTERVENTIONS: Therapeutic exercises, Therapeutic activity, Neuromuscular re-education, Balance training, Gait training, Patient/Family education, Self Care, Joint mobilization, Joint manipulation, Orthotic/Fit training, DME instructions,  Aquatic Therapy, Dry Needling, Electrical stimulation, Spinal manipulation, Spinal mobilization, Cryotherapy, Moist heat, scar mobilization, Splintting, Taping, Vasopneumatic device, Traction, Ultrasound, Ionotophoresis 4mg /ml Dexamethasone, Manual therapy, and  Re-evaluation.  PLAN FOR NEXT SESSION: stretching program, consider yoga, manual PRN,    Zebedee Iba, PT 06/14/2023, 12:46 PM

## 2023-06-21 ENCOUNTER — Encounter (HOSPITAL_BASED_OUTPATIENT_CLINIC_OR_DEPARTMENT_OTHER): Payer: Self-pay | Admitting: Physical Therapy

## 2023-06-21 ENCOUNTER — Ambulatory Visit (HOSPITAL_BASED_OUTPATIENT_CLINIC_OR_DEPARTMENT_OTHER): Payer: PPO | Admitting: Physical Therapy

## 2023-06-21 DIAGNOSIS — M545 Low back pain, unspecified: Secondary | ICD-10-CM | POA: Diagnosis not present

## 2023-06-21 DIAGNOSIS — M6281 Muscle weakness (generalized): Secondary | ICD-10-CM

## 2023-06-21 DIAGNOSIS — R262 Difficulty in walking, not elsewhere classified: Secondary | ICD-10-CM

## 2023-06-21 NOTE — Therapy (Signed)
OUTPATIENT PHYSICAL THERAPY THORACOLUMBAR TREATMENT   Patient Name: [name] MRN: [id number] DOB:1946-01-15, 77 y.o., male Today's Date: 06/21/2023  END OF SESSION:  PT End of Session - 06/21/23 1147     Visit Number 4    Number of Visits 16    Date for PT Re-Evaluation 07/09/23    Authorization Type HTA    PT Start Time 1146    PT Stop Time 1225    PT Time Calculation (min) 39 min    Activity Tolerance Patient tolerated treatment well    Behavior During Therapy WFL for tasks assessed/performed             Past Medical History:  Diagnosis Date   Complete heart block (HCC)    S/P  PPM by Dr [name]   Coronary artery disease    Non-obstructive. Pt had left heart catheterization in August 2009 showing a 30% mid-circumflex lesion, otherwise coronaries were clean angiographically   Hyperlipidemia    Hypertension    OSA (obstructive sleep apnea)    Probable; study never scheduled   Past Surgical History:  Procedure Laterality Date   CARDIAC CATHETERIZATION  1993, 2009   2009- negative   COLONOSCOPY  2011   negative, [facility]   PACEMAKER INSERTION  01/13/11   MDT by Fawn Kirk for complete heart block   Patient Active Problem List   Diagnosis Date Noted   Elevated PSA 03/14/2023   Elevated LFTs 03/14/2023   Atrial flutter (HCC) 02/13/2023   Chronic left SI joint pain 12/22/2022   DOE (dyspnea on exertion) 12/22/2022   Chest pain, atypical 10/10/2022   Upper airway cough syndrome 10/10/2022   Chronic cough 02/01/2022   Chronic right SI joint pain 05/11/2021   Umbilical hernia without obstruction or gangrene 02/02/2021   Hepatic steatosis 08/05/2020   Aortic atherosclerosis (HCC) 08/04/2020   Complex regional pain syndrome type I of the upper limb 07/26/2020   Flexion contracture of joint of hand 07/26/2020   Pacemaker 07/26/2020   Rib pain on left side 06/09/2020   Decreased pulses in feet 06/09/2020   Erectile dysfunction 03/03/2017   Chronic headaches  02/13/2017   Snoring 02/15/2015   Elevated liver enzymes 09/13/2014   Dupuytren's contracture of both hands 09/02/2013   Stiffness of right hand joint 05/07/2013   Gout 12/27/2011   Essential hypertension 01/12/2011   Complete heart block (HCC) - PPM 01/12/2011   Hyperglycemia 03/02/2009   HYPERPLASIA PROSTATE UNS W/O UR OBST & OTH LUTS 08/13/2008   Hyperlipidemia 02/17/2008   PAROXYSMAL SUPRAVENTRICULAR TACHYCARDIA 02/17/2008    PCP:    Pincus Sanes, MD    REFERRING PROVIDER: Rodolph Bong, MD   REFERRING DIAG:  Diagnosis  R07.81 (ICD-10-CM) - Rib pain   L sided  Rationale for Evaluation and Treatment: Rehabilitation  THERAPY DIAG:  Pain, lumbar region  Difficulty walking  Muscle weakness (generalized)  ONSET DATE: Fall 2023  SUBJECTIVE:  SUBJECTIVE STATEMENT:  Pt states that his back hurt this morning and he had to "spray" it to help with the pain. Pt reports his pain is still more R sided with discomfort into the R LB as well as bilat SIJ/hips.    Eval:  Pt states he is back today for his history of LBP. He no longer needs to sit on ice but the back hurts badly. He had SIJ injection from Dr. Denyse Amass that has given partial relief but it is still uncomfortable after being still for a while. Walking longer distances still hurts. He will be leaving for the beach for a month. Pt has not returned to previous HEP due to recent pain. Pt would like to restart exercise with PT. Pt feels that the injections help to reduce his pain. Luckily, the pain has progressed but has been managed with medical intervention. Pt states that walking back and forth from the parking lot twice will increase pain.   Pt is well known to evaluating PT for history of lumbar pain and radiculopathy.  PERTINENT  HISTORY:  A-fib, on blood thinners  PAIN:  Are you having pain? Yes NPRS scale: 1-2/10; worst 7/10 Pain location: Mostly R Pain description: sharp Aggravating factors: coughing, deep breath Relieving factors: rest  PRECAUTIONS: ICD/Pacemaker  WEIGHT BEARING RESTRICTIONS: No  FALLS:  Has patient fallen in last 6 months? No  LIVING ENVIRONMENT: Lives with: lives with their spouse Lives in: House/apartment Stairs: Yes to enter Has following equipment at home: None  OCCUPATION: retired  PLOF: Independent with basic ADLs  PATIENT GOALS: reduce pain   OBJECTIVE:   DIAGNOSTIC FINDINGS:   IMPRESSION: 1. Advanced lumbar disc and facet degeneration, greatest at L3-4 where there is moderate spinal stenosis and severe right neural stenosis. 2. Moderate bilateral neural foraminal stenosis at L4-5. 3. Mild spinal stenosis at L2-3. 4. Aortic Atherosclerosis (ICD10-I70.0).   TODAY'S TREATMENT:                                                                                                                              DATE:   8/8  (In sidelying)  STM R L2-5 lumbar paraspinals and QL; STM R gluteals, deep hip rotators L2-5 UPA grade III in S/L   LTR 2x10 Half bridge 2x10 Fwd flexion with ball and L SB 5s 10x Seated figure 4 30s 3x Nustep L5 8 min   8/1   STM R L2-5 lumbar paraspinals and QL; STM R gluteals, deep hip rotators L2-5 UPA grade III in S/L   Wound management- edu provided; see assessment for further details   7/1  STM R L2-5 lumbar paraspinals and QL; STM R gluteals, deep hip rotators L2-5 UPA grade III in S/L (unable to do prone)  Supine piriformis stretching 30s 3x each STS 5x from raised height table    5/28   STM bilat lumbar paraspinals and QL   Exercises - Supine Lower Trunk Rotation  -  2 x daily - 7 x weekly - 2 sets - 10 reps - 2 hold - Supine Posterior Pelvic Tilt  - 2 x daily - 7 x weekly - 2 sets - 10 reps - 2 hold - Supine  Piriformis Stretch with Foot on Ground  - 2 x daily - 7 x weekly - 1 sets - 3 reps - 30 hold - Seated Quadratus Lumborum Stretch in Chair  - 2 x daily - 7 x weekly - 1 sets - 3 reps - 30 hold    PATIENT EDUCATION:  Education details: anatomy, exercise progression, DOMS expectations, muscle firing,  envelope of function, HEP, POC  Person educated: Patient Education method: Explanation, Demonstration, Tactile cues, Verbal cues, and Handouts Education comprehension: verbalized understanding, returned demonstration, verbal cues required, tactile cues required, and needs further education  HOME EXERCISE PROGRAM:  Access Code: [facility] URL: [email or web address] Date: 04/10/2023 Prepared by: Zebedee Iba   ASSESSMENT:  CLINICAL IMPRESSION:  Pt had good response to session with report of decreased stiffness and pain. Pt able to tolerate longer duration of exercise as well as more lumbopelvic mobility following manual therapy. Pt had abolishment of pain by end of session. Plan to continue with manual PRN and continue with generalized lumbopelvic motion as tolerated. Pt advised to utilized clubhouse gym for use of cardiovascular equipment as current evidence supports this for LBP. Pt would benefit from continued skilled therapy in order to reach goals and maximize functional T/S and rib function for return to PLOF.    OBJECTIVE IMPAIRMENTS: decreased activity tolerance, decreased endurance, decreased mobility, decreased ROM, decreased strength, hypomobility, increased muscle spasms, impaired flexibility, improper body mechanics, postural dysfunction, and pain.   ACTIVITY LIMITATIONS: carrying, lifting, sleeping, transfers, dressing, and reach over head  PARTICIPATION LIMITATIONS: cleaning, laundry, community activity, and exercise  PERSONAL FACTORS: Age, Fitness, Time since onset of injury/illness/exacerbation, and 3+ comorbidities:    are also affecting patient's functional  outcome.   REHAB POTENTIAL: Fair  CLINICAL DECISION MAKING: Stable/uncomplicated  EVALUATION COMPLEXITY: Low   SHORT TERM GOALS: Target date: 05/22/2023   Pt will become independent with HEP in order to demonstrate synthesis of PT education.   Goal status: INITIAL  2.  Pt will report at least 2 pt reduction on NPRS scale for pain in order to demonstrate functional improvement with household activity, self care, and ADL.   Goal status: INITIAL  3.  Pt will demonstrate at least a 12.8 improvement in Oswestry Index in order to demonstrate a clinically significant change in LBP and function. .    Goal status: INITIAL  LONG TERM GOALS: Target date: 07/03/2023    Pt  will become independent with final HEP in order to demonstrate synthesis of PT education.  Baseline:  Goal status: INITIAL  2. Pt will be able to demonstrate DL squat to chair height without pain demonstrate functional improvement in bilat LE strength for return to PLOF and exercise.  Baseline:  Goal status: INITIAL  3.  Pt will be able to perform 5XSTS in under 12s  in order to demonstrate functional improvement above the cut off score for adults.  Goal status: INITIAL  4.  Pt will be able to demonstrate/report ability to walk >15 mins without pain in order to demonstrate functional improvement and tolerance to exercise and community mobility.   Goal status: INITIAL PLAN:  PT FREQUENCY: 1x/week  PT DURATION: 12 weeks (likely to D/C by 8 with independent management)  PLANNED INTERVENTIONS: Therapeutic exercises, Therapeutic activity, Neuromuscular  re-education, Balance training, Gait training, Patient/Family education, Self Care, Joint mobilization, Joint manipulation, Orthotic/Fit training, DME instructions, Aquatic Therapy, Dry Needling, Electrical stimulation, Spinal manipulation, Spinal mobilization, Cryotherapy, Moist heat, scar mobilization, Splintting, Taping, Vasopneumatic device, Traction, Ultrasound,  Ionotophoresis 4mg /ml Dexamethasone, Manual therapy, and Re-evaluation.  PLAN FOR NEXT SESSION: stretching program, consider yoga, manual PRN,    Zebedee Iba, PT 06/21/2023, 12:29 PM

## 2023-06-25 ENCOUNTER — Encounter: Payer: Self-pay | Admitting: Family Medicine

## 2023-06-27 ENCOUNTER — Ambulatory Visit (INDEPENDENT_AMBULATORY_CARE_PROVIDER_SITE_OTHER): Payer: PPO

## 2023-06-27 DIAGNOSIS — I442 Atrioventricular block, complete: Secondary | ICD-10-CM

## 2023-06-27 LAB — CUP PACEART REMOTE DEVICE CHECK
Battery Impedance: 3587 Ohm
Battery Remaining Longevity: 20 mo
Battery Voltage: 2.7 V
Brady Statistic AP VP Percent: 2 %
Brady Statistic AP VS Percent: 0 %
Brady Statistic AS VP Percent: 98 %
Brady Statistic AS VS Percent: 0 %
Date Time Interrogation Session: 20240814122616
Implantable Lead Connection Status: 753985
Implantable Lead Connection Status: 753985
Implantable Lead Implant Date: 20120302
Implantable Lead Implant Date: 20120302
Implantable Lead Location: 753859
Implantable Lead Location: 753860
Implantable Lead Model: 5076
Implantable Pulse Generator Implant Date: 20120302
Lead Channel Impedance Value: 605 Ohm
Lead Channel Impedance Value: 617 Ohm
Lead Channel Pacing Threshold Amplitude: 0.5 V
Lead Channel Pacing Threshold Amplitude: 0.75 V
Lead Channel Pacing Threshold Pulse Width: 0.4 ms
Lead Channel Pacing Threshold Pulse Width: 0.4 ms
Lead Channel Setting Pacing Amplitude: 2 V
Lead Channel Setting Pacing Amplitude: 2 V
Lead Channel Setting Pacing Pulse Width: 0.4 ms
Lead Channel Setting Sensing Sensitivity: 2.8 mV
Zone Setting Status: 755011
Zone Setting Status: 755011

## 2023-06-28 ENCOUNTER — Ambulatory Visit: Payer: PPO | Admitting: Adult Health

## 2023-06-28 ENCOUNTER — Ambulatory Visit (HOSPITAL_BASED_OUTPATIENT_CLINIC_OR_DEPARTMENT_OTHER): Payer: PPO | Admitting: Physical Therapy

## 2023-06-28 ENCOUNTER — Encounter: Payer: Self-pay | Admitting: Adult Health

## 2023-06-28 ENCOUNTER — Encounter (HOSPITAL_BASED_OUTPATIENT_CLINIC_OR_DEPARTMENT_OTHER): Payer: Self-pay | Admitting: Physical Therapy

## 2023-06-28 VITALS — BP 114/70 | HR 108 | Temp 97.5°F | Ht 77.0 in | Wt 234.8 lb

## 2023-06-28 DIAGNOSIS — M545 Low back pain, unspecified: Secondary | ICD-10-CM

## 2023-06-28 DIAGNOSIS — R0609 Other forms of dyspnea: Secondary | ICD-10-CM

## 2023-06-28 DIAGNOSIS — R058 Other specified cough: Secondary | ICD-10-CM

## 2023-06-28 DIAGNOSIS — R262 Difficulty in walking, not elsewhere classified: Secondary | ICD-10-CM

## 2023-06-28 DIAGNOSIS — R053 Chronic cough: Secondary | ICD-10-CM

## 2023-06-28 DIAGNOSIS — M6281 Muscle weakness (generalized): Secondary | ICD-10-CM

## 2023-06-28 NOTE — Therapy (Addendum)
OUTPATIENT PHYSICAL THERAPY THORACOLUMBAR TREATMENT   Patient Name: [name] MRN: [id number] DOB:[birth year], 77 y.o., male Today's Date: 06/28/2023  END OF SESSION:  PT End of Session - 06/28/23 1020     Visit Number 5    Number of Visits 16    Date for PT Re-Evaluation 07/09/23    Authorization Type HTA    PT Start Time 1015    PT Stop Time 1055    PT Time Calculation (min) 40 min    Activity Tolerance Patient tolerated treatment well    Behavior During Therapy WFL for tasks assessed/performed              Past Medical History:  Diagnosis Date   Complete heart block (HCC)    S/P  PPM by Dr [name]   Coronary artery disease    Non-obstructive. Pt had left heart catheterization in August 2009 showing a 30% mid-circumflex lesion, otherwise coronaries were clean angiographically   Hyperlipidemia    Hypertension    OSA (obstructive sleep apnea)    Probable; study never scheduled   Past Surgical History:  Procedure Laterality Date   CARDIAC CATHETERIZATION  1993, 2009   2009- negative   COLONOSCOPY  2011   negative, [facility]   PACEMAKER INSERTION  01/13/11   MDT by Fawn Kirk for complete heart block   Patient Active Problem List   Diagnosis Date Noted   Elevated PSA 03/14/2023   Elevated LFTs 03/14/2023   Atrial flutter (HCC) 02/13/2023   Chronic left SI joint pain 12/22/2022   DOE (dyspnea on exertion) 12/22/2022   Chest pain, atypical 10/10/2022   Upper airway cough syndrome 10/10/2022   Chronic cough 02/01/2022   Chronic right SI joint pain 05/11/2021   Umbilical hernia without obstruction or gangrene 02/02/2021   Hepatic steatosis 08/05/2020   Aortic atherosclerosis (HCC) 08/04/2020   Complex regional pain syndrome type I of the upper limb 07/26/2020   Flexion contracture of joint of hand 07/26/2020   Pacemaker 07/26/2020   Rib pain on left side 06/09/2020   Decreased pulses in feet 06/09/2020   Erectile dysfunction 03/03/2017   Chronic headaches  02/13/2017   Snoring 02/15/2015   Elevated liver enzymes 09/13/2014   Dupuytren's contracture of both hands 09/02/2013   Stiffness of right hand joint 05/07/2013   Gout 12/27/2011   Essential hypertension 01/12/2011   Complete heart block (HCC) - PPM 01/12/2011   Hyperglycemia 03/02/2009   HYPERPLASIA PROSTATE UNS W/O UR OBST & OTH LUTS 08/13/2008   Hyperlipidemia 02/17/2008   PAROXYSMAL SUPRAVENTRICULAR TACHYCARDIA 02/17/2008    PCP:    Pincus Sanes, MD    REFERRING PROVIDER: Rodolph Bong, MD   REFERRING DIAG:  Diagnosis  R07.81 (ICD-10-CM) - Rib pain   L sided  Rationale for Evaluation and Treatment: Rehabilitation  THERAPY DIAG:  Pain, lumbar region  Difficulty walking  Muscle weakness (generalized)  ONSET DATE: Fall 2023  SUBJECTIVE:  SUBJECTIVE STATEMENT:  Pt states the back just feels stiff this morning. He felt better with movement after last session.    Eval:  Pt states he is back today for his history of LBP. He no longer needs to sit on ice but the back hurts badly. He had SIJ injection from Dr. Denyse Amass that has given partial relief but it is still uncomfortable after being still for a while. Walking longer distances still hurts. He will be leaving for the beach for a month. Pt has not returned to previous HEP due to recent pain. Pt would like to restart exercise with PT. Pt feels that the injections help to reduce his pain. Luckily, the pain has progressed but has been managed with medical intervention. Pt states that walking back and forth from the parking lot twice will increase pain.   Pt is well known to evaluating PT for history of lumbar pain and radiculopathy.  PERTINENT HISTORY:  A-fib, on blood thinners  PAIN:  Are you having pain? Yes NPRS scale: 1-2/10;  worst 7/10 Pain location: Mostly R Pain description: sharp Aggravating factors: coughing, deep breath Relieving factors: rest  PRECAUTIONS: ICD/Pacemaker  WEIGHT BEARING RESTRICTIONS: No  FALLS:  Has patient fallen in last 6 months? No  LIVING ENVIRONMENT: Lives with: lives with their spouse Lives in: House/apartment Stairs: Yes to enter Has following equipment at home: None  OCCUPATION: retired  PLOF: Independent with basic ADLs  PATIENT GOALS: reduce pain   OBJECTIVE:   DIAGNOSTIC FINDINGS:   IMPRESSION: 1. Advanced lumbar disc and facet degeneration, greatest at L3-4 where there is moderate spinal stenosis and severe right neural stenosis. 2. Moderate bilateral neural foraminal stenosis at L4-5. 3. Mild spinal stenosis at L2-3. 4. Aortic Atherosclerosis (ICD10-I70.0).   TODAY'S TREATMENT:                                                                                                                              DATE:   8/15  Nustep L5 8 min   LTR on swissball 2x20 Bridge 3x10 Swissball HS curl with dig hold 3s 2x10 Fwd flexion with ball L and R SB 2x10 Supine piriformis stretch 30s 3x  Standing SB 5lbs 2x10 Standing hip flexor stretch 30s 3x each    8/8  (In sidelying)  STM R L2-5 lumbar paraspinals and QL; STM R gluteals, deep hip rotators L2-5 UPA grade III in S/L   LTR 2x10 Half bridge 2x10 Fwd flexion with ball and L SB 5s 10x Seated figure 4 30s 3x Nustep L5 8 min   8/1   STM R L2-5 lumbar paraspinals and QL; STM R gluteals, deep hip rotators L2-5 UPA grade III in S/L   Wound management- edu provided; see assessment for further details   7/1  STM R L2-5 lumbar paraspinals and QL; STM R gluteals, deep hip rotators L2-5 UPA grade III in S/L (unable to do prone)  Supine piriformis stretching 30s 3x  each STS 5x from raised height table    5/28   STM bilat lumbar paraspinals and QL   Exercises - Supine Lower Trunk  Rotation  - 2 x daily - 7 x weekly - 2 sets - 10 reps - 2 hold - Supine Posterior Pelvic Tilt  - 2 x daily - 7 x weekly - 2 sets - 10 reps - 2 hold - Supine Piriformis Stretch with Foot on Ground  - 2 x daily - 7 x weekly - 1 sets - 3 reps - 30 hold - Seated Quadratus Lumborum Stretch in Chair  - 2 x daily - 7 x weekly - 1 sets - 3 reps - 30 hold    PATIENT EDUCATION:  Education details: anatomy, exercise progression, DOMS expectations, muscle firing,  envelope of function, HEP, POC  Person educated: Patient Education method: Explanation, Demonstration, Tactile cues, Verbal cues, and Handouts Education comprehension: verbalized understanding, returned demonstration, verbal cues required, tactile cues required, and needs further education  HOME EXERCISE PROGRAM:  Access Code: [facility] URL: [email or web address] Date: 04/10/2023 Prepared by: Zebedee Iba   ASSESSMENT:  CLINICAL IMPRESSION:  Patient exercise session able to progress to all exercise and stretching based movements as patient reports decrease in stiffness following last session.  Patient does have fatigue and weakness with quick onset into the bilateral glutes with bridging and standing exercise demonstrating functional lack of strength.  Patient however was able to tolerate more progressive lumbar mobility and hip mobility exercise today without increase in pain.  Patient only reports stiffness during session with discomfort due to excessive soft tissue restriction.  No numbness tingling recreated during session.  Home exercise program updated to include hip flexor stretch.  Plan to continue with strengthening as tolerated of the lumbopelvic region.  Pt would benefit from continued skilled therapy in order to reach goals and maximize functional T/S and rib function for return to PLOF.    OBJECTIVE IMPAIRMENTS: decreased activity tolerance, decreased endurance, decreased mobility, decreased ROM, decreased strength,  hypomobility, increased muscle spasms, impaired flexibility, improper body mechanics, postural dysfunction, and pain.   ACTIVITY LIMITATIONS: carrying, lifting, sleeping, transfers, dressing, and reach over head  PARTICIPATION LIMITATIONS: cleaning, laundry, community activity, and exercise  PERSONAL FACTORS: Age, Fitness, Time since onset of injury/illness/exacerbation, and 3+ comorbidities:    are also affecting patient's functional outcome.   REHAB POTENTIAL: Fair  CLINICAL DECISION MAKING: Stable/uncomplicated  EVALUATION COMPLEXITY: Low   SHORT TERM GOALS: Target date: 05/22/2023   Pt will become independent with HEP in order to demonstrate synthesis of PT education.   Goal status: INITIAL  2.  Pt will report at least 2 pt reduction on NPRS scale for pain in order to demonstrate functional improvement with household activity, self care, and ADL.   Goal status: INITIAL  3.  Pt will demonstrate at least a 12.8 improvement in Oswestry Index in order to demonstrate a clinically significant change in LBP and function. .    Goal status: INITIAL  LONG TERM GOALS: Target date: 07/03/2023    Pt  will become independent with final HEP in order to demonstrate synthesis of PT education.  Baseline:  Goal status: INITIAL  2. Pt will be able to demonstrate DL squat to chair height without pain demonstrate functional improvement in bilat LE strength for return to PLOF and exercise.  Baseline:  Goal status: INITIAL  3.  Pt will be able to perform 5XSTS in under 12s  in order to demonstrate functional improvement above the  cut off score for adults.  Goal status: INITIAL  4.  Pt will be able to demonstrate/report ability to walk >15 mins without pain in order to demonstrate functional improvement and tolerance to exercise and community mobility.   Goal status: INITIAL PLAN:  PT FREQUENCY: 1x/week  PT DURATION: 12 weeks (likely to D/C by 8 with independent management)  PLANNED  INTERVENTIONS: Therapeutic exercises, Therapeutic activity, Neuromuscular re-education, Balance training, Gait training, Patient/Family education, Self Care, Joint mobilization, Joint manipulation, Orthotic/Fit training, DME instructions, Aquatic Therapy, Dry Needling, Electrical stimulation, Spinal manipulation, Spinal mobilization, Cryotherapy, Moist heat, scar mobilization, Splintting, Taping, Vasopneumatic device, Traction, Ultrasound, Ionotophoresis 4mg /ml Dexamethasone, Manual therapy, and Re-evaluation.  PLAN FOR NEXT SESSION: stretching program, consider yoga, manual PRN,    Zebedee Iba, PT 06/28/2023, 10:59 AM

## 2023-06-28 NOTE — Progress Notes (Signed)
@Patient  ID: [name], male    DOB: 03/27/1946, 77 y.o.   MRN: [id number]  Chief Complaint  Patient presents with   Follow-up    Referring provider: Pincus Sanes, MD  HPI: 77 year old male former smoker seen for pulmonary consult November 2023 for chronic cough since January 2023  TEST/EVENTS :  High-resolution CT chest December 15, 2022 showed very subtle subpleural reticulations with no clear craniocaudal predominance. Indeterminate for UIP. Stable nodule since 2006 in RLL   4/20204 PFTs today show no significant airflow obstruction and only minimal restriction. FEV1 is 80%, ratio 74, FVC a 79%, DLCO 89%.   06/28/2023 Follow up : Chronic cough  Patient presents for 70-month follow-up.  Patient was seen in November for a chronic cough that been going on since January 2023.  Initially tried on gabapentin without perceived benefit.  He was also given Stiolto without any improvement.  Pulmonary function testing in April 2024 showed no significant airflow obstruction and only minimum restriction.  Diffusing capacity was normal.  Previous high-resolution CT chest showed very subtle subpleural reticulation.  He was treated for possible chronic rhinitis and GERD component for cough.  Given Delsym and Tessalon Perles for cough control.  Patient says his cough is improved.  Still has some lingering cough that seems to be mainly in the morning hours.  But overall definitely feels that he is better.  He remains very active.  Denies any significant shortness of breath.  No Known Allergies  Immunization History  Administered Date(s) Administered   COVID-19, mRNA, vaccine(Comirnaty)12 years and older 08/25/2022   Fluad Quad(high Dose 65+) 08/05/2019, 08/05/2020, 08/04/2021, 08/09/2022   Influenza Split 08/29/2011, 08/20/2012   Influenza Whole 10/03/2007, 08/13/2008, 08/18/2009, 09/12/2010   Influenza, High Dose Seasonal PF 08/26/2013, 09/14/2016, 07/26/2017, 08/15/2018   Influenza,inj,Quad  PF,6+ Mos 09/08/2014, 08/31/2015   PFIZER Comirnaty(Gray Top)Covid-19 Tri-Sucrose Vaccine 03/23/2021, 08/25/2022   PFIZER(Purple Top)SARS-COV-2 Vaccination 12/28/2019, 01/20/2020, 09/25/2020   Pfizer Covid-19 Vaccine Bivalent Booster 2yrs & up 08/16/2021   Pneumococcal Conjugate-13 07/14/2015   Pneumococcal Polysaccharide-23 10/02/2011, 09/02/2013   Respiratory Syncytial Virus Vaccine,Recomb Aduvanted(Arexvy) 09/27/2022   Tdap 08/20/2012, 12/15/2022   Zoster Recombinant(Shingrix) 12/11/2017, 02/11/2018   Zoster, Live 12/29/2009    Past Medical History:  Diagnosis Date   Complete heart block (HCC)    S/P  PPM by Dr [name]   Coronary artery disease    Non-obstructive. Pt had left heart catheterization in August 2009 showing a 30% mid-circumflex lesion, otherwise coronaries were clean angiographically   Hyperlipidemia    Hypertension    OSA (obstructive sleep apnea)    Probable; study never scheduled    Tobacco History: Social History   Tobacco Use  Smoking Status Former   Current packs/day: 0.00   Average packs/day: 2.0 packs/day for 30.0 years (60.0 ttl pk-yrs)   Types: Cigarettes   Start date: 11/13/1973   Quit date: 11/14/2003   Years since quitting: 19.6  Smokeless Tobacco Never  Tobacco Comments   smoked 1965- 2007, up to 2 ppd   Counseling given: Not Answered Tobacco comments: smoked 1965- 2007, up to 2 ppd   Outpatient Medications Prior to Visit  Medication Sig Dispense Refill   albuterol (VENTOLIN HFA) 108 (90 Base) MCG/ACT inhaler INHALE 1-2 PUFFS BY MOUTH INTO THE LUNGS EVERY 6 HOURS AS NEEDED. 8.5 each 2   allopurinol (ZYLOPRIM) 300 MG tablet TAKE 1/2 TABLET BY MOUTH DAILY 45 tablet 1   apixaban (ELIQUIS) 5 MG TABS tablet Take 1 tablet (5 mg  total) by mouth 2 (two) times daily. 180 tablet 2   benzonatate (TESSALON) 200 MG capsule TAKE 1 CAPSULE BY MOUTH THREE TIMES A DAY AS NEEDED 45 capsule 1   famotidine (PEPCID) 20 MG tablet One after supper 30 tablet 11    gabapentin (NEURONTIN) 300 MG capsule Take 1 capsule (300 mg total) by mouth 3 (three) times daily. (Patient taking differently: Take 300 mg by mouth 2 (two) times daily.) 90 capsule 2   pantoprazole (PROTONIX) 40 MG tablet TAKE 1 TABLET (40 MG TOTAL) BY MOUTH DAILY. TAKE 30-60 MIN BEFORE FIRST MEAL OF THE DAY 90 tablet 3   sildenafil (REVATIO) 20 MG tablet TAKE 3 TO 5 TABLETS BY MOUTH AS NEEDED 100 tablet 0   simvastatin (ZOCOR) 40 MG tablet Take 1 tablet (40 mg total) by mouth daily at 6 PM. 90 tablet 2   Tiotropium Bromide-Olodaterol (STIOLTO RESPIMAT) 2.5-2.5 MCG/ACT AERS Inhale 2 puffs into the lungs daily. 1 each 5   valsartan (DIOVAN) 80 MG tablet Take 1 tablet (80 mg total) by mouth daily. (Patient taking differently: Take 40 mg by mouth daily. Patient cuts 80 mg tablet in half  Takes 40 mg) 30 tablet 11   No facility-administered medications prior to visit.     Review of Systems:   Constitutional:   No  weight loss, night sweats,  Fevers, chills, fatigue, or  lassitude.  HEENT:   No headaches,  Difficulty swallowing,  Tooth/dental problems, or  Sore throat,                No sneezing, itching, ear ache, nasal congestion, post nasal drip,   CV:  No chest pain,  Orthopnea, PND, swelling in lower extremities, anasarca, dizziness, palpitations, syncope.   GI  No heartburn, indigestion, abdominal pain, nausea, vomiting, diarrhea, change in bowel habits, loss of appetite, bloody stools.   Resp: No shortness of breath with exertion or at rest.  No change in color of mucus.  No wheezing.  No chest wall deformity  Skin: no rash or lesions.  GU: no dysuria, change in color of urine, no urgency or frequency.  No flank pain, no hematuria   MS:  No joint pain or swelling.  No decreased range of motion.  No back pain.    Physical Exam  BP 114/70 (BP Location: Left Arm, Patient Position: Sitting, Cuff Size: Normal)   Pulse (!) 108   Temp (!) 97.5 F (36.4 C) (Oral)   Ht 6\' 5"  (1.956  m)   Wt 234 lb 12.8 oz (106.5 kg)   SpO2 97%   BMI 27.84 kg/m   GEN: A/Ox3; pleasant , NAD, well nourished    HEENT:  [place]/AT,   NOSE-clear, THROAT-clear, no lesions, no postnasal drip or exudate noted.   NECK:  Supple w/ fair ROM; no JVD; normal carotid impulses w/o bruits; no thyromegaly or nodules palpated; no lymphadenopathy.    RESP  Clear  P & A; w/o, wheezes/ rales/ or rhonchi. no accessory muscle use, no dullness to percussion  CARD:  RRR, no m/r/g, tr  peripheral edema, pulses intact, no cyanosis or clubbing.  GI:   Soft & nt; nml bowel sounds; no organomegaly or masses detected.   Musco: Warm bil, no deformities or joint swelling noted.   Neuro: alert, no focal deficits noted.    Skin: Warm, no lesions or rashes    Lab Results:   BMET   BNP No results found for: "BNP"  ProBNP No results found  for: "PROBNP"  Imaging: CUP PACEART REMOTE DEVICE CHECK  Result Date: 06/27/2023 Scheduled remote reviewed. Normal device function.  Hx of AF/AFL, controlled rates Burden 62.1%, stable, Eliquis per PA report Next remote 91 days. LA, CVRS   Administration History     None          Latest Ref Rng & Units 02/14/2023    9:11 AM  PFT Results  FVC-Pre L 4.35   FVC-Predicted Pre % 79   FVC-Post L 4.32   FVC-Predicted Post % 78   Pre FEV1/FVC % % 74   Post FEV1/FCV % % 59   FEV1-Pre L 3.23   FEV1-Predicted Pre % 80   FEV1-Post L 2.54   DLCO uncorrected ml/min/mmHg 27.68   DLCO UNC% % 89   DLCO corrected ml/min/mmHg 26.82   DLCO COR %Predicted % 86   DLVA Predicted % 102   TLC L 7.70   TLC % Predicted % 91   RV % Predicted % 106     No results found for: "NITRICOXIDE"      Assessment & Plan:   Upper airway cough syndrome Chronic cough -no perceived benefit on Stiolto. HRCT chest showed very subtle  subpleural reticulation.  PFT showed very minimal restriction and normal diffusing capacity.  We discussed repeating PFTs with a spirometry and DLCO on  return visit  if he has a clinical change or drop in his lung capacity on return visit can repeat his high-res CT chest.  Continue on cough control regimen and trigger prevention.  Plan  Patient Instructions  Albuterol inhaler As needed   Delsym 2 tsp Twice daily  for cough As needed  (OTC ) Tessalon Three times a day  for cough as needed  Claritin 10mg  daily in am (OTC) As needed  drainage , drippy nose  Chlorpheniramine 4mg  At bedtime  (OTC) as needed drainage, drippy nose  Continue on Prilosec and Pepcid  Sips of water to soothe throat , no MINTS.  Follow up in 1 year with Dr. Sherene Sires or  NP and As needed  (Spirometry with DLCO )  Please contact office for sooner follow up if symptoms do not improve or worsen or seek emergency care       Rubye Oaks, NP 06/28/2023

## 2023-06-28 NOTE — Assessment & Plan Note (Signed)
Chronic cough -no perceived benefit on Stiolto. HRCT chest showed very subtle  subpleural reticulation.  PFT showed very minimal restriction and normal diffusing capacity.  We discussed repeating PFTs with a spirometry and DLCO on return visit  if he has a clinical change or drop in his lung capacity on return visit can repeat his high-res CT chest.  Continue on cough control regimen and trigger prevention.  Plan  Patient Instructions  Albuterol inhaler As needed   Delsym 2 tsp Twice daily  for cough As needed  (OTC ) Tessalon Three times a day  for cough as needed  Claritin 10mg  daily in am (OTC) As needed  drainage , drippy nose  Chlorpheniramine 4mg  At bedtime  (OTC) as needed drainage, drippy nose  Continue on Prilosec and Pepcid  Sips of water to soothe throat , no MINTS.  Follow up in 1 year with Dr. Sherene Sires or  NP and As needed  (Spirometry with DLCO )  Please contact office for sooner follow up if symptoms do not improve or worsen or seek emergency care

## 2023-06-28 NOTE — Patient Instructions (Addendum)
Albuterol inhaler As needed   Delsym 2 tsp Twice daily  for cough As needed  (OTC ) Tessalon Three times a day  for cough as needed  Claritin 10mg  daily in am (OTC) As needed  drainage , drippy nose  Chlorpheniramine 4mg  At bedtime  (OTC) as needed drainage, drippy nose  Continue on Prilosec and Pepcid  Sips of water to soothe throat , no MINTS.  Follow up in 1 year with Dr. Sherene Sires or  NP and As needed  (Spirometry with DLCO )  Please contact office for sooner follow up if symptoms do not improve or worsen or seek emergency care

## 2023-07-02 DIAGNOSIS — R3915 Urgency of urination: Secondary | ICD-10-CM | POA: Diagnosis not present

## 2023-07-02 DIAGNOSIS — R3912 Poor urinary stream: Secondary | ICD-10-CM | POA: Diagnosis not present

## 2023-07-02 DIAGNOSIS — R350 Frequency of micturition: Secondary | ICD-10-CM | POA: Diagnosis not present

## 2023-07-03 ENCOUNTER — Other Ambulatory Visit: Payer: Self-pay

## 2023-07-03 ENCOUNTER — Ambulatory Visit: Payer: PPO | Admitting: Family Medicine

## 2023-07-03 ENCOUNTER — Encounter: Payer: Self-pay | Admitting: Family Medicine

## 2023-07-03 VITALS — BP 122/80 | HR 99 | Ht 77.0 in | Wt 235.0 lb

## 2023-07-03 DIAGNOSIS — G8929 Other chronic pain: Secondary | ICD-10-CM | POA: Diagnosis not present

## 2023-07-03 DIAGNOSIS — M533 Sacrococcygeal disorders, not elsewhere classified: Secondary | ICD-10-CM | POA: Diagnosis not present

## 2023-07-03 NOTE — Patient Instructions (Signed)
Thank you for coming in today.   You received an injection today. Seek immediate medical attention if the joint becomes red, extremely painful, or is oozing fluid.  

## 2023-07-03 NOTE — Progress Notes (Signed)
I, Stevenson Clinch, CMA acting as a Neurosurgeon for Clementeen Graham, MD.  [name] is a 77 y.o. male who presents to [facility] at [facility] today for 34-month f/u bilat SI joint pain. Pt was last seen by Dr. Denyse Amass on 03/27/23 and was given repeat bilat SI joint steroid injections and a new PT order was placed. He's completed 5 PT visits.  Today, pt reports continued back pain. PT has been beneficial. Steroid injections have also been helpful in the past. Would like to extend PT visits. Compliant with HEP at times. Using numbing agent for the back pain. Denies new sx with this flare-up.  Dx imaging: 08/10/21 L-spine CT myelogram             05/11/21 R hip & L-spine XR  Pertinent review of systems: No fevers or chills  Relevant historical information: Atrial flutter on blood thinner   Exam:  BP 122/80   Pulse 99   Ht 6\' 5"  (1.956 m)   Wt 235 lb (106.6 kg)   SpO2 100%   BMI 27.87 kg/m  General: Well Developed, well nourished, and in no acute distress.   MSK: L-spine nontender palpation midline.  Tender palpation bilateral SI joints.    Lab and Radiology Results  Procedure: Real-time Ultrasound Guided Injection of right SI joint Device: Philips Affiniti 50G/GE Logiq Images permanently stored and available for review in PACS Verbal informed consent obtained.  Discussed risks and benefits of procedure. Warned about infection, bleeding, hyperglycemia damage to structures among others. Patient expresses understanding and agreement Time-out conducted.   Noted no overlying erythema, induration, or other signs of local infection.   Skin prepped in a sterile fashion.   Local anesthesia: Topical Ethyl chloride.   With sterile technique and under real time ultrasound guidance: 40 mg of Kenalog and 2 mg of Marcaine injected into right SI joint. Fluid seen entering the joint capsule.   Completed without difficulty   Pain immediately resolved suggesting accurate placement  of the medication.   Advised to call if fevers/chills, erythema, induration, drainage, or persistent bleeding.   Images permanently stored and available for review in the ultrasound unit.  Impression: Technically successful ultrasound guided injection.    Procedure: Real-time Ultrasound Guided Injection of left SI joint Device: Philips Affiniti 50G/GE Logiq Images permanently stored and available for review in PACS Verbal informed consent obtained.  Discussed risks and benefits of procedure. Warned about infection, bleeding, hyperglycemia damage to structures among others. Patient expresses understanding and agreement Time-out conducted.   Noted no overlying erythema, induration, or other signs of local infection.   Skin prepped in a sterile fashion.   Local anesthesia: Topical Ethyl chloride.   With sterile technique and under real time ultrasound guidance: 40 mg of Kenalog and 2 mL of Marcaine injected into left SI joint. Fluid seen entering the joint capsule.   Completed without difficulty   Pain immediately resolved suggesting accurate placement of the medication.   Advised to call if fevers/chills, erythema, induration, drainage, or persistent bleeding.   Images permanently stored and available for review in the ultrasound unit.  Impression: Technically successful ultrasound guided injection.        Assessment and Plan: 77 y.o. male with bilateral low back pain and SI joints.  Plan for SI joint injection.  Previous injection was just over 3 months ago and worked quite well in conjunction with physical therapy and home exercise program.  Plan for injection today continued home exercise  program and check back as needed anticipate need for reinjection in about 3 months.   PDMP not reviewed this encounter. Orders Placed This Encounter  Procedures   [place] LIMITED JOINT SPACE STRUCTURES LOW BILAT(NO LINKED CHARGES)    Order Specific Question:   Reason for Exam (SYMPTOM  OR DIAGNOSIS  REQUIRED)    Answer:   SI joint pain    Order Specific Question:   Preferred imaging location?    Answer:   [facility]   No orders of the defined types were placed in this encounter.    Discussed warning signs or symptoms. Please see discharge instructions. Patient expresses understanding.   The above documentation has been reviewed and is accurate and complete Clementeen Graham, M.D.

## 2023-07-04 ENCOUNTER — Ambulatory Visit (HOSPITAL_BASED_OUTPATIENT_CLINIC_OR_DEPARTMENT_OTHER): Payer: PPO | Admitting: Physical Therapy

## 2023-07-04 ENCOUNTER — Encounter (HOSPITAL_BASED_OUTPATIENT_CLINIC_OR_DEPARTMENT_OTHER): Payer: Self-pay | Admitting: Physical Therapy

## 2023-07-04 DIAGNOSIS — R262 Difficulty in walking, not elsewhere classified: Secondary | ICD-10-CM

## 2023-07-04 DIAGNOSIS — M545 Low back pain, unspecified: Secondary | ICD-10-CM | POA: Diagnosis not present

## 2023-07-04 DIAGNOSIS — M6281 Muscle weakness (generalized): Secondary | ICD-10-CM

## 2023-07-04 NOTE — Therapy (Signed)
OUTPATIENT PHYSICAL THERAPY THORACOLUMBAR TREATMENT   Patient Name: STUART CALLAIS MRN: [id number] DOB:21-Dec-1945, 77 y.o., male Today's Date: 07/04/2023  END OF SESSION:  PT End of Session - 07/04/23 1320     Visit Number 6    Number of Visits 16    Date for PT Re-Evaluation 07/09/23    Authorization Type HTA    PT Start Time 1315    PT Stop Time 1353    PT Time Calculation (min) 38 min    Activity Tolerance Patient tolerated treatment well    Behavior During Therapy WFL for tasks assessed/performed               Past Medical History:  Diagnosis Date   Complete heart block (HCC)    S/P  PPM by Dr [name]   Coronary artery disease    Non-obstructive. Pt had left heart catheterization in August 2009 showing a 30% mid-circumflex lesion, otherwise coronaries were clean angiographically   Hyperlipidemia    Hypertension    OSA (obstructive sleep apnea)    Probable; study never scheduled   Past Surgical History:  Procedure Laterality Date   CARDIAC CATHETERIZATION  1993, 2009   2009- negative   COLONOSCOPY  2011   negative, [facility]   PACEMAKER INSERTION  01/13/11   MDT by Fawn Kirk for complete heart block   Patient Active Problem List   Diagnosis Date Noted   Elevated PSA 03/14/2023   Elevated LFTs 03/14/2023   Atrial flutter (HCC) 02/13/2023   Chronic left SI joint pain 12/22/2022   DOE (dyspnea on exertion) 12/22/2022   Chest pain, atypical 10/10/2022   Upper airway cough syndrome 10/10/2022   Chronic cough 02/01/2022   Chronic right SI joint pain 05/11/2021   Umbilical hernia without obstruction or gangrene 02/02/2021   Hepatic steatosis 08/05/2020   Aortic atherosclerosis (HCC) 08/04/2020   Complex regional pain syndrome type I of the upper limb 07/26/2020   Flexion contracture of joint of hand 07/26/2020   Pacemaker 07/26/2020   Rib pain on left side 06/09/2020   Decreased pulses in feet 06/09/2020   Erectile dysfunction 03/03/2017   Chronic headaches  02/13/2017   Snoring 02/15/2015   Elevated liver enzymes 09/13/2014   Dupuytren's contracture of both hands 09/02/2013   Stiffness of right hand joint 05/07/2013   Gout 12/27/2011   Essential hypertension 01/12/2011   Complete heart block (HCC) - PPM 01/12/2011   Hyperglycemia 03/02/2009   HYPERPLASIA PROSTATE UNS W/O UR OBST & OTH LUTS 08/13/2008   Hyperlipidemia 02/17/2008   PAROXYSMAL SUPRAVENTRICULAR TACHYCARDIA 02/17/2008    PCP:    Pincus Sanes, MD    REFERRING PROVIDER: Rodolph Bong, MD   REFERRING DIAG:  Diagnosis  R07.81 (ICD-10-CM) - Rib pain   L sided  Rationale for Evaluation and Treatment: Rehabilitation  THERAPY DIAG:  Pain, lumbar region  Difficulty walking  Muscle weakness (generalized)  ONSET DATE: Fall 2023  SUBJECTIVE:  SUBJECTIVE STATEMENT:  Pt saw MD and got another injection. It feels stiff today but he got better sleep last night.    Eval:  Pt states he is back today for his history of LBP. He no longer needs to sit on ice but the back hurts badly. He had SIJ injection from Dr. Denyse Amass that has given partial relief but it is still uncomfortable after being still for a while. Walking longer distances still hurts. He will be leaving for the beach for a month. Pt has not returned to previous HEP due to recent pain. Pt would like to restart exercise with PT. Pt feels that the injections help to reduce his pain. Luckily, the pain has progressed but has been managed with medical intervention. Pt states that walking back and forth from the parking lot twice will increase pain.   Pt is well known to evaluating PT for history of lumbar pain and radiculopathy.  PERTINENT HISTORY:  A-fib, on blood thinners  PAIN:  Are you having pain? no NPRS scale: 0/10; worst  7/10 Pain location: Mostly R Pain description: sharp Aggravating factors: coughing, deep breath Relieving factors: rest  PRECAUTIONS: ICD/Pacemaker  WEIGHT BEARING RESTRICTIONS: No  FALLS:  Has patient fallen in last 6 months? No  LIVING ENVIRONMENT: Lives with: lives with their spouse Lives in: House/apartment Stairs: Yes to enter Has following equipment at home: None  OCCUPATION: retired  PLOF: Independent with basic ADLs  PATIENT GOALS: reduce pain   OBJECTIVE:   DIAGNOSTIC FINDINGS:   IMPRESSION: 1. Advanced lumbar disc and facet degeneration, greatest at L3-4 where there is moderate spinal stenosis and severe right neural stenosis. 2. Moderate bilateral neural foraminal stenosis at L4-5. 3. Mild spinal stenosis at L2-3. 4. Aortic Atherosclerosis (ICD10-I70.0).   TODAY'S TREATMENT:                                                                                                                              DATE:   8/21  Nustep L5 8 min  LTR on swissball 2x20 Knee bent bridge on ball 3x10 Swissball HS curl with dig hold 3s 2x10 Fwd flexion with ball L and R SB 2x10 Supine piriformis stretch 30s 3x  Hip hinge to table height 3x10 Standing open book at wall 10x  Rolling ball lat stretch on table 3s 2x10  Standing hip flexor stretch 30s 3x each   8/15  Nustep L5 8 min   LTR on swissball 2x20 Bridge 3x10 Swissball HS curl with dig hold 3s 2x10 Fwd flexion with ball L and R SB 2x10 Supine piriformis stretch 30s 3x  Standing SB 5lbs 2x10 Standing hip flexor stretch 30s 3x each    8/8  (In sidelying)  STM R L2-5 lumbar paraspinals and QL; STM R gluteals, deep hip rotators L2-5 UPA grade III in S/L   LTR 2x10 Half bridge 2x10 Fwd flexion with ball and L SB 5s 10x Seated figure 4 30s 3x  Nustep L5 8 min   8/1   STM R L2-5 lumbar paraspinals and QL; STM R gluteals, deep hip rotators L2-5 UPA grade III in S/L   Wound management- edu  provided; see assessment for further details   7/1  STM R L2-5 lumbar paraspinals and QL; STM R gluteals, deep hip rotators L2-5 UPA grade III in S/L (unable to do prone)  Supine piriformis stretching 30s 3x each STS 5x from raised height table    5/28   STM bilat lumbar paraspinals and QL   Exercises - Supine Lower Trunk Rotation  - 2 x daily - 7 x weekly - 2 sets - 10 reps - 2 hold - Supine Posterior Pelvic Tilt  - 2 x daily - 7 x weekly - 2 sets - 10 reps - 2 hold - Supine Piriformis Stretch with Foot on Ground  - 2 x daily - 7 x weekly - 1 sets - 3 reps - 30 hold - Seated Quadratus Lumborum Stretch in Chair  - 2 x daily - 7 x weekly - 1 sets - 3 reps - 30 hold    PATIENT EDUCATION:  Education details: anatomy, exercise progression, DOMS expectations, muscle firing,  envelope of function, HEP, POC  Person educated: Patient Education method: Explanation, Demonstration, Tactile cues, Verbal cues, and Handouts Education comprehension: verbalized understanding, returned demonstration, verbal cues required, tactile cues required, and needs further education  HOME EXERCISE PROGRAM:  Access Code: [facility] URL: [email or web address] Date: 04/10/2023 Prepared by: Zebedee Iba   ASSESSMENT:  CLINICAL IMPRESSION:  Pt presents to PT following injection from MD. Pt able to progress mobility exercise today with less stiffness and pain into L lumbar region as compared to previous session. Pt able to start more hip hinging motions in addition to progressive hip strengthening. No pain noted during session. Pt to return from out of town in 4 wks. Continue with progressive lumbar mobility and strengthening as tolerated. Pt advised to start walking program while at the beach in order to improve upright and community ambulation tolerance. Pt would benefit from continued skilled therapy in order to reach goals and maximize functional T/S and rib function for return to PLOF.     OBJECTIVE IMPAIRMENTS: decreased activity tolerance, decreased endurance, decreased mobility, decreased ROM, decreased strength, hypomobility, increased muscle spasms, impaired flexibility, improper body mechanics, postural dysfunction, and pain.   ACTIVITY LIMITATIONS: carrying, lifting, sleeping, transfers, dressing, and reach over head  PARTICIPATION LIMITATIONS: cleaning, laundry, community activity, and exercise  PERSONAL FACTORS: Age, Fitness, Time since onset of injury/illness/exacerbation, and 3+ comorbidities:    are also affecting patient's functional outcome.   REHAB POTENTIAL: Fair  CLINICAL DECISION MAKING: Stable/uncomplicated  EVALUATION COMPLEXITY: Low   SHORT TERM GOALS: Target date: 05/22/2023   Pt will become independent with HEP in order to demonstrate synthesis of PT education.   Goal status: met  2.  Pt will report at least 2 pt reduction on NPRS scale for pain in order to demonstrate functional improvement with household activity, self care, and ADL.   Goal status: met  3.  Pt will demonstrate at least a 12.8 improvement in Oswestry Index in order to demonstrate a clinically significant change in LBP and function. .    Goal status: INITIAL  LONG TERM GOALS: Target date: 07/03/2023    Pt  will become independent with final HEP in order to demonstrate synthesis of PT education.  Baseline:  Goal status: ongoing  2. Pt will be able to demonstrate DL  squat to chair height without pain demonstrate functional improvement in bilat LE strength for return to PLOF and exercise.  Baseline:  Goal status: met  3.  Pt will be able to perform 5XSTS in under 12s  in order to demonstrate functional improvement above the cut off score for adults.  Goal status: ongoing  4.  Pt will be able to demonstrate/report ability to walk >15 mins without pain in order to demonstrate functional improvement and tolerance to exercise and community mobility.   Goal status:  ongoing PLAN:  PT FREQUENCY: 1x/week  PT DURATION: 12 weeks (likely to D/C by 8 with independent management)  PLANNED INTERVENTIONS: Therapeutic exercises, Therapeutic activity, Neuromuscular re-education, Balance training, Gait training, Patient/Family education, Self Care, Joint mobilization, Joint manipulation, Orthotic/Fit training, DME instructions, Aquatic Therapy, Dry Needling, Electrical stimulation, Spinal manipulation, Spinal mobilization, Cryotherapy, Moist heat, scar mobilization, Splintting, Taping, Vasopneumatic device, Traction, Ultrasound, Ionotophoresis 4mg /ml Dexamethasone, Manual therapy, and Re-evaluation.  PLAN FOR NEXT SESSION: stretching program, consider yoga, manual PRN,    Zebedee Iba, PT 07/04/2023, 2:14 PM

## 2023-07-11 NOTE — Progress Notes (Signed)
Remote pacemaker transmission.   

## 2023-07-20 ENCOUNTER — Encounter: Payer: Self-pay | Admitting: Internal Medicine

## 2023-08-08 ENCOUNTER — Other Ambulatory Visit (HOSPITAL_BASED_OUTPATIENT_CLINIC_OR_DEPARTMENT_OTHER): Payer: Self-pay

## 2023-08-08 MED ORDER — INFLUENZA VAC A&B SURF ANT ADJ 0.5 ML IM SUSY
0.5000 mL | PREFILLED_SYRINGE | Freq: Once | INTRAMUSCULAR | 0 refills | Status: AC
  Filled 2023-08-08: qty 0.5, 1d supply, fill #0

## 2023-08-10 DIAGNOSIS — N35011 Post-traumatic bulbous urethral stricture: Secondary | ICD-10-CM | POA: Diagnosis not present

## 2023-08-10 DIAGNOSIS — R351 Nocturia: Secondary | ICD-10-CM | POA: Diagnosis not present

## 2023-08-10 DIAGNOSIS — R350 Frequency of micturition: Secondary | ICD-10-CM | POA: Diagnosis not present

## 2023-08-10 DIAGNOSIS — R3912 Poor urinary stream: Secondary | ICD-10-CM | POA: Diagnosis not present

## 2023-08-10 DIAGNOSIS — R3915 Urgency of urination: Secondary | ICD-10-CM | POA: Diagnosis not present

## 2023-08-15 ENCOUNTER — Encounter: Payer: Self-pay | Admitting: Internal Medicine

## 2023-08-15 NOTE — Patient Instructions (Addendum)
      Blood work was ordered.   The lab is on the first floor.    Medications changes include :   none     Return in about 6 months (around 02/14/2024) for follow up.

## 2023-08-15 NOTE — Progress Notes (Unsigned)
Subjective:    Patient ID: [name], male    DOB: [birth year], 77 y.o.   MRN: [id number]     HPI [name] is here for follow up of his chronic medical problems.  CT chest with severe coronary artery calcifications-reviewed-discussed changing simvastatin to Crestor 40  Medications and allergies reviewed with patient and updated if appropriate.  Current Outpatient Medications on File Prior to Visit  Medication Sig Dispense Refill   albuterol (VENTOLIN HFA) 108 (90 Base) MCG/ACT inhaler INHALE 1-2 PUFFS BY MOUTH INTO THE LUNGS EVERY 6 HOURS AS NEEDED. 8.5 each 2   allopurinol (ZYLOPRIM) 300 MG tablet TAKE 1/2 TABLET BY MOUTH DAILY 45 tablet 1   apixaban (ELIQUIS) 5 MG TABS tablet Take 1 tablet (5 mg total) by mouth 2 (two) times daily. 180 tablet 2   benzonatate (TESSALON) 200 MG capsule TAKE 1 CAPSULE BY MOUTH THREE TIMES A DAY AS NEEDED 45 capsule 1   famotidine (PEPCID) 20 MG tablet One after supper 30 tablet 11   gabapentin (NEURONTIN) 300 MG capsule Take 1 capsule (300 mg total) by mouth 3 (three) times daily. (Patient taking differently: Take 300 mg by mouth 2 (two) times daily.) 90 capsule 2   pantoprazole (PROTONIX) 40 MG tablet TAKE 1 TABLET (40 MG TOTAL) BY MOUTH DAILY. TAKE 30-60 MIN BEFORE FIRST MEAL OF THE DAY 90 tablet 3   sildenafil (REVATIO) 20 MG tablet TAKE 3 TO 5 TABLETS BY MOUTH AS NEEDED 100 tablet 0   simvastatin (ZOCOR) 40 MG tablet Take 1 tablet (40 mg total) by mouth daily at 6 PM. 90 tablet 2   Tiotropium Bromide-Olodaterol (STIOLTO RESPIMAT) 2.5-2.5 MCG/ACT AERS Inhale 2 puffs into the lungs daily. 1 each 5   valsartan (DIOVAN) 80 MG tablet Take 1 tablet (80 mg total) by mouth daily. (Patient taking differently: Take 40 mg by mouth daily. Patient cuts 80 mg tablet in half  Takes 40 mg) 30 tablet 11   No current facility-administered medications on file prior to visit.     Review of Systems     Objective:  There were no vitals filed for this  visit. BP Readings from Last 3 Encounters:  07/03/23 122/80  06/28/23 114/70  03/27/23 (!) 158/90   Wt Readings from Last 3 Encounters:  07/03/23 235 lb (106.6 kg)  06/28/23 234 lb 12.8 oz (106.5 kg)  03/27/23 232 lb (105.2 kg)   There is no height or weight on file to calculate BMI.    Physical Exam     Lab Results  Component Value Date   WBC 6.1 02/13/2023   HGB 15.8 02/13/2023   HCT 46.9 02/13/2023   PLT 195.0 02/13/2023   GLUCOSE 106 (H) 03/14/2023   CHOL 185 02/13/2023   TRIG 93.0 02/13/2023   HDL 65.40 02/13/2023   LDLDIRECT 109.1 08/13/2008   LDLCALC 101 (H) 02/13/2023   ALT 42 03/14/2023   AST 42 (H) 03/14/2023   NA 137 03/14/2023   K 4.2 03/14/2023   CL 99 03/14/2023   CREATININE 1.17 03/14/2023   BUN 12 03/14/2023   CO2 25 03/14/2023   TSH 3.78 02/02/2021   PSA 2.77 03/14/2023   INR 1.02 01/13/2011   HGBA1C 5.4 02/13/2023     Assessment & Plan:    See Problem List for Assessment and Plan of chronic medical problems.

## 2023-08-16 ENCOUNTER — Ambulatory Visit: Payer: PPO | Admitting: Internal Medicine

## 2023-08-16 VITALS — BP 116/82 | HR 102 | Temp 98.1°F | Ht 77.0 in | Wt 235.0 lb

## 2023-08-16 DIAGNOSIS — R739 Hyperglycemia, unspecified: Secondary | ICD-10-CM

## 2023-08-16 DIAGNOSIS — N529 Male erectile dysfunction, unspecified: Secondary | ICD-10-CM | POA: Diagnosis not present

## 2023-08-16 DIAGNOSIS — M1A00X0 Idiopathic chronic gout, unspecified site, without tophus (tophi): Secondary | ICD-10-CM | POA: Diagnosis not present

## 2023-08-16 DIAGNOSIS — R519 Headache, unspecified: Secondary | ICD-10-CM | POA: Diagnosis not present

## 2023-08-16 DIAGNOSIS — I483 Typical atrial flutter: Secondary | ICD-10-CM

## 2023-08-16 DIAGNOSIS — G8929 Other chronic pain: Secondary | ICD-10-CM

## 2023-08-16 DIAGNOSIS — I10 Essential (primary) hypertension: Secondary | ICD-10-CM | POA: Diagnosis not present

## 2023-08-16 DIAGNOSIS — E7849 Other hyperlipidemia: Secondary | ICD-10-CM

## 2023-08-16 LAB — CBC WITH DIFFERENTIAL/PLATELET
Basophils Absolute: 0 10*3/uL (ref 0.0–0.1)
Basophils Relative: 0.6 % (ref 0.0–3.0)
Eosinophils Absolute: 0.1 10*3/uL (ref 0.0–0.7)
Eosinophils Relative: 2.4 % (ref 0.0–5.0)
HCT: 45.4 % (ref 39.0–52.0)
Hemoglobin: 15.1 g/dL (ref 13.0–17.0)
Lymphocytes Relative: 42.7 % (ref 12.0–46.0)
Lymphs Abs: 2.2 10*3/uL (ref 0.7–4.0)
MCHC: 33.3 g/dL (ref 30.0–36.0)
MCV: 108.1 fL — ABNORMAL HIGH (ref 78.0–100.0)
Monocytes Absolute: 0.4 10*3/uL (ref 0.1–1.0)
Monocytes Relative: 8.3 % (ref 3.0–12.0)
Neutro Abs: 2.4 10*3/uL (ref 1.4–7.7)
Neutrophils Relative %: 46 % (ref 43.0–77.0)
Platelets: 160 10*3/uL (ref 150.0–400.0)
RBC: 4.2 Mil/uL — ABNORMAL LOW (ref 4.22–5.81)
RDW: 14.4 % (ref 11.5–15.5)
WBC: 5.2 10*3/uL (ref 4.0–10.5)

## 2023-08-16 LAB — COMPREHENSIVE METABOLIC PANEL
ALT: 32 U/L (ref 0–53)
AST: 33 U/L (ref 0–37)
Albumin: 4.4 g/dL (ref 3.5–5.2)
Alkaline Phosphatase: 40 U/L (ref 39–117)
BUN: 14 mg/dL (ref 6–23)
CO2: 29 meq/L (ref 19–32)
Calcium: 10.3 mg/dL (ref 8.4–10.5)
Chloride: 100 meq/L (ref 96–112)
Creatinine, Ser: 1.22 mg/dL (ref 0.40–1.50)
GFR: 57.95 mL/min — ABNORMAL LOW (ref >60.00)
Glucose, Bld: 98 mg/dL (ref 70–99)
Potassium: 4.5 meq/L (ref 3.5–5.1)
Sodium: 138 meq/L (ref 135–145)
Total Bilirubin: 0.5 mg/dL (ref 0.2–1.2)
Total Protein: 7 g/dL (ref 6.0–8.3)

## 2023-08-16 LAB — LIPID PANEL
Cholesterol: 177 mg/dL (ref 0–200)
HDL: 74 mg/dL (ref >39.00)
LDL Cholesterol: 82 mg/dL (ref 0–99)
NonHDL: 102.83
Total CHOL/HDL Ratio: 2
Triglycerides: 103 mg/dL (ref 0.0–149.0)
VLDL: 20.6 mg/dL (ref 0.0–40.0)

## 2023-08-16 LAB — URIC ACID: Uric Acid, Serum: 5.7 mg/dL (ref 4.0–7.8)

## 2023-08-16 LAB — HEMOGLOBIN A1C: Hgb A1c MFr Bld: 5.4 % (ref 4.6–6.5)

## 2023-08-16 MED ORDER — SILDENAFIL CITRATE 20 MG PO TABS: ORAL_TABLET | ORAL | 3 refills | Status: DC

## 2023-08-16 MED ORDER — SIMVASTATIN 40 MG PO TABS: 40.0000 mg | ORAL_TABLET | Freq: Every day | ORAL | 2 refills | Status: DC

## 2023-08-16 MED ORDER — ALLOPURINOL 300 MG PO TABS: ORAL_TABLET | ORAL | 1 refills | Status: DC

## 2023-08-16 NOTE — Assessment & Plan Note (Signed)
Chronic Check a1c Low sugar / carb diet Stressed regular exercise  

## 2023-08-16 NOTE — Assessment & Plan Note (Addendum)
Chronic Regular exercise and healthy diet encouraged Discussed goal of LDL < 70-discussed possibly increasing simvastatin or adding Zetia-he would like to hold off for now and check blood work Check lipids Continue simvastatin 40 mg daily   Lab Results  Component Value Date   LDLCALC 101 (H) 02/13/2023

## 2023-08-16 NOTE — Assessment & Plan Note (Addendum)
Chronic Following with cardiology Seen on PPM On eliquis

## 2023-08-16 NOTE — Assessment & Plan Note (Addendum)
Chronic Taking gabapentin 300 mg bid Headaches controlled - occasionally takes tylenol Continue above

## 2023-08-16 NOTE — Assessment & Plan Note (Signed)
Chronic.  Continue sildenafil as needed.

## 2023-08-16 NOTE — Assessment & Plan Note (Signed)
Chronic Blood pressure controlled CMP, CBC Continue valsartan 40 mg daily

## 2023-08-16 NOTE — Assessment & Plan Note (Signed)
Chronic No issues with gout recently-controlled Continue allopurinol 150 mg daily Check uric acid level

## 2023-08-20 ENCOUNTER — Encounter (HOSPITAL_BASED_OUTPATIENT_CLINIC_OR_DEPARTMENT_OTHER): Payer: Self-pay | Admitting: Physical Therapy

## 2023-08-20 ENCOUNTER — Ambulatory Visit (HOSPITAL_BASED_OUTPATIENT_CLINIC_OR_DEPARTMENT_OTHER): Payer: PPO | Attending: Family Medicine | Admitting: Physical Therapy

## 2023-08-20 DIAGNOSIS — M545 Low back pain, unspecified: Secondary | ICD-10-CM | POA: Insufficient documentation

## 2023-08-20 DIAGNOSIS — R262 Difficulty in walking, not elsewhere classified: Secondary | ICD-10-CM | POA: Diagnosis not present

## 2023-08-20 DIAGNOSIS — M6281 Muscle weakness (generalized): Secondary | ICD-10-CM | POA: Insufficient documentation

## 2023-08-20 NOTE — Therapy (Signed)
OUTPATIENT PHYSICAL THERAPY THORACOLUMBAR TREATMENT   Patient Name: [name] MRN: [id number] DOB:1945/12/01, 77 y.o., male Today's Date: 08/20/2023  END OF SESSION:  PT End of Session - 08/20/23 0958     Visit Number 7    Number of Visits 16    Date for PT Re-Evaluation 11/18/23    Authorization Type HTA    PT Start Time 0930    PT Stop Time 1013    PT Time Calculation (min) 43 min    Activity Tolerance Patient tolerated treatment well    Behavior During Therapy WFL for tasks assessed/performed                Past Medical History:  Diagnosis Date   Complete heart block (HCC)    S/P  PPM by Dr [name]   Coronary artery disease    Non-obstructive. Pt had left heart catheterization in August 2009 showing a 30% mid-circumflex lesion, otherwise coronaries were clean angiographically   Hyperlipidemia    Hypertension    OSA (obstructive sleep apnea)    Probable; study never scheduled   Past Surgical History:  Procedure Laterality Date   CARDIAC CATHETERIZATION  1993, 2009   2009- negative   COLONOSCOPY  2011   negative, [facility]   PACEMAKER INSERTION  01/13/11   MDT by Fawn Kirk for complete heart block   Patient Active Problem List   Diagnosis Date Noted   Elevated PSA 03/14/2023   Elevated LFTs 03/14/2023   Atrial flutter (HCC) 02/13/2023   Chronic left SI joint pain 12/22/2022   DOE (dyspnea on exertion) 12/22/2022   Chest pain, atypical 10/10/2022   Upper airway cough syndrome 10/10/2022   Chronic cough 02/01/2022   Chronic right SI joint pain 05/11/2021   Umbilical hernia without obstruction or gangrene 02/02/2021   Hepatic steatosis 08/05/2020   Aortic atherosclerosis (HCC) 08/04/2020   Complex regional pain syndrome type I of the upper limb 07/26/2020   Flexion contracture of joint of hand 07/26/2020   Pacemaker 07/26/2020   Rib pain on left side 06/09/2020   Decreased pulses in feet 06/09/2020   Erectile dysfunction 03/03/2017   Chronic headaches  02/13/2017   Snoring 02/15/2015   Elevated liver enzymes 09/13/2014   Dupuytren's contracture of both hands 09/02/2013   Stiffness of right hand joint 05/07/2013   Gout 12/27/2011   Essential hypertension 01/12/2011   Complete heart block (HCC) - PPM 01/12/2011   Hyperglycemia 03/02/2009   HYPERPLASIA PROSTATE UNS W/O UR OBST & OTH LUTS 08/13/2008   Hyperlipidemia 02/17/2008   PAROXYSMAL SUPRAVENTRICULAR TACHYCARDIA 02/17/2008    PCP:    Pincus Sanes, MD    REFERRING PROVIDER: Rodolph Bong, MD   REFERRING DIAG:  Diagnosis  R07.81 (ICD-10-CM) - Rib pain   L sided  Rationale for Evaluation and Treatment: Rehabilitation  THERAPY DIAG:  Pain, lumbar region - Plan: PT plan of care cert/re-cert  Difficulty walking - Plan: PT plan of care cert/re-cert  Muscle weakness (generalized) - Plan: PT plan of care cert/re-cert  ONSET DATE: Fall 2023  SUBJECTIVE:  SUBJECTIVE STATEMENT:  Pt states the injection is still working. He is still stiff. Walking longer distances is still uncomfortable and requires seated rest breaks. Has not required ice packs. Carrying heavier things like water jugs can be uncomfortable.    Eval:  Pt states he is back today for his history of LBP. He no longer needs to sit on ice but the back hurts badly. He had SIJ injection from Dr. Denyse Amass that has given partial relief but it is still uncomfortable after being still for a while. Walking longer distances still hurts. He will be leaving for the beach for a month. Pt has not returned to previous HEP due to recent pain. Pt would like to restart exercise with PT. Pt feels that the injections help to reduce his pain. Luckily, the pain has progressed but has been managed with medical intervention. Pt states that walking back and  forth from the parking lot twice will increase pain.   Pt is well known to evaluating PT for history of lumbar pain and radiculopathy.  PERTINENT HISTORY:  A-fib, on blood thinners  PAIN:  Are you having pain? no NPRS scale: 0/10; worst 7/10 Pain location: Mostly R Pain description: sharp Aggravating factors: coughing, deep breath Relieving factors: rest  PRECAUTIONS: ICD/Pacemaker  WEIGHT BEARING RESTRICTIONS: No  FALLS:  Has patient fallen in last 6 months? No  LIVING ENVIRONMENT: Lives with: lives with their spouse Lives in: House/apartment Stairs: Yes to enter Has following equipment at home: None  OCCUPATION: retired  PLOF: Independent with basic ADLs  PATIENT GOALS: reduce pain   OBJECTIVE:   PATIENT SURVEYS:  Modified Oswestry Low Back Pain Disability Questionnaire: 19 / 50 = 38.0 %    10/7  Modified Oswestry Low Back Pain Disability Questionnaire: 10 / 50 = 20.0 %  SCREENING FOR RED FLAGS: Bowel or bladder incontinence: No Spinal tumors: No Cauda equina syndrome: No Compression fracture: No   COGNITION: Overall cognitive status: Within functional limits for tasks assessed                          SENSATION: WFL   POSTURE: decreased lumbar lordosis and increased thoracic kyphosis   PALPATION: TTP of R>L lumbar paraspinals      AROM eval 10/7  Flexion 75% p! 75%  Extension 50%  50%  Right lateral flexion 25% 50%  Left lateral flexion 25% 50%  Right rotation 50% 50%  Left rotation 50% 50%   (Blank rows = not tested)   AROM Right 10/7 Left 10/7  Hip flexion 100 p! 100  Hip extension -5 -5  Hip abduction      Hip adduction      Hip internal rotation 25 25  Hip external rotation 30 p! 30   (Blank rows = not tested)      MMT   No pain Right 10/7 Left 10/7  Hip flexion 4/5 4+/5  Hip extension      Hip abduction 4/5 4+/5  Hip adduction 4+/5 4+/5  Hip internal rotation      Hip external rotation 4/5 4/5   (Blank rows = not  tested)    SPECIAL TESTS:          Functional Tests      Functional tests Sit to Stand 15.3s   10/7   13.6S      TODAY'S TREATMENT:  DATE:   10/7  Nustep L5 6 min  Seated clam shell GTB 3x10 STS with GTB at knees 3x8 4" box step up 2x10 SLS   8/21  Nustep L5 8 min  LTR on swissball 2x20 Knee bent bridge on ball 3x10 Swissball HS curl with dig hold 3s 2x10 Fwd flexion with ball L and R SB 2x10 Supine piriformis stretch 30s 3x  Hip hinge to table height 3x10 Standing open book at wall 10x  Rolling ball lat stretch on table 3s 2x10  Standing hip flexor stretch 30s 3x each   8/15  Nustep L5 8 min   LTR on swissball 2x20 Bridge 3x10 Swissball HS curl with dig hold 3s 2x10 Fwd flexion with ball L and R SB 2x10 Supine piriformis stretch 30s 3x  Standing SB 5lbs 2x10 Standing hip flexor stretch 30s 3x each    8/8  (In sidelying)  STM R L2-5 lumbar paraspinals and QL; STM R gluteals, deep hip rotators L2-5 UPA grade III in S/L   LTR 2x10 Half bridge 2x10 Fwd flexion with ball and L SB 5s 10x Seated figure 4 30s 3x Nustep L5 8 min   8/1   STM R L2-5 lumbar paraspinals and QL; STM R gluteals, deep hip rotators L2-5 UPA grade III in S/L   Wound management- edu provided; see assessment for further details   7/1  STM R L2-5 lumbar paraspinals and QL; STM R gluteals, deep hip rotators L2-5 UPA grade III in S/L (unable to do prone)  Supine piriformis stretching 30s 3x each STS 5x from raised height table    5/28   STM bilat lumbar paraspinals and QL   Exercises - Supine Lower Trunk Rotation  - 2 x daily - 7 x weekly - 2 sets - 10 reps - 2 hold - Supine Posterior Pelvic Tilt  - 2 x daily - 7 x weekly - 2 sets - 10 reps - 2 hold - Supine Piriformis Stretch with Foot on Ground  - 2 x daily - 7 x weekly - 1 sets - 3  reps - 30 hold - Seated Quadratus Lumborum Stretch in Chair  - 2 x daily - 7 x weekly - 1 sets - 3 reps - 30 hold    PATIENT EDUCATION:  Education details: anatomy, exercise progression, DOMS expectations, muscle firing,  envelope of function, HEP, POC  Person educated: Patient Education method: Explanation, Demonstration, Tactile cues, Verbal cues, and Handouts Education comprehension: verbalized understanding, returned demonstration, verbal cues required, tactile cues required, and needs further education  HOME EXERCISE PROGRAM:  Access Code: [facility] URL: [email or web address] Date: 04/10/2023 Prepared by: Zebedee Iba   ASSESSMENT:  CLINICAL IMPRESSION:  Pt returns to clinic following extended period out of town. Pt has improved with ROM at the lumbar spine and LE strength but does continue to have limited endurance with walking tolerance as demonstrated by modified ODI outcome measure. Pt HEP progressed to include more SL stability and lumbopelvic strengthening that is unilateral in nature at MMT still demonstrates generalized R LE weakness, especially at hip and knee. Plan to continue with progression of standing tolerance and single leg standing strength. Pt would benefit from continued skilled therapy in order to reach goals and maximize functional T/S and rib function for return to PLOF.    OBJECTIVE IMPAIRMENTS: decreased activity tolerance, decreased endurance, decreased mobility, decreased ROM, decreased strength, hypomobility, increased muscle spasms, impaired flexibility, improper body mechanics, postural dysfunction, and pain.   ACTIVITY LIMITATIONS: carrying,  lifting, sleeping, transfers, dressing, and reach over head  PARTICIPATION LIMITATIONS: cleaning, laundry, community activity, and exercise  PERSONAL FACTORS: Age, Fitness, Time since onset of injury/illness/exacerbation, and 3+ comorbidities:    are also affecting patient's functional outcome.    REHAB POTENTIAL: Fair  CLINICAL DECISION MAKING: Stable/uncomplicated  EVALUATION COMPLEXITY: Low   SHORT TERM GOALS: Target date: 05/22/2023   Pt will become independent with HEP in order to demonstrate synthesis of PT education.   Goal status: met  2.  Pt will report at least 2 pt reduction on NPRS scale for pain in order to demonstrate functional improvement with household activity, self care, and ADL.   Goal status: met  3.  Pt will demonstrate at least a 12.8 improvement in Oswestry Index in order to demonstrate a clinically significant change in LBP and function. .    Goal status: MET  LONG TERM GOALS: Target date: 11/17/2022    Pt  will become independent with final HEP in order to demonstrate synthesis of PT education.  Baseline:  Goal status: ongoing  2. Pt will be able to demonstrate DL squat to chair height without pain demonstrate functional improvement in bilat LE strength for return to PLOF and exercise.  Baseline:  Goal status: met  3.  Pt will be able to perform 5XSTS in under 12s  in order to demonstrate functional improvement above the cut off score for adults.  Goal status: ongoing  4.  Pt will be able to demonstrate/report ability to walk >15 mins without pain in order to demonstrate functional improvement and tolerance to exercise and community mobility.   Goal status: ongoing PLAN:  PT FREQUENCY: 1x/week  PT DURATION: 12 weeks (likely to D/C by 8 with independent management)  PLANNED INTERVENTIONS: Therapeutic exercises, Therapeutic activity, Neuromuscular re-education, Balance training, Gait training, Patient/Family education, Self Care, Joint mobilization, Joint manipulation, Orthotic/Fit training, DME instructions, Aquatic Therapy, Dry Needling, Electrical stimulation, Spinal manipulation, Spinal mobilization, Cryotherapy, Moist heat, scar mobilization, Splintting, Taping, Vasopneumatic device, Traction, Ultrasound, Ionotophoresis 4mg /ml  Dexamethasone, Manual therapy, and Re-evaluation.  PLAN FOR NEXT SESSION: stretching program, consider yoga, manual PRN, R LE strength as tolerated   Zebedee Iba, PT 08/20/2023, 10:19 AM

## 2023-08-27 ENCOUNTER — Ambulatory Visit (HOSPITAL_BASED_OUTPATIENT_CLINIC_OR_DEPARTMENT_OTHER): Payer: PPO | Admitting: Physical Therapy

## 2023-08-29 NOTE — Progress Notes (Unsigned)
Subjective:    Patient ID: [name], male    DOB: 07-Nov-1946, 77 y.o.   MRN: [id number]      HPI Tillman is here for No chief complaint on file.    Knee abrasions -    Medications and allergies reviewed with patient and updated if appropriate.  Current Outpatient Medications on File Prior to Visit  Medication Sig Dispense Refill   albuterol (VENTOLIN HFA) 108 (90 Base) MCG/ACT inhaler INHALE 1-2 PUFFS BY MOUTH INTO THE LUNGS EVERY 6 HOURS AS NEEDED. 8.5 each 2   allopurinol (ZYLOPRIM) 300 MG tablet TAKE 1/2 TABLET BY MOUTH DAILY 45 tablet 1   apixaban (ELIQUIS) 5 MG TABS tablet Take 1 tablet (5 mg total) by mouth 2 (two) times daily. 180 tablet 2   benzonatate (TESSALON) 200 MG capsule TAKE 1 CAPSULE BY MOUTH THREE TIMES A DAY AS NEEDED 45 capsule 1   famotidine (PEPCID) 20 MG tablet One after supper 30 tablet 11   gabapentin (NEURONTIN) 300 MG capsule Take 1 capsule (300 mg total) by mouth 3 (three) times daily. (Patient taking differently: Take 300 mg by mouth 2 (two) times daily.) 90 capsule 2   pantoprazole (PROTONIX) 40 MG tablet TAKE 1 TABLET (40 MG TOTAL) BY MOUTH DAILY. TAKE 30-60 MIN BEFORE FIRST MEAL OF THE DAY 90 tablet 3   sildenafil (REVATIO) 20 MG tablet TAKE 3 TO 5 TABLETS BY MOUTH AS NEEDED 100 tablet 3   simvastatin (ZOCOR) 40 MG tablet Take 1 tablet (40 mg total) by mouth daily at 6 PM. 90 tablet 2   Tiotropium Bromide-Olodaterol (STIOLTO RESPIMAT) 2.5-2.5 MCG/ACT AERS Inhale 2 puffs into the lungs daily. 1 each 5   valsartan (DIOVAN) 80 MG tablet Take 1 tablet (80 mg total) by mouth daily. (Patient taking differently: Take 40 mg by mouth daily. Patient cuts 80 mg tablet in half  Takes 40 mg) 30 tablet 11   No current facility-administered medications on file prior to visit.    Review of Systems     Objective:  There were no vitals filed for this visit. BP Readings from Last 3 Encounters:  08/16/23 116/82  07/03/23 122/80  06/28/23 114/70   Wt  Readings from Last 3 Encounters:  08/16/23 235 lb (106.6 kg)  07/03/23 235 lb (106.6 kg)  06/28/23 234 lb 12.8 oz (106.5 kg)   There is no height or weight on file to calculate BMI.    Physical Exam         Assessment & Plan:    See Problem List for Assessment and Plan of chronic medical problems.

## 2023-08-30 ENCOUNTER — Ambulatory Visit (INDEPENDENT_AMBULATORY_CARE_PROVIDER_SITE_OTHER): Payer: PPO | Admitting: Internal Medicine

## 2023-08-30 ENCOUNTER — Encounter: Payer: Self-pay | Admitting: Internal Medicine

## 2023-08-30 ENCOUNTER — Other Ambulatory Visit (HOSPITAL_BASED_OUTPATIENT_CLINIC_OR_DEPARTMENT_OTHER): Payer: Self-pay

## 2023-08-30 VITALS — BP 116/74 | HR 80 | Temp 98.0°F | Ht 77.0 in

## 2023-08-30 DIAGNOSIS — L03116 Cellulitis of left lower limb: Secondary | ICD-10-CM

## 2023-08-30 DIAGNOSIS — T148XXA Other injury of unspecified body region, initial encounter: Secondary | ICD-10-CM | POA: Diagnosis not present

## 2023-08-30 MED ORDER — COVID-19 MRNA VAC-TRIS(PFIZER) 30 MCG/0.3ML IM SUSY
0.3000 mL | PREFILLED_SYRINGE | Freq: Once | INTRAMUSCULAR | 0 refills | Status: AC
  Filled 2023-08-30: qty 0.3, 1d supply, fill #0

## 2023-08-30 MED ORDER — CEPHALEXIN 500 MG PO CAPS: 500.0000 mg | ORAL_CAPSULE | Freq: Three times a day (TID) | ORAL | 0 refills | Status: AC

## 2023-08-30 NOTE — Assessment & Plan Note (Signed)
Bilateral knees Skin completely torn off Both slightly oozing Left knee with concern of beginning of cellulitis Tdap up-to-date Both knees bandaged with nonstick pad with triple antibiotic ointment on it and Coban He will continue local wound care Will start antibiotic for early cellulitis

## 2023-08-30 NOTE — Patient Instructions (Signed)
         Medications changes include :   antibiotic sent to pharmacy     Monitor for signs of infection - call with concerns.

## 2023-08-30 NOTE — Assessment & Plan Note (Signed)
Acute Secondary to knee abrasion/skin tear of left knee from fall Has surrounding erythema suggestive of cellulitis Td up-to-date Start cephalexin 500 mg 3 times daily x 10 days Monitor closely he will call or return if he has any concerns

## 2023-09-02 ENCOUNTER — Other Ambulatory Visit: Payer: Self-pay | Admitting: Internal Medicine

## 2023-09-03 ENCOUNTER — Ambulatory Visit (HOSPITAL_BASED_OUTPATIENT_CLINIC_OR_DEPARTMENT_OTHER): Payer: PPO | Admitting: Physical Therapy

## 2023-09-13 NOTE — Progress Notes (Signed)
I, Stevenson Clinch, CMA acting as a Neurosurgeon for Jonathan Graham, MD.  [name] is a 77 y.o. male who presents to [facility] today for f/u SI joint pain. Pt was last seen by Dr. Denyse Amass on 07/03/23 and was given bilat SI joint steroid injections and advised to cont HEP.  Today, pt reports worsening pain since the end of Sept. Has been using Salonpas. Sx tend to be worse in the mornings and improve through out the day. Notes rolling off the bed 3 weeks ago injuring both knees. Had eval with PCP. The knees are bandaged up today.   Dx imaging: 08/10/21 L-spine CT myelogram             05/11/21 R hip & L-spine XR  Pertinent review of systems: No fevers or chills  Relevant historical information: Heart disease   Exam:  BP 120/80   Pulse (!) 109   Ht 6\' 5"  (1.956 m)   Wt 233 lb (105.7 kg)   SpO2 97%   BMI 27.63 kg/m  General: Well Developed, well nourished, and in no acute distress.   MSK: L-spine nontender palpation midline tender palpation bilateral SI joints.  Decreased lumbar motion.  Knees bilaterally abrasions anterior knees present.    Lab and Radiology Results  Procedure: Real-time Ultrasound Guided Injection of right SI joint Device: Philips Affiniti 50G/GE Logiq Images permanently stored and available for review in PACS Verbal informed consent obtained.  Discussed risks and benefits of procedure. Warned about infection, bleeding, hyperglycemia damage to structures among others. Patient expresses understanding and agreement Time-out conducted.   Noted no overlying erythema, induration, or other signs of local infection.   Skin prepped in a sterile fashion.   Local anesthesia: Topical Ethyl chloride.   With sterile technique and under real time ultrasound guidance: 40 mg of Kenalog and 2 ml of Marcaine injected into SI joint. Fluid seen entering the joint capsule.   Completed without difficulty   Pain immediately resolved suggesting  accurate placement of the medication.   Advised to call if fevers/chills, erythema, induration, drainage, or persistent bleeding.   Images permanently stored and available for review in the ultrasound unit.  Impression: Technically successful ultrasound guided injection.    Procedure: Real-time Ultrasound Guided Injection of left SI joint Device: Philips Affiniti 50G/GE Logiq Images permanently stored and available for review in PACS Verbal informed consent obtained.  Discussed risks and benefits of procedure. Warned about infection, bleeding, hyperglycemia damage to structures among others. Patient expresses understanding and agreement Time-out conducted.   Noted no overlying erythema, induration, or other signs of local infection.   Skin prepped in a sterile fashion.   Local anesthesia: Topical Ethyl chloride.   With sterile technique and under real time ultrasound guidance: 40 mg of Kenalog and 2 mL of Marcaine injected into left SI joint. Fluid seen entering the joint capsule.   Completed without difficulty   Pain immediately resolved suggesting accurate placement of the medication.   Advised to call if fevers/chills, erythema, induration, drainage, or persistent bleeding.   Images permanently stored and available for review in the ultrasound unit.  Impression: Technically successful ultrasound guided injection.        Assessment and Plan: 77 y.o. male with chronic bilateral low back pain thought to be SI joint dysfunction.  Plan for repeat steroid injection today.  Dressings were changed anterior knees bilaterally by CMA.   PDMP not reviewed this encounter. Orders Placed This Encounter  Procedures   [place] LIMITED JOINT SPACE STRUCTURES LOW BILAT(NO LINKED CHARGES)    Order Specific Question:   Reason for Exam (SYMPTOM  OR DIAGNOSIS REQUIRED)    Answer:   BL SI inj    Order Specific Question:   Preferred imaging location?    Answer:   [facility]    No orders of the defined types were placed in this encounter.    Discussed warning signs or symptoms. Please see discharge instructions. Patient expresses understanding.   The above documentation has been reviewed and is accurate and complete [name], M.D.

## 2023-09-14 ENCOUNTER — Encounter: Payer: Self-pay | Admitting: Family Medicine

## 2023-09-14 ENCOUNTER — Ambulatory Visit: Payer: PPO | Admitting: Family Medicine

## 2023-09-14 ENCOUNTER — Other Ambulatory Visit: Payer: Self-pay

## 2023-09-14 VITALS — BP 120/80 | HR 109 | Ht 77.0 in | Wt 233.0 lb

## 2023-09-14 DIAGNOSIS — M533 Sacrococcygeal disorders, not elsewhere classified: Secondary | ICD-10-CM | POA: Diagnosis not present

## 2023-09-14 DIAGNOSIS — G8929 Other chronic pain: Secondary | ICD-10-CM

## 2023-09-14 DIAGNOSIS — M545 Low back pain, unspecified: Secondary | ICD-10-CM

## 2023-09-14 NOTE — Patient Instructions (Signed)
Thank you for coming in today.   You received an injection today. Seek immediate medical attention if the joint becomes red, extremely painful, or is oozing fluid.   Check back as needed 

## 2023-09-20 ENCOUNTER — Other Ambulatory Visit: Payer: Self-pay | Admitting: Internal Medicine

## 2023-09-20 ENCOUNTER — Encounter: Payer: Self-pay | Admitting: Internal Medicine

## 2023-09-21 ENCOUNTER — Telehealth: Payer: Self-pay | Admitting: Internal Medicine

## 2023-09-21 NOTE — Telephone Encounter (Signed)
Patient would like a refill of gabapentin from Dr. Sherene Sires but after this refill he would like Dr.Burns to take over it.

## 2023-09-23 MED ORDER — GABAPENTIN 300 MG PO CAPS: 300.0000 mg | ORAL_CAPSULE | Freq: Three times a day (TID) | ORAL | 2 refills | Status: DC

## 2023-09-23 MED ORDER — VALSARTAN 40 MG PO TABS: 40.0000 mg | ORAL_TABLET | Freq: Every day | ORAL | 3 refills | Status: DC

## 2023-09-24 ENCOUNTER — Encounter: Payer: Self-pay | Admitting: Internal Medicine

## 2023-09-25 MED ORDER — AMLODIPINE BESYLATE 5 MG PO TABS: 5.0000 mg | ORAL_TABLET | Freq: Every day | ORAL | 2 refills | Status: DC

## 2023-09-26 ENCOUNTER — Ambulatory Visit: Payer: PPO

## 2023-09-26 DIAGNOSIS — I442 Atrioventricular block, complete: Secondary | ICD-10-CM

## 2023-09-27 LAB — CUP PACEART REMOTE DEVICE CHECK
Battery Impedance: 4029 Ohm
Battery Remaining Longevity: 16 mo
Battery Voltage: 2.69 V
Brady Statistic AP VP Percent: 3 %
Brady Statistic AP VS Percent: 0 %
Brady Statistic AS VP Percent: 97 %
Brady Statistic AS VS Percent: 0 %
Date Time Interrogation Session: 20241113110909
Implantable Lead Connection Status: 753985
Implantable Lead Connection Status: 753985
Implantable Lead Implant Date: 20120302
Implantable Lead Implant Date: 20120302
Implantable Lead Location: 753859
Implantable Lead Location: 753860
Implantable Lead Model: 5076
Implantable Pulse Generator Implant Date: 20120302
Lead Channel Impedance Value: 548 Ohm
Lead Channel Impedance Value: 634 Ohm
Lead Channel Pacing Threshold Amplitude: 0.625 V
Lead Channel Pacing Threshold Amplitude: 0.75 V
Lead Channel Pacing Threshold Pulse Width: 0.4 ms
Lead Channel Pacing Threshold Pulse Width: 0.4 ms
Lead Channel Setting Pacing Amplitude: 2 V
Lead Channel Setting Pacing Amplitude: 2 V
Lead Channel Setting Pacing Pulse Width: 0.4 ms
Lead Channel Setting Sensing Sensitivity: 2.8 mV
Zone Setting Status: 755011
Zone Setting Status: 755011

## 2023-09-28 ENCOUNTER — Other Ambulatory Visit: Payer: Self-pay

## 2023-09-28 MED ORDER — AMLODIPINE BESYLATE 5 MG PO TABS: 5.0000 mg | ORAL_TABLET | Freq: Every day | ORAL | 2 refills | Status: DC

## 2023-09-28 NOTE — Telephone Encounter (Signed)
Medication was refilled by Dr. Lawerance Bach 11/10.

## 2023-10-03 ENCOUNTER — Ambulatory Visit: Payer: PPO | Admitting: Family Medicine

## 2023-10-05 ENCOUNTER — Other Ambulatory Visit: Payer: Self-pay | Admitting: Cardiovascular Disease

## 2023-10-05 DIAGNOSIS — I483 Typical atrial flutter: Secondary | ICD-10-CM

## 2023-10-05 NOTE — Telephone Encounter (Signed)
Eliquis 5mg  refill request received. Patient is 77 years old, weight-105.7kg, Crea-1.22 on 08/16/23, Diagnosis-Aflutter, and last seen by Dr. Nelly Laurence on 01/18/23. Dose is appropriate based on dosing criteria. Will send in refill to requested pharmacy.

## 2023-10-14 ENCOUNTER — Encounter: Payer: Self-pay | Admitting: Cardiovascular Disease

## 2023-10-16 NOTE — Telephone Encounter (Signed)
Spoke with patient, he's now aware he wouldn't be a candidate for the watchman simply for wanting to come off of his Eliquis. Patient states he can afford his Eliquis but spoke of how expensive it was and considered wanting to switch back to warfarin. Doesn't want to pursue medication changes just yet. Patient is due for 1 year fu 3/25 but would like to schedule sooner to discuss his anticoagulation options with Dr Nelly Laurence. Appointment scheduled for 1/25. No further needs

## 2023-10-17 DIAGNOSIS — Z85828 Personal history of other malignant neoplasm of skin: Secondary | ICD-10-CM | POA: Diagnosis not present

## 2023-10-17 DIAGNOSIS — L570 Actinic keratosis: Secondary | ICD-10-CM | POA: Diagnosis not present

## 2023-10-17 DIAGNOSIS — D225 Melanocytic nevi of trunk: Secondary | ICD-10-CM | POA: Diagnosis not present

## 2023-10-17 DIAGNOSIS — L821 Other seborrheic keratosis: Secondary | ICD-10-CM | POA: Diagnosis not present

## 2023-10-17 DIAGNOSIS — L814 Other melanin hyperpigmentation: Secondary | ICD-10-CM | POA: Diagnosis not present

## 2023-10-17 DIAGNOSIS — C44629 Squamous cell carcinoma of skin of left upper limb, including shoulder: Secondary | ICD-10-CM | POA: Diagnosis not present

## 2023-10-17 DIAGNOSIS — D485 Neoplasm of uncertain behavior of skin: Secondary | ICD-10-CM | POA: Diagnosis not present

## 2023-10-17 DIAGNOSIS — L578 Other skin changes due to chronic exposure to nonionizing radiation: Secondary | ICD-10-CM | POA: Diagnosis not present

## 2023-10-18 NOTE — Progress Notes (Signed)
Remote pacemaker transmission.   

## 2023-11-15 ENCOUNTER — Other Ambulatory Visit: Payer: Self-pay | Admitting: Adult Health

## 2023-11-28 ENCOUNTER — Other Ambulatory Visit: Payer: Self-pay | Admitting: Internal Medicine

## 2023-11-29 ENCOUNTER — Ambulatory Visit: Payer: PPO | Attending: Cardiovascular Disease | Admitting: Cardiovascular Disease

## 2023-11-29 ENCOUNTER — Telehealth: Payer: Self-pay

## 2023-11-29 ENCOUNTER — Encounter: Payer: Self-pay | Admitting: Cardiovascular Disease

## 2023-11-29 VITALS — BP 122/76 | HR 96 | Ht 77.0 in | Wt 235.0 lb

## 2023-11-29 DIAGNOSIS — I471 Supraventricular tachycardia, unspecified: Secondary | ICD-10-CM

## 2023-11-29 DIAGNOSIS — I442 Atrioventricular block, complete: Secondary | ICD-10-CM

## 2023-11-29 LAB — CUP PACEART INCLINIC DEVICE CHECK
Battery Impedance: 4212 Ohm
Battery Remaining Longevity: 14 mo
Battery Voltage: 2.67 V
Brady Statistic AP VP Percent: 3 %
Brady Statistic AP VS Percent: 0 %
Brady Statistic AS VP Percent: 97 %
Brady Statistic AS VS Percent: 0 %
Date Time Interrogation Session: 20250116142703
Implantable Lead Connection Status: 753985
Implantable Lead Connection Status: 753985
Implantable Lead Implant Date: 20120302
Implantable Lead Implant Date: 20120302
Implantable Lead Location: 753859
Implantable Lead Location: 753860
Implantable Lead Model: 5076
Implantable Pulse Generator Implant Date: 20120302
Lead Channel Impedance Value: 514 Ohm
Lead Channel Impedance Value: 573 Ohm
Lead Channel Pacing Threshold Amplitude: 0.5 V
Lead Channel Pacing Threshold Amplitude: 0.75 V
Lead Channel Pacing Threshold Pulse Width: 0.4 ms
Lead Channel Pacing Threshold Pulse Width: 0.4 ms
Lead Channel Sensing Intrinsic Amplitude: 4 mV
Lead Channel Setting Pacing Amplitude: 2 V
Lead Channel Setting Pacing Amplitude: 2 V
Lead Channel Setting Pacing Pulse Width: 0.46 ms
Lead Channel Setting Sensing Sensitivity: 2.8 mV
Zone Setting Status: 755011
Zone Setting Status: 755011

## 2023-11-29 NOTE — Telephone Encounter (Addendum)
Scheduled the patient for Watchman consult with Dr. Jimmey Ralph on 01/16/2024. He was grateful for call and agreed with plan.

## 2023-11-29 NOTE — Patient Instructions (Signed)
Medication Instructions:  Your physician recommends that you continue on your current medications as directed. Please refer to the Current Medication list given to you today. *If you need a refill on your cardiac medications before your next appointment, please call your pharmacy*   Follow-Up: At [facility], you and your health needs are our priority.  As part of our continuing mission to provide you with exceptional heart care, we have created designated Provider Care Teams.  These Care Teams include your primary Cardiologist (physician) and Advanced Practice Providers (APPs -  Physician Assistants and Nurse Practitioners) who all work together to provide you with the care you need, when you need it.  We recommend signing up for the patient portal called "MyChart".  Sign up information is provided on this After Visit Summary.  MyChart is used to connect with patients for Virtual Visits (Telemedicine).  Patients are able to view lab/test results, encounter notes, upcoming appointments, etc.  Non-urgent messages can be sent to your provider as well.   To learn more about what you can do with MyChart, go to [email or web address].    Your next appointment:   Set up an appointment with Dr Jimmey Ralph to discuss Watchman   Provider:   Nobie Putnam, MD

## 2023-11-29 NOTE — Progress Notes (Signed)
     PCP: Pincus Sanes, MD   Primary EP:  Dr Rayann Heman is a 78 y.o. male who presents today for routine electrophysiology followup.  Since last being seen in our clinic, the patient reports doing very well.  Today, he denies symptoms of palpitations, chest pain, shortness of breath,  lower extremity edema, dizziness, presyncope, or syncope.  The patient is otherwise without complaint today.   He has had sustained atrial high rate episodes detected. The atrial rate was about 250 bpm when EGM's are present. Often, only marker channel data is available.V rates are controlled, pt is V-paced.    Today, he expresses concern about easy bleeding.  He has had multiple episodes of severe bleeding and is once had to call a cleaning company to assist with clean up after bleeding episode.  His skin is very thin and tears easily.  he has no device related complaints -- no new tenderness, drainage, redness.     Physical Exam: Vitals:   11/29/23 1414  BP: 122/76  Pulse: 96  SpO2: 95%  Weight: 235 lb (106.6 kg)  Height: 6\' 5"  (1.956 m)      GEN- The patient is well appearing, alert and oriented x 3 today.   Head- normocephalic, atraumatic Lungs- normal work of breathing Chest- pacemaker pocket is well healed Heart- Regular rate and rhythm, no murmurs, rubs or gallops, PMI not laterally displaced GI- soft, NT, ND, + BS Extremities- no clubbing, cyanosis, or edema  Pacemaker interrogation- reviewed in detail today,  See PACEART report  ekg tracing ordered today is personally reviewed and shows AV paced  Assessment and Plan:  1. Symptomatic complete heart block Normal pacemaker function See Arita Miss Art report he is not device dependant today  2. Atrial high rate episodes --  Noted on PPM -- completely asymptomatic, V-rate controlled. The few actual EGMs recorded appear most consistent with flutter. Atrial rates approximately 250 bpm.  Will continue to monitor through  device. No changes  3. Secondary hypercoagulable state He has been having profuse bleeding I think it would be reasonable to consider LAA occlusion.    Maurice Small, MD  11/29/2023 3:25 PM

## 2023-12-05 DIAGNOSIS — D0461 Carcinoma in situ of skin of right upper limb, including shoulder: Secondary | ICD-10-CM | POA: Diagnosis not present

## 2023-12-05 DIAGNOSIS — C44629 Squamous cell carcinoma of skin of left upper limb, including shoulder: Secondary | ICD-10-CM | POA: Diagnosis not present

## 2023-12-05 DIAGNOSIS — L570 Actinic keratosis: Secondary | ICD-10-CM | POA: Diagnosis not present

## 2023-12-05 DIAGNOSIS — D485 Neoplasm of uncertain behavior of skin: Secondary | ICD-10-CM | POA: Diagnosis not present

## 2023-12-06 ENCOUNTER — Ambulatory Visit: Payer: PPO | Admitting: Family Medicine

## 2023-12-06 ENCOUNTER — Other Ambulatory Visit: Payer: Self-pay

## 2023-12-06 VITALS — BP 152/98 | HR 97 | Ht 77.0 in | Wt 236.0 lb

## 2023-12-06 DIAGNOSIS — G8929 Other chronic pain: Secondary | ICD-10-CM | POA: Diagnosis not present

## 2023-12-06 DIAGNOSIS — M533 Sacrococcygeal disorders, not elsewhere classified: Secondary | ICD-10-CM | POA: Diagnosis not present

## 2023-12-06 NOTE — Patient Instructions (Signed)
Thank you for coming in today.   You received an injection today. Seek immediate medical attention if the joint becomes red, extremely painful, or is oozing fluid.  

## 2023-12-06 NOTE — Progress Notes (Signed)
Rubin Payor, PhD, LAT, ATC acting as a scribe for Clementeen Graham, MD.  [name] is a 78 y.o. male who presents to [facility] today for f/u SI joint pain. Pt was last seen by Dr. Denyse Amass on 09/14/23 and was given bilat SI joint steroid injections.  Today, pt reports R-side of his low back seems to be worse than L. Pain is worse in the mornings. He states that he thinks the pain is starting to return sooner.  He is unable to walk long distances.  Dx imaging: 08/10/21 L-spine CT myelogram             05/11/21 R hip & L-spine XR   Pertinent review of systems: No fevers or chills  Relevant historical information: Atrial fibrillation on blood thinner   Exam:  BP (!) 152/98   Pulse 97   Ht 6\' 5"  (1.956 m)   Wt 236 lb (107 kg)   SpO2 98%   BMI 27.99 kg/m  General: Well Developed, well nourished, and in no acute distress.   MSK: L-spine nontender palpation midline tender palpation bilateral SI joints.    Lab and Radiology Results  Procedure: Real-time Ultrasound Guided Injection of right SI joint Device: Philips Affiniti 50G/GE Logiq Images permanently stored and available for review in PACS Verbal informed consent obtained.  Discussed risks and benefits of procedure. Warned about infection, bleeding, hyperglycemia damage to structures among others. Patient expresses understanding and agreement Time-out conducted.   Noted no overlying erythema, induration, or other signs of local infection.   Skin prepped in a sterile fashion.   Local anesthesia: Topical Ethyl chloride.   With sterile technique and under real time ultrasound guidance: 40 mg of Kenalog and 2 mL of Marcaine injected into SI joint. Fluid seen entering the joint cleft.   Completed without difficulty   Pain immediately resolved suggesting accurate placement of the medication.   Advised to call if fevers/chills, erythema, induration, drainage, or persistent bleeding.   Images  permanently stored and available for review in the ultrasound unit.  Impression: Technically successful ultrasound guided injection.    Procedure: Real-time Ultrasound Guided Injection of left SI joint Device: Philips Affiniti 50G/GE Logiq Images permanently stored and available for review in PACS Verbal informed consent obtained.  Discussed risks and benefits of procedure. Warned about infection, bleeding, hyperglycemia damage to structures among others. Patient expresses understanding and agreement Time-out conducted.   Noted no overlying erythema, induration, or other signs of local infection.   Skin prepped in a sterile fashion.   Local anesthesia: Topical Ethyl chloride.   With sterile technique and under real time ultrasound guidance: 40 mg of Kenalog and 2 mL of Marcaine injected into SI joint. Fluid seen entering the joint cleft.   Completed without difficulty   Pain immediately resolved suggesting accurate placement of the medication.   Advised to call if fevers/chills, erythema, induration, drainage, or persistent bleeding.   Images permanently stored and available for review in the ultrasound unit.  Impression: Technically successful ultrasound guided injection.  Of note injections were performed with patient in the hip flexed leaning over the table position successfully.  He found this to be more comfortable than a completely prone position.       Assessment and Plan: 78 y.o. male with chronic low back pain due to SI joint dysfunction.  This is an acute exacerbation of a chronic problem.  He has been getting repeat SI injections typically about every  3 months.  He notes that the injections are not lasting as long as they normally would.  Next step would be referred to pain management for SI joint nerve block and ablation if needed.  He notes he has other medical priorities right now would like to continue with steroid injections for now which I think is okay.  Check back as  needed typically in 3 months for injection if needed.   PDMP not reviewed this encounter. Orders Placed This Encounter  Procedures   [place] LIMITED JOINT SPACE STRUCTURES LOW BILAT(NO LINKED CHARGES)    Reason for Exam (SYMPTOM  OR DIAGNOSIS REQUIRED):   bilateral SI joint pain    Preferred imaging location?:   [facility]   No orders of the defined types were placed in this encounter.    Discussed warning signs or symptoms. Please see discharge instructions. Patient expresses understanding.   The above documentation has been reviewed and is accurate and complete Clementeen Graham, M.D.

## 2023-12-12 ENCOUNTER — Other Ambulatory Visit: Payer: Self-pay | Admitting: Adult Health

## 2023-12-12 DIAGNOSIS — R911 Solitary pulmonary nodule: Secondary | ICD-10-CM

## 2023-12-13 ENCOUNTER — Telehealth: Payer: Self-pay | Admitting: Adult Health

## 2023-12-14 NOTE — Telephone Encounter (Signed)
 okay

## 2023-12-14 NOTE — Telephone Encounter (Signed)
When was CT to be performed patient declined when [facility] called to schedule? He said "he doesn't need CT"    Assessment & Plan Note by Julio Sicks, NP at 06/28/2023 3:31 PM  Author: Julio Sicks, NP Author Type: Nurse Practitioner Filed: 06/28/2023  3:31 PM  Note Status: Written Cosign: Cosign Not Required Encounter Date: 06/28/2023  Problem: Upper airway cough syndrome  Editor: Julio Sicks, NP (Nurse Practitioner)             Chronic cough -no perceived benefit on Stiolto. HRCT chest showed very subtle  subpleural reticulation.  PFT showed very minimal restriction and normal diffusing capacity.  We discussed repeating PFTs with a spirometry and DLCO on return visit  if he has a clinical change or drop in his lung capacity on return visit can repeat his high-res CT chest.  Continue on cough control regimen and trigger prevention.   Plan  Patient Instructions  Albuterol inhaler As needed   Delsym 2 tsp Twice daily  for cough As needed  (OTC ) Tessalon Three times a day  for cough as needed  Claritin 10mg  daily in am (OTC) As needed  drainage , drippy nose  Chlorpheniramine 4mg  At bedtime  (OTC) as needed drainage, drippy nose  Continue on Prilosec and Pepcid  Sips of water to soothe throat , no MINTS.  Follow up in 1 year with Dr. Sherene Sires or Parrett NP and As needed  (Spirometry with DLCO )  Please contact office for sooner follow up if symptoms do not improve or worsen or seek emergency care

## 2023-12-20 DIAGNOSIS — Z5189 Encounter for other specified aftercare: Secondary | ICD-10-CM | POA: Diagnosis not present

## 2023-12-20 DIAGNOSIS — L720 Epidermal cyst: Secondary | ICD-10-CM | POA: Diagnosis not present

## 2023-12-20 DIAGNOSIS — D485 Neoplasm of uncertain behavior of skin: Secondary | ICD-10-CM | POA: Diagnosis not present

## 2023-12-26 ENCOUNTER — Ambulatory Visit (INDEPENDENT_AMBULATORY_CARE_PROVIDER_SITE_OTHER): Payer: PPO

## 2023-12-26 DIAGNOSIS — I442 Atrioventricular block, complete: Secondary | ICD-10-CM

## 2023-12-26 LAB — CUP PACEART REMOTE DEVICE CHECK
Battery Impedance: 5012 Ohm
Battery Remaining Longevity: 9 mo
Battery Voltage: 2.65 V
Brady Statistic AP VP Percent: 2 %
Brady Statistic AP VS Percent: 0 %
Brady Statistic AS VP Percent: 98 %
Brady Statistic AS VS Percent: 0 %
Date Time Interrogation Session: 20250212083804
Implantable Lead Connection Status: 753985
Implantable Lead Connection Status: 753985
Implantable Lead Implant Date: 20120302
Implantable Lead Implant Date: 20120302
Implantable Lead Location: 753859
Implantable Lead Location: 753860
Implantable Lead Model: 5076
Implantable Pulse Generator Implant Date: 20120302
Lead Channel Impedance Value: 476 Ohm
Lead Channel Impedance Value: 630 Ohm
Lead Channel Pacing Threshold Amplitude: 0.5 V
Lead Channel Pacing Threshold Amplitude: 0.625 V
Lead Channel Pacing Threshold Pulse Width: 0.4 ms
Lead Channel Pacing Threshold Pulse Width: 0.4 ms
Lead Channel Setting Pacing Amplitude: 2 V
Lead Channel Setting Pacing Amplitude: 2 V
Lead Channel Setting Pacing Pulse Width: 0.4 ms
Lead Channel Setting Sensing Sensitivity: 2.8 mV
Zone Setting Status: 755011
Zone Setting Status: 755011

## 2023-12-31 ENCOUNTER — Telehealth: Payer: Self-pay

## 2023-12-31 NOTE — Telephone Encounter (Signed)
Alert received from [employer] for   Scheduled remote reviewed. Normal device function.   Estimated Battery Longevity: 9 months, < 1 -23 months; routed to clinic to initiate monthly battery checks.  16 AHR episodes, longest 11 min 8 sec, V rates > 100 bpm ~ 25%, Hx of AFL and on Eliquis per Epic.  1 VHR episode, 9 beats V>A conduction at 180 bpm.  Next remote 91 days.  MC, CV[facility]  Called and made pt aware I was scheduling monthly battery checks for later this year. Histos look fine. Pt agreeable.

## 2024-01-01 ENCOUNTER — Ambulatory Visit (HOSPITAL_BASED_OUTPATIENT_CLINIC_OR_DEPARTMENT_OTHER): Payer: PPO

## 2024-01-10 ENCOUNTER — Encounter: Payer: Self-pay | Admitting: Cardiovascular Disease

## 2024-01-16 ENCOUNTER — Ambulatory Visit: Payer: PPO | Attending: Cardiology | Admitting: Cardiology

## 2024-01-16 ENCOUNTER — Encounter: Payer: Self-pay | Admitting: Cardiology

## 2024-01-16 VITALS — BP 120/76 | HR 108 | Ht 77.0 in | Wt 230.0 lb

## 2024-01-16 DIAGNOSIS — I4892 Unspecified atrial flutter: Secondary | ICD-10-CM | POA: Diagnosis not present

## 2024-01-16 DIAGNOSIS — D6869 Other thrombophilia: Secondary | ICD-10-CM

## 2024-01-16 DIAGNOSIS — I480 Paroxysmal atrial fibrillation: Secondary | ICD-10-CM

## 2024-01-16 DIAGNOSIS — T148XXA Other injury of unspecified body region, initial encounter: Secondary | ICD-10-CM | POA: Diagnosis not present

## 2024-01-16 MED ORDER — METOPROLOL TARTRATE 50 MG PO TABS: 50.0000 mg | ORAL_TABLET | Freq: Once | ORAL | 0 refills | Status: DC

## 2024-01-16 NOTE — Progress Notes (Signed)
 Electrophysiology Office Note:   Date:  01/16/2024  ID:  [name], DOB 29-Aug-1946, MRN [id number]  Primary Cardiologist: None Electrophysiologist: Maurice Small, MD      History of Present Illness:   [name] is a 78 y.o. male with h/o complete heart block s/p pacemaker, non-obstructive CAD, HTN, HLD, atrial flutter who is being seen today for evaluation for Watchman device implant at the request of Dr. Nelly Laurence.  Discussed the use of AI scribe software for clinical note transcription with the patient, who gave verbal consent to proceed.  History of Present Illness   The patient, with a history of atrial fibrillation and on Eliquis, presents with numerous skin tears and bruising, which have significantly worsened since being on blood thinners. The patient reports that the skin tears are not painful but bleed profusely and take a long time to heal, increasing his risk for infection. He no longer buys bandaids becuase these are insufficient and buys pads instead. The patient has not experienced any life-threatening bleeds but has had to call in carpet cleaners twice due to the amount of bleeding that occurs. The patient also reports that he has not had any palpitations since having his pacemaker implanted. No new or acute complaints.     Review of systems complete and found to be negative unless listed in HPI.   EP Information / Studies Reviewed:    EKG is not ordered today. EKG from 11/29/23 reviewed which showed AS-VP rhythm.      Risk Assessment/Calculations:    CHA2DS2-VASc Score = 4   This indicates a 4.8% annual risk of stroke. The patient's score is based upon: CHF History: 0 HTN History: 1 Diabetes History: 0 Stroke History: 0 Vascular Disease History: 1 Age Score: 2 Gender Score: 0             Physical Exam:   VS:  BP 120/76 (BP Location: Left Arm, Patient Position: Sitting, Cuff Size: Large)   Pulse (!) 108   Ht 6\' 5"  (1.956 m)   Wt 230 lb (104.3 kg)    SpO2 95%   BMI 27.27 kg/m    Wt Readings from Last 3 Encounters:  01/16/24 230 lb (104.3 kg)  12/06/23 236 lb (107 kg)  11/29/23 235 lb (106.6 kg)     GEN: Well nourished, well developed in no acute distress NECK: No JVD CARDIAC: Tachycardic, regular.  RESPIRATORY:  Clear to auscultation without rales, wheezing or rhonchi  ABDOMEN: Soft, non-distended EXTREMITIES:  No edema; No deformity  Skin: Diffuse ecchymosis. Large skin tears on upper extremities. Healed skin tears over knees bilaterally.  ASSESSMENT AND PLAN:   I have seen [name] in the office today who is being considered for a Watchman left atrial appendage closure device. I believe they will benefit from this procedure given their history of atrial fibrillation, CHA2DS2-VASc score of 4 and unadjusted ischemic stroke rate of 4.8% per year. Unfortunately, the patient is not felt to be a long term anticoagulation candidate secondary to diffuse, recurrent skin bleeding. The patient has had numerous, large skin tears associated with prolonged bleeding and resulting in delayed wound healing, increasing his risk of infection. The patient's chart has been reviewed and I feel that they would be a candidate for short term oral anticoagulation after Watchman implant.   It is my belief that after undergoing a LAA closure procedure, [name] will not need long term anticoagulation which eliminates anticoagulation side effects and major bleeding risk.  Procedural risks for the Watchman implant have been reviewed with the patient including a 0.5% risk of stroke, <1% risk of perforation and <1% risk of device embolization. Other risks include bleeding, vascular damage, tamponade, worsening renal function, and death. The patient understands these risk and wishes to proceed.    The published clinical data on the safety and effectiveness of WATCHMAN include but are not limited to the following: - Holmes DR, Everlene Farrier, Sick P et al.  for the PROTECT AF Investigators. Percutaneous closure of the left atrial appendage versus warfarin therapy for prevention of stroke in patients with atrial fibrillation: a randomised non-inferiority trial. Lancet 2009; 374: 534-42. Everlene Farrier, Doshi SK, Isa Rankin D et al. on behalf of the PROTECT AF Investigators. Percutaneous Left Atrial Appendage Closure for Stroke Prophylaxis in Patients With Atrial Fibrillation 2.3-Year Follow-up of the PROTECT AF (Watchman Left Atrial Appendage System for Embolic Protection in Patients With Atrial Fibrillation) Trial. Circulation 2013; 127:720-729. - Alli O, Doshi S,  Kar S, Reddy VY, Sievert H et al. Quality of Life Assessment in the Randomized PROTECT AF (Percutaneous Closure of the Left Atrial Appendage Versus Warfarin Therapy for Prevention of Stroke in Patients With Atrial Fibrillation) Trial of Patients at Risk for Stroke With Nonvalvular Atrial Fibrillation. J Am Coll Cardiol 2013; 61:1790-8. Aline August DR, Mia Creek, Price M, Whisenant B, Sievert H, Doshi S, Huber K, Reddy V. Prospective randomized evaluation of the Watchman left atrial appendage Device in patients with atrial fibrillation versus long-term warfarin therapy; the PREVAIL trial. Journal of the [employer] of Cardiology, Vol. 4, No. 1, 2014, 1-11. - Kar S, Doshi SK, Sadhu A, Horton R, Osorio J et al. Primary outcome evaluation of a next-generation left atrial appendage closure device: results from the PINNACLE FLX trial. Circulation 2021;143(18)1754-1762.    After today's visit with the patient which was dedicated solely for shared decision making visit regarding LAA closure device, the patient decided to proceed with the LAA appendage closure procedure scheduled to be done in the near future at [facility]. Prior to the procedure, I would like to obtain a gated CT scan of the chest with contrast timed for PV/LA visualization.   Additionally, the patient will need an updated  echocardiogram.  HAS-BLED score 3 Hypertension Yes  Abnormal renal and liver function (Dialysis, transplant, Cr >2.26 mg/dL /Cirrhosis or Bilirubin >2x Normal or AST/ALT/AP >3x Normal) No  Stroke No  Bleeding Yes  Labile INR (Unstable/high INR) No  Elderly (>65) Yes  Drugs or alcohol (>= 8 drinks/week, anti-plt or NSAID) No   CHA2DS2-VASc Score = 4  The patient's score is based upon: CHF History: 0 HTN History: 1 Diabetes History: 0 Stroke History: 0 Vascular Disease History: 1 Age Score: 2 Gender Score: 0       ASSESSMENT AND PLAN: Paroxysmal Atrial Flutter: Device detected. The patient's CHA2DS2-VASc score is 4, indicating a 4.8% annual risk of stroke.    Secondary Hypercoagulable State (ICD10:  D68.69) The patient is at significant risk for stroke/thromboembolism based upon his CHA2DS2-VASc Score of 4.  Continue Apixaban (Eliquis).    Signed, Nobie Putnam, MD

## 2024-01-16 NOTE — Patient Instructions (Signed)
 Medication Instructions:  Your physician recommends that you continue on your current medications as directed. Please refer to the Current Medication list given to you today.  *If you need a refill on your cardiac medications before your next appointment, please call your pharmacy*  Testing/Procedures: Echocardiogram  Your physician has requested that you have an echocardiogram. Echocardiography is a painless test that uses sound waves to create images of your heart. It provides your doctor with information about the size and shape of your heart and how well your heart's chambers and valves are working. This procedure takes approximately one hour. There are no restrictions for this procedure. Please do NOT wear cologne, perfume, aftershave, or lotions (deodorant is allowed). Please arrive 15 minutes prior to your appointment time.  Cardiac CT  Your physician has requested that you have cardiac CT. Cardiac computed tomography (CT) is a painless test that uses an x-ray machine to take clear, detailed pictures of your heart.  Please follow instruction sheet as given.   Watchman Your physician has requested that you have Left atrial appendage (LAA) closure device implantation is a procedure to put a small device in the LAA of the heart. The LAA is a small sac in the wall of the heart's left upper chamber. Blood clots can form in this area. The device, Watchman closes the LAA to help prevent a blood clot and stroke.  After your pre-procedure testing is completed you will be contacted by Nurse Navigator, Karsten Fells to schedule your pre-procedure visit and procedure date. If you have any questions she can be reached at [phone].   Follow-Up: At [facility], you and your health needs are our priority.  As part of our continuing mission to provide you with exceptional heart care, we have created designated Provider Care Teams.  These Care Teams include your primary Cardiologist (physician)  and Advanced Practice Providers (APPs -  Physician Assistants and Nurse Practitioners) who all work together to provide you with the care you need, when you need it.  Your next appointment:   We will call you to arrange your follow up appointments

## 2024-01-30 NOTE — Addendum Note (Signed)
 Addended by: Elease Etienne A on: 01/30/2024 05:03 PM   Modules accepted: Orders

## 2024-01-30 NOTE — Progress Notes (Signed)
 Remote pacemaker transmission.

## 2024-02-05 ENCOUNTER — Other Ambulatory Visit (HOSPITAL_COMMUNITY)

## 2024-02-06 ENCOUNTER — Ambulatory Visit (HOSPITAL_COMMUNITY)
Admission: RE | Admit: 2024-02-06 | Discharge: 2024-02-06 | Disposition: A | Discharge status: Home / Self Care | Source: Ambulatory Visit | Attending: Internal Medicine | Admitting: Internal Medicine

## 2024-02-06 ENCOUNTER — Ambulatory Visit (HOSPITAL_BASED_OUTPATIENT_CLINIC_OR_DEPARTMENT_OTHER)
Admission: RE | Admit: 2024-02-06 | Discharge: 2024-02-06 | Disposition: A | Discharge status: Home / Self Care | Source: Ambulatory Visit | Attending: Cardiology | Admitting: Cardiology

## 2024-02-06 ENCOUNTER — Telehealth: Payer: Self-pay

## 2024-02-06 DIAGNOSIS — Z01810 Encounter for preprocedural cardiovascular examination: Secondary | ICD-10-CM | POA: Diagnosis not present

## 2024-02-06 DIAGNOSIS — I4892 Unspecified atrial flutter: Secondary | ICD-10-CM

## 2024-02-06 DIAGNOSIS — Z01818 Encounter for other preprocedural examination: Secondary | ICD-10-CM | POA: Diagnosis present

## 2024-02-06 DIAGNOSIS — I361 Nonrheumatic tricuspid (valve) insufficiency: Secondary | ICD-10-CM | POA: Insufficient documentation

## 2024-02-06 DIAGNOSIS — I517 Cardiomegaly: Secondary | ICD-10-CM | POA: Diagnosis not present

## 2024-02-06 MED ORDER — IOHEXOL 350 MG/ML SOLN
95.0000 mL | Freq: Once | INTRAVENOUS | Status: AC | PRN
  Administered 2024-02-06: 95 mL via INTRAVENOUS

## 2024-02-06 MED ORDER — PERFLUTREN LIPID MICROSPHERE
1.0000 mL | INTRAVENOUS | Status: DC | PRN
Start: 2024-02-06 — End: 2024-02-06
  Administered 2024-02-06: 2 mL via INTRAVENOUS

## 2024-02-06 NOTE — Telephone Encounter (Signed)
 Received call from the patient's dental office that he needs to have 3 teeth extracted due to infection and they are planning this for 02/14/2024. The patient has his pre-Watchman echo and CT later this afternoon which will decide if he is a candidate for Watchman. If anatomy is suitable, the patient had previously stated he wanted to proceed with LAAO on 4/17.  Discussed with [facility]. The patient will need a few weeks of uninterrupted therapy. His active dental infection needs to be taken care of and then if his anatomy is suitable for the Watchman, will postpone tentative procedure date to 5/1.  Will call [facility] with an update once CT is completed and reviewed by MD.  Corrie Dandy: [phone]

## 2024-02-07 LAB — ECHOCARDIOGRAM COMPLETE
Calc EF: 34.9 %
Est EF: 30
S' Lateral: 4.6 cm
Single Plane A2C EF: 28 %
Single Plane A4C EF: 33.7 %

## 2024-02-10 DIAGNOSIS — N1831 Chronic kidney disease, stage 3a: Secondary | ICD-10-CM | POA: Insufficient documentation

## 2024-02-10 DIAGNOSIS — N183 Chronic kidney disease, stage 3 unspecified: Secondary | ICD-10-CM | POA: Insufficient documentation

## 2024-02-10 NOTE — Patient Instructions (Addendum)
      Blood work was ordered.       Medications changes include :   increase simvastatin to 80 mg daily     Return in about 6 months (around 08/15/2024) for Physical Exam.

## 2024-02-10 NOTE — Progress Notes (Unsigned)
 Subjective:    Patient ID: [name], male    DOB: February 04, 1946, 78 y.o.   MRN: [id number]     HPI [name] is here for follow up of his chronic medical problems.   4/17 - will likely get the watchman  Has infection in lower gum - had three teeth removed  - will get temp bridge and then permanent bridge after the watchman.   LDL not at goal    Medications and allergies reviewed with patient and updated if appropriate.  Current Outpatient Medications on File Prior to Visit  Medication Sig Dispense Refill   albuterol (VENTOLIN HFA) 108 (90 Base) MCG/ACT inhaler INHALE 1-2 PUFFS BY MOUTH INTO THE LUNGS EVERY 6 HOURS AS NEEDED. 8.5 each 2   allopurinol (ZYLOPRIM) 300 MG tablet TAKE 1/2 TABLET BY MOUTH DAILY 45 tablet 1   amLODipine (NORVASC) 5 MG tablet Take 1 tablet (5 mg total) by mouth daily. 30 tablet 2   apixaban (ELIQUIS) 5 MG TABS tablet TAKE 1 TABLET BY MOUTH TWICE A DAY 180 tablet 1   benzonatate (TESSALON) 200 MG capsule TAKE 1 CAPSULE BY MOUTH THREE TIMES A DAY AS NEEDED 45 capsule 1   famotidine (PEPCID) 20 MG tablet TAKE 1 TABLET BY MOUT AFTER SUPPER 90 tablet 3   gabapentin (NEURONTIN) 300 MG capsule Take 1 capsule (300 mg total) by mouth 3 (three) times daily. (Patient taking differently: Take 300 mg by mouth 2 (two) times daily.) 270 capsule 2   pantoprazole (PROTONIX) 40 MG tablet TAKE 1 TABLET (40 MG TOTAL) BY MOUTH DAILY. TAKE 30-60 MIN BEFORE FIRST MEAL OF THE DAY 90 tablet 3   sildenafil (REVATIO) 20 MG tablet TAKE 3 TO 5 TABLETS BY MOUTH AS NEEDED 100 tablet 3   simvastatin (ZOCOR) 40 MG tablet Take 1 tablet (40 mg total) by mouth daily at 6 PM. 90 tablet 2   Tiotropium Bromide-Olodaterol (STIOLTO RESPIMAT) 2.5-2.5 MCG/ACT AERS Inhale 2 puffs into the lungs daily. 1 each 5   No current facility-administered medications on file prior to visit.     Review of Systems  Constitutional:  Negative for fever.  HENT:  Negative for postnasal drip.    Respiratory:  Positive for cough. Negative for shortness of breath and wheezing.   Cardiovascular:  Negative for chest pain, palpitations and leg swelling.  Gastrointestinal:        No gerd  Neurological:  Negative for light-headedness and headaches.       Objective:   Vitals:   02/14/24 0818  BP: 126/70  Pulse: 90  Temp: 98.3 F (36.8 C)  SpO2: 95%   BP Readings from Last 3 Encounters:  02/14/24 126/70  02/06/24 (!) 140/81  01/16/24 120/76   Wt Readings from Last 3 Encounters:  02/14/24 235 lb (106.6 kg)  01/16/24 230 lb (104.3 kg)  12/06/23 236 lb (107 kg)   Body mass index is 27.87 kg/m.    Physical Exam Constitutional:      General: He is not in acute distress.    Appearance: Normal appearance. He is not ill-appearing.  HENT:     Head: Normocephalic and atraumatic.  Eyes:     Conjunctiva/sclera: Conjunctivae normal.  Cardiovascular:     Rate and Rhythm: Normal rate and regular rhythm.     Heart sounds: Normal heart sounds.  Pulmonary:     Effort: Pulmonary effort is normal. No respiratory distress.     Breath sounds: Normal breath sounds. No wheezing  or rales.  Musculoskeletal:     Right lower leg: No edema.     Left lower leg: No edema.  Skin:    General: Skin is warm and dry.     Findings: No rash.  Neurological:     Mental Status: He is alert. Mental status is at baseline.  Psychiatric:        Mood and Affect: Mood normal.        Lab Results  Component Value Date   WBC 5.2 08/16/2023   HGB 15.1 08/16/2023   HCT 45.4 08/16/2023   PLT 160.0 08/16/2023   GLUCOSE 98 08/16/2023   CHOL 177 08/16/2023   TRIG 103.0 08/16/2023   HDL 74.00 08/16/2023   LDLDIRECT 109.1 08/13/2008   LDLCALC 82 08/16/2023   ALT 32 08/16/2023   AST 33 08/16/2023   NA 138 08/16/2023   K 4.5 08/16/2023   CL 100 08/16/2023   CREATININE 1.22 08/16/2023   BUN 14 08/16/2023   CO2 29 08/16/2023   TSH 3.78 02/02/2021   PSA 2.77 03/14/2023   INR 1.02 01/13/2011    HGBA1C 5.4 08/16/2023     Assessment & Plan:    See Problem List for Assessment and Plan of chronic medical problems.

## 2024-02-12 ENCOUNTER — Telehealth: Payer: Self-pay

## 2024-02-12 NOTE — Telephone Encounter (Signed)
-----   Message from Nobie Putnam sent at 02/06/2024  7:38 PM EDT ----- Still awaiting echo but should be good to proceed with scheduling for Watchman based on CT. ----- Message ----- From: Interface, Rad Results In Sent: 02/06/2024   5:04 PM EDT To: Nobie Putnam, MD

## 2024-02-12 NOTE — Telephone Encounter (Signed)
 Per Dr. Jimmey Ralph, the patient's anatomy is suitable for LAAO.  Will await review of echocardiogram.

## 2024-02-13 ENCOUNTER — Telehealth: Payer: Self-pay

## 2024-02-13 NOTE — Telephone Encounter (Signed)
 The patient called to review pre-Watchman testing. Informed him that while his pre-procedure CT showed anatomy was suitable for LAAO, Dr. Jimmey Ralph has yet to review his echocardiogram.  He understood he will be called to arrange LAAO once echo is reviewed.  While on the phone, he reported he had several teeth extracted yesterday and is going for a bridge this afternoon. He never had to hold his Eliquis.  Pending echo review, he would like to proceed with LAAO ASAP (02/28/2024).  [place]  Will route to Dr. Jimmey Ralph for review.  LVEF=30 with no prior echocardiograms in Epic.

## 2024-02-14 ENCOUNTER — Ambulatory Visit: Payer: PPO | Admitting: Internal Medicine

## 2024-02-14 ENCOUNTER — Encounter: Payer: Self-pay | Admitting: Internal Medicine

## 2024-02-14 VITALS — BP 126/70 | HR 90 | Temp 98.3°F | Ht 77.0 in | Wt 235.0 lb

## 2024-02-14 DIAGNOSIS — E7849 Other hyperlipidemia: Secondary | ICD-10-CM | POA: Diagnosis not present

## 2024-02-14 DIAGNOSIS — R822 Biliuria: Secondary | ICD-10-CM

## 2024-02-14 DIAGNOSIS — R739 Hyperglycemia, unspecified: Secondary | ICD-10-CM | POA: Diagnosis not present

## 2024-02-14 DIAGNOSIS — M1A00X0 Idiopathic chronic gout, unspecified site, without tophus (tophi): Secondary | ICD-10-CM

## 2024-02-14 DIAGNOSIS — N1831 Chronic kidney disease, stage 3a: Secondary | ICD-10-CM | POA: Diagnosis not present

## 2024-02-14 DIAGNOSIS — I10 Essential (primary) hypertension: Secondary | ICD-10-CM

## 2024-02-14 DIAGNOSIS — I2510 Atherosclerotic heart disease of native coronary artery without angina pectoris: Secondary | ICD-10-CM

## 2024-02-14 DIAGNOSIS — G8929 Other chronic pain: Secondary | ICD-10-CM

## 2024-02-14 DIAGNOSIS — N529 Male erectile dysfunction, unspecified: Secondary | ICD-10-CM | POA: Diagnosis not present

## 2024-02-14 DIAGNOSIS — R519 Headache, unspecified: Secondary | ICD-10-CM | POA: Diagnosis not present

## 2024-02-14 LAB — CBC WITH DIFFERENTIAL/PLATELET
Basophils Absolute: 0 10*3/uL (ref 0.0–0.1)
Basophils Relative: 0.6 % (ref 0.0–3.0)
Eosinophils Absolute: 0.1 10*3/uL (ref 0.0–0.7)
Eosinophils Relative: 2 % (ref 0.0–5.0)
HCT: 45.1 % (ref 39.0–52.0)
Hemoglobin: 15.5 g/dL (ref 13.0–17.0)
Lymphocytes Relative: 33 % (ref 12.0–46.0)
Lymphs Abs: 1.9 10*3/uL (ref 0.7–4.0)
MCHC: 34.5 g/dL (ref 30.0–36.0)
MCV: 105.6 fl — ABNORMAL HIGH (ref 78.0–100.0)
Monocytes Absolute: 0.6 10*3/uL (ref 0.1–1.0)
Monocytes Relative: 9.8 % (ref 3.0–12.0)
Neutro Abs: 3.2 10*3/uL (ref 1.4–7.7)
Neutrophils Relative %: 54.6 % (ref 43.0–77.0)
Platelets: 196 10*3/uL (ref 150.0–400.0)
RBC: 4.27 Mil/uL (ref 4.22–5.81)
RDW: 14.3 % (ref 11.5–15.5)
WBC: 5.8 10*3/uL (ref 4.0–10.5)

## 2024-02-14 LAB — LIPID PANEL
Cholesterol: 168 mg/dL (ref 0–200)
HDL: 54.8 mg/dL (ref >39.00)
LDL Cholesterol: 90 mg/dL (ref 0–99)
NonHDL: 113.01
Total CHOL/HDL Ratio: 3
Triglycerides: 115 mg/dL (ref 0.0–149.0)
VLDL: 23 mg/dL (ref 0.0–40.0)

## 2024-02-14 LAB — COMPREHENSIVE METABOLIC PANEL WITH GFR
ALT: 36 U/L (ref 0–53)
AST: 39 U/L — ABNORMAL HIGH (ref 0–37)
Albumin: 4.3 g/dL (ref 3.5–5.2)
Alkaline Phosphatase: 37 U/L — ABNORMAL LOW (ref 39–117)
BUN: 11 mg/dL (ref 6–23)
CO2: 27 meq/L (ref 19–32)
Calcium: 9.6 mg/dL (ref 8.4–10.5)
Chloride: 102 meq/L (ref 96–112)
Creatinine, Ser: 1.15 mg/dL (ref 0.40–1.50)
GFR: 61.99 mL/min (ref >60.00)
Glucose, Bld: 108 mg/dL — ABNORMAL HIGH (ref 70–99)
Potassium: 3.8 meq/L (ref 3.5–5.1)
Sodium: 139 meq/L (ref 135–145)
Total Bilirubin: 0.7 mg/dL (ref 0.2–1.2)
Total Protein: 7 g/dL (ref 6.0–8.3)

## 2024-02-14 LAB — URINALYSIS, ROUTINE W REFLEX MICROSCOPIC
Hgb urine dipstick: NEGATIVE
Leukocytes,Ua: NEGATIVE
Nitrite: NEGATIVE
Specific Gravity, Urine: 1.02 (ref 1.000–1.030)
Total Protein, Urine: 30 — AB
Urine Glucose: NEGATIVE
Urobilinogen, UA: 4 — AB (ref 0.0–1.0)
pH: 6.5 (ref 5.0–8.0)

## 2024-02-14 LAB — HEMOGLOBIN A1C: Hgb A1c MFr Bld: 5.5 % (ref 4.6–6.5)

## 2024-02-14 LAB — MICROALBUMIN / CREATININE URINE RATIO
Creatinine,U: 243.5 mg/dL
Microalb Creat Ratio: 57.6 mg/g — ABNORMAL HIGH (ref 0.0–30.0)
Microalb, Ur: 14 mg/dL — ABNORMAL HIGH (ref 0.0–1.9)

## 2024-02-14 MED ORDER — SIMVASTATIN 80 MG PO TABS: 80.0000 mg | ORAL_TABLET | Freq: Every day | ORAL | 1 refills | Status: DC

## 2024-02-14 NOTE — Assessment & Plan Note (Signed)
 Chronic Lab Results  Component Value Date   HGBA1C 5.4 08/16/2023    Low sugar / carb diet Stressed regular exercise

## 2024-02-14 NOTE — Assessment & Plan Note (Addendum)
 Chronic CT CAC 1550 Stressed healthy diet, regular exercise On simvastatin 40 mg daily-LDL not at goal - increase to 80 mg daily -- has not tolerated atorvastatin in the past and prefers to stay on the same medication On Eliquis 5 mg twice daily

## 2024-02-14 NOTE — Assessment & Plan Note (Signed)
 Chronic Blood pressure controlled CMP, CBC Continue amlodipine 5 mg daily

## 2024-02-14 NOTE — Assessment & Plan Note (Signed)
Chronic Taking gabapentin 300 mg bid Headaches controlled - occasionally takes tylenol Continue above

## 2024-02-14 NOTE — Assessment & Plan Note (Signed)
 Chronic Stable CBC, CMP Stressed good blood pressure control Stressed good water intake Check UA, urine microalbumin

## 2024-02-14 NOTE — Assessment & Plan Note (Addendum)
 Chronic CT CAC 1550 Regular exercise and healthy diet encouraged Discussed goal of LDL < 70 Check lipids Currently taking simvastatin 40 mg daily - increase to 80 mg daily  Lab Results  Component Value Date   LDLCALC 82 08/16/2023

## 2024-02-14 NOTE — Assessment & Plan Note (Signed)
Chronic.  Continue sildenafil as needed.

## 2024-02-14 NOTE — Assessment & Plan Note (Signed)
Chronic No issues with gout recently-controlled Continue allopurinol 150 mg daily

## 2024-02-15 ENCOUNTER — Telehealth: Payer: Self-pay

## 2024-02-15 NOTE — Telephone Encounter (Signed)
 Jonathan Holts, RN 02/15/2024 11:58 AM EDT     Spoke with patient, reviewed recent testing and Dr Lavone Neri recommendations. Office visit scheduled on 03/03/24 at 11:15 with Dr Nelly Laurence (soonest availability) to update H&P and EKG. Left heart cath scheduled for 03/04/24, instruction letter reviewed with patient over the phone and sent thru MyChart. No questions at this time       Nobie Putnam, MD 02/14/2024  9:51 PM EDT     Echocardiogram shows low LVEF, previously not known.. Likely pacing induced, but given presence of significant coronary calcification on his pre-Watchman CT, I think he needs LHC to rule out an ischemic etiology prior to performing Watchman implant. If LHC normal, then okay to pursue Watchman implant. May ultimately need pacemaker upgrade

## 2024-02-17 ENCOUNTER — Encounter: Payer: Self-pay | Admitting: Internal Medicine

## 2024-02-17 NOTE — Addendum Note (Signed)
 Addended by: Pincus Sanes on: 02/17/2024 10:12 AM   Modules accepted: Orders

## 2024-02-24 ENCOUNTER — Other Ambulatory Visit: Payer: Self-pay | Admitting: Internal Medicine

## 2024-02-25 ENCOUNTER — Ambulatory Visit (INDEPENDENT_AMBULATORY_CARE_PROVIDER_SITE_OTHER)

## 2024-02-25 DIAGNOSIS — I442 Atrioventricular block, complete: Secondary | ICD-10-CM

## 2024-02-26 LAB — CUP PACEART REMOTE DEVICE CHECK
Battery Impedance: 6386 Ohm
Battery Remaining Longevity: 2 mo
Battery Voltage: 2.6 V
Brady Statistic AP VP Percent: 2 %
Brady Statistic AP VS Percent: 0 %
Brady Statistic AS VP Percent: 98 %
Brady Statistic AS VS Percent: 0 %
Date Time Interrogation Session: 20250414104905
Implantable Lead Connection Status: 753985
Implantable Lead Connection Status: 753985
Implantable Lead Implant Date: 20120302
Implantable Lead Implant Date: 20120302
Implantable Lead Location: 753859
Implantable Lead Location: 753860
Implantable Lead Model: 5076
Implantable Pulse Generator Implant Date: 20120302
Lead Channel Impedance Value: 498 Ohm
Lead Channel Impedance Value: 597 Ohm
Lead Channel Pacing Threshold Amplitude: 0.625 V
Lead Channel Pacing Threshold Amplitude: 0.625 V
Lead Channel Pacing Threshold Pulse Width: 0.4 ms
Lead Channel Pacing Threshold Pulse Width: 0.4 ms
Lead Channel Setting Pacing Amplitude: 2 V
Lead Channel Setting Pacing Amplitude: 2 V
Lead Channel Setting Pacing Pulse Width: 0.4 ms
Lead Channel Setting Sensing Sensitivity: 2.8 mV
Zone Setting Status: 755011
Zone Setting Status: 755011

## 2024-03-03 ENCOUNTER — Telehealth: Payer: Self-pay | Admitting: *Deleted

## 2024-03-03 ENCOUNTER — Encounter: Payer: Self-pay | Admitting: Cardiovascular Disease

## 2024-03-03 ENCOUNTER — Ambulatory Visit: Attending: Cardiovascular Disease | Admitting: Cardiovascular Disease

## 2024-03-03 VITALS — BP 134/90 | HR 95 | Ht 77.0 in | Wt 235.0 lb

## 2024-03-03 DIAGNOSIS — I4892 Unspecified atrial flutter: Secondary | ICD-10-CM | POA: Diagnosis not present

## 2024-03-03 DIAGNOSIS — I442 Atrioventricular block, complete: Secondary | ICD-10-CM

## 2024-03-03 DIAGNOSIS — I471 Supraventricular tachycardia, unspecified: Secondary | ICD-10-CM | POA: Diagnosis not present

## 2024-03-03 LAB — CUP PACEART INCLINIC DEVICE CHECK
Date Time Interrogation Session: 20250421124904
Implantable Lead Connection Status: 753985
Implantable Lead Connection Status: 753985
Implantable Lead Implant Date: 20120302
Implantable Lead Implant Date: 20120302
Implantable Lead Location: 753859
Implantable Lead Location: 753860
Implantable Lead Model: 5076
Implantable Pulse Generator Implant Date: 20120302

## 2024-03-03 NOTE — Telephone Encounter (Signed)
 Cardiac Catheterization scheduled at [facility] for: Tuesday March 04, 2024 7:30 AM Arrival time [facility] Main Entrance A at: 5:30 AM  Nothing to eat after midnight prior to procedure, clear liquids until 5 AM day of procedure.  Medication instructions: -Hold:  Eliquis -none 03/02/24 until post procedure -Other usual morning medications can be taken with sips of water including aspirin  81 mg.  Plan to go home the same day, you will only stay overnight if medically necessary.  You must have responsible adult to drive you home.  Someone must be with you the first 24 hours after you arrive home.  Reviewed procedure instructions with patient.

## 2024-03-03 NOTE — Progress Notes (Signed)
     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  Since last being seen in our clinic, the patient reports doing very well.  Today, he denies symptoms of palpitations, chest pain, shortness of breath,  lower extremity edema, dizziness, presyncope, or syncope.  The patient is otherwise without complaint today.   He has had sustained atrial high rate episodes detected. The atrial rate was about 250 bpm when EGM's are present. Often, only marker channel data is available.V rates are controlled, pt is V-paced.    He has had multiple episodes of severe bleeding and is once had to call a cleaning company to assist with clean up after bleeding episode.  His skin is very thin and tears easily.  He was referred to Dr. Daneil Dunker to discuss watchman and thought to be a good candidate for left atrial appendage closure.  During evaluation, he was referred for an echocardiogram which showed an LV ejection fraction of 30%.  He is scheduled to undergo coronary angiogram.  he has no device related complaints -- no new tenderness, drainage, redness.     Physical Exam: Vitals:   03/03/24 1130  BP: (!) 134/90  Pulse: 95  SpO2: 95%  Weight: 235 lb (106.6 kg)  Height: 6\' 5"  (1.956 m)      GEN- The patient is well appearing, alert and oriented x 3 today.   Head- normocephalic, atraumatic Lungs- normal work of breathing Chest- pacemaker pocket is well healed Heart- Regular rate and rhythm, no murmurs, rubs or gallops, PMI not laterally displaced GI- soft, NT, ND, + BS Extremities- no clubbing, cyanosis, or edema  Pacemaker interrogation- reviewed in detail today,  See PACEART report  ekg tracing ordered today is personally reviewed and shows AV paced  Assessment and Plan:  Symptomatic complete heart block Normal pacemaker function See Pace Art report High burden of V pacing Battery is approaching  ERI. He has an adapta.  I anticipate he will need upgrade to CRT at the time of generator change -- however, if he has significant CAD that is revascularized, and his battery life extends > 3 months, we would re-evaluate his EF.  Atrial high rate episodes --  Noted on PPM -- completely asymptomatic, V-rate controlled. The few actual EGMs recorded appear most consistent with flutter. Atrial rates approximately 250 bpm.  Will continue to monitor through device. No changes  CHF with reduced ejection fraction EF 30% March 2025 He is scheduled for coronary angiogram If no active coronary disease, will need to plan upgrade to CRT device  Secondary hypercoagulable state He has been having profuse bleeding Undergoing evaluation for LAA occlusion with Dr. Beckie Bow, MD  03/03/2024 11:39 AM

## 2024-03-03 NOTE — H&P (View-Only) (Signed)
     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  Since last being seen in our clinic, the patient reports doing very well.  Today, he denies symptoms of palpitations, chest pain, shortness of breath,  lower extremity edema, dizziness, presyncope, or syncope.  The patient is otherwise without complaint today.   He has had sustained atrial high rate episodes detected. The atrial rate was about 250 bpm when EGM's are present. Often, only marker channel data is available.V rates are controlled, pt is V-paced.    He has had multiple episodes of severe bleeding and is once had to call a cleaning company to assist with clean up after bleeding episode.  His skin is very thin and tears easily.  He was referred to Dr. Daneil Dunker to discuss watchman and thought to be a good candidate for left atrial appendage closure.  During evaluation, he was referred for an echocardiogram which showed an LV ejection fraction of 30%.  He is scheduled to undergo coronary angiogram.  he has no device related complaints -- no new tenderness, drainage, redness.     Physical Exam: Vitals:   03/03/24 1130  BP: (!) 134/90  Pulse: 95  SpO2: 95%  Weight: 235 lb (106.6 kg)  Height: 6\' 5"  (1.956 m)      GEN- The patient is well appearing, alert and oriented x 3 today.   Head- normocephalic, atraumatic Lungs- normal work of breathing Chest- pacemaker pocket is well healed Heart- Regular rate and rhythm, no murmurs, rubs or gallops, PMI not laterally displaced GI- soft, NT, ND, + BS Extremities- no clubbing, cyanosis, or edema  Pacemaker interrogation- reviewed in detail today,  See PACEART report  ekg tracing ordered today is personally reviewed and shows AV paced  Assessment and Plan:  Symptomatic complete heart block Normal pacemaker function See Pace Art report High burden of V pacing Battery is approaching  ERI. He has an adapta.  I anticipate he will need upgrade to CRT at the time of generator change -- however, if he has significant CAD that is revascularized, and his battery life extends > 3 months, we would re-evaluate his EF.  Atrial high rate episodes --  Noted on PPM -- completely asymptomatic, V-rate controlled. The few actual EGMs recorded appear most consistent with flutter. Atrial rates approximately 250 bpm.  Will continue to monitor through device. No changes  CHF with reduced ejection fraction EF 30% March 2025 He is scheduled for coronary angiogram If no active coronary disease, will need to plan upgrade to CRT device  Secondary hypercoagulable state He has been having profuse bleeding Undergoing evaluation for LAA occlusion with Dr. Beckie Bow, MD  03/03/2024 11:39 AM

## 2024-03-03 NOTE — Patient Instructions (Signed)
 Medication Instructions:  Your physician recommends that you continue on your current medications as directed. Please refer to the Current Medication list given to you today. *If you need a refill on your cardiac medications before your next appointment, please call your pharmacy*  Testing/Procedures: Cardiac Catheterization - see instruction letter Your physician has requested that you have a cardiac catheterization. Cardiac catheterization is used to diagnose and/or treat various heart conditions. Doctors may recommend this procedure for a number of different reasons. The most common reason is to evaluate chest pain. Chest pain can be a symptom of coronary artery disease (CAD), and cardiac catheterization can show whether plaque is narrowing or blocking your heart's arteries. This procedure is also used to evaluate the valves, as well as measure the blood flow and oxygen levels in different parts of your heart. For further information please visit [email or web address] Please follow instruction sheet, as given.    Follow-Up: At [facility], you and your health needs are our priority.  As part of our continuing mission to provide you with exceptional heart care, our providers are all part of one team.  This team includes your primary Cardiologist (physician) and Advanced Practice Providers or APPs (Physician Assistants and Nurse Practitioners) who all work together to provide you with the care you need, when you need it.  Your next appointment:   We will contact you to set up your generator change after results of your heart cath are available.   Provider:   Marlane Silver, MD        1st Floor: - Lobby - Registration  - Pharmacy  - Lab - Cafe  2nd Floor: - PV Lab - Diagnostic Testing (echo, CT, nuclear med)  3rd Floor: - Vacant  4th Floor: - TCTS (cardiothoracic surgery) - AFib Clinic - Structural Heart Clinic - Vascular Surgery  - Vascular Ultrasound  5th  Floor: - HeartCare Cardiology (general and EP) - Clinical Pharmacy for coumadin, hypertension, lipid, weight-loss medications, and med management appointments    Valet parking services will be available as well.

## 2024-03-04 ENCOUNTER — Other Ambulatory Visit: Payer: Self-pay

## 2024-03-04 ENCOUNTER — Encounter (HOSPITAL_COMMUNITY): Payer: Self-pay | Admitting: Cardiovascular Disease

## 2024-03-04 ENCOUNTER — Encounter (HOSPITAL_COMMUNITY)
Admission: RE | Disposition: A | Discharge status: Home / Self Care | Payer: Self-pay | Source: Home / Self Care | Attending: Cardiovascular Disease

## 2024-03-04 ENCOUNTER — Ambulatory Visit (HOSPITAL_COMMUNITY)
Admission: RE | Admit: 2024-03-04 | Discharge: 2024-03-04 | Disposition: A | Discharge status: Home / Self Care | Attending: Cardiovascular Disease | Admitting: Cardiovascular Disease

## 2024-03-04 DIAGNOSIS — I2510 Atherosclerotic heart disease of native coronary artery without angina pectoris: Secondary | ICD-10-CM | POA: Diagnosis not present

## 2024-03-04 DIAGNOSIS — Z950 Presence of cardiac pacemaker: Secondary | ICD-10-CM | POA: Insufficient documentation

## 2024-03-04 DIAGNOSIS — D6869 Other thrombophilia: Secondary | ICD-10-CM | POA: Diagnosis not present

## 2024-03-04 DIAGNOSIS — I5020 Unspecified systolic (congestive) heart failure: Secondary | ICD-10-CM | POA: Insufficient documentation

## 2024-03-04 HISTORY — PX: LEFT HEART CATH AND CORONARY ANGIOGRAPHY: CATH118249

## 2024-03-04 SURGERY — LEFT HEART CATH AND CORONARY ANGIOGRAPHY
Anesthesia: LOCAL

## 2024-03-04 MED ORDER — FENTANYL CITRATE (PF) 100 MCG/2ML IJ SOLN
INTRAMUSCULAR | Status: DC | PRN
  Administered 2024-03-04: 25 ug via INTRAVENOUS

## 2024-03-04 MED ORDER — VERAPAMIL HCL 2.5 MG/ML IV SOLN
INTRAVENOUS | Status: AC
  Filled 2024-03-04: qty 2

## 2024-03-04 MED ORDER — FENTANYL CITRATE (PF) 100 MCG/2ML IJ SOLN
INTRAMUSCULAR | Status: AC
  Filled 2024-03-04: qty 2

## 2024-03-04 MED ORDER — LIDOCAINE HCL (PF) 1 % IJ SOLN
INTRAMUSCULAR | Status: DC | PRN
  Administered 2024-03-04: 2 mL

## 2024-03-04 MED ORDER — MIDAZOLAM HCL 2 MG/2ML IJ SOLN
INTRAMUSCULAR | Status: DC | PRN
Start: 2024-03-04 — End: 2024-03-04
  Administered 2024-03-04: 1 mg via INTRAVENOUS

## 2024-03-04 MED ORDER — SODIUM CHLORIDE 0.9% FLUSH: 3.0000 mL | Freq: Two times a day (BID) | INTRAVENOUS | Status: DC

## 2024-03-04 MED ORDER — VERAPAMIL HCL 2.5 MG/ML IV SOLN
INTRAVENOUS | Status: DC | PRN
  Administered 2024-03-04: 10 mL via INTRA_ARTERIAL

## 2024-03-04 MED ORDER — IOHEXOL 350 MG/ML SOLN
INTRAVENOUS | Status: DC | PRN
  Administered 2024-03-04: 50 mL via INTRA_ARTERIAL

## 2024-03-04 MED ORDER — HEPARIN (PORCINE) IN NACL 1000-0.9 UT/500ML-% IV SOLN
INTRAVENOUS | Status: DC | PRN
  Administered 2024-03-04: 1000 mL via INTRA_ARTERIAL

## 2024-03-04 MED ORDER — HEPARIN SODIUM (PORCINE) 1000 UNIT/ML IJ SOLN
INTRAMUSCULAR | Status: DC | PRN
  Administered 2024-03-04: 5000 [IU] via INTRAVENOUS

## 2024-03-04 MED ORDER — SODIUM CHLORIDE 0.9 % IV SOLN
INTRAVENOUS | Status: DC | PRN
  Administered 2024-03-04: 10 mL/h via INTRAVENOUS

## 2024-03-04 MED ORDER — ACETAMINOPHEN 325 MG PO TABS: 650.0000 mg | ORAL_TABLET | ORAL | Status: DC | PRN

## 2024-03-04 MED ORDER — HYDRALAZINE HCL 20 MG/ML IJ SOLN: 10.0000 mg | INTRAMUSCULAR | Status: DC | PRN

## 2024-03-04 MED ORDER — SODIUM CHLORIDE 0.9 % IV SOLN: INTRAVENOUS | Status: DC

## 2024-03-04 MED ORDER — MIDAZOLAM HCL 2 MG/2ML IJ SOLN
INTRAMUSCULAR | Status: AC
  Filled 2024-03-04: qty 2

## 2024-03-04 MED ORDER — ASPIRIN 81 MG PO CHEW: 81.0000 mg | CHEWABLE_TABLET | ORAL | Status: DC

## 2024-03-04 MED ORDER — LABETALOL HCL 5 MG/ML IV SOLN: 10.0000 mg | INTRAVENOUS | Status: DC | PRN

## 2024-03-04 MED ORDER — HEPARIN SODIUM (PORCINE) 1000 UNIT/ML IJ SOLN
INTRAMUSCULAR | Status: AC
Start: 2024-03-04 — End: ?
  Filled 2024-03-04: qty 10

## 2024-03-04 MED ORDER — SODIUM CHLORIDE 0.9% FLUSH: 3.0000 mL | INTRAVENOUS | Status: DC | PRN

## 2024-03-04 MED ORDER — SODIUM CHLORIDE 0.9 % IV SOLN: 250.0000 mL | INTRAVENOUS | Status: DC | PRN

## 2024-03-04 MED ORDER — LIDOCAINE HCL (PF) 1 % IJ SOLN
INTRAMUSCULAR | Status: AC
  Filled 2024-03-04: qty 30

## 2024-03-04 MED ORDER — ONDANSETRON HCL 4 MG/2ML IJ SOLN: 4.0000 mg | Freq: Four times a day (QID) | INTRAMUSCULAR | Status: DC | PRN

## 2024-03-04 SURGICAL SUPPLY — 6 items
CATH 5FR JL3.5 JR4 ANG PIG MP (CATHETERS) IMPLANT
DEVICE RAD COMP TR BAND LRG (VASCULAR PRODUCTS) IMPLANT
GLIDESHEATH SLEND SS 6F .021 (SHEATH) IMPLANT
GUIDEWIRE INQWIRE 1.5J.035X260 (WIRE) IMPLANT
PACK CARDIAC CATHETERIZATION (CUSTOM PROCEDURE TRAY) ×1 IMPLANT
SET ATX-X65L (MISCELLANEOUS) IMPLANT

## 2024-03-04 NOTE — Discharge Instructions (Signed)
 Radial Site Care The following information offers guidance on how to care for yourself after your procedure. Your health care provider may also give you more specific instructions. If you have problems or questions, contact your health care provider. What can I expect after the procedure? After the procedure, it is common to have bruising and tenderness in the incision area. Follow these instructions at home: Incision site care  Follow instructions from your health care provider about how to take care of your incision site. Make sure you: Wash your hands with soap and water for at least 20 seconds before and after you change your bandage (dressing). If soap and water are not available, use hand sanitizer. Remove your dressing in 24 hours. Leave stitches (sutures), skin glue, or adhesive strips in place. These skin closures may need to stay in place for 2 weeks or longer. If adhesive strip edges start to loosen and curl up, you may trim the loose edges. Do not remove adhesive strips completely unless your health care provider tells you to do that. Do not take baths, swim, or use a hot tub for at least 1 week. You may shower 24 hours after the procedure or as told by your health care provider. Remove the dressing and gently wash the incision area with plain soap and water. Pat the area dry with a clean towel. Do not rub the site. That could cause bleeding. Do not apply powder or lotion to the site. Check your incision site every day for signs of infection. Check for: Redness, swelling, or pain. Fluid or blood. Warmth. Pus or a bad smell. Activity For 24 hours after the procedure, or as directed by your health care provider: Do not flex or bend the affected arm. Do not push or pull heavy objects with the affected arm. Do not operate machinery or power tools. Do not drive. You should not drive yourself home from the hospital or clinic if you go home during that time period. You may drive 24  hours after the procedure unless your health care provider tells you not to. Do not lift anything that is heavier than 10 lb (4.5 kg), or the limit that you are told, until your health care provider says that it is safe. Return to your normal activities as told by your health care provider. Ask your health care provider what activities are safe for you and when you can return to work. If you were given a sedative during the procedure, it can affect you for several hours. Do not drive or operate machinery until your health care provider says that it is safe. General instructions Take over-the-counter and prescription medicines only as told by your health care provider. If you will be going home right after the procedure, plan to have a responsible adult care for you for the time you are told. This is important. Keep all follow-up visits. This is important. Contact a health care provider if: You have a fever or chills. You have any of these signs of infection at your incision site: Redness, swelling, or pain. Fluid or blood. Warmth. Pus or a bad smell. Get help right away if: The incision area swells very fast. The incision area is bleeding, and the bleeding does not stop when you hold steady pressure on the area. Your arm or hand becomes pale, cool, tingly, or numb. These symptoms may represent a serious problem that is an emergency. Do not wait to see if the symptoms will go away. Get medical  help right away. Call your local emergency services (911 in the [place] Do not drive yourself to the hospital. Summary After the procedure, it is common to have bruising and tenderness at the incision site. Follow instructions from your health care provider about how to take care of your radial site incision. Check the incision every day for signs of infection. Do not lift anything that is heavier than 10 lb (4.5 kg), or the limit that you are told, until your health care provider says that it is  safe. Get help right away if the incision area swells very fast, you have bleeding at the incision site that will not stop, or your arm or hand becomes pale, cool, or numb. This information is not intended to replace advice given to you by your health care provider. Make sure you discuss any questions you have with your health care provider. Document Revised: 12/19/2020 Document Reviewed: 12/19/2020 Elsevier Patient Education  2024 [employer].

## 2024-03-04 NOTE — Progress Notes (Signed)
 At approximately 1100, there was a loud thump heard from patients room. This RN along with Consulting civil engineer and two other nurse responded to sound and checked on patient. Upon walking in room and assessing the situation the patient and family were found on the floor from unsupervised fall. Patient was sitting down in corner of room with wife on top of patient. Upon further assessment there were skin tears noticed in 5 different areas on patients skin. Three skin tears on Left upper extremity, one on Right wrist below radial site and one on Right lower leg. Patients wife was helped up and assessed for any damage but confirmed to be okay. Patient states hitting head against the wall on the downfall but no bleeding or hematoma present on assessment. Skin tears were assessed further, cleaned and dressed with wet to dry dressing and coban. Patient was then assisted off the floor and into chair. Vitals were taken (157/95 bp) and Cooper,MD was paged and informed of situation. Patient still approved for discharge with further instructions for skin tear management at home.

## 2024-03-04 NOTE — Interval H&P Note (Signed)
 History and Physical Interval Note:  03/04/2024 8:08 AM  [name]  has presented today for surgery, with the diagnosis of calcificaiton.  The various methods of treatment have been discussed with the patient and family. After consideration of risks, benefits and other options for treatment, the patient has consented to  Procedure(s): LEFT HEART CATH AND CORONARY ANGIOGRAPHY (N/A) as a surgical intervention.  The patient's history has been reviewed, patient examined, no change in status, stable for surgery.  I have reviewed the patient's chart and labs.  Questions were answered to the patient's satisfaction.     Arnoldo Lapping

## 2024-03-05 ENCOUNTER — Telehealth: Payer: Self-pay

## 2024-03-05 NOTE — Telephone Encounter (Signed)
 Spoke with the patient at length.  He understood that generator change and upgrade will be prioritized at this time and Watchman will be done at a later date.  Will route to Dr. Arlester Ladd and his nurse for gen change/upgrade planning and follow-up.

## 2024-03-05 NOTE — Progress Notes (Unsigned)
      Subjective:    Patient ID: [name], male    DOB: [birth year], 78 y.o.   MRN: [id number]     HPI [name] is here for follow up   He fell and is here to have his bandages replaced and wounds evaluated.   Medications and allergies reviewed with patient and updated if appropriate.  Current Outpatient Medications on File Prior to Visit  Medication Sig Dispense Refill   albuterol  (VENTOLIN  HFA) 108 (90 Base) MCG/ACT inhaler INHALE 1-2 PUFFS BY MOUTH INTO THE LUNGS EVERY 6 HOURS AS NEEDED. 8.5 each 2   allopurinol  (ZYLOPRIM ) 300 MG tablet TAKE 1/2 TABLET BY MOUTH DAILY 45 tablet 1   amLODipine  (NORVASC ) 5 MG tablet Take 1 tablet (5 mg total) by mouth daily. 30 tablet 2   apixaban  (ELIQUIS ) 5 MG TABS tablet TAKE 1 TABLET BY MOUTH TWICE A DAY 180 tablet 1   benzonatate  (TESSALON ) 200 MG capsule TAKE 1 CAPSULE BY MOUTH THREE TIMES A DAY AS NEEDED 45 capsule 1   famotidine  (PEPCID ) 20 MG tablet TAKE 1 TABLET BY MOUT AFTER SUPPER 90 tablet 3   gabapentin  (NEURONTIN ) 300 MG capsule Take 1 capsule (300 mg total) by mouth 3 (three) times daily. (Patient taking differently: Take 300 mg by mouth 2 (two) times daily.) 270 capsule 2   pantoprazole  (PROTONIX ) 40 MG tablet TAKE 1 TABLET (40 MG TOTAL) BY MOUTH DAILY. TAKE 30-60 MIN BEFORE FIRST MEAL OF THE DAY 90 tablet 3   sildenafil  (REVATIO ) 20 MG tablet TAKE 3 TO 5 TABLETS BY MOUTH AS NEEDED 100 tablet 3   simvastatin  (ZOCOR ) 80 MG tablet Take 1 tablet (80 mg total) by mouth daily. 90 tablet 1   valsartan  (DIOVAN ) 40 MG tablet Take 40 mg by mouth daily.     No current facility-administered medications on file prior to visit.     Review of Systems     Objective:  There were no vitals filed for this visit. BP Readings from Last 3 Encounters:  03/04/24 (!) 157/95  03/03/24 (!) 134/90  02/14/24 126/70   Wt Readings from Last 3 Encounters:  03/04/24 235 lb (106.6 kg)  03/03/24 235 lb (106.6 kg)  02/14/24 235 lb (106.6 kg)    There is no height or weight on file to calculate BMI.    Physical Exam     Lab Results  Component Value Date   WBC 5.8 02/14/2024   HGB 15.5 02/14/2024   HCT 45.1 02/14/2024   PLT 196.0 02/14/2024   GLUCOSE 108 (H) 02/14/2024   CHOL 168 02/14/2024   TRIG 115.0 02/14/2024   HDL 54.80 02/14/2024   LDLDIRECT 109.1 08/13/2008   LDLCALC 90 02/14/2024   ALT 36 02/14/2024   AST 39 (H) 02/14/2024   NA 139 02/14/2024   K 3.8 02/14/2024   CL 102 02/14/2024   CREATININE 1.15 02/14/2024   BUN 11 02/14/2024   CO2 27 02/14/2024   TSH 3.78 02/02/2021   PSA 2.77 03/14/2023   INR 1.02 01/13/2011   HGBA1C 5.5 02/14/2024   MICROALBUR 14.0 (H) 02/14/2024     Assessment & Plan:    See Problem List for Assessment and Plan of chronic medical problems.

## 2024-03-05 NOTE — Telephone Encounter (Signed)
-----   Message from Ardeen Kohler sent at 03/04/2024  8:49 PM EDT ----- Agreed. Would prioritize gen change and upgrade. Watchman can be done at later date. ----- Message ----- From: Efraim Grange, MD Sent: 03/04/2024   3:56 PM EDT To: Allison Ivory, RN; Ardeen Kohler, MD; #  Josh, let me know your thoughts. His generator is about a month from ERI, at which time the Medtronic device will go asynchronous.   I think we should prioritize the generator change and an upgrade to CRT since he has this EF drop with a high burden of V pacing. ----- Message ----- From: Allison Ivory, RN Sent: 03/04/2024  12:54 PM EDT To: Efraim Grange, MD; Ardeen Kohler, MD  Hey guys, During Mr. Arnall LAAO work up he was found to have EF= 30% and elevated CAC on cCT. Here are the results of his cath today. Please let me know how to proceed as it looks like LAAO and gen change/CRT upgrade are on the table from your notes. Thank you! Acie Holiday

## 2024-03-06 ENCOUNTER — Ambulatory Visit (INDEPENDENT_AMBULATORY_CARE_PROVIDER_SITE_OTHER): Admitting: Internal Medicine

## 2024-03-06 ENCOUNTER — Encounter: Payer: Self-pay | Admitting: Internal Medicine

## 2024-03-06 VITALS — BP 132/80 | HR 68 | Temp 98.0°F | Ht 77.0 in

## 2024-03-06 DIAGNOSIS — T148XXA Other injury of unspecified body region, initial encounter: Secondary | ICD-10-CM

## 2024-03-06 MED ORDER — MUPIROCIN 2 % EX OINT: 1.0000 | TOPICAL_OINTMENT | Freq: Every day | CUTANEOUS | 1 refills | Status: DC

## 2024-03-06 NOTE — Patient Instructions (Signed)
     Change your bandages daily - use nonstick pad and anti-bacterial ointment.     If there is any evidence of infection let us  know.

## 2024-03-06 NOTE — Assessment & Plan Note (Signed)
 Acute Secondary to fall 2 days ago.  No other injuries Bandages removed No evidence of infection, mild bleeding that did stop when the bandage was placed Wounds bandaged with nonstick pads with antibacterial ointment on them and wrapped with Coban He will change his bandages once a day and monitor closely for infection Bactroban  ointment sent to pharmacy-advised to use this for the wounds

## 2024-03-09 ENCOUNTER — Other Ambulatory Visit: Payer: Self-pay | Admitting: Internal Medicine

## 2024-03-13 ENCOUNTER — Encounter: Payer: Self-pay | Admitting: Cardiovascular Disease

## 2024-03-13 ENCOUNTER — Ambulatory Visit

## 2024-03-13 ENCOUNTER — Ambulatory Visit: Payer: PPO | Admitting: Family Medicine

## 2024-03-13 ENCOUNTER — Encounter: Payer: Self-pay | Admitting: Family Medicine

## 2024-03-13 ENCOUNTER — Other Ambulatory Visit: Payer: Self-pay

## 2024-03-13 VITALS — BP 118/80 | HR 66 | Ht 77.0 in | Wt 239.0 lb

## 2024-03-13 DIAGNOSIS — M533 Sacrococcygeal disorders, not elsewhere classified: Secondary | ICD-10-CM

## 2024-03-13 DIAGNOSIS — G8929 Other chronic pain: Secondary | ICD-10-CM

## 2024-03-13 DIAGNOSIS — M545 Low back pain, unspecified: Secondary | ICD-10-CM | POA: Diagnosis not present

## 2024-03-13 DIAGNOSIS — M19012 Primary osteoarthritis, left shoulder: Secondary | ICD-10-CM | POA: Diagnosis not present

## 2024-03-13 DIAGNOSIS — I442 Atrioventricular block, complete: Secondary | ICD-10-CM

## 2024-03-13 DIAGNOSIS — M778 Other enthesopathies, not elsewhere classified: Secondary | ICD-10-CM | POA: Diagnosis not present

## 2024-03-13 DIAGNOSIS — M25512 Pain in left shoulder: Secondary | ICD-10-CM

## 2024-03-13 DIAGNOSIS — Z950 Presence of cardiac pacemaker: Secondary | ICD-10-CM | POA: Diagnosis not present

## 2024-03-13 NOTE — Patient Instructions (Addendum)
 Thank you for coming in today.   I've referred you to Physical Therapy.  Let us  know if you don't hear from them in one week.   You received an injection today. Seek immediate medical attention if the joint becomes red, extremely painful, or is oozing fluid.   Please get an Xray today before you leave   Check back in 3 months

## 2024-03-13 NOTE — Progress Notes (Signed)
 I, Miquel Amen, CMA acting as a Neurosurgeon for Garlan Juniper, MD.  [name] is a 78 y.o. male who presents to [facility] at [facility] today for lower back / SI joint pain. Pt was last seen by Dr. Alease Hunter on 12/06/2023, Had heart cath on 03/04/24. Notes falling while trying to get dressed after cardiac cath. Today, pt c/o pain in the left shoulder, back of the head. Would like to wounds looked at today. Has contusions on bilat arms and right lower leg. Denies LOC at time of injury, minimal head pain now. Would like referral back to PT for mobility, gait training, and tranfers.    Pertinent review of systems: no fever or chills  Relevant historical information: CAD, heart block with pacer.  Systolic heart failure.   Exam:  BP 118/80   Pulse 66   Ht 6\' 5"  (1.956 m)   Wt 239 lb (108.4 kg)   SpO2 97%   BMI 28.34 kg/m  General: Well Developed, well nourished, and in no acute distress.   MSK: L-spine nontender palpation midline.  Tender palpation paraspinal musculatures. Reduced lumbar motion.  Left shoulder normal.  Normal motion.  Intact strength.  Pain with abduction.  Skin: Multiple abrasions upper and lower extremities.  No granulation tissue is present within the abrasion.    Lab and Radiology Results  Procedure: Real-time Ultrasound Guided Injection of right SI joint Device: Philips Affiniti 50G/GE Logiq Images permanently stored and available for review in PACS Verbal informed consent obtained.  Discussed risks and benefits of procedure. Warned about infection, bleeding, hyperglycemia damage to structures among others. Patient expresses understanding and agreement Time-out conducted.   Noted no overlying erythema, induration, or other signs of local infection.   Skin prepped in a sterile fashion.   Local anesthesia: Topical Ethyl chloride.   With sterile technique and under real time ultrasound guidance: 40 mg of Kenalog  and 2 mL of Marcaine injected  into SI joint. Fluid seen entering the joint capsule.   Completed without difficulty   Pain immediately resolved suggesting accurate placement of the medication.   Advised to call if fevers/chills, erythema, induration, drainage, or persistent bleeding.   Images permanently stored and available for review in the ultrasound unit.  Impression: Technically successful ultrasound guided injection.    Procedure: Real-time Ultrasound Guided Injection of left SI joint Device: Philips Affiniti 50G/GE Logiq Images permanently stored and available for review in PACS Verbal informed consent obtained.  Discussed risks and benefits of procedure. Warned about infection, bleeding, hyperglycemia damage to structures among others. Patient expresses understanding and agreement Time-out conducted.   Noted no overlying erythema, induration, or other signs of local infection.   Skin prepped in a sterile fashion.   Local anesthesia: Topical Ethyl chloride.   With sterile technique and under real time ultrasound guidance: 40 mg of Kenalog  and 2 mL of Marcaine injected into joint capsule. Fluid seen entering the joint.   Completed without difficulty   Pain immediately resolved suggesting accurate placement of the medication.   Advised to call if fevers/chills, erythema, induration, drainage, or persistent bleeding.   Images permanently stored and available for review in the ultrasound unit.  Impression: Technically successful ultrasound guided injection.    X-ray images left shoulder obtained today personally and independently interpreted. Mild glenohumeral DJD.  Moderate AC DJD.  No acute fractures. Await formal radiology review     Assessment and Plan: 78 y.o. male with chronic low back pain.  Patient has  had a recent exacerbation after his fall.  Plan for bilateral SI joint injections.  Additionally he has shoulder pain after his fall.  X-ray today is reassuring.  Await formal radiology overread.   Will refer to formal physical therapy for his shoulder pain as well as his back and knee pain after this fall.  Multiple skin abrasions after fall doing well under the care of his PCP.  Dressing changed again today.  PDMP not reviewed this encounter. Orders Placed This Encounter  Procedures   US  LIMITED JOINT SPACE STRUCTURES LOW BILAT(NO LINKED CHARGES)    Reason for Exam (SYMPTOM  OR DIAGNOSIS REQUIRED):   lower back / SI joint pain    Preferred imaging location?:   [facility] Shoulder Left    Standing Status:   Future    Number of Occurrences:   1    Expiration Date:   03/13/2025    Reason for Exam (SYMPTOM  OR DIAGNOSIS REQUIRED):   eval left shoulder    Preferred imaging location?:   [facility]   Ambulatory referral to Physical Therapy    Referral Priority:   Routine    Referral Type:   Physical Medicine    Referral Reason:   Specialty Services Required    Requested Specialty:   Physical Therapy    Number of Visits Requested:   1   No orders of the defined types were placed in this encounter.    Discussed warning signs or symptoms. Please see discharge instructions. Patient expresses understanding.   The above documentation has been reviewed and is accurate and complete Garlan Juniper, M.D.

## 2024-03-14 NOTE — Telephone Encounter (Signed)
 Pt called to schedule PT here. First avail is 03/31/2024 with a wait list. Per pt, [facility] first avail is 1 month out.  Pt would like to be referred somewhere that he can get in earlier.

## 2024-03-14 NOTE — Telephone Encounter (Signed)
 Pt is scheduled for BiV-PPM Upgrade with Dr. Arlester Ladd on 6/5 at 2:30 PM.

## 2024-03-14 NOTE — Telephone Encounter (Signed)
 Spoke with patient, reviewed instruction letter. Labs to be completed on 04/03/24. Left surgical scrub at front desk for patient to pick up. No further needs at this time

## 2024-03-14 NOTE — Progress Notes (Signed)
 Left shoulder x-ray shows mild arthritis.  No broken bones are visible.

## 2024-03-20 ENCOUNTER — Other Ambulatory Visit: Payer: Self-pay | Admitting: Internal Medicine

## 2024-03-26 ENCOUNTER — Ambulatory Visit (INDEPENDENT_AMBULATORY_CARE_PROVIDER_SITE_OTHER)

## 2024-03-26 ENCOUNTER — Ambulatory Visit: Payer: PPO

## 2024-03-26 ENCOUNTER — Other Ambulatory Visit: Payer: Self-pay | Admitting: Cardiovascular Disease

## 2024-03-26 ENCOUNTER — Telehealth (HOSPITAL_COMMUNITY): Payer: Self-pay

## 2024-03-26 DIAGNOSIS — I442 Atrioventricular block, complete: Secondary | ICD-10-CM

## 2024-03-26 DIAGNOSIS — I483 Typical atrial flutter: Secondary | ICD-10-CM

## 2024-03-26 LAB — CUP PACEART REMOTE DEVICE CHECK
Battery Impedance: 7635 Ohm
Battery Voltage: 2.63 V
Brady Statistic RV Percent Paced: 100 %
Date Time Interrogation Session: 20250514084932
Implantable Lead Connection Status: 753985
Implantable Lead Connection Status: 753985
Implantable Lead Implant Date: 20120302
Implantable Lead Implant Date: 20120302
Implantable Lead Location: 753859
Implantable Lead Location: 753860
Implantable Lead Model: 5076
Implantable Pulse Generator Implant Date: 20120302
Lead Channel Impedance Value: 642 Ohm
Lead Channel Impedance Value: 67 Ohm
Lead Channel Setting Pacing Amplitude: 2 V
Lead Channel Setting Pacing Pulse Width: 0.4 ms
Lead Channel Setting Sensing Sensitivity: 2.8 mV
Zone Setting Status: 755011
Zone Setting Status: 755011

## 2024-03-26 NOTE — Telephone Encounter (Signed)
 Attempted to reach patient to discuss upcoming procedure, no answer. Left VM for patient to return call.

## 2024-03-26 NOTE — Telephone Encounter (Signed)
 Prescription refill request for Eliquis  received. Indication:aflutter Last office visit:4/25 Scr:1.15  4/25 Age: 78 Weight:108.4  kg  Prescription refilled

## 2024-03-27 NOTE — Telephone Encounter (Addendum)
 Patient returned call to discuss upcoming procedure.     New medical conditions? No Recent hospitalizations or surgeries? No Started any new medications? No Patient made aware to contact office to inform of any new medications started. Any changes in activities of daily living? No  Pre-procedure testing scheduled: Lab work on 04/03/24.  Hold Eliquis  for 2 days prior to procedure - your last dose will be on June 2.    Confirmed patient is scheduled for PPM generator change on Thursday, June 5 with Dr. Marlane Silver. Instructed patient to arrive at the Main Entrance A at [facility]: [address] [place], [place] [address] and check in at Admitting at 12:30 PM.  Advised of plan to go home the same day and will only stay overnight if medically necessary. You MUST have a responsible adult to drive you home and MUST be with you the first 24 hours after you arrive home or your procedure could be cancelled.  Patient verbalized understanding to information provided and is agreeable to proceed with procedure.

## 2024-03-28 ENCOUNTER — Ambulatory Visit: Payer: Self-pay | Admitting: Cardiovascular Disease

## 2024-03-31 ENCOUNTER — Telehealth: Payer: Self-pay

## 2024-03-31 NOTE — Telephone Encounter (Signed)
 Called pt and moved his BiV PPM upgrade with Dr. Arlester Ladd up from 6/5 to 5/23...  He will come to have updated labs and pick up scrub on 5/21.Aaron AasAaron Aas

## 2024-04-01 ENCOUNTER — Telehealth: Payer: Self-pay | Admitting: Cardiovascular Disease

## 2024-04-01 NOTE — Telephone Encounter (Signed)
 Received a message from April Garrison that patient's procedure for BIV Pacemaker Generator changeout has been moved up to 5/23. Noted patient called [facility] with a question regarding procedure.   Returned call to patient who states after a discussion with his daughter who is a Engineer, civil (consulting) and a nurse from [employer], he inquires why he is not getting an ICD placed during procedure, especially with his EF 30%. I explained the function of the CRT-P device and explained what he is scheduled for is not a defibrillator. Per last office visit with Dr. Arlester Ladd, patient was to have a coronary angiogram and if no active coronary disease, will need to plan upgrade to CRT device. Patient requested Dr. Katheryne Pane feedback. Will send to provider to advise.

## 2024-04-01 NOTE — Telephone Encounter (Signed)
 Pt would like a c/b from nurse regarding upcoming procedure on Friday. Please advise

## 2024-04-02 ENCOUNTER — Ambulatory Visit: Admitting: Physical Therapy

## 2024-04-02 LAB — CBC
Hematocrit: 46.5 % (ref 37.5–51.0)
Hemoglobin: 15.6 g/dL (ref 13.0–17.7)
MCH: 35.3 pg — ABNORMAL HIGH (ref 26.6–33.0)
MCHC: 33.5 g/dL (ref 31.5–35.7)
MCV: 105 fL — ABNORMAL HIGH (ref 79–97)
Platelets: 161 10*3/uL (ref 150–450)
RBC: 4.42 x10E6/uL (ref 4.14–5.80)
RDW: 12.9 % (ref 11.6–15.4)
WBC: 7.6 10*3/uL (ref 3.4–10.8)

## 2024-04-02 NOTE — Telephone Encounter (Signed)
 Spoke with patient and informed him of provider's feedback. He verbalized understanding and would like to proceed with the currently scheduled procedure. Patient additionally requested nurse to send Dr. Katheryne Pane response to his MyChart, so he could review as well.

## 2024-04-03 LAB — BASIC METABOLIC PANEL WITH GFR
BUN/Creatinine Ratio: 19 (ref 10–24)
BUN: 25 mg/dL (ref 8–27)
CO2: 20 mmol/L (ref 20–29)
Calcium: 10.4 mg/dL — ABNORMAL HIGH (ref 8.6–10.2)
Chloride: 101 mmol/L (ref 96–106)
Creatinine, Ser: 1.29 mg/dL — ABNORMAL HIGH (ref 0.76–1.27)
Glucose: 87 mg/dL (ref 70–99)
Potassium: 4.7 mmol/L (ref 3.5–5.2)
Sodium: 139 mmol/L (ref 134–144)
eGFR: 57 mL/min/{1.73_m2} — ABNORMAL LOW (ref >59)

## 2024-04-03 NOTE — Telephone Encounter (Signed)
 Patient reports he has had a chronic dry cough for years, but noticed it has increased in frequency recently. He denies any additional symptoms. He is hoping it will not interfere with procedure on tomorrow and inquired if mild sedation will help control. Dr. Arlester Ladd made aware.

## 2024-04-04 ENCOUNTER — Ambulatory Visit (HOSPITAL_COMMUNITY)

## 2024-04-04 ENCOUNTER — Encounter (HOSPITAL_COMMUNITY)
Admission: RE | Disposition: A | Discharge status: Home / Self Care | Payer: Self-pay | Source: Home / Self Care | Attending: Cardiovascular Disease

## 2024-04-04 ENCOUNTER — Ambulatory Visit (HOSPITAL_COMMUNITY)
Admission: RE | Admit: 2024-04-04 | Discharge: 2024-04-04 | Disposition: A | Discharge status: Home / Self Care | Attending: Cardiovascular Disease | Admitting: Cardiovascular Disease

## 2024-04-04 ENCOUNTER — Other Ambulatory Visit: Payer: Self-pay

## 2024-04-04 DIAGNOSIS — I5030 Unspecified diastolic (congestive) heart failure: Secondary | ICD-10-CM | POA: Diagnosis not present

## 2024-04-04 DIAGNOSIS — I483 Typical atrial flutter: Secondary | ICD-10-CM

## 2024-04-04 DIAGNOSIS — I442 Atrioventricular block, complete: Secondary | ICD-10-CM

## 2024-04-04 DIAGNOSIS — Z950 Presence of cardiac pacemaker: Secondary | ICD-10-CM

## 2024-04-04 DIAGNOSIS — I429 Cardiomyopathy, unspecified: Secondary | ICD-10-CM

## 2024-04-04 DIAGNOSIS — Z45010 Encounter for checking and testing of cardiac pacemaker pulse generator [battery]: Secondary | ICD-10-CM | POA: Diagnosis not present

## 2024-04-04 DIAGNOSIS — I5020 Unspecified systolic (congestive) heart failure: Secondary | ICD-10-CM | POA: Diagnosis not present

## 2024-04-04 DIAGNOSIS — D6869 Other thrombophilia: Secondary | ICD-10-CM | POA: Diagnosis not present

## 2024-04-04 HISTORY — PX: PPM GENERATOR CHANGEOUT: EP1233

## 2024-04-04 HISTORY — PX: BIV UPGRADE: EP1202

## 2024-04-04 HISTORY — DX: Presence of cardiac pacemaker: Z95.0

## 2024-04-04 SURGERY — BIV UPGRADE
Anesthesia: LOCAL

## 2024-04-04 MED ORDER — SODIUM CHLORIDE 0.9 % IV SOLN
80.0000 mg | INTRAVENOUS | Status: AC
  Administered 2024-04-04: 80 mg

## 2024-04-04 MED ORDER — MIDAZOLAM HCL 2 MG/2ML IJ SOLN
INTRAMUSCULAR | Status: AC
  Filled 2024-04-04: qty 2

## 2024-04-04 MED ORDER — SODIUM CHLORIDE 0.9 % IV SOLN
INTRAVENOUS | Status: AC
  Filled 2024-04-04: qty 2

## 2024-04-04 MED ORDER — LIDOCAINE HCL (PF) 1 % IJ SOLN
INTRAMUSCULAR | Status: DC | PRN
  Administered 2024-04-04: 60 mL

## 2024-04-04 MED ORDER — LIDOCAINE HCL 1 % IJ SOLN
INTRAMUSCULAR | Status: AC
  Filled 2024-04-04: qty 60

## 2024-04-04 MED ORDER — POVIDONE-IODINE 10 % EX SWAB
2.0000 | Freq: Once | CUTANEOUS | Status: AC
  Administered 2024-04-04: 2 via TOPICAL

## 2024-04-04 MED ORDER — CEFAZOLIN SODIUM-DEXTROSE 2-4 GM/100ML-% IV SOLN
2.0000 g | INTRAVENOUS | Status: AC
  Administered 2024-04-04: 2 g via INTRAVENOUS

## 2024-04-04 MED ORDER — FENTANYL CITRATE (PF) 100 MCG/2ML IJ SOLN
INTRAMUSCULAR | Status: AC
  Filled 2024-04-04: qty 2

## 2024-04-04 MED ORDER — HEPARIN (PORCINE) IN NACL 1000-0.9 UT/500ML-% IV SOLN
INTRAVENOUS | Status: DC | PRN
  Administered 2024-04-04: 500 mL

## 2024-04-04 MED ORDER — FENTANYL CITRATE (PF) 100 MCG/2ML IJ SOLN
INTRAMUSCULAR | Status: DC | PRN
  Administered 2024-04-04: 25 ug via INTRAVENOUS
  Administered 2024-04-04: 12.5 ug via INTRAVENOUS

## 2024-04-04 MED ORDER — APIXABAN 5 MG PO TABS: 5.0000 mg | ORAL_TABLET | Freq: Two times a day (BID) | ORAL | Status: DC

## 2024-04-04 MED ORDER — ACETAMINOPHEN 325 MG PO TABS: 325.0000 mg | ORAL_TABLET | ORAL | Status: DC | PRN

## 2024-04-04 MED ORDER — SODIUM CHLORIDE 0.9 % IV SOLN: INTRAVENOUS | Status: DC

## 2024-04-04 MED ORDER — CEFAZOLIN SODIUM-DEXTROSE 2-4 GM/100ML-% IV SOLN
INTRAVENOUS | Status: DC
Start: 2024-04-04 — End: 2024-04-04
  Filled 2024-04-04: qty 100

## 2024-04-04 MED ORDER — MIDAZOLAM HCL 5 MG/5ML IJ SOLN
INTRAMUSCULAR | Status: DC | PRN
  Administered 2024-04-04 (×2): 1 mg via INTRAVENOUS

## 2024-04-04 MED ORDER — ONDANSETRON HCL 4 MG/2ML IJ SOLN: 4.0000 mg | Freq: Four times a day (QID) | INTRAMUSCULAR | Status: DC | PRN

## 2024-04-04 MED ORDER — IOHEXOL 350 MG/ML SOLN
INTRAVENOUS | Status: DC | PRN
Start: 2024-04-04 — End: 2024-04-04
  Administered 2024-04-04: 15 mL

## 2024-04-04 SURGICAL SUPPLY — 16 items
BALLOON ATTAIN 80 (BALLOONS) IMPLANT
CABLE SURGICAL S-101-97-12 (CABLE) ×1 IMPLANT
CATH ATTAIN COM SURV 6250V-EH (CATHETERS) IMPLANT
CATH ATTAIN COM SURV 6250V-MB2 (CATHETERS) IMPLANT
DEVICE CRTP PERCEPTA QUAD MRI (Pacemaker) IMPLANT
DEVICE DISSECT PLASMABLAD 3.0S (MISCELLANEOUS) IMPLANT
KIT ESSENTIALS PG (KITS) IMPLANT
LEAD ATTAIN PERFORMA S 4598-88 (Lead) IMPLANT
PAD DEFIB RADIO PHYSIO CONN (PAD) ×1 IMPLANT
SHEATH 9.5FR PRELUDE SNAP 13 (SHEATH) IMPLANT
SLITTER 6232ADJ (MISCELLANEOUS) IMPLANT
TRAY PACEMAKER INSERTION (PACKS) ×1 IMPLANT
WIRE ACUITY WHISPER EDS 4648 (WIRE) IMPLANT
WIRE HI TORQ VERSACORE-J 145CM (WIRE) IMPLANT
WIRE MICRO SET SILHO 5FR 7 (SHEATH) IMPLANT
WIRE MICROINTRODUCER 60CM (WIRE) IMPLANT

## 2024-04-04 NOTE — H&P (Signed)
     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  Since last being seen in our clinic, the patient reports doing very well.  Today, he denies symptoms of palpitations, chest pain, shortness of breath,  lower extremity edema, dizziness, presyncope, or syncope.  The patient is otherwise without complaint today.   He has had sustained atrial high rate episodes detected. The atrial rate was about 250 bpm when EGM's are present. Often, only marker channel data is available.V rates are controlled, pt is V-paced.    He has had multiple episodes of severe bleeding and is once had to call a cleaning company to assist with clean up after bleeding episode.  His skin is very thin and tears easily.  He was referred to Dr. Daneil Dunker to discuss watchman and thought to be a good candidate for left atrial appendage closure.  During evaluation, he was referred for an echocardiogram which showed an LV ejection fraction of 30%.  He is scheduled to undergo coronary angiogram.  he has no device related complaints -- no new tenderness, drainage, redness. No new diagnoses since I last saw the patient. He underwent coronary angiogram that did not show significant CAD.     Physical Exam: Vitals:   04/04/24 1036  BP: (!) 158/89  Pulse: 65  Resp: 18  Temp: 97.6 F (36.4 C)  TempSrc: Oral  SpO2: 97%  Weight: 108 kg  Height: 6\' 5"  (1.956 m)      GEN- The patient is well appearing, alert and oriented x 3 today.   Head- normocephalic, atraumatic Lungs- normal work of breathing Chest- pacemaker pocket is well healed Heart- Regular rate and rhythm, no murmurs, rubs or gallops, PMI not laterally displaced GI- soft, NT, ND, + BS Extremities- no clubbing, cyanosis, or edema  Pacemaker interrogation- reviewed in detail today,  See PACEART report  ekg tracing ordered today is personally reviewed and shows AV  paced  Assessment and Plan:  Symptomatic complete heart block Normal pacemaker function See Pace Art report High burden of V pacing Battery is approaching ERI. He has an adapta.  I anticipate he will need upgrade to CRT at the time of generator change -- however, if he has significant CAD that is revascularized, and his battery life extends > 3 months, we would re-evaluate his EF.  Atrial high rate episodes --  Noted on PPM -- completely asymptomatic, V-rate controlled. The few actual EGMs recorded appear most consistent with flutter. Atrial rates approximately 250 bpm.  Will continue to monitor through device. No changes  CHF with reduced ejection fraction EF 30% March 2025 He is scheduled for coronary angiogram If no active coronary disease, will need to plan upgrade to CRT device  Secondary hypercoagulable state He has been having profuse bleeding Undergoing evaluation for LAA occlusion with Dr. Beckie Bow, MD  04/04/2024 11:44 AM

## 2024-04-04 NOTE — Discharge Instructions (Signed)
 After Your Pacemaker   You have a Medtronic Pacemaker  ACTIVITY Do not lift your arm above shoulder height for 1 week after your procedure. After 7 days, you may progress as below.  You should remove your sling 24 hours after your procedure, unless otherwise instructed by your provider.     Friday Apr 11, 2024  Saturday Apr 12, 2024 Sunday April 13, 2024 Monday April 14, 2024   Do not lift, push, pull, or carry anything over 10 pounds with the affected arm until 6 weeks (Friday May 16, 2024 ) after your procedure.   You may drive AFTER your wound check, unless you have been told otherwise by your provider.   Ask your healthcare provider when you can go back to work   INCISION/Dressing If you are on a blood thinner such as Coumadin, Xarelto, Eliquis , Plavix, or Pradaxa please confirm with your provider when this should be resumed.   If large square, outer bandage is left in place, this can be removed after 24 hours from your procedure. Do not remove steri-strips or glue as below.   If a PRESSURE DRESSING (a bulky dressing that usually goes up over your shoulder) was applied or left in place, please follow instructions given by your provider on when to return to have this removed.   Monitor your Pacemaker site for redness, swelling, and drainage. Call the device clinic at [phone] if you experience these symptoms or fever/chills.  If your incision is sealed with Steri-strips or staples, you may shower 7 days after your procedure or when told by your provider. Do not remove the steri-strips or let the shower hit directly on your site. You may wash around your site with soap and water.    If you were discharged in a sling, please do not wear this during the day more than 48 hours after your surgery unless otherwise instructed. This may increase the risk of stiffness and soreness in your shoulder.   Avoid lotions, ointments, or perfumes over your incision until it is well-healed.  You  may use a hot tub or a pool AFTER your wound check appointment if the incision is completely closed.  Pacemaker Alerts:  Some alerts are vibratory and others beep. These are NOT emergencies. Please call our office to let us  know. If this occurs at night or on weekends, it can wait until the next business day. Send a remote transmission.  If your device is capable of reading fluid status (for heart failure), you will be offered monthly monitoring to review this with you.   DEVICE MANAGEMENT Remote monitoring is used to monitor your pacemaker from home. This monitoring is scheduled every 91 days by our office. It allows us  to keep an eye on the functioning of your device to ensure it is working properly. You will routinely see your Electrophysiologist annually (more often if necessary).   You should receive your ID card for your new device in 4-8 weeks. Keep this card with you at all times once received. Consider wearing a medical alert bracelet or necklace.  Your Pacemaker may be MRI compatible. This will be discussed at your next office visit/wound check.  You should avoid contact with strong electric or magnetic fields.   Do not use amateur (ham) radio equipment or electric (arc) welding torches. MP3 player headphones with magnets should not be used. Some devices are safe to use if held at least 12 inches (30 cm) from your Pacemaker. These include power tools, lawn  mowers, and speakers. If you are unsure if something is safe to use, ask your health care provider.  When using your cell phone, hold it to the ear that is on the opposite side from the Pacemaker. Do not leave your cell phone in a pocket over the Pacemaker.  You may safely use electric blankets, heating pads, computers, and microwave ovens.  Call the office right away if: You have chest pain. You feel more short of breath than you have felt before. You feel more light-headed than you have felt before. Your incision starts to open  up.  This information is not intended to replace advice given to you by your health care provider. Make sure you discuss any questions you have with your health care provider.

## 2024-04-08 ENCOUNTER — Encounter (HOSPITAL_COMMUNITY): Payer: Self-pay | Admitting: Cardiovascular Disease

## 2024-04-09 MED FILL — Midazolam HCl Inj 2 MG/2ML (Base Equivalent): INTRAMUSCULAR | Qty: 2 | Status: AC

## 2024-04-10 ENCOUNTER — Encounter: Payer: Self-pay | Admitting: Emergency Medicine

## 2024-04-10 NOTE — Addendum Note (Signed)
 Addended by: Edra Govern D on: 04/10/2024 11:56 AM   Modules accepted: Orders, Level of Service

## 2024-04-10 NOTE — Progress Notes (Signed)
 Remote pacemaker transmission.

## 2024-04-17 ENCOUNTER — Ambulatory Visit (HOSPITAL_COMMUNITY): Admit: 2024-04-17 | Admitting: Cardiovascular Disease

## 2024-04-17 ENCOUNTER — Ambulatory Visit: Attending: Cardiology

## 2024-04-17 ENCOUNTER — Ambulatory Visit

## 2024-04-17 ENCOUNTER — Encounter (HOSPITAL_COMMUNITY)

## 2024-04-17 DIAGNOSIS — I442 Atrioventricular block, complete: Secondary | ICD-10-CM | POA: Diagnosis not present

## 2024-04-17 SURGERY — BIV PACEMAKER GENERATOR CHANGEOUT
Anesthesia: LOCAL

## 2024-04-17 NOTE — Progress Notes (Signed)
 Normal CRT-P chamber pacemaker wound check. Presenting rhythm: AS/BVP 85. Biv-P >99% . Wound well healed. Routine testing performed. Thresholds, sensing, and impedances consistent with implant measurements and at 3.5V safety margin/auto capture until 3 month visit. Brief NSVT episode <20 beats. AT/AF burden 7.1%, OAC on MAR. Reviewed arm restrictions to continue for 6 weeks total post op.  Pt enrolled in remote follow-up.

## 2024-04-17 NOTE — Patient Instructions (Addendum)

## 2024-04-21 ENCOUNTER — Telehealth: Payer: Self-pay

## 2024-04-21 ENCOUNTER — Other Ambulatory Visit: Payer: Self-pay

## 2024-04-21 DIAGNOSIS — D6869 Other thrombophilia: Secondary | ICD-10-CM

## 2024-04-21 DIAGNOSIS — I483 Typical atrial flutter: Secondary | ICD-10-CM

## 2024-04-21 DIAGNOSIS — I5020 Unspecified systolic (congestive) heart failure: Secondary | ICD-10-CM

## 2024-04-21 NOTE — Telephone Encounter (Signed)
 Discussed plan with patient.  He will have limited echo on 8/27 after he sees Dr. Arlester Ladd. If all goes well at those visits, he will see Dr. Daneil Dunker 9/9 in preparation for LAAO 9/18. He was grateful for call and agreed with plan.

## 2024-04-21 NOTE — Telephone Encounter (Signed)
-----   Message from Ardeen Kohler sent at 04/18/2024 10:42 AM EDT ----- Regarding: RE: watchman planning I think that if he is doing well at his visit with Dr. Arlester Ladd then we can proceed with scheduling Watchman in short order. We can also get an updated limited echo at that time to show that EF has improved with Dr. Katheryne Pane strong work. I am happy to see 1-2 weeks prior to implant.   Josh ----- Message ----- From: Efraim Grange, MD Sent: 04/17/2024  12:25 PM EDT To: Allison Ivory, RN; Ardeen Kohler, MD Subject: RE: watchman planning                          I'll see him at that time for the post device follow-up. I'll defer to Dr. Daneil Dunker the timing of LAAO and whether he wants to see the patient in clinic prior to rescheduling the implant procedure. ----- Message ----- From: Allison Ivory, RN Sent: 04/17/2024  11:10 AM EDT To: Efraim Grange, MD; Ardeen Kohler, MD Subject: watchman planning                              Hey guys,  Dr. Daneil Dunker saw this patient a couple months ago for LAAO eval. Work up showed elevated CAC and low EF, so Watchman was put on the back burner while the patient had a cath then BiV upgrade with Dr. Arlester Ladd 5/23. #Historically,# we have waited 2ish months post implants for LAAO. Do you think its reasonable to have the patient see Dr. Arlester Ladd 8/27 and if he is doing well at that time, proceed with LAAO from that visit?  Thanks for helping me plan,  Acie Holiday

## 2024-04-21 NOTE — Telephone Encounter (Signed)
 [name]: Pectinated appendage Max 31/ AVG 25/ Depth 19.6 Likely use a 31 mm device  Inf/Mid TSP RAO 20 CAU 13

## 2024-05-04 NOTE — Progress Notes (Unsigned)
    Subjective:    Patient ID: [name], male    DOB: Jan 28, 1946, 78 y.o.   MRN: [id number]      HPI [name] is here for No chief complaint on file.    Swelling on right leg -     Medications and allergies reviewed with patient and updated if appropriate.  Current Outpatient Medications on File Prior to Visit  Medication Sig Dispense Refill   albuterol  (VENTOLIN  HFA) 108 (90 Base) MCG/ACT inhaler INHALE 1-2 PUFFS BY MOUTH INTO THE LUNGS EVERY 6 HOURS AS NEEDED. 8.5 each 2   allopurinol  (ZYLOPRIM ) 300 MG tablet TAKE 1/2 TABLET BY MOUTH DAILY 45 tablet 1   amLODipine  (NORVASC ) 5 MG tablet TAKE 1 TABLET (5 MG TOTAL) BY MOUTH DAILY. 90 tablet 1   apixaban  (ELIQUIS ) 5 MG TABS tablet Take 1 tablet (5 mg total) by mouth 2 (two) times daily.     benzonatate  (TESSALON ) 200 MG capsule TAKE 1 CAPSULE BY MOUTH THREE TIMES A DAY AS NEEDED 45 capsule 1   famotidine  (PEPCID ) 20 MG tablet TAKE 1 TABLET BY MOUT AFTER SUPPER 90 tablet 3   gabapentin  (NEURONTIN ) 300 MG capsule Take 1 capsule (300 mg total) by mouth 3 (three) times daily. (Patient taking differently: Take 300 mg by mouth 2 (two) times daily.) 270 capsule 2   mupirocin  ointment (BACTROBAN ) 2 % Apply 1 Application topically daily. 30 g 1   pantoprazole  (PROTONIX ) 40 MG tablet TAKE 1 TABLET (40 MG TOTAL) BY MOUTH DAILY. TAKE 30-60 MIN BEFORE FIRST MEAL OF THE DAY 90 tablet 3   sildenafil  (REVATIO ) 20 MG tablet TAKE 3 TO 5 TABLETS BY MOUTH AS NEEDED 100 tablet 3   simvastatin  (ZOCOR ) 80 MG tablet Take 1 tablet (80 mg total) by mouth daily. 90 tablet 1   valsartan  (DIOVAN ) 40 MG tablet Take 40 mg by mouth daily.     No current facility-administered medications on file prior to visit.    Review of Systems     Objective:  There were no vitals filed for this visit. BP Readings from Last 3 Encounters:  04/04/24 (!) 150/84  03/13/24 118/80  03/06/24 132/80   Wt Readings from Last 3 Encounters:  04/04/24 238 lb (108 kg)   03/13/24 239 lb (108.4 kg)  03/04/24 235 lb (106.6 kg)   There is no height or weight on file to calculate BMI.    Physical Exam         Assessment & Plan:    See Problem List for Assessment and Plan of chronic medical problems.

## 2024-05-05 ENCOUNTER — Ambulatory Visit (INDEPENDENT_AMBULATORY_CARE_PROVIDER_SITE_OTHER): Admitting: Internal Medicine

## 2024-05-05 ENCOUNTER — Encounter: Payer: Self-pay | Admitting: Internal Medicine

## 2024-05-05 VITALS — BP 130/80 | HR 78 | Temp 98.0°F | Ht 77.0 in | Wt 236.0 lb

## 2024-05-05 DIAGNOSIS — I10 Essential (primary) hypertension: Secondary | ICD-10-CM | POA: Diagnosis not present

## 2024-05-05 DIAGNOSIS — M79661 Pain in right lower leg: Secondary | ICD-10-CM | POA: Diagnosis not present

## 2024-05-05 DIAGNOSIS — R053 Chronic cough: Secondary | ICD-10-CM | POA: Diagnosis not present

## 2024-05-05 MED ORDER — FLUTICASONE-SALMETEROL 100-50 MCG/ACT IN AEPB: 1.0000 | INHALATION_SPRAY | Freq: Two times a day (BID) | RESPIRATORY_TRACT | 5 refills | Status: DC

## 2024-05-05 MED ORDER — MOMETASONE FUROATE 0.1 % EX SOLN: Freq: Every day | CUTANEOUS | 5 refills | Status: AC

## 2024-05-05 NOTE — Assessment & Plan Note (Signed)
 Chronic Blood pressure controlled Continue amlodipine  5 mg daily, valsartan  40 mg daily

## 2024-05-05 NOTE — Assessment & Plan Note (Addendum)
 Chronic Has tried his wife's wixela and it helped -- will rx Start wixela 100/50 mcg/act Discussed that we can increase the dose if needed to help better with the cough Continue albuterol  as needed

## 2024-05-05 NOTE — Patient Instructions (Addendum)
        Medications changes include :   wixela inhaler, steroid solution        Return if symptoms worsen or fail to improve.

## 2024-05-05 NOTE — Assessment & Plan Note (Signed)
 Acute Having pain in the right lower leg-mid lateral leg. There is a varicose vein there but there is no palpable thrombosis, overlying erythema or swelling/induration/fluctuance He does get a pins and needle sensation mostly when he first stands up ?  Radiculopathy ?  Pain from varicose vein-doubt superficial thrombosis-not clinically evident on exam and is on Eliquis  Discussed further evaluation with ultrasound or seeing vascular-he would like to just monitor for now and he will update me if his symptoms change or worsen

## 2024-05-06 NOTE — Progress Notes (Signed)
 Remote pacemaker transmission.

## 2024-05-07 ENCOUNTER — Encounter: Payer: Self-pay | Admitting: Physical Therapy

## 2024-05-07 ENCOUNTER — Ambulatory Visit: Admitting: Physical Therapy

## 2024-05-07 ENCOUNTER — Other Ambulatory Visit: Payer: Self-pay

## 2024-05-07 DIAGNOSIS — M5459 Other low back pain: Secondary | ICD-10-CM

## 2024-05-07 DIAGNOSIS — M6281 Muscle weakness (generalized): Secondary | ICD-10-CM

## 2024-05-07 NOTE — Patient Instructions (Signed)
 Access Code: [email or web address] URL: [email or web address] Date: 05/07/2024 Prepared by: Elaine Daring  Exercises - Hooklying Single Knee to Chest Stretch  - 1 x daily - 3 reps - 20 seconds hold - Supine Piriformis Stretch with Foot on Ground  - 1 x daily - 3 reps - 20 seconds hold - Supine Lower Trunk Rotation  - 1 x daily - 10 reps - 5 seconds  hold - Bridge  - 1 x daily - 2 sets - 10 reps - 5 seconds hold - Hooklying Clamshell with Resistance  - 1 x daily - 2 sets - 15 reps - Seated Hamstring Stretch  - 1 x daily - 3 reps - 20 seconds hold

## 2024-05-07 NOTE — Therapy (Signed)
 OUTPATIENT PHYSICAL THERAPY EVALUATION   Patient Name: [name] MRN: [id number] DOB:1946-03-26, 78 y.o., male Today's Date: 05/07/2024   END OF SESSION:  PT End of Session - 05/07/24 1201     Visit Number 1    Number of Visits 9    Date for PT Re-Evaluation 07/02/24    Authorization Type [employer]    PT Start Time 1145    PT Stop Time 1230    PT Time Calculation (min) 45 min    Activity Tolerance Patient tolerated treatment well    Behavior During Therapy WFL for tasks assessed/performed          Past Medical History:  Diagnosis Date   Complete heart block (HCC)    S/P  PPM by Dr Kelsie   Coronary artery disease    Non-obstructive. Pt had left heart catheterization in August 2009 showing a 30% mid-circumflex lesion, otherwise coronaries were clean angiographically   Hyperlipidemia    Hypertension    OSA (obstructive sleep apnea)    Probable; study never scheduled   Pacemaker 04/04/2024   Past Surgical History:  Procedure Laterality Date   BIV UPGRADE N/A 04/04/2024   Procedure: BIV UPGRADE;  Surgeon: Mealor, Eulas BRAVO, MD;  Location: [facility] IN[facility]ASIVE [facility];  Service: Cardiovascular;  Laterality: N/A;   CARDIAC CATHETERIZATION  1993, 2009   2009- negative   COLONOSCOPY  2011   negative, [facility]   LEFT HEART CATH AND CORONARY ANGIOGRAPHY N/A 03/04/2024   Procedure: LEFT HEART CATH AND CORONARY ANGIOGRAPHY;  Surgeon: Wonda Sharper, MD;  Location: [facility] INVASIVE CV LAB;  Service: Cardiovascular;  Laterality: N/A;   PACEMAKER INSERTION  01/13/11   MDT by MILUS for complete heart block   PPM GENERATOR CHANGEOUT N/A 04/04/2024   Procedure: PPM GENERATOR CHANGEOUT;  Surgeon: Nancey, Eulas BRAVO, MD;  Location: [facility] INVASIVE [facility];  Service: Cardiovascular;  Laterality: N/A;   Patient Active Problem List   Diagnosis Date Noted   Pain in right lower leg 05/05/2024   Multiple skin tears 03/06/2024   CAD in native artery 02/14/2024   CKD (chronic kidney disease)  stage 3, GFR 30-59 ml/min (HCC) 02/10/2024   Skin abrasion 08/30/2023   Cellulitis of left lower extremity 08/30/2023   Elevated PSA 03/14/2023   Elevated LFTs 03/14/2023   Atrial flutter (HCC) 02/13/2023   Chronic left SI joint pain 12/22/2022   DOE (dyspnea on exertion) 12/22/2022   Chest pain, atypical 10/10/2022   Upper airway cough syndrome 10/10/2022   Chronic cough 02/01/2022   Chronic right SI joint pain 05/11/2021   Umbilical hernia without obstruction or gangrene 02/02/2021   Hepatic steatosis 08/05/2020   Aortic atherosclerosis (HCC) 08/04/2020   Complex regional pain syndrome type I of the upper limb 07/26/2020   Flexion contracture of joint of hand 07/26/2020   Pacemaker 07/26/2020   Rib pain on left side 06/09/2020   Decreased pulses in feet 06/09/2020   Erectile dysfunction 03/03/2017   Chronic headaches 02/13/2017   Snoring 02/15/2015   Elevated liver enzymes 09/13/2014   Dupuytren's contracture of both hands 09/02/2013   Stiffness of right hand joint 05/07/2013   Gout 12/27/2011   Essential hypertension 01/12/2011   Complete heart block (HCC) - PPM 01/12/2011   Hyperglycemia 03/02/2009   HYPERPLASIA PROSTATE UNS W/O UR OBST & OTH LUTS 08/13/2008   Hyperlipidemia 02/17/2008   PAROXYSMAL SUPRAVENTRICULAR TACHYCARDIA 02/17/2008    PCP: Geofm Glade PARAS, MD  REFERRING PROVIDER: Joane Artist RAMAN, MD  REFERRING DIAG: Chronic bilateral low back pain without sciatica; Chronic SI joint pain; Chronic left shoulder pain  Rationale for Evaluation and Treatment: Rehabilitation  THERAPY DIAG:  Other low back pain  Muscle weakness (generalized)  ONSET DATE: Chronic   SUBJECTIVE:          SUBJECTIVE STATEMENT: Patient reports he became inactive years ago and they found that his SIJ are screwed up and they get better with shots. He has had a fall recently and had a new pacemaker put in. He reports that if he walks extended periods of time then he will be hurting right  around the back of the hips. He states he fell while in the hospital and with his new pacemaker he is not allowed to lift more that 10 lbs with the left arm. He does report feeling slightly off balance. He always uses a hand rail when going up and down stairs.   PERTINENT HISTORY:  See PMH above  PAIN:  Are you having pain? Yes:  NPRS scale: 1-2/10 currently Pain location: Bilateral lower back and SIJ region Pain description: Burning, lock-up Aggravating factors: Walking or standing extended periods, anything that requires bending Relieving factors: Patches or sprays, medication  PRECAUTIONS: Fall  RED FLAGS: None   WEIGHT BEARING RESTRICTIONS: No  FALLS:  Has patient fallen in last 6 months? Yes. Number of falls 1  LIVING ENVIRONMENT: Lives with: lives with their spouse Lives in: House/apartment Stairs: Yes: External: 2 steps; can reach both  PLOF: Independent  PATIENT GOALS: Get more flexible and move better.    OBJECTIVE:  Note: Objective measures were completed at Evaluation unless otherwise noted. PATIENT SURVEYS:  Modified Oswestry: 11/50 (22% disability)  COGNITION: Overall cognitive status: Within functional limits for tasks assessed     SENSATION: Not formally assessed, denies any sensation deficits  MUSCLE LENGTH: Limitation with hamstring, quad and hip flexibility  POSTURE:  Rounded shoulder posture, remains in slightly knee flexed/crouched position, decreased lumbar lordosis  PALPATION: Tender to palpation bilateral lumbar paraspinals, PSIS and SIJ region, piriformis region  Lumbar CPA hypomobile throughout  LUMBAR ROM:   AROM eval  Flexion Limited and tight  Extension Limited and painful  Right lateral flexion   Left lateral flexion   Right rotation Limited and tight  Left rotation Limited and tight   (Blank rows = not tested)  LOWER EXTREMITY ROM:    Hip PROM grossly limited in all directions with tightness reported at end  ranges  LOWER EXTREMITY MMT:    MMT Right eval Left eval  Hip flexion 4- 4-  Hip extension 3- 3-  Hip abduction 3 3  Hip adduction    Hip internal rotation    Hip external rotation    Knee flexion 4 4  Knee extension 4 4  Ankle dorsiflexion    Ankle plantarflexion    Ankle inversion    Ankle eversion     (Blank rows = not tested)  FUNCTIONAL TESTS:  5 times sit to stand: 15 seconds  Romberg stance: patient demonstrates greater difficulty with eyes closed   GAIT: Assistive device utilized: None Level of assistance: Complete Independence Comments: Remains in relative knee flexed/crouched, toe out, unsteady gait   TREATMENT [facility] Adult PT Treatment:  DATE: 05/07/2024 Hooklying SKTC stretch x 20 sec each Piriformis stretch x 20 sec each LTR 5 x 5 sec each Bridge 10 x 5 sec Hooklying clamshell with blue x 15 Seated hamstring stretch x 20 sec each  PATIENT EDUCATION:  Education details: Exam findings, POC, HEP Person educated: Patient Education method: Explanation, Demonstration, Tactile cues, Verbal cues, and Handouts Education comprehension: verbalized understanding, returned demonstration, verbal cues required, tactile cues required, and needs further education  HOME EXERCISE PROGRAM: Access Code: [email or web address]    ASSESSMENT: CLINICAL IMPRESSION: Patient is a 78 y.o. male who was seen today for physical therapy evaluation and treatment for chronic lower back and pelvic pain, and recent fall.   OBJECTIVE IMPAIRMENTS: Abnormal gait, decreased activity tolerance, decreased balance, decreased ROM, decreased strength, impaired flexibility, postural dysfunction, and pain.   ACTIVITY LIMITATIONS: carrying, lifting, bending, standing, stairs, and locomotion level  PARTICIPATION LIMITATIONS: meal prep, cleaning, shopping, and community activity  PERSONAL FACTORS: Fitness, Past/current experiences, and Time since onset of  injury/illness/exacerbation are also affecting patient's functional outcome.   REHAB POTENTIAL: Good  CLINICAL DECISION MAKING: Stable/uncomplicated  EVALUATION COMPLEXITY: Low   GOALS: Goals reviewed with patient? Yes  SHORT TERM GOALS: Target date: 06/04/2024  Patient will be I with initial HEP in order to progress with therapy. Baseline: HEP provided at eval Goal status: INITIAL  LONG TERM GOALS: Target date: 07/02/2024  Patient will be I with final HEP to maintain progress from PT. Baseline: HEP provided at eval Goal status: INITIAL  2.  Patient will report Modified Oswestry </= 5/50 (10% disability) in order to indicate an improvement in their functional status Baseline: 11/50 (22%) Goal status: INITIAL  3.  Patient will demonstrate 5xSTS </= 12 seconds to indicate improvement in strength and reduce fall risk Baseline: 15 seconds Goal status: INITIAL  4.  Patient will be able to walk for extended periods without need to stop due to pain in order to improve his functional ability Baseline: patient reports frequent stops while walking due to pain Goal status: INITIAL   PLAN: PT FREQUENCY: 1x/week  PT DURATION: 8 weeks  PLANNED INTERVENTIONS: 97164- PT Re-evaluation, 97750- Physical Performance Testing, 97110-Therapeutic exercises, 97530- Therapeutic activity, 97112- Neuromuscular re-education, 97535- Self Care, 02859- Manual therapy, 20560 (1-2 muscles), 20561 (3+ muscles)- Dry Needling, Patient/Family education, Balance training, Stair training, Taping, Joint mobilization, Joint manipulation, Spinal manipulation, Spinal mobilization, Cryotherapy, and Moist heat.  PLAN FOR NEXT SESSION: Review HEP and progress PRN, continue to work on LE and hip flexibility, progress LE, core, hip strengthening to improve activity tolerance, incorporate balance and stability exercises   Elaine Daring, PT, DPT, LAT, ATC 05/07/24  1:02 PM Phone: [phone] Fax: [phone]

## 2024-06-04 ENCOUNTER — Ambulatory Visit: Admitting: Family Medicine

## 2024-06-04 ENCOUNTER — Other Ambulatory Visit: Payer: Self-pay

## 2024-06-04 ENCOUNTER — Ambulatory Visit (INDEPENDENT_AMBULATORY_CARE_PROVIDER_SITE_OTHER)

## 2024-06-04 ENCOUNTER — Encounter: Payer: Self-pay | Admitting: Family Medicine

## 2024-06-04 VITALS — BP 122/80 | HR 87 | Ht 77.0 in | Wt 242.0 lb

## 2024-06-04 DIAGNOSIS — M8580 Other specified disorders of bone density and structure, unspecified site: Secondary | ICD-10-CM | POA: Diagnosis not present

## 2024-06-04 DIAGNOSIS — M5126 Other intervertebral disc displacement, lumbar region: Secondary | ICD-10-CM | POA: Diagnosis not present

## 2024-06-04 DIAGNOSIS — G8929 Other chronic pain: Secondary | ICD-10-CM | POA: Diagnosis not present

## 2024-06-04 DIAGNOSIS — M533 Sacrococcygeal disorders, not elsewhere classified: Secondary | ICD-10-CM

## 2024-06-04 DIAGNOSIS — M47815 Spondylosis without myelopathy or radiculopathy, thoracolumbar region: Secondary | ICD-10-CM | POA: Diagnosis not present

## 2024-06-04 DIAGNOSIS — M545 Low back pain, unspecified: Secondary | ICD-10-CM | POA: Diagnosis not present

## 2024-06-04 DIAGNOSIS — M47816 Spondylosis without myelopathy or radiculopathy, lumbar region: Secondary | ICD-10-CM

## 2024-06-04 DIAGNOSIS — M160 Bilateral primary osteoarthritis of hip: Secondary | ICD-10-CM | POA: Diagnosis not present

## 2024-06-04 NOTE — Progress Notes (Signed)
 I, Leotis Batter, CMA acting as a scribe for Artist Lloyd, MD.  [name] is a 78 y.o. male who presents to [facility] today for lower back pain. Pt was last seen by Dr. Lloyd on 03/13/24 and was given SI joint injections. Pt was also referred to physical therapy, completing 1 visit.    Today, patient reports exacerbation of lower back / SI joint sx over the past week. Pain with ambulation and standing. Has been using tylenol , ice, and bracing. Sx further exacerbated by driving back from the beach. Denies fall or new injury.   Pertinent review of systems: No fevers or chills  Relevant historical information: Hypertension.  Atrial flutter.   Exam:  BP 122/80   Pulse 87   Ht 6' 5 (1.956 m)   Wt 242 lb (109.8 kg)   SpO2 97%   BMI 28.70 kg/m  General: Well Developed, well nourished, and in no acute distress.   MSK: L-spine: Normal appearing Nontender palpation spinal midline Tender to palpation SI joints bilaterally.    Lab and Radiology Results  Procedure: Real-time Ultrasound Guided Injection of left SI joint Device: Philips Affiniti 50G/GE Logiq Images permanently stored and available for review in PACS Verbal informed consent obtained.  Discussed risks and benefits of procedure. Warned about infection, bleeding, hyperglycemia damage to structures among others. Patient expresses understanding and agreement Time-out conducted.   Noted no overlying erythema, induration, or other signs of local infection.   Skin prepped in a sterile fashion.   Local anesthesia: Topical Ethyl chloride.   With sterile technique and under real time ultrasound guidance: 40 mg of Kenalog  and 2 mL of Marcaine injected into SI joint. Fluid seen entering the joint capsule.   Completed without difficulty   Pain immediately resolved suggesting accurate placement of the medication.   Advised to call if fevers/chills, erythema, induration, drainage, or persistent  bleeding.   Images permanently stored and available for review in the ultrasound unit.  Impression: Technically successful ultrasound guided injection.    Procedure: Real-time Ultrasound Guided Injection of right SI joint Device: Philips Affiniti 50G/GE Logiq Images permanently stored and available for review in PACS Verbal informed consent obtained.  Discussed risks and benefits of procedure. Warned about infection, bleeding, hyperglycemia damage to structures among others. Patient expresses understanding and agreement Time-out conducted.   Noted no overlying erythema, induration, or other signs of local infection.   Skin prepped in a sterile fashion.   Local anesthesia: Topical Ethyl chloride.   With sterile technique and under real time ultrasound guidance: 40 mg of Kenalog  and 2 mL's of Marcaine injected into SI joint. Fluid seen entering the joint capsule.   Completed without difficulty   Pain immediately resolved suggesting accurate placement of the medication.   Advised to call if fevers/chills, erythema, induration, drainage, or persistent bleeding.   Images permanently stored and available for review in the ultrasound unit.  Impression: Technically successful ultrasound guided injection.  EXAM: LUMBAR MYELOGRAM   FLUOROSCOPY TIME:  Fluoroscopy Time: 48 seconds   Radiation Exposure Index: 381.25 microGray*m^2   PROCEDURE: After thorough discussion of risks and benefits of the procedure including bleeding, infection, injury to nerves, blood vessels, adjacent structures as well as headache and CSF leak, written and oral informed consent was obtained. Consent was obtained by Toya Cousin, PA. Time out form was completed.   Patient was positioned prone on the fluoroscopy table. Local anesthesia was provided with 1% lidocaine  without epinephrine after  prepped and draped in the usual sterile fashion. Puncture was performed at L5-S1 using a 3 1/2 inch 22-gauge spinal needle via  a left interlaminar approach. Using a single pass through the dura, the needle was placed within the thecal sac, with return of clear CSF. 15 mL of Isovue  M-200 was injected into the thecal sac, with normal opacification of the nerve roots and cauda equina consistent with free flow within the subarachnoid space.   Aimee Han, PA performed the lumbar puncture and administered the intrathecal contrast under my direct supervision. I also personally supervised acquisition of the myelogram images.   TECHNIQUE: Contiguous axial images were obtained through the Lumbar spine after the intrathecal infusion of contrast. Coronal and sagittal reconstructions were obtained of the axial image sets.   COMPARISON:  Lumbar spine radiographs 05/11/2021   FINDINGS: LUMBAR MYELOGRAM FINDINGS:   There are 5 non rib-bearing lumbar type vertebrae. Grade 1 retrolisthesis of L2 on L3, L3 on L4, and L4 on L5 does not change with flexion or extension. There are ventral extradural defects at these levels with mild-to-moderate spinal stenosis at L3-4 and mild spinal stenosis at L4-5. There is evidence of asymmetric right lateral recess stenosis at L3-4 partially effacing the right L4 nerve root.   CT LUMBAR MYELOGRAM FINDINGS:   Grade 1 retrolisthesis of L2 on L3, L3 on L4, and L4 on L5 measures 4 mm each. No acute fracture or suspicious osseous lesion is identified. Scattered small Schmorl's nodes are noted. Disc space narrowing is present throughout the lumbar spine and is moderate to severe at L1-2 and L3-4. The conus medullaris terminates at L1. There is extensive abdominal aortic atherosclerosis without aneurysm. The visualized portion of the liver is hypoattenuating suggesting steatosis.   T12-L1: Moderate left facet arthrosis without stenosis.   L1-2: Disc bulging and endplate spurring without stenosis.   L2-3: Retrolisthesis with bulging uncovered disc and mild facet and ligamentum flavum  hypertrophy result in mild spinal stenosis and mild left lateral recess stenosis without neural foraminal stenosis.   L3-4: Retrolisthesis with bulging uncovered disc, endplate spurring, prominent dorsal epidural fat, and moderate facet and ligamentum flavum hypertrophy result in moderate spinal stenosis, moderate right greater than left lateral recess stenosis, and severe right and mild left neural foraminal stenosis. Potential right L3 and L4 nerve root impingement.   L4-5: Retrolisthesis with bulging uncovered disc, endplate spurring, and moderate facet and ligamentum flavum hypertrophy result in mild left lateral recess stenosis and moderate left greater than right neural foraminal stenosis without significant spinal stenosis.   L5-S1: Disc bulging, endplate spurring, and severe facet and ligamentum flavum hypertrophy result in mild left greater than right neural foraminal stenosis without spinal stenosis.   IMPRESSION: 1. Advanced lumbar disc and facet degeneration, greatest at L3-4 where there is moderate spinal stenosis and severe right neural stenosis. 2. Moderate bilateral neural foraminal stenosis at L4-5. 3. Mild spinal stenosis at L2-3. 4. Aortic Atherosclerosis (ICD10-I70.0).     Electronically Signed   By: Dasie Hamburg M.D.   On: 08/10/2021 15:05   I, Artist Lloyd, personally (independently) visualized and performed the interpretation of the images attached in this note.   X-ray images lumbar spine and pelvis obtained today personally and independently interpreted. Multilevel DDD and facet DJD.  No acute fractures are visible.  Aortic atherosclerosis is present. SI joint degeneration is present bilaterally. Await formal radiology review    Assessment and Plan: 78 y.o. male with chronic low back pain.  Pain is multifactorial.  He does have significant arthritis including the facet joints in the lumbar spine on today's x-ray and on previous CT scan lumbar spine.   He has been doing marginally okay with bilateral SI joint injections.  His pain is worsening.  Plan to repeat SI joint injections today and recheck in 3 months for anticipated repeat SI joint injection.  Will refer to pain management to evaluate and consider facet medial branch block and ablation or potentially pain stimulator implant.   PDMP not reviewed this encounter. Orders Placed This Encounter  Procedures   DG Lumbar Spine 2-3 Views    Standing Status:   Future    Number of Occurrences:   1    Expiration Date:   07/05/2024    Reason for Exam (SYMPTOM  OR DIAGNOSIS REQUIRED):   low back / SI joint pain    Preferred imaging location?:   [facility]   DG Pelvis 1-2 Views    Standing Status:   Future    Number of Occurrences:   1    Expected Date:   06/04/2024    Expiration Date:   06/04/2025    Reason for Exam (SYMPTOM  OR DIAGNOSIS REQUIRED):   low back / SI joint pain    Preferred imaging location?:   [place]   US  LIMITED JOINT SPACE STRUCTURES LOW BILAT(NO LINKED CHARGES)    Reason for Exam (SYMPTOM  OR DIAGNOSIS REQUIRED):   bilat SI joint pain    Preferred imaging location?:    [facility]   Ambulatory referral to Pain Clinic    Referral Priority:   Routine    Referral Type:   Consultation    Referral Reason:   Specialty Services Required    Referred to Provider:   Darlis Deatrice RAMAN, MD    Requested Specialty:   Pain Medicine    Number of Visits Requested:   1   No orders of the defined types were placed in this encounter.    Discussed warning signs or symptoms. Please see discharge instructions. Patient expresses understanding.   The above documentation has been reviewed and is accurate and complete Artist Lloyd, M.D.

## 2024-06-04 NOTE — Patient Instructions (Signed)
 Thank you for coming in today.   Please get an Xray today before you leave   You received an injection today. Seek immediate medical attention if the joint becomes red, extremely painful, or is oozing fluid.   We've place a referral to Pain Management, you should hear about scheduling over the next 1-2 weeks.   See you back in 3 months.

## 2024-06-06 ENCOUNTER — Other Ambulatory Visit: Payer: Self-pay | Admitting: Internal Medicine

## 2024-06-06 ENCOUNTER — Telehealth: Payer: Self-pay | Admitting: Family Medicine

## 2024-06-06 NOTE — Telephone Encounter (Signed)
 Faxed patient demographic sheet to [facility].

## 2024-06-06 NOTE — Telephone Encounter (Signed)
 Bianca from [facility] called and stated that they received the referral for this patient but they do not have any demographic information in order to call the patient and schedule an appointment. She asked that a demographic sheet be faxed to her office. Please advise.

## 2024-06-09 DIAGNOSIS — D225 Melanocytic nevi of trunk: Secondary | ICD-10-CM | POA: Diagnosis not present

## 2024-06-09 DIAGNOSIS — L720 Epidermal cyst: Secondary | ICD-10-CM | POA: Diagnosis not present

## 2024-06-09 DIAGNOSIS — L578 Other skin changes due to chronic exposure to nonionizing radiation: Secondary | ICD-10-CM | POA: Diagnosis not present

## 2024-06-09 DIAGNOSIS — Z85828 Personal history of other malignant neoplasm of skin: Secondary | ICD-10-CM | POA: Diagnosis not present

## 2024-06-09 DIAGNOSIS — C44622 Squamous cell carcinoma of skin of right upper limb, including shoulder: Secondary | ICD-10-CM | POA: Diagnosis not present

## 2024-06-09 DIAGNOSIS — D485 Neoplasm of uncertain behavior of skin: Secondary | ICD-10-CM | POA: Diagnosis not present

## 2024-06-09 DIAGNOSIS — L814 Other melanin hyperpigmentation: Secondary | ICD-10-CM | POA: Diagnosis not present

## 2024-06-09 DIAGNOSIS — L570 Actinic keratosis: Secondary | ICD-10-CM | POA: Diagnosis not present

## 2024-06-09 DIAGNOSIS — L821 Other seborrheic keratosis: Secondary | ICD-10-CM | POA: Diagnosis not present

## 2024-06-09 DIAGNOSIS — C44329 Squamous cell carcinoma of skin of other parts of face: Secondary | ICD-10-CM | POA: Diagnosis not present

## 2024-06-10 ENCOUNTER — Ambulatory Visit: Payer: Self-pay | Admitting: Family Medicine

## 2024-06-10 NOTE — Progress Notes (Signed)
 Low back x-ray shows no broken bones.  You do have arthritis in the back.

## 2024-06-10 NOTE — Progress Notes (Signed)
 Pelvis x-ray shows some hip arthritis and possible osteonecrosis of the hip.  This could be evaluated more accurately with an MRI.  However I would expect that to cause pain in the front of the hip not in the back.  I do not think it is relevant for your pain.

## 2024-06-11 ENCOUNTER — Encounter: Payer: Self-pay | Admitting: Physical Therapy

## 2024-06-11 ENCOUNTER — Ambulatory Visit: Admitting: Family Medicine

## 2024-06-11 ENCOUNTER — Ambulatory Visit: Admitting: Physical Therapy

## 2024-06-11 ENCOUNTER — Other Ambulatory Visit: Payer: Self-pay

## 2024-06-11 DIAGNOSIS — M6281 Muscle weakness (generalized): Secondary | ICD-10-CM | POA: Diagnosis not present

## 2024-06-11 DIAGNOSIS — M5459 Other low back pain: Secondary | ICD-10-CM | POA: Diagnosis not present

## 2024-06-11 NOTE — Therapy (Signed)
 OUTPATIENT PHYSICAL THERAPY TREATMENT   Patient Name: [name] MRN: [id number] DOB:[birth year], 78 y.o., male Today's Date: 06/11/2024   END OF SESSION:  PT End of Session - 06/11/24 1031     Visit Number 2    Number of Visits 9    Date for PT Re-Evaluation 07/02/24    Authorization Type [employer]    PT Start Time 1016    PT Stop Time 1100    PT Time Calculation (min) 44 min    Activity Tolerance Patient tolerated treatment well    Behavior During Therapy WFL for tasks assessed/performed           Past Medical History:  Diagnosis Date   Complete heart block (HCC)    S/P  PPM by Dr Kelsie   Coronary artery disease    Non-obstructive. Pt had left heart catheterization in August 2009 showing a 30% mid-circumflex lesion, otherwise coronaries were clean angiographically   Hyperlipidemia    Hypertension    OSA (obstructive sleep apnea)    Probable; study never scheduled   Pacemaker 04/04/2024   Past Surgical History:  Procedure Laterality Date   BIV UPGRADE N/A 04/04/2024   Procedure: BIV UPGRADE;  Surgeon: Mealor, Eulas BRAVO, MD;  Location: [facility] INVASIVE CV [facility];  Service: Cardiovascular;  Laterality: N/A;   CARDIAC CATHETERIZATION  1993, 2009   2009- negative   COLONOSCOPY  2011   negative, [facility]   LEFT HEART CATH AND CORONARY ANGIOGRAPHY N/A 03/04/2024   Procedure: LEFT HEART CATH AND CORONARY ANGIOGRAPHY;  Surgeon: Wonda Sharper, MD;  Location: [facility] INVASIVE CV LAB;  Service: Cardiovascular;  Laterality: N/A;   PACEMAKER INSERTION  01/13/11   MDT by MILUS for complete heart block   PPM GENERATOR CHANGEOUT N/A 04/04/2024   Procedure: PPM GENERATOR CHANGEOUT;  Surgeon: Nancey, Eulas BRAVO, MD;  Location: [facility] INVASIVE CV LAB;  Service: Cardiovascular;  Laterality: N/A;   Patient Active Problem List   Diagnosis Date Noted   Pain in right lower leg 05/05/2024   Multiple skin tears 03/06/2024   CAD in native artery 02/14/2024   CKD (chronic kidney disease)  stage 3, GFR 30-59 ml/min (HCC) 02/10/2024   Skin abrasion 08/30/2023   Cellulitis of left lower extremity 08/30/2023   Elevated PSA 03/14/2023   Elevated LFTs 03/14/2023   Atrial flutter (HCC) 02/13/2023   Chronic left SI joint pain 12/22/2022   DOE (dyspnea on exertion) 12/22/2022   Chest pain, atypical 10/10/2022   Upper airway cough syndrome 10/10/2022   Chronic cough 02/01/2022   Chronic right SI joint pain 05/11/2021   Umbilical hernia without obstruction or gangrene 02/02/2021   Hepatic steatosis 08/05/2020   Aortic atherosclerosis (HCC) 08/04/2020   Complex regional pain syndrome type I of the upper limb 07/26/2020   Flexion contracture of joint of hand 07/26/2020   Pacemaker 07/26/2020   Rib pain on left side 06/09/2020   Decreased pulses in feet 06/09/2020   Erectile dysfunction 03/03/2017   Chronic headaches 02/13/2017   Snoring 02/15/2015   Elevated liver enzymes 09/13/2014   Dupuytren's contracture of both hands 09/02/2013   Stiffness of right hand joint 05/07/2013   Gout 12/27/2011   Essential hypertension 01/12/2011   Complete heart block (HCC) - PPM 01/12/2011   Hyperglycemia 03/02/2009   HYPERPLASIA PROSTATE UNS W/O UR OBST & OTH LUTS 08/13/2008   Hyperlipidemia 02/17/2008   PAROXYSMAL SUPRAVENTRICULAR TACHYCARDIA 02/17/2008    PCP: Geofm Glade PARAS, MD  REFERRING PROVIDER: Joane Artist RAMAN, MD  REFERRING DIAG: Chronic bilateral low back pain without sciatica; Chronic SI joint pain; Chronic left shoulder pain  Rationale for Evaluation and Treatment: Rehabilitation  THERAPY DIAG:  Other low back pain  Muscle weakness (generalized)  ONSET DATE: Chronic   SUBJECTIVE:          SUBJECTIVE STATEMENT: Patient reports he had a little set back since last visit. He was at the beach and one morning and couldn't get out of bed. He came in last week to see the doctor and got a few shots which did help. States his pain is worse in the mornings and better during the  day. He hasn't had to use a can since the shots. The right lower back and SI region is worse.   Eval: Patient reports he became inactive years ago and they found that his SIJ are screwed up and they get better with shots. He has had a fall recently and had a new pacemaker put in. He reports that if he walks extended periods of time then he will be hurting right around the back of the hips. He states he fell while in the hospital and with his new pacemaker he is not allowed to lift more that 10 lbs with the left arm. He does report feeling slightly off balance. He always uses a hand rail when going up and down stairs.   PERTINENT HISTORY:  See PMH above  PAIN:  Are you having pain? Yes:  NPRS scale: 1-2/10 currently Pain location: Bilateral lower back and SIJ region Pain description: Burning, lock-up Aggravating factors: Walking or standing extended periods, anything that requires bending Relieving factors: Patches or sprays, medication  PRECAUTIONS: Fall  WEIGHT BEARING RESTRICTIONS: No  FALLS:  Has patient fallen in last 6 months? Yes. Number of falls 1  PATIENT GOALS: Get more flexible and move better.    OBJECTIVE:  Note: Objective measures were completed at Evaluation unless otherwise noted. PATIENT SURVEYS:  Modified Oswestry: 11/50 (22% disability)  MUSCLE LENGTH: Limitation with hamstring, quad and hip flexibility  POSTURE:  Rounded shoulder posture, remains in slightly knee flexed/crouched position, decreased lumbar lordosis  PALPATION: Tender to palpation bilateral lumbar paraspinals, PSIS and SIJ region, piriformis region  Lumbar CPA hypomobile throughout  LUMBAR ROM:   AROM eval  Flexion Limited and tight  Extension Limited and painful  Right lateral flexion   Left lateral flexion   Right rotation Limited and tight  Left rotation Limited and tight   (Blank rows = not tested)  LOWER EXTREMITY ROM:    Hip PROM grossly limited in all directions with  tightness reported at end ranges  LOWER EXTREMITY MMT:    MMT Right eval Left eval  Hip flexion 4- 4-  Hip extension 3- 3-  Hip abduction 3 3  Hip adduction    Hip internal rotation    Hip external rotation    Knee flexion 4 4  Knee extension 4 4  Ankle dorsiflexion    Ankle plantarflexion    Ankle inversion    Ankle eversion     (Blank rows = not tested)  FUNCTIONAL TESTS:  5 times sit to stand: 15 seconds  Romberg stance: patient demonstrates greater difficulty with eyes closed   GAIT: Assistive device utilized: None Level of assistance: Complete Independence Comments: Remains in relative knee flexed/crouched, toe out, unsteady gait   TREATMENT [facility] Adult PT Treatment:  DATE: 06/11/2024 Recumbent bike L3 x 5 min to improve endurance and workload capacity Seated hamstring stretch 3 x 20 sec each LTR 2 x 10 Piriformis stretch 3 x 20 sec each Modified thomas stretch 3 x 30 sec each Hooklying clamshell with green 2 x 15  PATIENT EDUCATION:  Education details: HEP Person educated: Patient Education method: Programmer, multimedia, Demonstration, Actor cues, Verbal cues Education comprehension: verbalized understanding, returned demonstration, verbal cues required, tactile cues required, and needs further education  HOME EXERCISE PROGRAM: Access Code: [email or web address]    ASSESSMENT: CLINICAL IMPRESSION: Patient tolerated therapy well with no adverse effects. Therapy focused primarily on improving patient's mobility and flexibility with good tolerance. He reports flare up of symptoms and had to see the doctor earlier for repeat SI injections. He is still having difficulty with his mobility and did report improvement in his movement following therapy. No changes were made to his HEP this visit. Patient would benefit from continued skilled PT to progress mobility and strength in order to reduce pain and maximize functional ability.   Eval:  Patient is a 78 y.o. male who was seen today for physical therapy evaluation and treatment for chronic lower back and pelvic pain, and recent fall.   OBJECTIVE IMPAIRMENTS: Abnormal gait, decreased activity tolerance, decreased balance, decreased ROM, decreased strength, impaired flexibility, postural dysfunction, and pain.   ACTIVITY LIMITATIONS: carrying, lifting, bending, standing, stairs, and locomotion level  PARTICIPATION LIMITATIONS: meal prep, cleaning, shopping, and community activity  PERSONAL FACTORS: Fitness, Past/current experiences, and Time since onset of injury/illness/exacerbation are also affecting patient's functional outcome.    GOALS: Goals reviewed with patient? Yes  SHORT TERM GOALS: Target date: 06/04/2024  Patient will be I with initial HEP in order to progress with therapy. Baseline: HEP provided at eval 06/11/2024: independent Goal status: MET  LONG TERM GOALS: Target date: 07/02/2024  Patient will be I with final HEP to maintain progress from PT. Baseline: HEP provided at eval Goal status: INITIAL  2.  Patient will report Modified Oswestry </= 5/50 (10% disability) in order to indicate an improvement in their functional status Baseline: 11/50 (22%) Goal status: INITIAL  3.  Patient will demonstrate 5xSTS </= 12 seconds to indicate improvement in strength and reduce fall risk Baseline: 15 seconds Goal status: INITIAL  4.  Patient will be able to walk for extended periods without need to stop due to pain in order to improve his functional ability Baseline: patient reports frequent stops while walking due to pain Goal status: INITIAL   PLAN: PT FREQUENCY: 1x/week  PT DURATION: 8 weeks  PLANNED INTERVENTIONS: 97164- PT Re-evaluation, 97750- Physical Performance Testing, 97110-Therapeutic exercises, 97530- Therapeutic activity, 97112- Neuromuscular re-education, 97535- Self Care, 02859- Manual therapy, 20560 (1-2 muscles), 20561 (3+ muscles)- Dry  Needling, Patient/Family education, Balance training, Stair training, Taping, Joint mobilization, Joint manipulation, Spinal manipulation, Spinal mobilization, Cryotherapy, and Moist heat.  PLAN FOR NEXT SESSION: Review HEP and progress PRN, continue to work on LE and hip flexibility, progress LE, core, hip strengthening to improve activity tolerance, incorporate balance and stability exercises   Elaine Daring, PT, DPT, LAT, ATC 06/11/24  11:50 AM Phone: [phone] Fax: [phone]

## 2024-06-12 ENCOUNTER — Telehealth: Payer: Self-pay

## 2024-06-12 NOTE — Telephone Encounter (Signed)
 SABRA

## 2024-06-13 ENCOUNTER — Ambulatory Visit: Admitting: Family Medicine

## 2024-06-16 DIAGNOSIS — C44329 Squamous cell carcinoma of skin of other parts of face: Secondary | ICD-10-CM | POA: Diagnosis not present

## 2024-06-18 ENCOUNTER — Ambulatory Visit: Admitting: Physical Therapy

## 2024-06-18 ENCOUNTER — Other Ambulatory Visit: Payer: Self-pay

## 2024-06-18 ENCOUNTER — Encounter: Payer: Self-pay | Admitting: Physical Therapy

## 2024-06-18 DIAGNOSIS — M5459 Other low back pain: Secondary | ICD-10-CM

## 2024-06-18 DIAGNOSIS — M6281 Muscle weakness (generalized): Secondary | ICD-10-CM | POA: Diagnosis not present

## 2024-06-18 NOTE — Therapy (Signed)
 OUTPATIENT PHYSICAL THERAPY TREATMENT   Patient Name: [name] MRN: [id number] DOB:08-May-1946, 78 y.o., male Today's Date: 06/18/2024   END OF SESSION:  PT End of Session - 06/18/24 1025     Visit Number 3    Number of Visits 9    Date for PT Re-Evaluation 07/02/24    Authorization Type [employer]    PT Start Time 1020    PT Stop Time 1100    PT Time Calculation (min) 40 min    Activity Tolerance Patient tolerated treatment well    Behavior During Therapy WFL for tasks assessed/performed            Past Medical History:  Diagnosis Date   Complete heart block (HCC)    S/P  PPM by Dr Kelsie   Coronary artery disease    Non-obstructive. Pt had left heart catheterization in August 2009 showing a 30% mid-circumflex lesion, otherwise coronaries were clean angiographically   Hyperlipidemia    Hypertension    OSA (obstructive sleep apnea)    Probable; study never scheduled   Pacemaker 04/04/2024   Past Surgical History:  Procedure Laterality Date   BIV UPGRADE N/A 04/04/2024   Procedure: BIV UPGRADE;  Surgeon: Mealor, Eulas BRAVO, MD;  Location: [facility] INVASIVE CV [facility];  Service: Cardiovascular;  Laterality: N/A;   CARDIAC CATHETERIZATION  1993, 2009   2009- negative   COLONOSCOPY  2011   negative, [facility]   LEFT HEART CATH AND CORONARY ANGIOGRAPHY N/A 03/04/2024   Procedure: LEFT HEART CATH AND CORONARY ANGIOGRAPHY;  Surgeon: Wonda Sharper, MD;  Location: [facility] INVASIVE CV LAB;  Service: Cardiovascular;  Laterality: N/A;   PACEMAKER INSERTION  01/13/11   MDT by MILUS for complete heart block   PPM GENERATOR CHANGEOUT N/A 04/04/2024   Procedure: PPM GENERATOR CHANGEOUT;  Surgeon: Nancey, Eulas BRAVO, MD;  Location: [facility] INVASIVE CV LAB;  Service: Cardiovascular;  Laterality: N/A;   Patient Active Problem List   Diagnosis Date Noted   Pain in right lower leg 05/05/2024   Multiple skin tears 03/06/2024   CAD in native artery 02/14/2024   CKD (chronic kidney disease)  stage 3, GFR 30-59 ml/min (HCC) 02/10/2024   Skin abrasion 08/30/2023   Cellulitis of left lower extremity 08/30/2023   Elevated PSA 03/14/2023   Elevated LFTs 03/14/2023   Atrial flutter (HCC) 02/13/2023   Chronic left SI joint pain 12/22/2022   DOE (dyspnea on exertion) 12/22/2022   Chest pain, atypical 10/10/2022   Upper airway cough syndrome 10/10/2022   Chronic cough 02/01/2022   Chronic right SI joint pain 05/11/2021   Umbilical hernia without obstruction or gangrene 02/02/2021   Hepatic steatosis 08/05/2020   Aortic atherosclerosis (HCC) 08/04/2020   Complex regional pain syndrome type I of the upper limb 07/26/2020   Flexion contracture of joint of hand 07/26/2020   Pacemaker 07/26/2020   Rib pain on left side 06/09/2020   Decreased pulses in feet 06/09/2020   Erectile dysfunction 03/03/2017   Chronic headaches 02/13/2017   Snoring 02/15/2015   Elevated liver enzymes 09/13/2014   Dupuytren's contracture of both hands 09/02/2013   Stiffness of right hand joint 05/07/2013   Gout 12/27/2011   Essential hypertension 01/12/2011   Complete heart block (HCC) - PPM 01/12/2011   Hyperglycemia 03/02/2009   HYPERPLASIA PROSTATE UNS W/O UR OBST & OTH LUTS 08/13/2008   Hyperlipidemia 02/17/2008   PAROXYSMAL SUPRAVENTRICULAR TACHYCARDIA 02/17/2008    PCP: Geofm Glade PARAS, MD  REFERRING PROVIDER: Joane Artist RAMAN,  MD  REFERRING DIAG: Chronic bilateral low back pain without sciatica; Chronic SI joint pain; Chronic left shoulder pain  Rationale for Evaluation and Treatment: Rehabilitation  THERAPY DIAG:  Other low back pain  Muscle weakness (generalized)  ONSET DATE: Chronic   SUBJECTIVE:          SUBJECTIVE STATEMENT: Patient reports continued stiffness and pain around the SIJ region that seems to be worse in the morning.  Eval: Patient reports he became inactive years ago and they found that his SIJ are screwed up and they get better with shots. He has had a fall recently  and had a new pacemaker put in. He reports that if he walks extended periods of time then he will be hurting right around the back of the hips. He states he fell while in the hospital and with his new pacemaker he is not allowed to lift more that 10 lbs with the left arm. He does report feeling slightly off balance. He always uses a hand rail when going up and down stairs.   PERTINENT HISTORY:  See PMH above  PAIN:  Are you having pain? Yes:  NPRS scale: 3/10 currently Pain location: Bilateral lower back and SIJ region Pain description: Stiff, locked-up Aggravating factors: Worse in morning, walking or standing extended periods, anything that requires bending Relieving factors: Patches or sprays, medication  PRECAUTIONS: Fall  WEIGHT BEARING RESTRICTIONS: No  FALLS:  Has patient fallen in last 6 months? Yes. Number of falls 1  PATIENT GOALS: Get more flexible and move better.    OBJECTIVE:  Note: Objective measures were completed at Evaluation unless otherwise noted. PATIENT SURVEYS:  Modified Oswestry: 11/50 (22% disability)  MUSCLE LENGTH: Limitation with hamstring, quad and hip flexibility  POSTURE:  Rounded shoulder posture, remains in slightly knee flexed/crouched position, decreased lumbar lordosis  PALPATION: Tender to palpation bilateral lumbar paraspinals, PSIS and SIJ region, piriformis region  Lumbar CPA hypomobile throughout  LUMBAR ROM:   AROM eval  Flexion Limited and tight  Extension Limited and painful  Right lateral flexion   Left lateral flexion   Right rotation Limited and tight  Left rotation Limited and tight   (Blank rows = not tested)  LOWER EXTREMITY ROM:    Hip PROM grossly limited in all directions with tightness reported at end ranges  LOWER EXTREMITY MMT:    MMT Right eval Left eval  Hip flexion 4- 4-  Hip extension 3- 3-  Hip abduction 3 3  Hip adduction    Hip internal rotation    Hip external rotation    Knee flexion 4 4   Knee extension 4 4  Ankle dorsiflexion    Ankle plantarflexion    Ankle inversion    Ankle eversion     (Blank rows = not tested)  FUNCTIONAL TESTS:  5 times sit to stand: 15 seconds  Romberg stance: patient demonstrates greater difficulty with eyes closed   GAIT: Assistive device utilized: None Level of assistance: Complete Independence Comments: Remains in relative knee flexed/crouched, toe out, unsteady gait   TREATMENT [facility] Adult PT Treatment:                                                DATE: 06/18/2024 Recumbent bike L3 x 8 min to improve endurance and workload capacity Supine passive hamstring stretch 3 x 30 sec each  Supine hip PROM all directions each side Supine passive knee to chest stretch 2 x 30 sec each Supine passive piriformis stretch 2 x 30 sec each LTR 5 x 10 sec each Modified thomas stretch with passive knee flexion 3 x 30 sec each Hooklying clamshell with black 2 x 15 x 5 sec  PATIENT EDUCATION:  Education details: HEP Person educated: Patient Education method: Programmer, multimedia, Demonstration, Actor cues, Verbal cues Education comprehension: verbalized understanding, returned demonstration, verbal cues required, tactile cues required, and needs further education  HOME EXERCISE PROGRAM: Access Code: [email or web address]    ASSESSMENT: CLINICAL IMPRESSION: Patient tolerated therapy well with no adverse effects. Therapy focused primarily on improving patient's mobility and flexibility with good tolerance. He continues to report primary issues is stiffness in the morning so encouraged him to perform stretches prior to getting up in the morning. He did report feeling looser post therapy. He will be going out of town for a month so will return to therapy at that time. No changes were made to his HEP this visit. Patient would benefit from continued skilled PT to progress mobility and strength in order to reduce pain and maximize functional ability.   Eval: Patient is a 78  y.o. male who was seen today for physical therapy evaluation and treatment for chronic lower back and pelvic pain, and recent fall.   OBJECTIVE IMPAIRMENTS: Abnormal gait, decreased activity tolerance, decreased balance, decreased ROM, decreased strength, impaired flexibility, postural dysfunction, and pain.   ACTIVITY LIMITATIONS: carrying, lifting, bending, standing, stairs, and locomotion level  PARTICIPATION LIMITATIONS: meal prep, cleaning, shopping, and community activity  PERSONAL FACTORS: Fitness, Past/current experiences, and Time since onset of injury/illness/exacerbation are also affecting patient's functional outcome.    GOALS: Goals reviewed with patient? Yes  SHORT TERM GOALS: Target date: 06/04/2024  Patient will be I with initial HEP in order to progress with therapy. Baseline: HEP provided at eval 06/11/2024: independent Goal status: MET  LONG TERM GOALS: Target date: 07/02/2024  Patient will be I with final HEP to maintain progress from PT. Baseline: HEP provided at eval Goal status: INITIAL  2.  Patient will report Modified Oswestry </= 5/50 (10% disability) in order to indicate an improvement in their functional status Baseline: 11/50 (22%) Goal status: INITIAL  3.  Patient will demonstrate 5xSTS </= 12 seconds to indicate improvement in strength and reduce fall risk Baseline: 15 seconds Goal status: INITIAL  4.  Patient will be able to walk for extended periods without need to stop due to pain in order to improve his functional ability Baseline: patient reports frequent stops while walking due to pain Goal status: INITIAL   PLAN: PT FREQUENCY: 1x/week  PT DURATION: 8 weeks  PLANNED INTERVENTIONS: 97164- PT Re-evaluation, 97750- Physical Performance Testing, 97110-Therapeutic exercises, 97530- Therapeutic activity, 97112- Neuromuscular re-education, 97535- Self Care, 02859- Manual therapy, 20560 (1-2 muscles), 20561 (3+ muscles)- Dry Needling,  Patient/Family education, Balance training, Stair training, Taping, Joint mobilization, Joint manipulation, Spinal manipulation, Spinal mobilization, Cryotherapy, and Moist heat.  PLAN FOR NEXT SESSION: Review HEP and progress PRN, continue to work on LE and hip flexibility, progress LE, core, hip strengthening to improve activity tolerance, incorporate balance and stability exercises   Elaine Daring, PT, DPT, LAT, ATC 06/18/24  11:02 AM Phone: [phone] Fax: [phone]

## 2024-06-25 ENCOUNTER — Encounter: Admitting: Physical Therapy

## 2024-06-25 ENCOUNTER — Ambulatory Visit: Payer: PPO

## 2024-06-25 DIAGNOSIS — Z79899 Other long term (current) drug therapy: Secondary | ICD-10-CM | POA: Diagnosis not present

## 2024-06-25 DIAGNOSIS — M461 Sacroiliitis, not elsewhere classified: Secondary | ICD-10-CM | POA: Diagnosis not present

## 2024-06-25 DIAGNOSIS — Z79891 Long term (current) use of opiate analgesic: Secondary | ICD-10-CM | POA: Diagnosis not present

## 2024-06-27 NOTE — Telephone Encounter (Signed)
 erroe

## 2024-07-02 ENCOUNTER — Encounter: Admitting: Physical Therapy

## 2024-07-07 ENCOUNTER — Telehealth: Payer: Self-pay | Admitting: Internal Medicine

## 2024-07-07 NOTE — Telephone Encounter (Signed)
 Pt made a lab appt as an office visit appt has been changed to a lab appt and pt will like his labs done a week before scheduled CPE. Please advise, Thanks

## 2024-07-08 NOTE — Progress Notes (Unsigned)
     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 male who presents today for routine electrophysiology followup.  Since last being seen in our clinic, the patient reports doing very well.  Today, he denies symptoms of palpitations, chest pain, shortness of breath,  lower extremity edema, dizziness, presyncope, or syncope.  The patient is otherwise without complaint today.   He has had sustained atrial high rate episodes detected. The atrial rate was about 250 bpm when EGM's are present. Often, only marker channel data is available.V rates are controlled, pt is V-paced.    He has had multiple episodes of severe bleeding and is once had to call a cleaning company to assist with clean up after bleeding episode.  His skin is very thin and tears easily.  He was referred to Dr. Kennyth to discuss watchman and thought to be a good candidate for left atrial appendage closure.  During evaluation, he was referred for an echocardiogram which showed an LV ejection fraction of 30%.  He is scheduled to undergo coronary angiogram.  he has no device related complaints -- no new tenderness, drainage, redness.     Physical Exam: There were no vitals filed for this visit.     GEN- The patient is well appearing, alert and oriented x 3 today.   Head- normocephalic, atraumatic Lungs- normal work of breathing Chest- pacemaker pocket is well healed Heart- Regular rate and rhythm, no murmurs, rubs or gallops, PMI not laterally displaced GI- soft, NT, ND, + BS Extremities- no clubbing, cyanosis, or edema  Pacemaker interrogation- reviewed in detail today,  See PACEART report  ekg tracing ordered today is personally reviewed and shows AV paced  Assessment and Plan:  Symptomatic complete heart block Normal pacemaker function See [name] Art report Status post upgrade to a CRT device Apr 04, 2024  Atrial high rate episodes --  Noted on PPM -- completely asymptomatic,  V-rate controlled. The few actual EGMs recorded appear most consistent with flutter. Atrial rates approximately 250 bpm.  Will continue to monitor through device. No changes  CHF with reduced ejection fraction EF 30% March 2025 He is scheduled for coronary angiogram If no active coronary disease, will need to plan upgrade to CRT device  Secondary hypercoagulable state He has been having profuse bleeding Undergoing evaluation for LAA occlusion with Dr. Kennyth Eulas Mccarty Nancey, MD  07/08/2024 10:01 PM

## 2024-07-09 ENCOUNTER — Ambulatory Visit (INDEPENDENT_AMBULATORY_CARE_PROVIDER_SITE_OTHER)

## 2024-07-09 ENCOUNTER — Encounter: Payer: Self-pay | Admitting: Cardiovascular Disease

## 2024-07-09 ENCOUNTER — Ambulatory Visit (HOSPITAL_COMMUNITY)
Admission: RE | Admit: 2024-07-09 | Discharge: 2024-07-09 | Disposition: A | Discharge status: Home / Self Care | Source: Ambulatory Visit | Attending: Cardiology | Admitting: Cardiology

## 2024-07-09 ENCOUNTER — Ambulatory Visit: Attending: Cardiology | Admitting: Cardiovascular Disease

## 2024-07-09 VITALS — BP 124/80 | HR 99 | Ht 77.0 in | Wt 236.0 lb

## 2024-07-09 DIAGNOSIS — I5020 Unspecified systolic (congestive) heart failure: Secondary | ICD-10-CM | POA: Diagnosis not present

## 2024-07-09 DIAGNOSIS — I483 Typical atrial flutter: Secondary | ICD-10-CM

## 2024-07-09 DIAGNOSIS — I442 Atrioventricular block, complete: Secondary | ICD-10-CM

## 2024-07-09 DIAGNOSIS — I471 Supraventricular tachycardia, unspecified: Secondary | ICD-10-CM

## 2024-07-09 LAB — CUP PACEART INCLINIC DEVICE CHECK
Date Time Interrogation Session: 20250827125107
Implantable Lead Connection Status: 753985
Implantable Lead Connection Status: 753985
Implantable Lead Connection Status: 753985
Implantable Lead Implant Date: 20120302
Implantable Lead Implant Date: 20120302
Implantable Lead Implant Date: 20250523
Implantable Lead Location: 753858
Implantable Lead Location: 753859
Implantable Lead Location: 753860
Implantable Lead Model: 4598
Implantable Lead Model: 5076
Implantable Pulse Generator Implant Date: 20250523

## 2024-07-09 LAB — ECHOCARDIOGRAM LIMITED
Area-P 1/2: 2.94 cm2
Height: 77 in
S' Lateral: 3.79 cm
Weight: 3776 [oz_av]

## 2024-07-09 MED ORDER — PERFLUTREN LIPID MICROSPHERE
1.0000 mL | INTRAVENOUS | Status: AC | PRN
  Administered 2024-07-09: 2 mL via INTRAVENOUS

## 2024-07-09 NOTE — Patient Instructions (Signed)

## 2024-07-10 LAB — CUP PACEART REMOTE DEVICE CHECK
Battery Remaining Longevity: 99 mo
Battery Voltage: 3.15 V
Brady Statistic AP VP Percent: 1.19 %
Brady Statistic AP VS Percent: 0.01 %
Brady Statistic AS VP Percent: 98.66 %
Brady Statistic AS VS Percent: 0.14 %
Brady Statistic RA Percent Paced: 1.19 %
Brady Statistic RV Percent Paced: 99.85 %
Date Time Interrogation Session: 20250827072706
Implantable Lead Connection Status: 753985
Implantable Lead Connection Status: 753985
Implantable Lead Connection Status: 753985
Implantable Lead Implant Date: 20120302
Implantable Lead Implant Date: 20120302
Implantable Lead Implant Date: 20250523
Implantable Lead Location: 753858
Implantable Lead Location: 753859
Implantable Lead Location: 753860
Implantable Lead Model: 4598
Implantable Lead Model: 5076
Implantable Pulse Generator Implant Date: 20250523
Lead Channel Impedance Value: 342 Ohm
Lead Channel Impedance Value: 399 Ohm
Lead Channel Impedance Value: 399 Ohm
Lead Channel Impedance Value: 418 Ohm
Lead Channel Impedance Value: 456 Ohm
Lead Channel Impedance Value: 475 Ohm
Lead Channel Impedance Value: 494 Ohm
Lead Channel Impedance Value: 532 Ohm
Lead Channel Impedance Value: 551 Ohm
Lead Channel Impedance Value: 684 Ohm
Lead Channel Impedance Value: 741 Ohm
Lead Channel Impedance Value: 760 Ohm
Lead Channel Impedance Value: 779 Ohm
Lead Channel Impedance Value: 817 Ohm
Lead Channel Pacing Threshold Amplitude: 0.625 V
Lead Channel Pacing Threshold Amplitude: 0.75 V
Lead Channel Pacing Threshold Amplitude: 1 V
Lead Channel Pacing Threshold Pulse Width: 0.4 ms
Lead Channel Pacing Threshold Pulse Width: 0.4 ms
Lead Channel Pacing Threshold Pulse Width: 1 ms
Lead Channel Sensing Intrinsic Amplitude: 13.5 mV
Lead Channel Sensing Intrinsic Amplitude: 13.5 mV
Lead Channel Sensing Intrinsic Amplitude: 5.375 mV
Lead Channel Sensing Intrinsic Amplitude: 5.375 mV
Lead Channel Setting Pacing Amplitude: 1.5 V
Lead Channel Setting Pacing Amplitude: 2 V
Lead Channel Setting Pacing Amplitude: 2 V
Lead Channel Setting Pacing Pulse Width: 0.4 ms
Lead Channel Setting Pacing Pulse Width: 1 ms
Lead Channel Setting Sensing Sensitivity: 0.9 mV
Zone Setting Status: 755011
Zone Setting Status: 755011

## 2024-07-13 ENCOUNTER — Other Ambulatory Visit: Payer: Self-pay | Admitting: Internal Medicine

## 2024-07-15 ENCOUNTER — Telehealth: Payer: Self-pay

## 2024-07-15 NOTE — Telephone Encounter (Signed)
 Outreach made to Pt.  Call went to VM.  Left message requesting call back.

## 2024-07-15 NOTE — Telephone Encounter (Signed)
 Alert received:  Alert remote transmission: 1 or more monitored VT episodes detected. 2 VT monitored event, longest on 9/1 at 0253 x >40 beats (at beginning and end of EGM, V rate drops below detection). V-rates 150s-200 bpm.  V>A.  To triage.

## 2024-07-15 NOTE — Telephone Encounter (Signed)
 Pt requesting a c/b from the nurse

## 2024-07-16 ENCOUNTER — Encounter: Payer: Self-pay | Admitting: Physical Therapy

## 2024-07-16 ENCOUNTER — Ambulatory Visit: Admitting: Physical Therapy

## 2024-07-16 ENCOUNTER — Other Ambulatory Visit: Payer: Self-pay

## 2024-07-16 ENCOUNTER — Encounter: Payer: Self-pay | Admitting: Internal Medicine

## 2024-07-16 DIAGNOSIS — M5459 Other low back pain: Secondary | ICD-10-CM

## 2024-07-16 DIAGNOSIS — L570 Actinic keratosis: Secondary | ICD-10-CM | POA: Diagnosis not present

## 2024-07-16 DIAGNOSIS — M6281 Muscle weakness (generalized): Secondary | ICD-10-CM

## 2024-07-16 DIAGNOSIS — C44622 Squamous cell carcinoma of skin of right upper limb, including shoulder: Secondary | ICD-10-CM | POA: Diagnosis not present

## 2024-07-16 NOTE — Therapy (Signed)
 OUTPATIENT PHYSICAL THERAPY TREATMENT   Patient Name: [name] MRN: [id number] DOB:Oct 17, 1946, 78 y.o., male Today's Date: 07/16/2024   END OF SESSION:  PT End of Session - 07/16/24 1028     Visit Number 4    Number of Visits 12    Date for PT Re-Evaluation 09/10/24    Authorization Type [employer]    PT Start Time 1016    PT Stop Time 1100    PT Time Calculation (min) 44 min    Activity Tolerance Patient tolerated treatment well    Behavior During Therapy WFL for tasks assessed/performed             Past Medical History:  Diagnosis Date   Complete heart block (HCC)    S/P  PPM by Dr Kelsie   Coronary artery disease    Non-obstructive. Pt had left heart catheterization in August 2009 showing a 30% mid-circumflex lesion, otherwise coronaries were clean angiographically   Hyperlipidemia    Hypertension    OSA (obstructive sleep apnea)    Probable; study never scheduled   Pacemaker 04/04/2024   Past Surgical History:  Procedure Laterality Date   BIV UPGRADE N/A 04/04/2024   Procedure: BIV UPGRADE;  Surgeon: Mealor, Eulas BRAVO, MD;  Location: [facility] INVASIVE CV [facility];  Service: Cardiovascular;  Laterality: N/A;   CARDIAC CATHETERIZATION  1993, 2009   2009- negative   COLONOSCOPY  2011   negative, [facility]   LEFT HEART CATH AND CORONARY ANGIOGRAPHY N/A 03/04/2024   Procedure: LEFT HEART CATH AND CORONARY ANGIOGRAPHY;  Surgeon: Wonda Sharper, MD;  Location: [facility] INVASIVE CV LAB;  Service: Cardiovascular;  Laterality: N/A;   PACEMAKER INSERTION  01/13/11   MDT by MILUS for complete heart block   PPM GENERATOR CHANGEOUT N/A 04/04/2024   Procedure: PPM GENERATOR CHANGEOUT;  Surgeon: Nancey, Eulas BRAVO, MD;  Location: [facility] INVAS[facility] CV [facility];  Service: Cardiovascular;  Laterality: N/A;   Patient Active Problem List   Diagnosis Date Noted   Pain in right lower leg 05/05/2024   Multiple skin tears 03/06/2024   CAD in native artery 02/14/2024   CKD (chronic kidney  disease) stage 3, GFR 30-59 ml/min (HCC) 02/10/2024   Skin abrasion 08/30/2023   Cellulitis of left lower extremity 08/30/2023   Elevated PSA 03/14/2023   Elevated LFTs 03/14/2023   Atrial flutter (HCC) 02/13/2023   Chronic left SI joint pain 12/22/2022   DOE (dyspnea on exertion) 12/22/2022   Chest pain, atypical 10/10/2022   Upper airway cough syndrome 10/10/2022   Chronic cough 02/01/2022   Chronic right SI joint pain 05/11/2021   Umbilical hernia without obstruction or gangrene 02/02/2021   Hepatic steatosis 08/05/2020   Aortic atherosclerosis (HCC) 08/04/2020   Complex regional pain syndrome type I of the upper limb 07/26/2020   Flexion contracture of joint of hand 07/26/2020   Pacemaker 07/26/2020   Rib pain on left side 06/09/2020   Decreased pulses in feet 06/09/2020   Erectile dysfunction 03/03/2017   Chronic headaches 02/13/2017   Snoring 02/15/2015   Elevated liver enzymes 09/13/2014   Dupuytren's contracture of both hands 09/02/2013   Stiffness of right hand joint 05/07/2013   Gout 12/27/2011   Essential hypertension 01/12/2011   Complete heart block (HCC) - PPM 01/12/2011   Hyperglycemia 03/02/2009   HYPERPLASIA PROSTATE UNS W/O UR OBST & OTH LUTS 08/13/2008   Hyperlipidemia 02/17/2008   PAROXYSMAL SUPRAVENTRICULAR TACHYCARDIA 02/17/2008    PCP: Geofm Glade PARAS, MD  REFERRING PROVIDER: Corey, Evan  S, MD  REFERRING DIAG: Chronic bilateral low back pain without sciatica; Chronic SI joint pain; Chronic left shoulder pain  Rationale for Evaluation and Treatment: Rehabilitation  THERAPY DIAG:  Other low back pain  Muscle weakness (generalized)  ONSET DATE: Chronic   SUBJECTIVE:          SUBJECTIVE STATEMENT: Patient reports he has been having a lot of pain in the mornings, and during the day he can be more active. He has noticed that sleeping on his back is painful but if he sleeps in his recliner then he is more comfortable. He reports most of the pain is  on the right side.   Eval: Patient reports he became inactive years ago and they found that his SIJ are screwed up and they get better with shots. He has had a fall recently and had a new pacemaker put in. He reports that if he walks extended periods of time then he will be hurting right around the back of the hips. He states he fell while in the hospital and with his new pacemaker he is not allowed to lift more that 10 lbs with the left arm. He does report feeling slightly off balance. He always uses a hand rail when going up and down stairs.   PERTINENT HISTORY:  See PMH above  PAIN:  Are you having pain? Yes:  NPRS scale: 3/10 currently Pain location: Bilateral lower back and SIJ region Pain description: Stiff, locked-up Aggravating factors: Worse in morning, walking or standing extended periods, anything that requires bending Relieving factors: Patches or sprays, medication  PRECAUTIONS: Fall  WEIGHT BEARING RESTRICTIONS: No  FALLS:  Has patient fallen in last 6 months? Yes. Number of falls 1  PATIENT GOALS: Get more flexible and move better.    OBJECTIVE:  Note: Objective measures were completed at Evaluation unless otherwise noted. PATIENT SURVEYS:  Modified Oswestry: 11/50 (22% disability)  07/16/2024: 10/50 (20% disability)  MUSCLE LENGTH: Limitation with hamstring, quad and hip flexibility  POSTURE:  Rounded shoulder posture, remains in slightly knee flexed/crouched position, decreased lumbar lordosis  PALPATION: Tender to palpation bilateral lumbar paraspinals, PSIS and SIJ region, piriformis region  Lumbar CPA hypomobile throughout  LUMBAR ROM:   AROM eval  Flexion Limited and tight  Extension Limited and painful  Right lateral flexion   Left lateral flexion   Right rotation Limited and tight  Left rotation Limited and tight   (Blank rows = not tested)  LOWER EXTREMITY ROM:    Hip PROM grossly limited in all directions with tightness reported at end  ranges  LOWER EXTREMITY MMT:    MMT Right eval Left eval Rt / Lt 07/16/2024  Hip flexion 4- 4-   Hip extension 3- 3- 3- / 3-  Hip abduction 3 3 3  / 3  Hip adduction     Hip internal rotation     Hip external rotation     Knee flexion 4 4 4  / 4  Knee extension 4 4 4  / 4  Ankle dorsiflexion     Ankle plantarflexion     Ankle inversion     Ankle eversion      (Blank rows = not tested)  FUNCTIONAL TESTS:  5 times sit to stand: 15 seconds  07/16/2024: 15 seconds  Romberg stance: patient demonstrates greater difficulty with eyes closed   GAIT: Assistive device utilized: None Level of assistance: Complete Independence Comments: Remains in relative knee flexed/crouched, toe out, unsteady gait   TREATMENT [facility] Adult  PT Treatment:                                                DATE: 07/16/2024 Recumbent bike L4 x 8 min to improve endurance and workload capacity IASTM for bilateral lumbar paraspinals and gluteal/piriformis region using theragun Prone lumbar PA mobs Supine LAD using belt for right LE Supine passive hamstring stretch each Supine passive knee to chest stretch each Supine passive piriformis stretch each LTR Modified thomas stretch with passive knee flexion each  PATIENT EDUCATION:  Education details: POC extension, HEP Person educated: Patient Education method: Programmer, multimedia, Demonstration, Tactile cues, Verbal cues Education comprehension: verbalized understanding, returned demonstration, verbal cues required, tactile cues required, and needs further education  HOME EXERCISE PROGRAM: Access Code: [email or web address]    ASSESSMENT: CLINICAL IMPRESSION: Patient tolerated therapy well with no adverse effects. He continues to report lower back pain that is worse in the morning and patient states is more positionally related while sleeping. He does report slight improvement in functional status since evaluation but does continue to exhibit gross flexibility deficits and weakness  of the core and hips that is likely contributing to continued pain and impacting his functional ability. Therapy incorporated more manual therapy to reduce muscle tension/tightness and reduce low back pain. No changes made to his HEP this visit. Patient would benefit from continued skilled PT to progress mobility and strength in order to reduce pain and maximize functional ability, so will extend his PT POC for 8 more weeks.   Eval: Patient is a 78 y.o. male who was seen today for physical therapy evaluation and treatment for chronic lower back and pelvic pain, and recent fall.   OBJECTIVE IMPAIRMENTS: Abnormal gait, decreased activity tolerance, decreased balance, decreased ROM, decreased strength, impaired flexibility, postural dysfunction, and pain.   ACTIVITY LIMITATIONS: carrying, lifting, bending, standing, stairs, and locomotion level  PARTICIPATION LIMITATIONS: meal prep, cleaning, shopping, and community activity  PERSONAL FACTORS: Fitness, Past/current experiences, and Time since onset of injury/illness/exacerbation are also affecting patient's functional outcome.    GOALS: Goals reviewed with patient? Yes  SHORT TERM GOALS: Target date: 06/04/2024  Patient will be I with initial HEP in order to progress with therapy. Baseline: HEP provided at eval 06/11/2024: independent Goal status: MET  LONG TERM GOALS: Target date: 09/10/2024  Patient will be I with final HEP to maintain progress from PT. Baseline: HEP provided at eval 07/16/2024: progressing Goal status: ONGOING  2.  Patient will report Modified Oswestry </= 5/50 (10% disability) in order to indicate an improvement in their functional status Baseline: 11/50 (22%) 07/16/2024: 10/50 (20% disability) Goal status: ONGOING  3.  Patient will demonstrate 5xSTS </= 12 seconds to indicate improvement in strength and reduce fall risk Baseline: 15 seconds 07/16/2024: 15 seconds Goal status: ONGOING  4.  Patient will be able to  walk for extended periods without need to stop due to pain in order to improve his functional ability Baseline: patient reports frequent stops while walking due to pain 07/16/2024: reports continued limitation Goal status: ONGOING   PLAN: PT FREQUENCY: 1x/week  PT DURATION: 8 weeks  PLANNED INTERVENTIONS: 97164- PT Re-evaluation, 97750- Physical Performance Testing, 97110-Therapeutic exercises, 97530- Therapeutic activity, 97112- Neuromuscular re-education, 97535- Self Care, 02859- Manual therapy, 20560 (1-2 muscles), 20561 (3+ muscles)- Dry Needling, Patient/Family education, Balance training, Stair training, Taping, Joint mobilization, Joint manipulation, Spinal manipulation,  Spinal mobilization, Cryotherapy, and Moist heat.  PLAN FOR NEXT SESSION: Review HEP and progress PRN, continue to work on LE and hip flexibility, progress LE, core, hip strengthening to improve activity tolerance, incorporate balance and stability exercises   Elaine Daring, PT, DPT, LAT, ATC 07/16/24  12:11 PM Phone: [phone] Fax: [phone]

## 2024-07-16 NOTE — Telephone Encounter (Signed)
 Patient reports he was asleep and asymptomatic during event. Patient has felt well staying hydrated and no recent sickness.  Advised pt I will forward to Dr. Nancey and if any recommends are advise someone will call back. Pt agreeable to plan.

## 2024-07-17 ENCOUNTER — Ambulatory Visit: Payer: Self-pay | Admitting: Cardiovascular Disease

## 2024-07-21 NOTE — Progress Notes (Signed)
 Electrophysiology Office Note:   Date:  07/22/2024  ID:  [name], DOB 01-25-1946, MRN [id number]  Primary Cardiologist: None Electrophysiologist: [name] Furbish, MD      History of Present Illness:   [name] is a 78 y.o. male with h/o complete heart block s/p pacemaker, non-obstructive CAD, HTN, HLD, atrial flutter who is being seen today for evaluation for Watchman device implant at the request of Dr. Furbish.   Discussed the use of AI scribe software for clinical note transcription with the patient, who gave verbal consent to proceed.  History of Present Illness [name] is a 78 year old male who presents for follow-up and evaluation for a Watchman device placement. Patient was initially referred by Dr. Mealor for Watchman implant. After our last visit, patient elected to proceed with placement of Watchman device. He underwent echocardiogram which showed low EF. Underwent upgrade to CRT. Repeat echo with significant improvement in LVEF.  Since the upgrade, he has not noticed any significant changes in how he feels, although a recent evaluation showed an improvement in heart function from 30% to 45%.  He is currently taking Eliquis  and experiences significant bruising and bleeding with minor trauma, impacting his quality of life. He has had no major bleeding events in the interim. He has undergone dental work and dermatological procedures with bleeding issues. He notes that his skin tears easily and that he experiences significant bruising and bleeding with minor trauma. He is eager to find an alternative to blood thinners due to the impact on his quality of life.  Review of systems complete and found to be negative unless listed in HPI.   EP Information / Studies Reviewed:    EKG is not ordered today. EKG from 07/09/24 reviewed which showed AS-BiV paced rhythm.  EKG Interpretation Date/Time:  Tuesday July 22 2024 13:21:13 EDT Ventricular Rate:  99 PR  Interval:  148 QRS Duration:  154 QT Interval:  424 QTC Calculation: 544 R Axis:   -38  Text Interpretation: Atrial-sensed ventricular-paced rhythm When compared with ECG of 09-Jul-2024 11:24,  Similar findings Confirmed by Kennyth Chew [phone]) on 07/22/2024 1:30:24 PM  Echo 07/09/24:  1. Compared to 02/06/24 LV function improved.   2. Left ventricular ejection fraction, by estimation, is 45 to 50%. The  left ventricle has mildly decreased function. The left ventricle  demonstrates global hypokinesis. There is moderate concentric left  ventricular hypertrophy. Left ventricular  diastolic parameters are consistent with Grade I diastolic dysfunction  (impaired relaxation).   3. Right ventricular systolic function is normal. The right ventricular  size is normal.   4. A small pericardial effusion is present.   5. The mitral valve is normal in structure. No evidence of mitral valve  regurgitation. No evidence of mitral stenosis.   6. The aortic valve is tricuspid. Aortic valve regurgitation is not  visualized. Aortic valve sclerosis is present, with no evidence of aortic  valve stenosis.   7. There is borderline dilatation of the aortic root, measuring 39 mm.   8. The inferior vena cava is normal in size with greater than 50%  respiratory variability, suggesting right atrial pressure of 3 mmHg.   Risk Assessment/Calculations:    CHA2DS2-VASc Score = 4   This indicates a 4.8% annual risk of stroke. The patient's score is based upon: CHF History: 0 HTN History: 1 Diabetes History: 0 Stroke History: 0 Vascular Disease History: 1 Age Score: 2 Gender Score: 0  Physical Exam:   VS:  BP 118/70   Pulse 99   Ht 6' 5 (1.956 m)   Wt 244 lb 8 oz (110.9 kg)   SpO2 94%   BMI 28.99 kg/m    Wt Readings from Last 3 Encounters:  07/22/24 244 lb 8 oz (110.9 kg)  07/09/24 236 lb (107 kg)  06/04/24 242 lb (109.8 kg)     GEN: Well nourished, well developed in no acute  distress NECK: No JVD CARDIAC: Normal rate, regular. Well healed left chest pacer pocket. RESPIRATORY:  Clear to auscultation without rales, wheezing or rhonchi  ABDOMEN: Soft, non-distended EXTREMITIES:  No edema; No deformity  Skin: Diffuse ecchymosis. Large skin tears on upper extremities. Healed skin tears over knees bilaterally.  ASSESSMENT AND PLAN:   I have seen [name] in the office today who is being considered for a Watchman left atrial appendage closure device. I believe they will benefit from this procedure given their history of atrial fibrillation, CHA2DS2-VASc score of 4 and unadjusted ischemic stroke rate of 4.8% per year. Unfortunately, the patient is not felt to be a long term anticoagulation candidate secondary to diffuse, recurrent skin bleeding. The patient has had numerous, large skin tears associated with prolonged bleeding and resulting in delayed wound healing, increasing his risk of infection. The patient's chart has been reviewed and I feel that they would be a candidate for short term oral anticoagulation after Watchman implant.   It is my belief that after undergoing a LAA closure procedure, [name] will not need long term anticoagulation which eliminates anticoagulation side effects and major bleeding risk.   Procedural risks for the Watchman implant have been reviewed with the patient including a 0.5% risk of stroke, <1% risk of perforation and <1% risk of device embolization. Other risks include bleeding, vascular damage, tamponade, worsening renal function, and death. The patient understands these risk and wishes to proceed.    The published clinical data on the safety and effectiveness of WATCHMAN include but are not limited to the following: - Holmes DR, Jess BEARD, Sick P et al. for the PROTECT AF Investigators. Percutaneous closure of the left atrial appendage versus warfarin therapy for prevention of stroke in patients with atrial fibrillation: a  randomised non-inferiority trial. Lancet 2009; 374: 534-42. GLENWOOD Jess BEARD, Doshi SK, Jonita VEAR Satchel D et al. on behalf of the PROTECT AF Investigators. Percutaneous Left Atrial Appendage Closure for Stroke Prophylaxis in Patients With Atrial Fibrillation 2.3-Year Follow-up of the PROTECT AF (Watchman Left Atrial Appendage System for Embolic Protection in Patients With Atrial Fibrillation) Trial. Circulation 2013; 127:720-729. - Alli O, Doshi S,  Kar S, Reddy VY, Sievert H et al. Quality of Life Assessment in the Randomized PROTECT AF (Percutaneous Closure of the Left Atrial Appendage Versus Warfarin Therapy for Prevention of Stroke in Patients With Atrial Fibrillation) Trial of Patients at Risk for Stroke With Nonvalvular Atrial Fibrillation. J Am Coll Cardiol 2013; 61:1790-8. GLENWOOD Satchel DR, Archer RAMAN, Price M, Whisenant B, Sievert H, Doshi S, Huber K, Reddy V. Prospective randomized evaluation of the Watchman left atrial appendage Device in patients with atrial fibrillation versus long-term warfarin therapy; the PREVAIL trial. Journal of the [employer] of Cardiology, Vol. 4, No. 1, 2014, 1-11. - Kar S, Doshi SK, Sadhu A, Horton R, Osorio J et al. Primary outcome evaluation of a next-generation left atrial appendage closure device: results from the PINNACLE FLX trial. Circulation 2021;143(18)1754-1762.    HAS-BLED score 3 Hypertension Yes  Abnormal renal and liver function (Dialysis, transplant, Cr >2.26 mg/dL /Cirrhosis or Bilirubin >2x Normal or AST/ALT/AP >3x Normal) No  Stroke No  Bleeding Yes  Labile INR (Unstable/high INR) No  Elderly (>65) Yes  Drugs or alcohol (>= 8 drinks/week, anti-plt or NSAID) No   CHA2DS2-VASc Score = 4  The patient's score is based upon: CHF History: 0 HTN History: 1 Diabetes History: 0 Stroke History: 0 Vascular Disease History: 1 Age Score: 2 Gender Score: 0       ASSESSMENT AND PLAN: After today's visit with the patient which was dedicated solely for  shared decision making visit regarding LAA closure device, the patient decided to proceed with the LAA appendage closure procedure scheduled to be done in the near future at [facility]. LAA anatomy on CT scan appears amenable to closure with Watchman device. Planned for next Thursday.  Paroxysmal Atrial Flutter: Device detected. The patient's CHA2DS2-VASc score is 4, indicating a 4.8% annual risk of stroke.    Secondary Hypercoagulable State (ICD10:  D68.69) The patient is at significant risk for stroke/thromboembolism based upon his CHA2DS2-VASc Score of 4.  Continue Apixaban  (Eliquis ).    Signed, Fonda Kitty, MD

## 2024-07-21 NOTE — H&P (View-Only) (Signed)
 Electrophysiology Office Note:   Date:  07/22/2024  ID:  [name], DOB 01-25-1946, MRN [id number]  Primary Cardiologist: None Electrophysiologist: Jonathan FORBES Furbish, MD      History of Present Illness:   [name] is a 78 y.o. male with h/o complete heart block s/p pacemaker, non-obstructive CAD, HTN, HLD, atrial flutter who is being seen today for evaluation for Watchman device implant at the request of [name].   Discussed the use of AI scribe software for clinical note transcription with the patient, who gave verbal consent to proceed.  History of Present Illness [name] is a 78 year old male who presents for follow-up and evaluation for a Watchman device placement. Patient was initially referred by [name] for Watchman implant. After our last visit, patient elected to proceed with placement of Watchman device. Jonathan underwent echocardiogram which showed low EF. Underwent upgrade to CRT. Repeat echo with significant improvement in LVEF.  Since the upgrade, Jonathan has not noticed any significant changes in how Jonathan feels, although a recent evaluation showed an improvement in heart function from 30% to 45%.  Jonathan is currently taking Eliquis  and experiences significant bruising and bleeding with minor trauma, impacting his quality of life. Jonathan has had no major bleeding events in the interim. Jonathan has undergone dental work and dermatological procedures with bleeding issues. Jonathan notes that his skin tears easily and that Jonathan experiences significant bruising and bleeding with minor trauma. Jonathan is eager to find an alternative to blood thinners due to the impact on his quality of life.  Review of systems complete and found to be negative unless listed in HPI.   EP Information / Studies Reviewed:    EKG is not ordered today. EKG from 07/09/24 reviewed which showed AS-BiV paced rhythm.  EKG Interpretation Date/Time:  Tuesday July 22 2024 13:21:13 EDT Ventricular Rate:  99 PR  Interval:  148 QRS Duration:  154 QT Interval:  424 QTC Calculation: 544 R Axis:   -38  Text Interpretation: Atrial-sensed ventricular-paced rhythm When compared with ECG of 09-Jul-2024 11:24,  Similar findings Confirmed by [name] [phone]) on 07/22/2024 1:30:24 PM  Echo 07/09/24:  1. Compared to 02/06/24 LV function improved.   2. Left ventricular ejection fraction, by estimation, is 45 to 50%. The  left ventricle has mildly decreased function. The left ventricle  demonstrates global hypokinesis. There is moderate concentric left  ventricular hypertrophy. Left ventricular  diastolic parameters are consistent with Grade I diastolic dysfunction  (impaired relaxation).   3. Right ventricular systolic function is normal. The right ventricular  size is normal.   4. A small pericardial effusion is present.   5. The mitral valve is normal in structure. No evidence of mitral valve  regurgitation. No evidence of mitral stenosis.   6. The aortic valve is tricuspid. Aortic valve regurgitation is not  visualized. Aortic valve sclerosis is present, with no evidence of aortic  valve stenosis.   7. There is borderline dilatation of the aortic root, measuring 39 mm.   8. The inferior vena cava is normal in size with greater than 50%  respiratory variability, suggesting right atrial pressure of 3 mmHg.   Risk Assessment/Calculations:    CHA2DS2-VASc Score = 4   This indicates a 4.8% annual risk of stroke. The patient's score is based upon: CHF History: 0 HTN History: 1 Diabetes History: 0 Stroke History: 0 Vascular Disease History: 1 Age Score: 2 Gender Score: 0  Physical Exam:   VS:  BP 118/70   Pulse 99   Ht 6' 5 (1.956 m)   Wt 244 lb 8 oz (110.9 kg)   SpO2 94%   BMI 28.99 kg/m    Wt Readings from Last 3 Encounters:  07/22/24 244 lb 8 oz (110.9 kg)  07/09/24 236 lb (107 kg)  06/04/24 242 lb (109.8 kg)     GEN: Well nourished, well developed in no acute  distress NECK: No JVD CARDIAC: Normal rate, regular. Well healed left chest pacer pocket. RESPIRATORY:  Clear to auscultation without rales, wheezing or rhonchi  ABDOMEN: Soft, non-distended EXTREMITIES:  No edema; No deformity  Skin: Diffuse ecchymosis. Large skin tears on upper extremities. Healed skin tears over knees bilaterally.  ASSESSMENT AND PLAN:   I have seen [name] in the office today who is being considered for a Watchman left atrial appendage closure device. I believe they will benefit from this procedure given their history of atrial fibrillation, CHA2DS2-VASc score of 4 and unadjusted ischemic stroke rate of 4.8% per year. Unfortunately, the patient is not felt to be a long term anticoagulation candidate secondary to diffuse, recurrent skin bleeding. The patient has had numerous, large skin tears associated with prolonged bleeding and resulting in delayed wound healing, increasing his risk of infection. The patient's chart has been reviewed and I feel that they would be a candidate for short term oral anticoagulation after Watchman implant.   It is my belief that after undergoing a LAA closure procedure, [name] will not need long term anticoagulation which eliminates anticoagulation side effects and major bleeding risk.   Procedural risks for the Watchman implant have been reviewed with the patient including a 0.5% risk of stroke, <1% risk of perforation and <1% risk of device embolization. Other risks include bleeding, vascular damage, tamponade, worsening renal function, and death. The patient understands these risk and wishes to proceed.    The published clinical data on the safety and effectiveness of WATCHMAN include but are not limited to the following: - Holmes DR, Jess BEARD, Sick P et al. for the PROTECT AF Investigators. Percutaneous closure of the left atrial appendage versus warfarin therapy for prevention of stroke in patients with atrial fibrillation: a  randomised non-inferiority trial. Lancet 2009; 374: 534-42. GLENWOOD Jess BEARD, Doshi SK, Jonita VEAR Satchel D et al. on behalf of the PROTECT AF Investigators. Percutaneous Left Atrial Appendage Closure for Stroke Prophylaxis in Patients With Atrial Fibrillation 2.3-Year Follow-up of the PROTECT AF (Watchman Left Atrial Appendage System for Embolic Protection in Patients With Atrial Fibrillation) Trial. Circulation 2013; 127:720-729. - Alli O, Doshi S,  Kar S, Reddy VY, Sievert H et al. Quality of Life Assessment in the Randomized PROTECT AF (Percutaneous Closure of the Left Atrial Appendage Versus Warfarin Therapy for Prevention of Stroke in Patients With Atrial Fibrillation) Trial of Patients at Risk for Stroke With Nonvalvular Atrial Fibrillation. J Am Coll Cardiol 2013; 61:1790-8. GLENWOOD Satchel DR, Archer RAMAN, Price M, Whisenant B, Sievert H, Doshi S, Huber K, Reddy V. Prospective randomized evaluation of the Watchman left atrial appendage Device in patients with atrial fibrillation versus long-term warfarin therapy; the PREVAIL trial. Journal of the [employer] of Cardiology, Vol. 4, No. 1, 2014, 1-11. - Kar S, Doshi SK, Sadhu A, Horton R, Osorio J et al. Primary outcome evaluation of a next-generation left atrial appendage closure device: results from the PINNACLE FLX trial. Circulation 2021;143(18)1754-1762.    HAS-BLED score 3 Hypertension Yes  Abnormal renal and liver function (Dialysis, transplant, Cr >2.26 mg/dL /Cirrhosis or Bilirubin >2x Normal or AST/ALT/AP >3x Normal) No  Stroke No  Bleeding Yes  Labile INR (Unstable/high INR) No  Elderly (>65) Yes  Drugs or alcohol (>= 8 drinks/week, anti-plt or NSAID) No   CHA2DS2-VASc Score = 4  The patient's score is based upon: CHF History: 0 HTN History: 1 Diabetes History: 0 Stroke History: 0 Vascular Disease History: 1 Age Score: 2 Gender Score: 0       ASSESSMENT AND PLAN: After today's visit with the patient which was dedicated solely for  shared decision making visit regarding LAA closure device, the patient decided to proceed with the LAA appendage closure procedure scheduled to be done in the near future at [facility]. LAA anatomy on CT scan appears amenable to closure with Watchman device. Planned for next Thursday.  Paroxysmal Atrial Flutter: Device detected. The patient's CHA2DS2-VASc score is 4, indicating a 4.8% annual risk of stroke.    Secondary Hypercoagulable State (ICD10:  D68.69) The patient is at significant risk for stroke/thromboembolism based upon his CHA2DS2-VASc Score of 4.  Continue Apixaban  (Eliquis ).    Signed, Fonda Kitty, MD

## 2024-07-22 ENCOUNTER — Ambulatory Visit: Attending: Cardiology | Admitting: Cardiology

## 2024-07-22 ENCOUNTER — Encounter: Payer: Self-pay | Admitting: Cardiology

## 2024-07-22 VITALS — BP 118/70 | HR 99 | Ht 77.0 in | Wt 244.5 lb

## 2024-07-22 DIAGNOSIS — D6869 Other thrombophilia: Secondary | ICD-10-CM | POA: Diagnosis not present

## 2024-07-22 DIAGNOSIS — I480 Paroxysmal atrial fibrillation: Secondary | ICD-10-CM | POA: Diagnosis not present

## 2024-07-22 DIAGNOSIS — Z01812 Encounter for preprocedural laboratory examination: Secondary | ICD-10-CM | POA: Diagnosis not present

## 2024-07-22 NOTE — Patient Instructions (Addendum)
 Medication Instructions:  Your physician recommends that you continue on your current medications as directed. Please refer to the Current Medication list given to you today.  *If you need a refill on your cardiac medications before your next appointment, please call your pharmacy*  Lab Work: CBC and BMET today  If you have labs (blood work) drawn today and your tests are completely normal, you will receive your results only by: MyChart Message (if you have MyChart) OR A paper copy in the mail If you have any lab test that is abnormal or we need to change your treatment, we will call you to review the results.  Testing/Procedures: None ordered.   Follow-Up: At [facility], you and your health needs are our priority.  As part of our continuing mission to provide you with exceptional heart care, our providers are all part of one team.  This team includes your primary Cardiologist (physician) and Advanced Practice Providers or APPs (Physician Assistants and Nurse Practitioners) who all work together to provide you with the care you need, when you need it.  Your next appointment:   To be scheduled

## 2024-07-23 ENCOUNTER — Encounter: Payer: Self-pay | Admitting: Physical Therapy

## 2024-07-23 ENCOUNTER — Other Ambulatory Visit: Payer: Self-pay

## 2024-07-23 ENCOUNTER — Ambulatory Visit: Admitting: Physical Therapy

## 2024-07-23 DIAGNOSIS — M6281 Muscle weakness (generalized): Secondary | ICD-10-CM

## 2024-07-23 DIAGNOSIS — M5459 Other low back pain: Secondary | ICD-10-CM | POA: Diagnosis not present

## 2024-07-23 LAB — CBC
Hematocrit: 41.8 % (ref 37.5–51.0)
Hemoglobin: 13.9 g/dL (ref 13.0–17.7)
MCH: 35.7 pg — ABNORMAL HIGH (ref 26.6–33.0)
MCHC: 33.3 g/dL (ref 31.5–35.7)
MCV: 108 fL — ABNORMAL HIGH (ref 79–97)
Platelets: 188 x10E3/uL (ref 150–450)
RBC: 3.89 x10E6/uL — ABNORMAL LOW (ref 4.14–5.80)
RDW: 13 % (ref 11.6–15.4)
WBC: 6.6 x10E3/uL (ref 3.4–10.8)

## 2024-07-23 LAB — BASIC METABOLIC PANEL WITH GFR
BUN/Creatinine Ratio: 12 (ref 10–24)
BUN: 13 mg/dL (ref 8–27)
CO2: 23 mmol/L (ref 20–29)
Calcium: 10.1 mg/dL (ref 8.6–10.2)
Chloride: 99 mmol/L (ref 96–106)
Creatinine, Ser: 1.08 mg/dL (ref 0.76–1.27)
Glucose: 100 mg/dL — ABNORMAL HIGH (ref 70–99)
Potassium: 4.4 mmol/L (ref 3.5–5.2)
Sodium: 140 mmol/L (ref 134–144)
eGFR: 71 mL/min/1.73 (ref >59)

## 2024-07-23 NOTE — Therapy (Signed)
 OUTPATIENT PHYSICAL THERAPY TREATMENT   Patient Name: [name] MRN: [id number] DOB:1945-12-27, 78 y.o., male Today's Date: 07/23/2024   END OF SESSION:  PT End of Session - 07/23/24 1334     Visit Number 5    Number of Visits 12    Date for PT Re-Evaluation 09/10/24    Authorization Type [employer]    PT Start Time 1345    PT Stop Time 1429    PT Time Calculation (min) 44 min    Activity Tolerance Patient tolerated treatment well    Behavior During Therapy WFL for tasks assessed/performed              Past Medical History:  Diagnosis Date   Complete heart block (HCC)    S/P  PPM by Dr Kelsie   Coronary artery disease    Non-obstructive. Pt had left heart catheterization in August 2009 showing a 30% mid-circumflex lesion, otherwise coronaries were clean angiographically   Hyperlipidemia    Hypertension    OSA (obstructive sleep apnea)    Probable; study never scheduled   Pacemaker 04/04/2024   Past Surgical History:  Procedure Laterality Date   BIV UPGRADE N/A 04/04/2024   Procedure: BIV UPGRADE;  Surgeon: Mealor, Eulas BRAVO, MD;  Location: [facility] INVASIVE CV LAB;  Service: Cardiovascular;  Laterality: N/A;   CARDIAC CATHETERIZATION  1993, 2009   2009- negative   COLONOSCOPY  2011   negative, [facility]   LEFT HEART CATH AND CORONARY ANGIOGRAPHY N/A 03/04/2024   Procedure: LEFT HEART CATH AND CORONARY ANGIOGRAPHY;  Surgeon: Wonda Sharper, MD;  Location: [facility] INVASIVE CV LAB;  Service: Cardiovascular;  Laterality: N/A;   PACEMAKER INSERTION  01/13/11   MDT by MILUS for complete heart block   PPM GENERATOR CHANGEOUT N/A 04/04/2024   Procedure: PPM GENERATOR CHANGEOUT;  Surgeon: Nancey, Eulas BRAVO, MD;  Location: [facility] INVASIVE CV LAB;  Service: Cardiovascular;  Laterality: N/A;   Patient Active Problem List   Diagnosis Date Noted   Pain in right lower leg 05/05/2024   Multiple skin tears 03/06/2024   CAD in native artery 02/14/2024   CKD (chronic kidney  disease) stage 3, GFR 30-59 ml/min (HCC) 02/10/2024   Skin abrasion 08/30/2023   Cellulitis of left lower extremity 08/30/2023   Elevated PSA 03/14/2023   Elevated LFTs 03/14/2023   Atrial flutter (HCC) 02/13/2023   Chronic left SI joint pain 12/22/2022   DOE (dyspnea on exertion) 12/22/2022   Chest pain, atypical 10/10/2022   Upper airway cough syndrome 10/10/2022   Chronic cough 02/01/2022   Chronic right SI joint pain 05/11/2021   Umbilical hernia without obstruction or gangrene 02/02/2021   Hepatic steatosis 08/05/2020   Aortic atherosclerosis (HCC) 08/04/2020   Complex regional pain syndrome type I of the upper limb 07/26/2020   Flexion contracture of joint of hand 07/26/2020   Pacemaker 07/26/2020   Rib pain on left side 06/09/2020   Decreased pulses in feet 06/09/2020   Erectile dysfunction 03/03/2017   Chronic headaches 02/13/2017   Snoring 02/15/2015   Elevated liver enzymes 09/13/2014   Dupuytren's contracture of both hands 09/02/2013   Stiffness of right hand joint 05/07/2013   Gout 12/27/2011   Essential hypertension 01/12/2011   Complete heart block (HCC) - PPM 01/12/2011   Hyperglycemia 03/02/2009   HYPERPLASIA PROSTATE UNS W/O UR OBST & OTH LUTS 08/13/2008   Hyperlipidemia 02/17/2008   PAROXYSMAL SUPRAVENTRICULAR TACHYCARDIA 02/17/2008    PCP: Geofm Glade PARAS, MD  REFERRING PROVIDER: Joane,  Artist RAMAN, MD  REFERRING DIAG: Chronic bilateral low back pain without sciatica; Chronic SI joint pain; Chronic left shoulder pain  Rationale for Evaluation and Treatment: Rehabilitation  THERAPY DIAG:  Other low back pain  Muscle weakness (generalized)  ONSET DATE: Chronic   SUBJECTIVE:          SUBJECTIVE STATEMENT: Patient reports he has been sleeping in the chair more and states he doesn't wake up in pain like when lying flat on his back. He reports that he was sore following last visit but then that next day he felt much better. He reports more pain in right  lower back and hip region this visit.  Eval: Patient reports he became inactive years ago and they found that his SIJ are screwed up and they get better with shots. He has had a fall recently and had a new pacemaker put in. He reports that if he walks extended periods of time then he will be hurting right around the back of the hips. He states he fell while in the hospital and with his new pacemaker he is not allowed to lift more that 10 lbs with the left arm. He does report feeling slightly off balance. He always uses a hand rail when going up and down stairs.   PERTINENT HISTORY:  See PMH above  PAIN:  Are you having pain? Yes:  NPRS scale: 3/10 currently Pain location: Bilateral lower back and SIJ region Pain description: Stiff, locked-up Aggravating factors: Worse in morning, walking or standing extended periods, anything that requires bending Relieving factors: Patches or sprays, medication  PRECAUTIONS: Fall  WEIGHT BEARING RESTRICTIONS: No  FALLS:  Has patient fallen in last 6 months? Yes. Number of falls 1  PATIENT GOALS: Get more flexible and move better.    OBJECTIVE:  Note: Objective measures were completed at Evaluation unless otherwise noted. PATIENT SURVEYS:  Modified Oswestry: 11/50 (22% disability)  07/16/2024: 10/50 (20% disability)  MUSCLE LENGTH: Limitation with hamstring, quad and hip flexibility  POSTURE:  Rounded shoulder posture, remains in slightly knee flexed/crouched position, decreased lumbar lordosis  PALPATION: Tender to palpation bilateral lumbar paraspinals, PSIS and SIJ region, piriformis region  Lumbar CPA hypomobile throughout  LUMBAR ROM:   AROM eval  Flexion Limited and tight  Extension Limited and painful  Right lateral flexion   Left lateral flexion   Right rotation Limited and tight  Left rotation Limited and tight   (Blank rows = not tested)  LOWER EXTREMITY ROM:    Hip PROM grossly limited in all directions with tightness  reported at end ranges  LOWER EXTREMITY MMT:    MMT Right eval Left eval Rt / Lt 07/16/2024  Hip flexion 4- 4-   Hip extension 3- 3- 3- / 3-  Hip abduction 3 3 3  / 3  Hip adduction     Hip internal rotation     Hip external rotation     Knee flexion 4 4 4  / 4  Knee extension 4 4 4  / 4  Ankle dorsiflexion     Ankle plantarflexion     Ankle inversion     Ankle eversion      (Blank rows = not tested)  FUNCTIONAL TESTS:  5 times sit to stand: 15 seconds  07/16/2024: 15 seconds  Romberg stance: patient demonstrates greater difficulty with eyes closed   GAIT: Assistive device utilized: None Level of assistance: Complete Independence Comments: Remains in relative knee flexed/crouched, toe out, unsteady gait   TREATMENT [facility]  Adult PT Treatment:                                                DATE: 07/23/2024 Recumbent bike L4 x 8 min to improve endurance and workload capacity IASTM for bilateral lumbar paraspinals and gluteal/piriformis region using theragun Prone lumbar PA mobs Prone quad stretch Supine LAD using belt for bilateral LE Supine passive hamstring stretch each Supine passive knee to chest stretch each Supine passive piriformis stretch each  PATIENT EDUCATION:  Education details: HEP Person educated: Patient Education method: Programmer, multimedia, Facilities manager, Actor cues, Verbal cues Education comprehension: verbalized understanding, returned demonstration, verbal cues required, tactile cues required, and needs further education  HOME EXERCISE PROGRAM: Access Code: [email or web address]    ASSESSMENT: CLINICAL IMPRESSION: Patient tolerated therapy well with no adverse effects. Therapy focused primarily on manual for the lumbar spine and hips in order to reduce muscular tension and improve mobility. Majority of his complaints revolve around stiffness and muscle tightness, and he does report improvement in symptoms following therapy. No changes to his HEP this visit. Patient would  benefit from continued skilled PT to progress mobility and strength in order to reduce pain and maximize functional ability.   Eval: Patient is a 78 y.o. male who was seen today for physical therapy evaluation and treatment for chronic lower back and pelvic pain, and recent fall.   OBJECTIVE IMPAIRMENTS: Abnormal gait, decreased activity tolerance, decreased balance, decreased ROM, decreased strength, impaired flexibility, postural dysfunction, and pain.   ACTIVITY LIMITATIONS: carrying, lifting, bending, standing, stairs, and locomotion level  PARTICIPATION LIMITATIONS: meal prep, cleaning, shopping, and community activity  PERSONAL FACTORS: Fitness, Past/current experiences, and Time since onset of injury/illness/exacerbation are also affecting patient's functional outcome.    GOALS: Goals reviewed with patient? Yes  SHORT TERM GOALS: Target date: 06/04/2024  Patient will be I with initial HEP in order to progress with therapy. Baseline: HEP provided at eval 06/11/2024: independent Goal status: MET  LONG TERM GOALS: Target date: 09/10/2024  Patient will be I with final HEP to maintain progress from PT. Baseline: HEP provided at eval 07/16/2024: progressing Goal status: ONGOING  2.  Patient will report Modified Oswestry </= 5/50 (10% disability) in order to indicate an improvement in their functional status Baseline: 11/50 (22%) 07/16/2024: 10/50 (20% disability) Goal status: ONGOING  3.  Patient will demonstrate 5xSTS </= 12 seconds to indicate improvement in strength and reduce fall risk Baseline: 15 seconds 07/16/2024: 15 seconds Goal status: ONGOING  4.  Patient will be able to walk for extended periods without need to stop due to pain in order to improve his functional ability Baseline: patient reports frequent stops while walking due to pain 07/16/2024: reports continued limitation Goal status: ONGOING   PLAN: PT FREQUENCY: 1x/week  PT DURATION: 8 weeks  PLANNED  INTERVENTIONS: 97164- PT Re-evaluation, 97750- Physical Performance Testing, 97110-Therapeutic exercises, 97530- Therapeutic activity, 97112- Neuromuscular re-education, 97535- Self Care, 02859- Manual therapy, 20560 (1-2 muscles), 20561 (3+ muscles)- Dry Needling, Patient/Family education, Balance training, Stair training, Taping, Joint mobilization, Joint manipulation, Spinal manipulation, Spinal mobilization, Cryotherapy, and Moist heat.  PLAN FOR NEXT SESSION: Review HEP and progress PRN, continue to work on LE and hip flexibility, progress LE, core, hip strengthening to improve activity tolerance, incorporate balance and stability exercises   Elaine Daring, PT, DPT, LAT, ATC 07/23/24  3:45 PM Phone: [phone]  Fax: [phone]

## 2024-07-24 DIAGNOSIS — M533 Sacrococcygeal disorders, not elsewhere classified: Secondary | ICD-10-CM | POA: Diagnosis not present

## 2024-07-24 DIAGNOSIS — M47816 Spondylosis without myelopathy or radiculopathy, lumbar region: Secondary | ICD-10-CM | POA: Diagnosis not present

## 2024-07-27 NOTE — Progress Notes (Signed)
 Subjective:    Patient ID: [name], male    DOB: 1946-01-12, 78 y.o.   MRN: [id number]      HPI [name] is here for  Chief Complaint  Patient presents with   Joint Swelling    Right calf joint and swelling    Discussed the use of AI scribe software for clinical note transcription with the patient, who gave verbal consent to proceed.  History of Present Illness [name] is a 78 year old male with a history of phlebitis who presents with right leg swelling.  He has experienced significant swelling in his right leg, which has worsened over the past two to three days. The swelling is more pronounced in the evenings and extends midway up the leg. There is no recent injury to the leg. He has a history of phlebitis in the same leg, previously treated with warfarin. Currently, he is on Eliquis , taking it twice daily, and has been consistent with this medication. The swelling improves slightly overnight but worsens as the day progresses. He has been elevating his legs during the day using a stool, but not every time he sits. No fever, chills, or shortness of breath.  He has chronic back pain, which has made it difficult to sleep in a regular bed. He has been using a recliner for the past week. Different sleeping positions initially provided relief, but he experienced increased pain after fully reclining. He continues to use the recliner as he cannot sleep in a regular bed.         Medications and allergies reviewed with patient and updated if appropriate.  Current Outpatient Medications on File Prior to Visit  Medication Sig Dispense Refill   albuterol  (VENTOLIN  HFA) 108 (90 Base) MCG/ACT inhaler INHALE 1-2 PUFFS BY MOUTH INTO THE LUNGS EVERY 6 HOURS AS NEEDED. 8.5 each 2   allopurinol  (ZYLOPRIM ) 300 MG tablet TAKE 1/2 TABLET BY MOUTH DAILY 45 tablet 1   amLODipine  (NORVASC ) 5 MG tablet TAKE 1 TABLET (5 MG TOTAL) BY MOUTH DAILY. 90 tablet 1   apixaban  (ELIQUIS ) 5  MG TABS tablet Take 1 tablet (5 mg total) by mouth 2 (two) times daily.     famotidine  (PEPCID ) 20 MG tablet Take 20 mg by mouth at bedtime.     fluticasone -salmeterol (WIXELA INHUB) 100-50 MCG/ACT AEPB Inhale 1 puff into the lungs 2 (two) times daily. 60 each 5   gabapentin  (NEURONTIN ) 300 MG capsule Take 1 capsule (300 mg total) by mouth 3 (three) times daily. 270 capsule 2   mometasone  (ELOCON ) 0.1 % lotion Apply topically daily. 60 mL 5   pantoprazole  (PROTONIX ) 40 MG tablet Take 40 mg by mouth every morning.     sildenafil  (REVATIO ) 20 MG tablet TAKE 3 TO 5 TABLETS BY MOUTH AS NEEDED 100 tablet 3   simvastatin  (ZOCOR ) 80 MG tablet Take 1 tablet (80 mg total) by mouth daily. 90 tablet 1   valsartan  (DIOVAN ) 40 MG tablet Take 40 mg by mouth daily.     No current facility-administered medications on file prior to visit.    Review of Systems     Objective:   Vitals:   07/28/24 0944  BP: 128/78  Pulse: 100  Temp: 98.2 F (36.8 C)  SpO2: 96%   BP Readings from Last 3 Encounters:  07/28/24 128/78  07/22/24 118/70  07/09/24 124/80   Wt Readings from Last 3 Encounters:  07/28/24 243 lb (110.2 kg)  07/22/24 244 lb  8 oz (110.9 kg)  07/09/24 236 lb (107 kg)   Body mass index is 28.82 kg/m.    Physical Exam Constitutional:      General: He is not in acute distress.    Appearance: Normal appearance. He is not ill-appearing.  HENT:     Head: Normocephalic and atraumatic.  Cardiovascular:     Rate and Rhythm: Normal rate and regular rhythm.  Pulmonary:     Effort: Pulmonary effort is normal. No respiratory distress.     Breath sounds: Normal breath sounds. No wheezing or rales.  Musculoskeletal:     Right lower leg: Edema (2+ pitting halfway up lower leg) present.     Left lower leg: Edema (1+ pitting  1/3 Way up lower leg) present.  Skin:    General: Skin is warm and dry.     Findings: Erythema (Mild bilateral lower extremity erythema likely secondary to swelling)  present.  Neurological:     Mental Status: He is alert.            Assessment & Plan:    See Problem List for Assessment and Plan of chronic medical problems.

## 2024-07-28 ENCOUNTER — Telehealth: Payer: Self-pay

## 2024-07-28 ENCOUNTER — Ambulatory Visit (INDEPENDENT_AMBULATORY_CARE_PROVIDER_SITE_OTHER): Admitting: Internal Medicine

## 2024-07-28 ENCOUNTER — Other Ambulatory Visit (HOSPITAL_BASED_OUTPATIENT_CLINIC_OR_DEPARTMENT_OTHER): Payer: Self-pay

## 2024-07-28 ENCOUNTER — Encounter: Payer: Self-pay | Admitting: Internal Medicine

## 2024-07-28 VITALS — BP 128/78 | HR 100 | Temp 98.2°F | Ht 77.0 in | Wt 243.0 lb

## 2024-07-28 DIAGNOSIS — I10 Essential (primary) hypertension: Secondary | ICD-10-CM | POA: Diagnosis not present

## 2024-07-28 DIAGNOSIS — M7989 Other specified soft tissue disorders: Secondary | ICD-10-CM | POA: Diagnosis not present

## 2024-07-28 MED ORDER — COVID-19 MRNA VAC-TRIS(PFIZER) 30 MCG/0.3ML IM SUSY
0.3000 mL | PREFILLED_SYRINGE | Freq: Once | INTRAMUSCULAR | 0 refills | Status: AC
  Filled 2024-07-28: qty 0.3, 1d supply, fill #0

## 2024-07-28 NOTE — Telephone Encounter (Signed)
 Attempted pre-Watchman call.  Left message to call back. Will confirm new arrival time of 0830.

## 2024-07-28 NOTE — Progress Notes (Signed)
 Remote PPM Transmission

## 2024-07-28 NOTE — Assessment & Plan Note (Signed)
 Acute on chronic Has been bilateral lower extremity edema, but over the past 2-3 days his right lower extremity has been more swollen than usual and more swollen than the left lower extremity On Eliquis  and taking it as prescribed so DVT is unlikely, but we will still rule this out-ultrasound right lower extremity ordered Continue Eliquis  5 mg twice daily He has started sleeping in his recliner over the past week and he is elevating his legs at night, but often he will sit with his legs down during the day Encouraged him to elevate his legs consistently when sitting stressed the importance of this to help better control the edema

## 2024-07-28 NOTE — Patient Instructions (Addendum)
      Medications changes include :   None      An ultrasound of your right lower leg ordered and someone will call you to schedule an appointment.

## 2024-07-28 NOTE — Telephone Encounter (Signed)
 Confirmed procedure date of 07/31/2024. Confirmed NEW arrival time of 0830 for procedure time at 1100. Reviewed pre-procedure instructions with patient. Confirmed he has no contrast allergy. Confirmed he has a MDT CRT (rep has been notified).  The patient understands to call if questions/concerns arise prior to procedure. He was grateful for call and agreed with plan.

## 2024-07-28 NOTE — Assessment & Plan Note (Signed)
 Chronic Blood pressure controlled Continue amlodipine  5 mg daily, valsartan  40 mg daily

## 2024-07-29 ENCOUNTER — Ambulatory Visit: Payer: Self-pay | Admitting: Internal Medicine

## 2024-07-29 ENCOUNTER — Ambulatory Visit (HOSPITAL_COMMUNITY)
Admission: RE | Admit: 2024-07-29 | Discharge: 2024-07-29 | Discharge status: Home / Self Care | Source: Ambulatory Visit | Attending: Internal Medicine

## 2024-07-29 DIAGNOSIS — M7989 Other specified soft tissue disorders: Secondary | ICD-10-CM | POA: Diagnosis not present

## 2024-07-30 ENCOUNTER — Encounter: Payer: Self-pay | Admitting: Physical Therapy

## 2024-07-30 ENCOUNTER — Other Ambulatory Visit: Payer: Self-pay

## 2024-07-30 ENCOUNTER — Ambulatory Visit: Admitting: Physical Therapy

## 2024-07-30 DIAGNOSIS — M5459 Other low back pain: Secondary | ICD-10-CM | POA: Diagnosis not present

## 2024-07-30 DIAGNOSIS — M6281 Muscle weakness (generalized): Secondary | ICD-10-CM

## 2024-07-30 NOTE — Therapy (Signed)
 OUTPATIENT PHYSICAL THERAPY TREATMENT   Patient Name: [name] MRN: [id number] DOB:[birth year], 78 y.o., male Today's Date: 07/30/2024   END OF SESSION:  PT End of Session - 07/30/24 1257     Visit Number 6    Number of Visits 12    Date for PT Re-Evaluation 09/10/24    Authorization Type [employer]    PT Start Time 1300    PT Stop Time 1340    PT Time Calculation (min) 40 min    Activity Tolerance Patient tolerated treatment well    Behavior During Therapy WFL for tasks assessed/performed               Past Medical History:  Diagnosis Date   Complete heart block (HCC)    S/P  PPM by Dr Kelsie   Coronary artery disease    Non-obstructive. Pt had left heart catheterization in August 2009 showing a 30% mid-circumflex lesion, otherwise coronaries were clean angiographically   Hyperlipidemia    Hypertension    OSA (obstructive sleep apnea)    Probable; study never scheduled   Pacemaker 04/04/2024   Past Surgical History:  Procedure Laterality Date   BIV UPGRADE N/A 04/04/2024   Procedure: BIV UPGRADE;  Surgeon: Mealor, Eulas BRAVO, MD;  Location: [facility]AS[facility];  Service: Cardiovascular;  Laterality: N/A;   CARDIAC CATHETERIZATION  1993, 2009   2009- negative   COLONOSCOPY  2011   negative, [facility]   LEFT HEART CATH AND CORONARY ANGIOGRAPHY N/A 03/04/2024   Procedure: LEFT HEART CATH AND CORONARY ANGIOGRAPHY;  Surgeon: Wonda Sharper, MD;  Location: [facility]ASIVE CV [facility];  Service: Cardiovascular;  Laterality: N/A;   PACEMAKER INSERTION  01/13/11   MDT by MILUS for complete heart block   PPM GENERATOR CHANGEOUT N/A 04/04/2024   Procedure: PPM GENERATOR CHANGEOUT;  Surgeon: Nancey, Eulas BRAVO, MD;  Location: [facility];  Service: Cardiovascular;  Laterality: N/A;   Patient Active Problem List   Diagnosis Date Noted   Right leg swelling 07/28/2024   Pain in right lower leg 05/05/2024   Multiple skin tears 03/06/2024   CAD in native artery  02/14/2024   CKD (chronic kidney disease) stage 3, GFR 30-59 ml/min (HCC) 02/10/2024   Skin abrasion 08/30/2023   Cellulitis of left lower extremity 08/30/2023   Elevated PSA 03/14/2023   Elevated LFTs 03/14/2023   Atrial flutter (HCC) 02/13/2023   Chronic left SI joint pain 12/22/2022   DOE (dyspnea on exertion) 12/22/2022   Chest pain, atypical 10/10/2022   Upper airway cough syndrome 10/10/2022   Chronic cough 02/01/2022   Chronic right SI joint pain 05/11/2021   Umbilical hernia without obstruction or gangrene 02/02/2021   Hepatic steatosis 08/05/2020   Aortic atherosclerosis (HCC) 08/04/2020   Complex regional pain syndrome type I of the upper limb 07/26/2020   Flexion contracture of joint of hand 07/26/2020   Pacemaker 07/26/2020   Rib pain on left side 06/09/2020   Decreased pulses in feet 06/09/2020   Erectile dysfunction 03/03/2017   Chronic headaches 02/13/2017   Snoring 02/15/2015   Elevated liver enzymes 09/13/2014   Dupuytren's contracture of both hands 09/02/2013   Stiffness of right hand joint 05/07/2013   Gout 12/27/2011   Essential hypertension 01/12/2011   Complete heart block (HCC) - PPM 01/12/2011   Hyperglycemia 03/02/2009   HYPERPLASIA PROSTATE UNS W/O UR OBST & OTH LUTS 08/13/2008   Hyperlipidemia 02/17/2008   PAROXYSMAL SUPRAVENTRICULAR TACHYCARDIA 02/17/2008    PCP: Geofm,  Glade PARAS, MD  REFERRING PROVIDER: Joane Artist RAMAN, MD  REFERRING DIAG: Chronic bilateral low back pain without sciatica; Chronic SI joint pain; Chronic left shoulder pain  Rationale for Evaluation and Treatment: Rehabilitation  THERAPY DIAG:  Other low back pain  Muscle weakness (generalized)  ONSET DATE: Chronic   SUBJECTIVE:          SUBJECTIVE STATEMENT: Patient reports he feels like the LAD performed last week made him more sore so would like to hold off on that today. He does report improvement in his sleeping and stiffness in the morning by sleeping in his chair.    Eval: Patient reports he became inactive years ago and they found that his SIJ are screwed up and they get better with shots. He has had a fall recently and had a new pacemaker put in. He reports that if he walks extended periods of time then he will be hurting right around the back of the hips. He states he fell while in the hospital and with his new pacemaker he is not allowed to lift more that 10 lbs with the left arm. He does report feeling slightly off balance. He always uses a hand rail when going up and down stairs.   PERTINENT HISTORY:  See PMH above  PAIN:  Are you having pain? Yes:  NPRS scale: 3/10 currently Pain location: Bilateral lower back and SIJ region Pain description: Stiff, locked-up Aggravating factors: Worse in morning, walking or standing extended periods, anything that requires bending Relieving factors: Patches or sprays, medication  PRECAUTIONS: Fall  WEIGHT BEARING RESTRICTIONS: No  FALLS:  Has patient fallen in last 6 months? Yes. Number of falls 1  PATIENT GOALS: Get more flexible and move better.    OBJECTIVE:  Note: Objective measures were completed at Evaluation unless otherwise noted. PATIENT SURVEYS:  Modified Oswestry: 11/50 (22% disability)  07/16/2024: 10/50 (20% disability)  MUSCLE LENGTH: Limitation with hamstring, quad and hip flexibility  POSTURE:  Rounded shoulder posture, remains in slightly knee flexed/crouched position, decreased lumbar lordosis  PALPATION: Tender to palpation bilateral lumbar paraspinals, PSIS and SIJ region, piriformis region  Lumbar CPA hypomobile throughout  LUMBAR ROM:   AROM eval  Flexion Limited and tight  Extension Limited and painful  Right lateral flexion   Left lateral flexion   Right rotation Limited and tight  Left rotation Limited and tight   (Blank rows = not tested)  LOWER EXTREMITY ROM:    Hip PROM grossly limited in all directions with tightness reported at end ranges  LOWER  EXTREMITY MMT:    MMT Right eval Left eval Rt / Lt 07/16/2024  Hip flexion 4- 4-   Hip extension 3- 3- 3- / 3-  Hip abduction 3 3 3  / 3  Hip adduction     Hip internal rotation     Hip external rotation     Knee flexion 4 4 4  / 4  Knee extension 4 4 4  / 4  Ankle dorsiflexion     Ankle plantarflexion     Ankle inversion     Ankle eversion      (Blank rows = not tested)  FUNCTIONAL TESTS:  5 times sit to stand: 15 seconds  07/16/2024: 15 seconds  Romberg stance: patient demonstrates greater difficulty with eyes closed   GAIT: Assistive device utilized: None Level of assistance: Complete Independence Comments: Remains in relative knee flexed/crouched, toe out, unsteady gait   TREATMENT [facility] Adult PT Treatment:  DATE: 07/30/2024 Recumbent bike L3 x 8 min to improve endurance and workload capacity IASTM for bilateral lumbar paraspinals and gluteal/piriformis region using theragun Prone lumbar PA mobs Prone quad stretch Seated lumbar flexion stretch with stability ball  Seated hamstring stretch Slant board calf stretch  PATIENT EDUCATION:  Education details: HEP Person educated: Patient Education method: Programmer, multimedia, Facilities manager, Actor cues, Verbal cues Education comprehension: verbalized understanding, returned demonstration, verbal cues required, tactile cues required, and needs further education  HOME EXERCISE PROGRAM: Access Code: [email or web address]    ASSESSMENT: CLINICAL IMPRESSION: Patient tolerated therapy well with no adverse effects. Therapy continues to focus primarily on improving patients mobility. Continued with theragun to reduce muscle tension through the lumbar spine and hips, lumbar mobs, and stretching. He did decline supine exercises this visit so incorporated more seated stretching and standing calf stretches. He does report feeling continued improvement. No changes to his HEP this visit.  Patient would benefit  from continued skilled PT to progress mobility and strength in order to reduce pain and maximize functional ability.   Eval: Patient is a 78 y.o. male who was seen today for physical therapy evaluation and treatment for chronic lower back and pelvic pain, and recent fall.   OBJECTIVE IMPAIRMENTS: Abnormal gait, decreased activity tolerance, decreased balance, decreased ROM, decreased strength, impaired flexibility, postural dysfunction, and pain.   ACTIVITY LIMITATIONS: carrying, lifting, bending, standing, stairs, and locomotion level  PARTICIPATION LIMITATIONS: meal prep, cleaning, shopping, and community activity  PERSONAL FACTORS: Fitness, Past/current experiences, and Time since onset of injury/illness/exacerbation are also affecting patient's functional outcome.    GOALS: Goals reviewed with patient? Yes  SHORT TERM GOALS: Target date: 06/04/2024  Patient will be I with initial HEP in order to progress with therapy. Baseline: HEP provided at eval 06/11/2024: independent Goal status: MET  LONG TERM GOALS: Target date: 09/10/2024  Patient will be I with final HEP to maintain progress from PT. Baseline: HEP provided at eval 07/16/2024: progressing Goal status: ONGOING  2.  Patient will report Modified Oswestry </= 5/50 (10% disability) in order to indicate an improvement in their functional status Baseline: 11/50 (22%) 07/16/2024: 10/50 (20% disability) Goal status: ONGOING  3.  Patient will demonstrate 5xSTS </= 12 seconds to indicate improvement in strength and reduce fall risk Baseline: 15 seconds 07/16/2024: 15 seconds Goal status: ONGOING  4.  Patient will be able to walk for extended periods without need to stop due to pain in order to improve his functional ability Baseline: patient reports frequent stops while walking due to pain 07/16/2024: reports continued limitation Goal status: ONGOING   PLAN: PT FREQUENCY: 1x/week  PT DURATION: 8 weeks  PLANNED  INTERVENTIONS: 97164- PT Re-evaluation, 97750- Physical Performance Testing, 97110-Therapeutic exercises, 97530- Therapeutic activity, 97112- Neuromuscular re-education, 97535- Self Care, 02859- Manual therapy, 20560 (1-2 muscles), 20561 (3+ muscles)- Dry Needling, Patient/Family education, Balance training, Stair training, Taping, Joint mobilization, Joint manipulation, Spinal manipulation, Spinal mobilization, Cryotherapy, and Moist heat.  PLAN FOR NEXT SESSION: Review HEP and progress PRN, continue to work on LE and hip flexibility, progress LE, core, hip strengthening to improve activity tolerance, incorporate balance and stability exercises   Elaine Daring, PT, DPT, LAT, ATC 07/30/24  1:50 PM Phone: [phone] Fax: [phone]

## 2024-07-31 ENCOUNTER — Other Ambulatory Visit: Payer: Self-pay | Admitting: Internal Medicine

## 2024-07-31 ENCOUNTER — Inpatient Hospital Stay (HOSPITAL_COMMUNITY)

## 2024-07-31 ENCOUNTER — Inpatient Hospital Stay (HOSPITAL_COMMUNITY)
Admission: RE | Disposition: A | Discharge status: Home / Self Care | Payer: Self-pay | Source: Home / Self Care | Attending: Cardiology

## 2024-07-31 ENCOUNTER — Other Ambulatory Visit: Payer: Self-pay

## 2024-07-31 ENCOUNTER — Inpatient Hospital Stay (HOSPITAL_COMMUNITY): Admitting: Registered Nurse

## 2024-07-31 ENCOUNTER — Inpatient Hospital Stay (HOSPITAL_COMMUNITY)
Admission: RE | Admit: 2024-07-31 | Discharge: 2024-07-31 | DRG: 274 | Disposition: A | Discharge status: Home / Self Care | Attending: Cardiology | Admitting: Cardiology

## 2024-07-31 ENCOUNTER — Encounter (HOSPITAL_COMMUNITY): Payer: Self-pay | Admitting: Cardiology

## 2024-07-31 DIAGNOSIS — Z7901 Long term (current) use of anticoagulants: Secondary | ICD-10-CM | POA: Diagnosis not present

## 2024-07-31 DIAGNOSIS — I4892 Unspecified atrial flutter: Secondary | ICD-10-CM | POA: Diagnosis present

## 2024-07-31 DIAGNOSIS — Z006 Encounter for examination for normal comparison and control in clinical research program: Secondary | ICD-10-CM

## 2024-07-31 DIAGNOSIS — I442 Atrioventricular block, complete: Secondary | ICD-10-CM | POA: Diagnosis present

## 2024-07-31 DIAGNOSIS — I480 Paroxysmal atrial fibrillation: Secondary | ICD-10-CM | POA: Diagnosis not present

## 2024-07-31 DIAGNOSIS — Z79899 Other long term (current) drug therapy: Secondary | ICD-10-CM | POA: Diagnosis not present

## 2024-07-31 DIAGNOSIS — I2510 Atherosclerotic heart disease of native coronary artery without angina pectoris: Secondary | ICD-10-CM | POA: Diagnosis present

## 2024-07-31 DIAGNOSIS — Z87891 Personal history of nicotine dependence: Secondary | ICD-10-CM | POA: Diagnosis not present

## 2024-07-31 DIAGNOSIS — I10 Essential (primary) hypertension: Secondary | ICD-10-CM | POA: Diagnosis present

## 2024-07-31 DIAGNOSIS — D6869 Other thrombophilia: Secondary | ICD-10-CM | POA: Diagnosis present

## 2024-07-31 DIAGNOSIS — E785 Hyperlipidemia, unspecified: Secondary | ICD-10-CM | POA: Diagnosis present

## 2024-07-31 DIAGNOSIS — I4819 Other persistent atrial fibrillation: Secondary | ICD-10-CM | POA: Diagnosis present

## 2024-07-31 DIAGNOSIS — Z950 Presence of cardiac pacemaker: Secondary | ICD-10-CM

## 2024-07-31 DIAGNOSIS — I483 Typical atrial flutter: Secondary | ICD-10-CM

## 2024-07-31 DIAGNOSIS — Z95818 Presence of other cardiac implants and grafts: Secondary | ICD-10-CM

## 2024-07-31 DIAGNOSIS — I77810 Thoracic aortic ectasia: Secondary | ICD-10-CM | POA: Diagnosis present

## 2024-07-31 DIAGNOSIS — I3139 Other pericardial effusion (noninflammatory): Secondary | ICD-10-CM | POA: Diagnosis present

## 2024-07-31 DIAGNOSIS — Z7951 Long term (current) use of inhaled steroids: Secondary | ICD-10-CM

## 2024-07-31 HISTORY — PX: LEFT ATRIAL APPENDAGE OCCLUSION: EP1229

## 2024-07-31 HISTORY — PX: TRANSESOPHAGEAL ECHOCARDIOGRAM (CATH LAB): EP1270

## 2024-07-31 LAB — TYPE AND SCREEN
ABO/RH(D): O POS
Antibody Screen: NEGATIVE

## 2024-07-31 LAB — SURGICAL PCR SCREEN
MRSA, PCR: NEGATIVE
Staphylococcus aureus: NEGATIVE

## 2024-07-31 LAB — ABO/RH: ABO/RH(D): O POS

## 2024-07-31 LAB — POCT ACTIVATED CLOTTING TIME: Activated Clotting Time: 458 s

## 2024-07-31 LAB — ECHO TEE

## 2024-07-31 SURGERY — LEFT ATRIAL APPENDAGE OCCLUSION
Anesthesia: General

## 2024-07-31 MED ORDER — SODIUM CHLORIDE 0.9 % IV SOLN: INTRAVENOUS | Status: DC

## 2024-07-31 MED ORDER — FENTANYL CITRATE (PF) 100 MCG/2ML IJ SOLN
INTRAMUSCULAR | Status: AC
  Filled 2024-07-31: qty 2

## 2024-07-31 MED ORDER — FENTANYL CITRATE (PF) 250 MCG/5ML IJ SOLN
INTRAMUSCULAR | Status: DC | PRN
  Administered 2024-07-31: 100 ug via INTRAVENOUS

## 2024-07-31 MED ORDER — PROPOFOL 10 MG/ML IV BOLUS
INTRAVENOUS | Status: DC | PRN
  Administered 2024-07-31: 150 mg via INTRAVENOUS

## 2024-07-31 MED ORDER — SODIUM CHLORIDE 0.9 % IV SOLN: 250.0000 mL | INTRAVENOUS | Status: DC | PRN

## 2024-07-31 MED ORDER — SODIUM CHLORIDE 0.9% FLUSH
3.0000 mL | Freq: Two times a day (BID) | INTRAVENOUS | Status: DC
  Administered 2024-07-31: 3 mL via INTRAVENOUS

## 2024-07-31 MED ORDER — CEFAZOLIN SODIUM-DEXTROSE 2-4 GM/100ML-% IV SOLN
2.0000 g | INTRAVENOUS | Status: AC
  Administered 2024-07-31: 2 g via INTRAVENOUS
  Filled 2024-07-31: qty 100

## 2024-07-31 MED ORDER — ORAL CARE MOUTH RINSE: 15.0000 mL | OROMUCOSAL | Status: DC | PRN

## 2024-07-31 MED ORDER — ONDANSETRON HCL 4 MG/2ML IJ SOLN: 4.0000 mg | Freq: Four times a day (QID) | INTRAMUSCULAR | Status: DC | PRN

## 2024-07-31 MED ORDER — SODIUM CHLORIDE 0.9% FLUSH: 3.0000 mL | INTRAVENOUS | Status: DC | PRN

## 2024-07-31 MED ORDER — ACETAMINOPHEN 325 MG PO TABS
650.0000 mg | ORAL_TABLET | ORAL | Status: DC | PRN
  Administered 2024-07-31: 650 mg via ORAL
  Filled 2024-07-31: qty 2

## 2024-07-31 MED ORDER — HEPARIN (PORCINE) IN NACL 1000-0.9 UT/500ML-% IV SOLN
INTRAVENOUS | Status: DC | PRN
  Administered 2024-07-31: 1000 mL

## 2024-07-31 MED ORDER — ROCURONIUM BROMIDE 10 MG/ML (PF) SYRINGE
PREFILLED_SYRINGE | INTRAVENOUS | Status: DC | PRN
  Administered 2024-07-31: 60 mg via INTRAVENOUS
  Administered 2024-07-31: 20 mg via INTRAVENOUS
  Administered 2024-07-31: 10 mg via INTRAVENOUS

## 2024-07-31 MED ORDER — IOHEXOL 350 MG/ML SOLN
INTRAVENOUS | Status: DC | PRN
  Administered 2024-07-31: 40 mL

## 2024-07-31 MED ORDER — ACETAMINOPHEN 500 MG PO TABS
1000.0000 mg | ORAL_TABLET | Freq: Once | ORAL | Status: AC
  Administered 2024-07-31: 1000 mg via ORAL
  Filled 2024-07-31: qty 2

## 2024-07-31 MED ORDER — SUGAMMADEX SODIUM 200 MG/2ML IV SOLN
INTRAVENOUS | Status: DC | PRN
  Administered 2024-07-31: 300 mg via INTRAVENOUS

## 2024-07-31 MED ORDER — HEPARIN SODIUM (PORCINE) 1000 UNIT/ML IJ SOLN
INTRAMUSCULAR | Status: DC | PRN
Start: 2024-07-31 — End: 2024-07-31
  Administered 2024-07-31: 16000 [IU] via INTRAVENOUS

## 2024-07-31 MED ORDER — LIDOCAINE 2% (20 MG/ML) 5 ML SYRINGE
INTRAMUSCULAR | Status: DC | PRN
  Administered 2024-07-31: 20 mg via INTRAVENOUS

## 2024-07-31 MED ORDER — ONDANSETRON HCL 4 MG/2ML IJ SOLN
INTRAMUSCULAR | Status: DC | PRN
Start: 2024-07-31 — End: 2024-07-31
  Administered 2024-07-31: 4 mg via INTRAVENOUS

## 2024-07-31 MED ORDER — CHLORHEXIDINE GLUCONATE 4 % EX SOLN: Freq: Once | CUTANEOUS | Status: DC

## 2024-07-31 MED ORDER — CHLORHEXIDINE GLUCONATE 0.12 % MT SOLN
15.0000 mL | Freq: Once | OROMUCOSAL | Status: AC
Start: 2024-07-31 — End: 2024-07-31
  Administered 2024-07-31: 15 mL via OROMUCOSAL

## 2024-07-31 MED ORDER — PROTAMINE SULFATE 10 MG/ML IV SOLN
INTRAVENOUS | Status: DC | PRN
  Administered 2024-07-31: 35 mg via INTRAVENOUS

## 2024-07-31 MED ORDER — APIXABAN 5 MG PO TABS
5.0000 mg | ORAL_TABLET | Freq: Once | ORAL | Status: AC
  Administered 2024-07-31: 5 mg via ORAL
  Filled 2024-07-31: qty 1

## 2024-07-31 MED ORDER — PHENYLEPHRINE 80 MCG/ML (10ML) SYRINGE FOR IV PUSH (FOR BLOOD PRESSURE SUPPORT)
PREFILLED_SYRINGE | INTRAVENOUS | Status: DC | PRN
  Administered 2024-07-31: 80 ug via INTRAVENOUS
  Administered 2024-07-31: 160 ug via INTRAVENOUS
  Administered 2024-07-31 (×2): 80 ug via INTRAVENOUS

## 2024-07-31 SURGICAL SUPPLY — 21 items
BLANKET WARM UNDERBOD FULL ACC (MISCELLANEOUS) ×1 IMPLANT
CATH INFINITI 5FR ANG PIGTAIL (CATHETERS) IMPLANT
CATH WATCHMAN STEER ACCESS SYS (CATHETERS) IMPLANT
CLOSURE PERCLOSE PROSTYLE (VASCULAR PRODUCTS) IMPLANT
DEVICE WATCHMAN FLX PRO PROC (KITS) IMPLANT
DEVICE WATCHMAN TRUSTEER PROC (KITS) IMPLANT
DILATOR VESSEL 38 20CM 11FR (INTRODUCER) IMPLANT
ELECT DEFIB PAD ADLT CADENCE (PAD) IMPLANT
KIT VERSACROSS CON 12FR 85 (KITS) IMPLANT
PACK CARDIAC CATHETERIZATION (CUSTOM PROCEDURE TRAY) ×1 IMPLANT
PAD DEFIB RADIO PHYSIO CONN (PAD) ×1 IMPLANT
SHEATH PERFORMER 18FRX30 (VASCULAR PRODUCTS) IMPLANT
SHEATH PINNACLE 8F 10CM (SHEATH) IMPLANT
SHEATH PROBE COVER 6X72 (BAG) ×1 IMPLANT
SHIELD RADPAD SCOOP 12X17 (MISCELLANEOUS) ×1 IMPLANT
TRANSDUCER W/STOPCOCK (MISCELLANEOUS) ×1 IMPLANT
TUBING ART PRESS 72 MALE/FEM (TUBING) IMPLANT
TUBING CIL FLEX 10 FLL-RA (TUBING) ×1 IMPLANT
VALVE CHECKFLO PERFORMER (SHEATH) IMPLANT
WATCHMAN FLX PRO 31 (Prosthesis & Implant Heart) IMPLANT
WIRE HITORQ VERSACORE ST 145CM (WIRE) IMPLANT

## 2024-07-31 NOTE — Anesthesia Preprocedure Evaluation (Addendum)
 Anesthesia Evaluation  Patient identified by MRN, date of birth, ID band Patient awake    Reviewed: Allergy & Precautions, NPO status , Patient's Chart, lab work & pertinent test results  History of Anesthesia Complications Negative for: history of anesthetic complications  Airway Mallampati: II  TM Distance: >3 FB Neck ROM: Full    Dental  (+) Missing, Dental Advisory Given   Pulmonary sleep apnea and Continuous Positive Airway Pressure Ventilation , COPD,  COPD inhaler, former smoker   breath sounds clear to auscultation       Cardiovascular hypertension, Pt. on medications (-) angina + CAD (non-obstructive)  + dysrhythmias Atrial Fibrillation + pacemaker  Rhythm:Irregular Rate:Normal  06/2024 ECHO: 1. Compared to 02/06/24 LV function improved.   2. LV EF 45 to 50%. The LV has mildly decreased function, global hypokinesis. There is moderate concentric LVH.  Grade I diastolic dysfunction (impaired relaxation).   3. RVF is normal. The right ventricular size is normal.   4. A small pericardial effusion is present.   5. The mitral valve is normal in structure. No evidence of mitral valve regurgitation. No evidence of mitral stenosis.   6. The aortic valve is tricuspid. Aortic valve regurgitation is not visualized. Aortic valve sclerosis is present, with no evidence of aortic valve stenosis.     Neuro/Psych negative neurological ROS     GI/Hepatic Neg liver ROS,GERD  Medicated and Controlled,,  Endo/Other  negative endocrine ROS    Renal/GU Renal InsufficiencyRenal disease     Musculoskeletal   Abdominal   Peds  Hematology Hb 13.9, plt 188k eliquis    Anesthesia Other Findings   Reproductive/Obstetrics                              Anesthesia Physical Anesthesia Plan  ASA: 3  Anesthesia Plan: General   Post-op Pain Management: Tylenol  PO (pre-op)*   Induction: Intravenous  PONV Risk  Score and Plan: 2 and Ondansetron  and Dexamethasone  Airway Management Planned: Oral ETT  Additional Equipment: ClearSight  Intra-op Plan:   Post-operative Plan: Extubation in OR  Informed Consent: I have reviewed the patients History and Physical, chart, labs and discussed the procedure including the risks, benefits and alternatives for the proposed anesthesia with the patient or authorized representative who has indicated his/her understanding and acceptance.     Dental advisory given  Plan Discussed with: CRNA and Surgeon  Anesthesia Plan Comments:          Anesthesia Quick Evaluation

## 2024-07-31 NOTE — Discharge Instructions (Signed)
 WATCHMANT Procedure, Care After  Procedure MD: Dr. Alene Mires Clinical Coordinator: Karsten Fells, RN  This sheet gives you information about how to care for yourself after your procedure. Your health care provider may also give you more specific instructions. If you have problems or questions, contact your health care provider.  What can I expect after the procedure? After the procedure, it is common to have: Bruising around your puncture site. Tenderness around your puncture site. Tiredness (fatigue).  Medication instructions It is very important to continue to take your blood thinner as directed by your doctor after the Watchman procedure. Call your procedure doctor's office with question or concerns. If you are on Coumadin (warfarin), you will have your INR checked the week after your procedure, with a goal INR of 2.0 - 3.0. Please follow your medication instructions on your discharge summary. Only take the medications listed on your discharge paperwork.  Follow up You will be seen in 6 weeks after your procedure You will have a repeat CT scan or Echocardiogram approximately 8 weeks after your procedure mark to check your device You will follow up the MD/APP who performed your procedure 6 months after your procedure The Watchman Clinical Coordinator will check in with you from time to time, including 1 and 2 years after your procedure.  NO DENTAL CLEANINGS FOR 45 days. After that, you will require antibiotics for dental procedures the first 6 months.   Follow these instructions at home: Puncture site care  Follow instructions from your health care provider about how to take care of your puncture site. Make sure you: If present, leave stitches (sutures), skin glue, or adhesive strips in place.  If a large square bandage is present, this may be removed 24 hours after surgery.  Check your puncture site every day for signs of infection. Check for: Redness, swelling, or pain. Fluid  or blood. If your puncture site starts to bleed, lie down on your back, apply firm pressure to the area, and contact your health care provider. Warmth. Pus or a bad smell. Driving Do not drive yourself home if you received sedation Do not drive for at least 4 days after your procedure or however long your health care provider recommends. (Do not resume driving if you have previously been instructed not to drive for other health reasons.) Do not spend greater than 1 hour at a time in a car for the first 3 days. Stop and take a break with a 5 minute walk at least every hour.  Do not drive or use heavy machinery while taking prescription pain medicine.  Activity Avoid activities that take a lot of effort, including exercise, for at least 7 days after your procedure. For the first 3 days, avoid sitting for longer than one hour at a time.  Avoid alcoholic beverages, signing paperwork, or participating in legal proceedings for 24 hours after receiving sedation Do not lift anything that is heavier than 10 lb (4.5 kg) for one week.  No sexual activity for 1 week.  Return to your normal activities as told by your health care provider. Ask your health care provider what activities are safe for you. General instructions Take over-the-counter and prescription medicines only as told by your health care provider. Do not use any products that contain nicotine or tobacco, such as cigarettes and e-cigarettes. If you need help quitting, ask your health care provider. You may shower after 24 hours, but Do not take baths, swim, or use a hot tub for  1 week.  Do not drink alcohol for 24 hours after your procedure. Keep all follow-up visits as told by your health care provider. This is important. Dental Work: You will require antibiotics prior to any dental work, including cleanings, for 6 months after your Watchman implantation to help protect you from infection. After 6 months, antibiotics are no longer  required. Contact a health care provider if: You have redness, mild swelling, or pain around your puncture site. You have soreness in your throat or at your puncture site that does not improve after several days You have fluid or blood coming from your puncture site that stops after applying firm pressure to the area. Your puncture site feels warm to the touch. You have pus or a bad smell coming from your puncture site. You have a fever. You have chest pain or discomfort that spreads to your neck, jaw, or arm. You are sweating a lot. You feel nauseous. You have a fast or irregular heartbeat. You have shortness of breath. You are dizzy or light-headed and feel the need to lie down. You have pain or numbness in the arm or leg closest to your puncture site. Get help right away if: Your puncture site suddenly swells. Your puncture site is bleeding and the bleeding does not stop after applying firm pressure to the area. These symptoms may represent a serious problem that is an emergency. Do not wait to see if the symptoms will go away. Get medical help right away. Call your local emergency services (911 in the [place] Do not drive yourself to the hospital. Summary After the procedure, it is normal to have bruising and tenderness at the puncture site in your groin, neck, or forearm. Check your puncture site every day for signs of infection. Get help right away if your puncture site is bleeding and the bleeding does not stop after applying firm pressure to the area. This is a medical emergency.  This information is not intended to replace advice given to you by your health care provider. Make sure you discuss any questions you have with your health care provider.

## 2024-07-31 NOTE — Interval H&P Note (Signed)
 History and Physical Interval Note:  07/31/2024 11:20 AM  [name]  has presented today for surgery, with the diagnosis of paroxymal atrial fibrillation and atrial flutter.  The various methods of treatment have been discussed with the patient and family. After consideration of risks, benefits and other options for treatment, the patient has consented to  Procedure(s): LEFT ATRIAL APPENDAGE OCCLUSION (N/A) TRANSESOPHAGEAL ECHOCARDIOGRAM (N/A) as a surgical intervention.  The patient's history has been reviewed, patient examined, no change in status, stable for surgery.  I have reviewed the patient's chart and labs.  Questions were answered to the patient's satisfaction.     Fonda Kitty

## 2024-07-31 NOTE — Transfer of Care (Signed)
 Immediate Anesthesia Transfer of Care Note  Patient: [name]  Procedure(s) Performed: LEFT ATRIAL APPENDAGE OCCLUSION TRANSESOPHAGEAL ECHOCARDIOGRAM  Patient Location: PACU  Anesthesia Type:General  Level of Consciousness: awake and patient cooperative  Airway & Oxygen Therapy: Patient connected to face mask oxygen  Post-op Assessment: Report given to RN and Post -op Vital signs reviewed and stable  Post vital signs: Reviewed and stable  Last Vitals:  Vitals Value Taken Time  BP 130/70 1320  Temp    Pulse 90 07/31/24 13:20  Resp 12 1320  SpO2 100 1320    Last Pain:  Vitals:   07/31/24 1228  TempSrc:   PainSc: Asleep      Patients Stated Pain Goal: 4 (07/31/24 0912)  Complications: There were no known notable events for this encounter.

## 2024-07-31 NOTE — Progress Notes (Signed)
Order received to discharge patient.  Telemetry monitor removed and CCMD notified.  PIV access x2 removed.  Discharge instructions, follow up, medications and instructions for their use discussed with patient. 

## 2024-07-31 NOTE — Anesthesia Procedure Notes (Signed)
 Procedure Name: Intubation Date/Time: 07/31/2024 12:00 PM  Performed by: Christopher Comings, CRNAPre-anesthesia Checklist: Patient identified, Emergency Drugs available, Suction available and Patient being monitored Patient Re-evaluated:Patient Re-evaluated prior to induction Oxygen Delivery Method: Circle system utilized Preoxygenation: Pre-oxygenation with 100% oxygen Induction Type: IV induction Ventilation: Mask ventilation without difficulty and Oral airway inserted - appropriate to patient size Laryngoscope Size: Mac and 4 Grade View: Grade I Tube type: Oral Tube size: 7.5 mm Number of attempts: 1 Airway Equipment and Method: Stylet and Oral airway Placement Confirmation: ETT inserted through vocal cords under direct vision, positive ETCO2 and breath sounds checked- equal and bilateral Secured at: 23 cm Tube secured with: Tape Dental Injury: Teeth and Oropharynx as per pre-operative assessment

## 2024-07-31 NOTE — Progress Notes (Signed)
 Mobility Specialist Progress Note:    07/31/24 1758  Mobility  Activity Ambulated with assistance  Level of Assistance Minimal assist, patient does 75% or more  Assistive Device Front wheel walker  Distance Ambulated (ft) 250 ft  Activity Response Tolerated well  Mobility Referral Yes  Mobility visit 1 Mobility  Mobility Specialist Start Time (ACUTE ONLY) 1710  Mobility Specialist Stop Time (ACUTE ONLY) 1750  Mobility Specialist Time Calculation (min) (ACUTE ONLY) 40 min   Pt received in bed, agreeable to mobility session as bed rest ended. MinA to stand with RW and gait belt. Tolerated well, c/o sacral arthritis and numbness in legs. Returned pt to room, lying in bed with wife and MD at bedside. All needs met.    Tadeusz Stahl Mobility Specialist Please contact via Special educational needs teacher or  Rehab office at [phone]

## 2024-07-31 NOTE — Anesthesia Postprocedure Evaluation (Signed)
 Anesthesia Post Note  Patient: [name]  Procedure(s) Performed: LEFT ATRIAL APPENDAGE OCCLUSION TRANSESOPHAGEAL ECHOCARDIOGRAM     Patient location during evaluation: Cath Lab Anesthesia Type: General Level of consciousness: awake and alert, oriented and patient cooperative Pain management: pain level controlled Vital Signs Assessment: post-procedure vital signs reviewed and stable Respiratory status: spontaneous breathing, nonlabored ventilation, respiratory function stable and patient connected to nasal cannula oxygen Cardiovascular status: blood pressure returned to baseline and stable Postop Assessment: no apparent nausea or vomiting Anesthetic complications: no   There were no known notable events for this encounter.  Last Vitals:  Vitals:   07/31/24 1315 07/31/24 1320  BP:    Pulse: (!) 0 (!) 0  Resp:    Temp:    SpO2:      Last Pain:  Vitals:   07/31/24 1320  TempSrc:   PainSc: 4                  Shyra Emile,E. Jeannie Mallinger

## 2024-07-31 NOTE — Discharge Summary (Addendum)
 Electrophysiology Discharge Summary   Patient ID: [name],  MRN: [id number], DOB/AGE: October 05, 1946 78 y.o.  Admit date: 07/31/2024 Discharge date: 07/31/2024  Primary Care Physician: Geofm Glade PARAS, MD  Primary Cardiologist: None  Electrophysiologist: [name] Furbish, MD     Primary Discharge Diagnosis:  Paroxysmal Atrial Fibrillation Poor candidacy for long term anticoagulation due to recurrent, diffuse bleeding from large skin tears  Secondary Discharge Diagnosis:  Secondary Hypercoagulable State   Procedures This Admission:  Transeptal Puncture Intra-procedural TEE which showed no LAA thrombus Left atrial appendage occlusive device placement on 07/31/24 by Dr. Kennyth.    This study demonstrated: 1.Successful implantation of a 31mm WATCHMAN Flx Pro left atrial appendage occlusive device    2. TEE demonstrating no LAA thrombus 3. No early apparent complications.    Post Implant Anticoagulation Strategy: Continue Eliquis  5mg  BID for 45 days then transition to DAPT with aspirin  81mg  and Plavix 75mg  once daily. Repeat imaging in 60 days with CT scan.     Brief HPI: [name] is a 78 y.o. male with a history of Paroxysmal Atrial Fibrillation who was referred to Electrophysiology in the outpatient setting    Hospital Course:  The patient was admitted and underwent left atrial appendage occlusive device placement as above.  The patient was monitored in the post procedure setting and has done very well with no concerns. Given this, he/she is being considered for same day discharge later today. Groin site has been stable without evidence of hematoma or bleeding. Wound care and restrictions were reviewed with the patient.   The patient has been scheduled for post procedure follow up with EP APP in approximately 6 weeks. They will restart Eliquis  this evening and continue for 45 days then stop. At that time he will transition to Plavix 75mg  daily to complete 6 months of  therapy. They will require dental SBE for 6 month post op and should refrain from dental work or cleanings for the first 45 days post implant. SBE to be RXd at follow up.   A repeat CT scan will be performed in approximately 60 days to ensure proper seal of the device.    Physical Exam: Vitals:   07/31/24 1539 07/31/24 1600 07/31/24 1700 07/31/24 1800  BP: 138/75 133/75 133/73   Pulse: 79 77 79   Resp: 15 18 16 20   Temp: 98.4 F (36.9 C) 98.2 F (36.8 C)    TempSrc: Oral Oral    SpO2: 93% 94% 92%   Weight:      Height:        GEN: Well nourished, well developed in no acute distress NECK: No JVD; No carotid bruits CARDIAC: Regular rate and rhythm, no murmurs, rubs, gallops RESPIRATORY:  Clear to auscultation without rales, wheezing or rhonchi  ABDOMEN: Soft, non-tender, non-distended EXTREMITIES:  No edema; No deformity. Groin site Stable     Discharge Medications:  Allergies as of 07/31/2024   No Active Allergies      Medication List     TAKE these medications    albuterol  108 (90 Base) MCG/ACT inhaler Commonly known as: VENTOLIN  HFA INHALE 1-2 PUFFS BY MOUTH INTO THE LUNGS EVERY 6 HOURS AS NEEDED.   allopurinol  300 MG tablet Commonly known as: ZYLOPRIM  TAKE 1/2 TABLET BY MOUTH DAILY   amLODipine  5 MG tablet Commonly known as: NORVASC  TAKE 1 TABLET (5 MG TOTAL) BY MOUTH DAILY.   apixaban  5 MG Tabs tablet Commonly known as: Eliquis  Take 1 tablet (5  mg total) by mouth 2 (two) times daily.   famotidine  20 MG tablet Commonly known as: PEPCID  Take 20 mg by mouth at bedtime.   fluticasone -salmeterol 100-50 MCG/ACT Aepb Commonly known as: Wixela Inhub Inhale 1 puff into the lungs 2 (two) times daily.   gabapentin  300 MG capsule Commonly known as: NEURONTIN  Take 1 capsule (300 mg total) by mouth 3 (three) times daily.   mometasone  0.1 % lotion Commonly known as: ELOCON  Apply topically daily.   pantoprazole  40 MG tablet Commonly known as: PROTONIX  Take  40 mg by mouth every morning.   sildenafil  20 MG tablet Commonly known as: REVATIO  TAKE 3 TO 5 TABLETS BY MOUTH AS NEEDED   simvastatin  80 MG tablet Commonly known as: ZOCOR  TAKE 1 TABLET BY MOUTH EVERY DAY   valsartan  40 MG tablet Commonly known as: DIOVAN  Take 40 mg by mouth daily.        Disposition:  Home with usual follow up as in AVS  Signed, Daphne Barrack, NP-C, AGACNP-BC [facility] - Electrophysiology  07/31/2024, 6:18 PM  I have seen, examined the patient, and reviewed the above assessment and plan.    Hospital Course:  [name] is a 78 y.o. male with h/o complete heart block s/p pacemaker, non-obstructive CAD, HTN, HLD, atrial flutter who presented on 9/18 for planned Watchman device implant.  He underwent successful implantation of a 31 mm Watchman Flex Pro device.  He underwent usual postprocedural monitoring with no acute complications observed.  He was discharged with follow-up arranged.  General: Well developed, in no acute distress.  Neck: No JVD.  Cardiac: Normal rate, regular rhythm.  Resp: Normal work of breathing.  Ext: No edema.  Groins: Soft bilaterally without bleeding or hematoma Neuro: No gross focal deficits.  Psych: Normal affect.   Assessment and Plan:  Paroxysmal atrial flutter -Continue Eliquis  5mg  BID for 45 days then transition to DAPT with aspirin  81mg  and Plavix 75mg  once daily. Repeat imaging in 60 days with CT scan.   Duration of Discharge Encounter:  45 minutes  Fonda Kitty, MD 08/03/2024 3:22 PM

## 2024-08-01 ENCOUNTER — Encounter (HOSPITAL_COMMUNITY): Payer: Self-pay | Admitting: Cardiology

## 2024-08-01 MED FILL — Fentanyl Citrate Preservative Free (PF) Inj 100 MCG/2ML: INTRAMUSCULAR | Qty: 2 | Status: AC

## 2024-08-04 ENCOUNTER — Telehealth: Payer: Self-pay

## 2024-08-04 DIAGNOSIS — I480 Paroxysmal atrial fibrillation: Secondary | ICD-10-CM

## 2024-08-04 DIAGNOSIS — Z95818 Presence of other cardiac implants and grafts: Secondary | ICD-10-CM

## 2024-08-04 NOTE — Telephone Encounter (Signed)
  [facility]   Watchman Team  Contacted the patient regarding discharge from [facility] on 07/31/2024  The patient understands to follow up with Daphne Barrack on 09/15/2024   The patient understands discharge instructions? Yes  The patient understands medications and regimen? Yes   The patient reports he has not removed groin bandages. Instructed the patient to remove when he showers tonight. He did report no pain, swelling, or bleeding.   The patient understands to call with any questions or concerns prior to scheduled visit.

## 2024-08-07 ENCOUNTER — Encounter: Admitting: Physical Therapy

## 2024-08-07 ENCOUNTER — Other Ambulatory Visit (HOSPITAL_BASED_OUTPATIENT_CLINIC_OR_DEPARTMENT_OTHER): Payer: Self-pay

## 2024-08-07 MED ORDER — FLUZONE HIGH-DOSE 0.5 ML IM SUSY
0.5000 mL | PREFILLED_SYRINGE | Freq: Once | INTRAMUSCULAR | 0 refills | Status: AC
  Filled 2024-08-07: qty 0.5, 1d supply, fill #0

## 2024-08-08 ENCOUNTER — Encounter: Payer: Self-pay | Admitting: Internal Medicine

## 2024-08-10 ENCOUNTER — Ambulatory Visit: Payer: Self-pay | Admitting: Cardiology

## 2024-08-12 ENCOUNTER — Other Ambulatory Visit (HOSPITAL_BASED_OUTPATIENT_CLINIC_OR_DEPARTMENT_OTHER): Payer: Self-pay

## 2024-08-12 MED ORDER — COMIRNATY 30 MCG/0.3ML IM SUSY
0.3000 mL | PREFILLED_SYRINGE | Freq: Once | INTRAMUSCULAR | 0 refills | Status: AC
  Filled 2024-08-12: qty 0.3, 1d supply, fill #0

## 2024-08-14 ENCOUNTER — Ambulatory Visit: Admitting: Physical Therapy

## 2024-08-14 ENCOUNTER — Encounter: Payer: Self-pay | Admitting: Physical Therapy

## 2024-08-14 ENCOUNTER — Other Ambulatory Visit: Payer: Self-pay

## 2024-08-14 DIAGNOSIS — M6281 Muscle weakness (generalized): Secondary | ICD-10-CM | POA: Diagnosis not present

## 2024-08-14 DIAGNOSIS — M5459 Other low back pain: Secondary | ICD-10-CM

## 2024-08-14 NOTE — Therapy (Signed)
 OUTPATIENT PHYSICAL THERAPY TREATMENT   Patient Name: [name] MRN: [id number] DOB:02/07/1946, 78 y.o., male Today's Date: 08/14/2024   END OF SESSION:  PT End of Session - 08/14/24 1309     Visit Number 7    Number of Visits 12    Date for Recertification  09/10/24    Authorization Type [employer]    PT Start Time 1300    PT Stop Time 1342    PT Time Calculation (min) 42 min    Activity Tolerance Patient tolerated treatment well    Behavior During Therapy WFL for tasks assessed/performed                Past Medical History:  Diagnosis Date   Complete heart block (HCC)    S/P  PPM by Dr Kelsie   Coronary artery disease    Non-obstructive. Pt had left heart catheterization in August 2009 showing a 30% mid-circumflex lesion, otherwise coronaries were clean angiographically   Hyperlipidemia    Hypertension    OSA (obstructive sleep apnea)    Probable; study never scheduled   Pacemaker 04/04/2024   Presence of Watchman left atrial appendage closure device 07/31/2024   31 mm Watchman Device, JP   Past Surgical History:  Procedure Laterality Date   BIV UPGRADE N/A 04/04/2024   Procedure: BIV UPGRADE;  Surgeon: Mealor, Eulas BRAVO, MD;  Location: [facility] INVASIVE CV LAB;  Service: Cardiovascular;  Laterality: N/A;   CARDIAC CATHETERIZATION  1993, 2009   2009- negative   COLONOSCOPY  2011   negative, [facility]   LEFT ATRIAL APPENDAGE OCCLUSION N/A 07/31/2024   Procedure: LEFT ATRIAL APPENDAGE OCCLUSION;  Surgeon: Kennyth Chew, MD;  Location: [facility] INVASIVE CV LAB;  Service: Cardiovascular;  Laterality: N/A;   LEFT HEART CATH AND CORONARY ANGIOGRAPHY N/A 03/04/2024   Procedure: LEFT HEART CATH AND CORONARY ANGIOGRAPHY;  Surgeon: Wonda Sharper, MD;  Location: [facility] INVASIVE CV LAB;  Service: Cardiovascular;  Laterality: N/A;   PACEMAKER INSERTION  01/13/11   MDT by MILUS for complete heart block   PPM GENERATOR CHANGEOUT N/A 04/04/2024   Procedure: PPM GENERATOR  CHANGEOUT;  Surgeon: Nancey, Eulas BRAVO, MD;  Location: [facility] INVASIVE CV LAB;  Service: Cardiovascular;  Laterality: N/A;   TRANSESOPHAGEAL ECHOCARDIOGRAM (CATH LAB) N/A 07/31/2024   Procedure: TRANSESOPHAGEAL ECHOCARDIOGRAM;  Surgeon: Kennyth Chew, MD;  Location: [facility] INVASIVE CV LAB;  Service: Cardiovascular;  Laterality: N/A;   Patient Active Problem List   Diagnosis Date Noted   Presence of Watchman left atrial appendage closure device 07/31/2024   Persistent atrial fibrillation (HCC) 07/31/2024   Right leg swelling 07/28/2024   Pain in right lower leg 05/05/2024   Multiple skin tears 03/06/2024   CAD in native artery 02/14/2024   CKD (chronic kidney disease) stage 3, GFR 30-59 ml/min (HCC) 02/10/2024   Skin abrasion 08/30/2023   Cellulitis of left lower extremity 08/30/2023   Elevated PSA 03/14/2023   Elevated LFTs 03/14/2023   Atrial flutter (HCC) 02/13/2023   Chronic left SI joint pain 12/22/2022   DOE (dyspnea on exertion) 12/22/2022   Chest pain, atypical 10/10/2022   Upper airway cough syndrome 10/10/2022   Chronic cough 02/01/2022   Chronic right SI joint pain 05/11/2021   Umbilical hernia without obstruction or gangrene 02/02/2021   Hepatic steatosis 08/05/2020   Aortic atherosclerosis 08/04/2020   Complex regional pain syndrome type I of the upper limb 07/26/2020   Flexion contracture of joint of hand 07/26/2020   Pacemaker 07/26/2020  Rib pain on left side 06/09/2020   Decreased pulses in feet 06/09/2020   Erectile dysfunction 03/03/2017   Chronic headaches 02/13/2017   Snoring 02/15/2015   Elevated liver enzymes 09/13/2014   Dupuytren's contracture of both hands 09/02/2013   Stiffness of right hand joint 05/07/2013   Gout 12/27/2011   Essential hypertension 01/12/2011   Complete heart block (HCC) - PPM 01/12/2011   Hyperglycemia 03/02/2009   HYPERPLASIA PROSTATE UNS W/O UR OBST & OTH LUTS 08/13/2008   Hyperlipidemia 02/17/2008   PAROXYSMAL SUPRAVENTRICULAR  TACHYCARDIA 02/17/2008    PCP: Geofm Glade PARAS, MD  REFERRING PROVIDER: Joane Artist RAMAN, MD  REFERRING DIAG: Chronic bilateral low back pain without sciatica; Chronic SI joint pain; Chronic left shoulder pain  Rationale for Evaluation and Treatment: Rehabilitation  THERAPY DIAG:  Other low back pain  Muscle weakness (generalized)  ONSET DATE: Chronic   SUBJECTIVE:          SUBJECTIVE STATEMENT: Patient reports he hasn't had to ice or use a can the past 2 weeks, but he did have a procedure and states his back was killing him from lying on his back for a while.   Eval: Patient reports he became inactive years ago and they found that his SIJ are screwed up and they get better with shots. He has had a fall recently and had a new pacemaker put in. He reports that if he walks extended periods of time then he will be hurting right around the back of the hips. He states he fell while in the hospital and with his new pacemaker he is not allowed to lift more that 10 lbs with the left arm. He does report feeling slightly off balance. He always uses a hand rail when going up and down stairs.   PERTINENT HISTORY:  See PMH above  PAIN:  Are you having pain? Yes:  NPRS scale: 3/10 currently Pain location: Bilateral lower back and SIJ region Pain description: Stiff, locked-up Aggravating factors: Worse in morning, walking or standing extended periods, anything that requires bending Relieving factors: Patches or sprays, medication  PRECAUTIONS: Fall  WEIGHT BEARING RESTRICTIONS: No  FALLS:  Has patient fallen in last 6 months? Yes. Number of falls 1  PATIENT GOALS: Get more flexible and move better.    OBJECTIVE:  Note: Objective measures were completed at Evaluation unless otherwise noted. PATIENT SURVEYS:  Modified Oswestry: 11/50 (22% disability)  07/16/2024: 10/50 (20% disability)  MUSCLE LENGTH: Limitation with hamstring, quad and hip flexibility  POSTURE:  Rounded shoulder  posture, remains in slightly knee flexed/crouched position, decreased lumbar lordosis  PALPATION: Tender to palpation bilateral lumbar paraspinals, PSIS and SIJ region, piriformis region  Lumbar CPA hypomobile throughout  LUMBAR ROM:   AROM eval  Flexion Limited and tight  Extension Limited and painful  Right lateral flexion   Left lateral flexion   Right rotation Limited and tight  Left rotation Limited and tight   (Blank rows = not tested)  LOWER EXTREMITY ROM:    Hip PROM grossly limited in all directions with tightness reported at end ranges  LOWER EXTREMITY MMT:    MMT Right eval Left eval Rt / Lt 07/16/2024  Hip flexion 4- 4-   Hip extension 3- 3- 3- / 3-  Hip abduction 3 3 3  / 3  Hip adduction     Hip internal rotation     Hip external rotation     Knee flexion 4 4 4  / 4  Knee extension 4  4 4 / 4  Ankle dorsiflexion     Ankle plantarflexion     Ankle inversion     Ankle eversion      (Blank rows = not tested)  FUNCTIONAL TESTS:  5 times sit to stand: 15 seconds  07/16/2024: 15 seconds  Romberg stance: patient demonstrates greater difficulty with eyes closed   GAIT: Assistive device utilized: None Level of assistance: Complete Independence Comments: Remains in relative knee flexed/crouched, toe out, unsteady gait   TREATMENT [facility] Adult PT Treatment:                                                DATE: 08/14/2024 Recumbent bike L3 x 10 min to improve endurance and workload capacity IASTM for bilateral lumbar paraspinals using theragun with patient in seated and forward lean position Supine passive hamstring stretch x 20 sec each Supine passive hip stretching in all directions Modified thomas stretch 2 x 30 sec each Passive piriformis stretch 2 x 30 sec each Hooklying clamshell with black 2 x 10 x 5 sec  PATIENT EDUCATION:  Education details: HEP Person educated: Patient Education method: Programmer, multimedia, Demonstration, Actor cues, Verbal cues Education  comprehension: verbalized understanding, returned demonstration, verbal cues required, tactile cues required, and needs further education  HOME EXERCISE PROGRAM: Access Code: [email or web address]    ASSESSMENT: CLINICAL IMPRESSION: Patient tolerated therapy well with no adverse effects. Therapy focused primarily on improving his mobility and stretching to reduce lumbar and hip stiffness. He did have a recent heart procedure so declined lying prone and he does have increased lower back pain when transitioning from supine to sitting. He does report improvement in his mobility following therapy. He was able to tolerate some gluteal musculature activation using band. No changes made to his HEP this visit. Patient would benefit from continued skilled PT to progress mobility and strength in order to reduce pain and maximize functional ability.   Eval: Patient is a 78 y.o. male who was seen today for physical therapy evaluation and treatment for chronic lower back and pelvic pain, and recent fall.   OBJECTIVE IMPAIRMENTS: Abnormal gait, decreased activity tolerance, decreased balance, decreased ROM, decreased strength, impaired flexibility, postural dysfunction, and pain.   ACTIVITY LIMITATIONS: carrying, lifting, bending, standing, stairs, and locomotion level  PARTICIPATION LIMITATIONS: meal prep, cleaning, shopping, and community activity  PERSONAL FACTORS: Fitness, Past/current experiences, and Time since onset of injury/illness/exacerbation are also affecting patient's functional outcome.    GOALS: Goals reviewed with patient? Yes  SHORT TERM GOALS: Target date: 06/04/2024  Patient will be I with initial HEP in order to progress with therapy. Baseline: HEP provided at eval 06/11/2024: independent Goal status: MET  LONG TERM GOALS: Target date: 09/10/2024  Patient will be I with final HEP to maintain progress from PT. Baseline: HEP provided at eval 07/16/2024: progressing Goal status: ONGOING  2.   Patient will report Modified Oswestry </= 5/50 (10% disability) in order to indicate an improvement in their functional status Baseline: 11/50 (22%) 07/16/2024: 10/50 (20% disability) Goal status: ONGOING  3.  Patient will demonstrate 5xSTS </= 12 seconds to indicate improvement in strength and reduce fall risk Baseline: 15 seconds 07/16/2024: 15 seconds Goal status: ONGOING  4.  Patient will be able to walk for extended periods without need to stop due to pain in order to improve his functional ability Baseline: patient  reports frequent stops while walking due to pain 07/16/2024: reports continued limitation Goal status: ONGOING   PLAN: PT FREQUENCY: 1x/week  PT DURATION: 8 weeks  PLANNED INTERVENTIONS: 97164- PT Re-evaluation, 97750- Physical Performance Testing, 97110-Therapeutic exercises, 97530- Therapeutic activity, 97112- Neuromuscular re-education, 97535- Self Care, 02859- Manual therapy, 20560 (1-2 muscles), 20561 (3+ muscles)- Dry Needling, Patient/Family education, Balance training, Stair training, Taping, Joint mobilization, Joint manipulation, Spinal manipulation, Spinal mobilization, Cryotherapy, and Moist heat.  PLAN FOR NEXT SESSION: Review HEP and progress PRN, continue to work on LE and hip flexibility, progress LE, core, hip strengthening to improve activity tolerance, incorporate balance and stability exercises   Elaine Daring, PT, DPT, LAT, ATC 08/14/24  2:11 PM Phone: [phone] Fax: [phone]

## 2024-08-17 ENCOUNTER — Other Ambulatory Visit: Payer: Self-pay | Admitting: Internal Medicine

## 2024-08-22 ENCOUNTER — Other Ambulatory Visit

## 2024-08-22 ENCOUNTER — Ambulatory Visit: Admitting: Internal Medicine

## 2024-08-26 ENCOUNTER — Encounter: Admitting: Internal Medicine

## 2024-09-03 ENCOUNTER — Other Ambulatory Visit: Payer: Self-pay

## 2024-09-03 ENCOUNTER — Encounter: Payer: Self-pay | Admitting: Cardiology

## 2024-09-03 ENCOUNTER — Encounter: Payer: Self-pay | Admitting: Internal Medicine

## 2024-09-03 ENCOUNTER — Encounter: Payer: Self-pay | Admitting: Physical Therapy

## 2024-09-03 ENCOUNTER — Ambulatory Visit: Admitting: Physical Therapy

## 2024-09-03 DIAGNOSIS — M5459 Other low back pain: Secondary | ICD-10-CM

## 2024-09-03 DIAGNOSIS — M6281 Muscle weakness (generalized): Secondary | ICD-10-CM | POA: Diagnosis not present

## 2024-09-03 NOTE — Patient Instructions (Addendum)
 Blood work was ordered.       Medications changes include :   None    A referral was ordered and someone will call you to schedule an appointment.     Return in about 6 months (around 03/05/2025) for follow up.    Health Maintenance, Male Adopting a healthy lifestyle and getting preventive care are important in promoting health and wellness. Ask your health care provider about: The right schedule for you to have regular tests and exams. Things you can do on your own to prevent diseases and keep yourself healthy. What should I know about diet, weight, and exercise? Eat a healthy diet  Eat a diet that includes plenty of vegetables, fruits, low-fat dairy products, and lean protein. Do not eat a lot of foods that are high in solid fats, added sugars, or sodium. Maintain a healthy weight Body mass index (BMI) is a measurement that can be used to identify possible weight problems. It estimates body fat based on height and weight. Your health care provider can help determine your BMI and help you achieve or maintain a healthy weight. Get regular exercise Get regular exercise. This is one of the most important things you can do for your health. Most adults should: Exercise for at least 150 minutes each week. The exercise should increase your heart rate and make you sweat (moderate-intensity exercise). Do strengthening exercises at least twice a week. This is in addition to the moderate-intensity exercise. Spend less time sitting. Even light physical activity can be beneficial. Watch cholesterol and blood lipids Have your blood tested for lipids and cholesterol at 76 years of age, then have this test every 5 years. You may need to have your cholesterol levels checked more often if: Your lipid or cholesterol levels are high. You are older than 76 years of age. You are at high risk for heart disease. What should I know about cancer screening? Many types of cancers can be detected  early and may often be prevented. Depending on your health history and family history, you may need to have cancer screening at various ages. This may include screening for: Colorectal cancer. Prostate cancer. Skin cancer. Lung cancer. What should I know about heart disease, diabetes, and high blood pressure? Blood pressure and heart disease High blood pressure causes heart disease and increases the risk of stroke. This is more likely to develop in people who have high blood pressure readings or are overweight. Talk with your health care provider about your target blood pressure readings. Have your blood pressure checked: Every 3-5 years if you are 64-46 years of age. Every year if you are 25 years old or older. If you are between the ages of 68 and 1 and are a current or former smoker, ask your health care provider if you should have a one-time screening for abdominal aortic aneurysm (AAA). Diabetes Have regular diabetes screenings. This checks your fasting blood sugar level. Have the screening done: Once every three years after age 47 if you are at a normal weight and have a low risk for diabetes. More often and at a younger age if you are overweight or have a high risk for diabetes. What should I know about preventing infection? Hepatitis B If you have a higher risk for hepatitis B, you should be screened for this virus. Talk with your health care provider to find out if you are at risk for hepatitis B infection. Hepatitis C Blood testing is recommended  for: Everyone born from [birth year] through 1963. Anyone with known risk factors for hepatitis C. Sexually transmitted infections (STIs) You should be screened each year for STIs, including gonorrhea and chlamydia, if: You are sexually active and are younger than 76 years of age. You are older than 76 years of age and your health care provider tells you that you are at risk for this type of infection. Your sexual activity has changed since  you were last screened, and you are at increased risk for chlamydia or gonorrhea. Ask your health care provider if you are at risk. Ask your health care provider about whether you are at high risk for HIV. Your health care provider may recommend a prescription medicine to help prevent HIV infection. If you choose to take medicine to prevent HIV, you should first get tested for HIV. You should then be tested every 3 months for as long as you are taking the medicine. Follow these instructions at home: Alcohol use Do not drink alcohol if your health care provider tells you not to drink. If you drink alcohol: Limit how much you have to 0-2 drinks a day. Know how much alcohol is in your drink. In the [place] one drink equals one 12 oz bottle of beer (355 mL), one 5 oz glass of wine (148 mL), or one 1 oz glass of hard liquor (44 mL). Lifestyle Do not use any products that contain nicotine or tobacco. These products include cigarettes, chewing tobacco, and vaping devices, such as e-cigarettes. If you need help quitting, ask your health care provider. Do not use street drugs. Do not share needles. Ask your health care provider for help if you need support or information about quitting drugs. General instructions Schedule regular health, dental, and eye exams. Stay current with your vaccines. Tell your health care provider if: You often feel depressed. You have ever been abused or do not feel safe at home. Summary Adopting a healthy lifestyle and getting preventive care are important in promoting health and wellness. Follow your health care provider's instructions about healthy diet, exercising, and getting tested or screened for diseases. Follow your health care provider's instructions on monitoring your cholesterol and blood pressure. This information is not intended to replace advice given to you by your health care provider. Make sure you discuss any questions you have with your health care  provider. Document Revised: 03/21/2021 Document Reviewed: 03/21/2021 Elsevier Patient Education  2024 [employer].

## 2024-09-03 NOTE — Patient Instructions (Signed)
 Access Code: [email or web address] URL: [email or web address] Date: 09/03/2024 Prepared by: Elaine Daring  Exercises - Seated Hamstring Stretch  - 1 x daily - 3 reps - 15 seconds hold - Standing Hip Extension with Counter Support  - 1 x daily - 3 sets - 10 reps - Standing Hip Abduction with Counter Support  - 1 x daily - 3 sets - 10 reps - Standing Row with Anchored Resistance  - 1 x daily - 3 sets - 10 reps - Gastroc Stretch on Wall  - 1 x daily - 10 reps - 5 seconds hold

## 2024-09-03 NOTE — Therapy (Signed)
 OUTPATIENT PHYSICAL THERAPY TREATMENT   Patient Name: [name] MRN: [id number] DOB:1946-10-31, 78 y.o., male Today's Date: 09/03/2024   END OF SESSION:  PT End of Session - 09/03/24 1306     Visit Number 8    Number of Visits 12    Date for Recertification  09/10/24    Authorization Type [employer]    PT Start Time 1300    PT Stop Time 1340    PT Time Calculation (min) 40 min    Activity Tolerance Patient tolerated treatment well    Behavior During Therapy WFL for tasks assessed/performed                 Past Medical History:  Diagnosis Date   Complete heart block (HCC)    S/P  PPM by Dr Kelsie   Coronary artery disease    Non-obstructive. Pt had left heart catheterization in August 2009 showing a 30% mid-circumflex lesion, otherwise coronaries were clean angiographically   Hyperlipidemia    Hypertension    OSA (obstructive sleep apnea)    Probable; study never scheduled   Pacemaker 04/04/2024   Presence of Watchman left atrial appendage closure device 07/31/2024   31 mm Watchman Device, JP   Past Surgical History:  Procedure Laterality Date   BIV UPGRADE N/A 04/04/2024   Procedure: BIV UPGRADE;  Surgeon: Mealor, Eulas BRAVO, MD;  Location: [facility] INVASIVE CV LAB;  Service: Cardiovascular;  Laterality: N/A;   CARDIAC CATHETERIZATION  1993, 2009   2009- negative   COLONOSCOPY  2011   negative, [facility]   LEFT ATRIAL APPENDAGE OCCLUSION N/A 07/31/2024   Procedure: LEFT ATRIAL APPENDAGE OCCLUSION;  Surgeon: Kennyth Chew, MD;  Location: [facility] INVASIVE CV LAB;  Service: Cardiovascular;  Laterality: N/A;   LEFT HEART CATH AND CORONARY ANGIOGRAPHY N/A 03/04/2024   Procedure: LEFT HEART CATH AND CORONARY ANGIOGRAPHY;  Surgeon: Wonda Sharper, MD;  Location: [facility] INVASIVE CV LAB;  Service: Cardiovascular;  Laterality: N/A;   PACEMAKER INSERTION  01/13/11   MDT by MILUS for complete heart block   PPM GENERATOR CHANGEOUT N/A 04/04/2024   Procedure: PPM GENERATOR  CHANGEOUT;  Surgeon: Nancey, Eulas BRAVO, MD;  Location: [facility] INVASIVE CV LAB;  Service: Cardiovascular;  Laterality: N/A;   TRANSESOPHAGEAL ECHOCARDIOGRAM (CATH LAB) N/A 07/31/2024   Procedure: TRANSESOPHAGEAL ECHOCARDIOGRAM;  Surgeon: Kennyth Chew, MD;  Location: [facility] INVASIVE CV LAB;  Service: Cardiovascular;  Laterality: N/A;   Patient Active Problem List   Diagnosis Date Noted   Presence of Watchman left atrial appendage closure device 07/31/2024   Persistent atrial fibrillation (HCC) 07/31/2024   Right leg swelling 07/28/2024   Pain in right lower leg 05/05/2024   Multiple skin tears 03/06/2024   CAD in native artery 02/14/2024   CKD (chronic kidney disease) stage 3, GFR 30-59 ml/min (HCC) 02/10/2024   Skin abrasion 08/30/2023   Cellulitis of left lower extremity 08/30/2023   Elevated PSA 03/14/2023   Elevated LFTs 03/14/2023   Atrial flutter (HCC) 02/13/2023   Chronic left SI joint pain 12/22/2022   DOE (dyspnea on exertion) 12/22/2022   Chest pain, atypical 10/10/2022   Upper airway cough syndrome 10/10/2022   Chronic cough 02/01/2022   Chronic right SI joint pain 05/11/2021   Umbilical hernia without obstruction or gangrene 02/02/2021   Hepatic steatosis 08/05/2020   Aortic atherosclerosis 08/04/2020   Complex regional pain syndrome type I of the upper limb 07/26/2020   Flexion contracture of joint of hand 07/26/2020   Pacemaker 07/26/2020  Rib pain on left side 06/09/2020   Decreased pulses in feet 06/09/2020   Erectile dysfunction 03/03/2017   Chronic headaches 02/13/2017   Snoring 02/15/2015   Elevated liver enzymes 09/13/2014   Dupuytren's contracture of both hands 09/02/2013   Stiffness of right hand joint 05/07/2013   Gout 12/27/2011   Essential hypertension 01/12/2011   Complete heart block (HCC) - PPM 01/12/2011   Hyperglycemia 03/02/2009   HYPERPLASIA PROSTATE UNS W/O UR OBST & OTH LUTS 08/13/2008   Hyperlipidemia 02/17/2008   PAROXYSMAL SUPRAVENTRICULAR  TACHYCARDIA 02/17/2008    PCP: Geofm Glade PARAS, MD  REFERRING PROVIDER: Joane Artist RAMAN, MD  REFERRING DIAG: Chronic bilateral low back pain without sciatica; Chronic SI joint pain; Chronic left shoulder pain  Rationale for Evaluation and Treatment: Rehabilitation  THERAPY DIAG:  Other low back pain  Muscle weakness (generalized)  ONSET DATE: Chronic   SUBJECTIVE:          SUBJECTIVE STATEMENT: Patient reports he is doing pretty well today. He was at the beach and still has a lot of pain if he sleeps in a normal bed or is lying on his back.   Eval: Patient reports he became inactive years ago and they found that his SIJ are screwed up and they get better with shots. He has had a fall recently and had a new pacemaker put in. He reports that if he walks extended periods of time then he will be hurting right around the back of the hips. He states he fell while in the hospital and with his new pacemaker he is not allowed to lift more that 10 lbs with the left arm. He does report feeling slightly off balance. He always uses a hand rail when going up and down stairs.   PERTINENT HISTORY:  See PMH above  PAIN:  Are you having pain? Yes:  NPRS scale: 2/10 currently Pain location: Bilateral lower back and SIJ region Pain description: Stiff, locked-up Aggravating factors: Worse in morning, walking or standing extended periods, anything that requires bending Relieving factors: Patches or sprays, medication  PRECAUTIONS: Fall  WEIGHT BEARING RESTRICTIONS: No  FALLS:  Has patient fallen in last 6 months? Yes. Number of falls 1  PATIENT GOALS: Get more flexible and move better.    OBJECTIVE:  Note: Objective measures were completed at Evaluation unless otherwise noted. PATIENT SURVEYS:  Modified Oswestry: 11/50 (22% disability)  07/16/2024: 10/50 (20% disability)  MUSCLE LENGTH: Limitation with hamstring, quad and hip flexibility  POSTURE:  Rounded shoulder posture, remains in  slightly knee flexed/crouched position, decreased lumbar lordosis  PALPATION: Tender to palpation bilateral lumbar paraspinals, PSIS and SIJ region, piriformis region  Lumbar CPA hypomobile throughout  LUMBAR ROM:   AROM eval  Flexion Limited and tight  Extension Limited and painful  Right lateral flexion   Left lateral flexion   Right rotation Limited and tight  Left rotation Limited and tight   (Blank rows = not tested)  LOWER EXTREMITY ROM:    Hip PROM grossly limited in all directions with tightness reported at end ranges  LOWER EXTREMITY MMT:    MMT Right eval Left eval Rt / Lt 07/16/2024  Hip flexion 4- 4-   Hip extension 3- 3- 3- / 3-  Hip abduction 3 3 3  / 3  Hip adduction     Hip internal rotation     Hip external rotation     Knee flexion 4 4 4  / 4  Knee extension 4 4 4  /  4  Ankle dorsiflexion     Ankle plantarflexion     Ankle inversion     Ankle eversion      (Blank rows = not tested)  FUNCTIONAL TESTS:  5 times sit to stand: 15 seconds  07/16/2024: 15 seconds  Romberg stance: patient demonstrates greater difficulty with eyes closed   GAIT: Assistive device utilized: None Level of assistance: Complete Independence Comments: Remains in relative knee flexed/crouched, toe out, unsteady gait   TREATMENT [facility] Adult PT Treatment:                                                DATE: 09/03/2024 Recumbent bike L3 x 10 min to improve endurance and workload capacity IASTM for bilateral lumbar paraspinals using theragun with patient in seated and forward lean position Standing lumbar extension with hips against table 3 x 5 Standing hip extension and abduction with red at knees 2 x 10 each Seated thoracic extension over back of chair 3 x 5 Row with blue 2 x 10 Seated hamstring stretch 2 x 15 sec Standing calf and hip flexor stretch at wall 10 x 5 sec  PATIENT EDUCATION:  Education details: HEP update Person educated: Patient Education method: Explanation,  Demonstration, Tactile cues, Verbal cues, Handouts Education comprehension: verbalized understanding, returned demonstration, verbal cues required, tactile cues required, and needs further education  HOME EXERCISE PROGRAM: Access Code: [email or web address]    ASSESSMENT: CLINICAL IMPRESSION: Patient tolerated therapy well with no adverse effects. Therapy focused on improving his lumbar mobility and progressing hip strengthening with good tolerance. Incorporated lumbar and thoracic extension mobility and more standing strengthening exercises to improve patients standing and walking tolerance. Updated his HEP to progress hip strengthening for home. Patient would benefit from continued skilled PT to progress mobility and strength in order to reduce pain and maximize functional ability.   Eval: Patient is a 78 y.o. male who was seen today for physical therapy evaluation and treatment for chronic lower back and pelvic pain, and recent fall.   OBJECTIVE IMPAIRMENTS: Abnormal gait, decreased activity tolerance, decreased balance, decreased ROM, decreased strength, impaired flexibility, postural dysfunction, and pain.   ACTIVITY LIMITATIONS: carrying, lifting, bending, standing, stairs, and locomotion level  PARTICIPATION LIMITATIONS: meal prep, cleaning, shopping, and community activity  PERSONAL FACTORS: Fitness, Past/current experiences, and Time since onset of injury/illness/exacerbation are also affecting patient's functional outcome.    GOALS: Goals reviewed with patient? Yes  SHORT TERM GOALS: Target date: 06/04/2024  Patient will be I with initial HEP in order to progress with therapy. Baseline: HEP provided at eval 06/11/2024: independent Goal status: MET  LONG TERM GOALS: Target date: 09/10/2024  Patient will be I with final HEP to maintain progress from PT. Baseline: HEP provided at eval 07/16/2024: progressing Goal status: ONGOING  2.  Patient will report Modified Oswestry </= 5/50 (10%  disability) in order to indicate an improvement in their functional status Baseline: 11/50 (22%) 07/16/2024: 10/50 (20% disability) Goal status: ONGOING  3.  Patient will demonstrate 5xSTS </= 12 seconds to indicate improvement in strength and reduce fall risk Baseline: 15 seconds 07/16/2024: 15 seconds Goal status: ONGOING  4.  Patient will be able to walk for extended periods without need to stop due to pain in order to improve his functional ability Baseline: patient reports frequent stops while walking due to pain 07/16/2024: reports continued  limitation Goal status: ONGOING   PLAN: PT FREQUENCY: 1x/week  PT DURATION: 8 weeks  PLANNED INTERVENTIONS: 97164- PT Re-evaluation, 97750- Physical Performance Testing, 97110-Therapeutic exercises, 97530- Therapeutic activity, 97112- Neuromuscular re-education, 97535- Self Care, 02859- Manual therapy, 20560 (1-2 muscles), 20561 (3+ muscles)- Dry Needling, Patient/Family education, Balance training, Stair training, Taping, Joint mobilization, Joint manipulation, Spinal manipulation, Spinal mobilization, Cryotherapy, and Moist heat.  PLAN FOR NEXT SESSION: Review HEP and progress PRN, continue to work on LE and hip flexibility, progress LE, core, hip strengthening to improve activity tolerance, incorporate balance and stability exercises   Elaine Daring, PT, DPT, LAT, ATC 09/03/24  7:59 PM Phone: [phone] Fax: [phone]

## 2024-09-03 NOTE — Progress Notes (Unsigned)
 Subjective:    Patient ID: [name], male    DOB: 1945/12/03, 78 y.o.   MRN: [id number]     HPI [name] is here for a physical exam and his chronic medical problems.   Having dental implants.  Doing for PT for back.  Feels like he is getting stronger.  Back pain is better.     Medications and allergies reviewed with patient and updated if appropriate.  Current Outpatient Medications on File Prior to Visit  Medication Sig Dispense Refill   albuterol  (VENTOLIN  HFA) 108 (90 Base) MCG/ACT inhaler INHALE 1-2 PUFFS BY MOUTH INTO THE LUNGS EVERY 6 HOURS AS NEEDED. 8.5 each 2   allopurinol  (ZYLOPRIM ) 300 MG tablet TAKE 1/2 TABLET BY MOUTH DAILY 45 tablet 1   amLODipine  (NORVASC ) 5 MG tablet TAKE 1 TABLET (5 MG TOTAL) BY MOUTH DAILY. 90 tablet 1   apixaban  (ELIQUIS ) 5 MG TABS tablet Take 1 tablet (5 mg total) by mouth 2 (two) times daily.     famotidine  (PEPCID ) 20 MG tablet Take 20 mg by mouth at bedtime.     fluticasone -salmeterol (WIXELA INHUB) 100-50 MCG/ACT AEPB Inhale 1 puff into the lungs 2 (two) times daily. 60 each 5   gabapentin  (NEURONTIN ) 300 MG capsule TAKE 1 CAPSULE BY MOUTH THREE TIMES A DAY 270 capsule 2   mometasone  (ELOCON ) 0.1 % lotion Apply topically daily. 60 mL 5   pantoprazole  (PROTONIX ) 40 MG tablet Take 40 mg by mouth every morning.     sildenafil  (REVATIO ) 20 MG tablet TAKE 3 TO 5 TABLETS BY MOUTH AS NEEDED 100 tablet 3   simvastatin  (ZOCOR ) 80 MG tablet TAKE 1 TABLET BY MOUTH EVERY DAY 90 tablet 1   valsartan  (DIOVAN ) 40 MG tablet Take 40 mg by mouth daily.     No current facility-administered medications on file prior to visit.    Review of Systems  Constitutional:  Negative for fever.  Eyes:  Negative for visual disturbance.  Respiratory:  Positive for cough (chronic). Negative for shortness of breath and wheezing.   Cardiovascular:  Positive for leg swelling (improved with elevating leg, decreasing salt). Negative for chest pain and palpitations.   Gastrointestinal:  Positive for constipation (controlled). Negative for abdominal pain, blood in stool and diarrhea.       No gerd  Genitourinary:  Positive for frequency. Negative for difficulty urinating, dysuria and hematuria.  Musculoskeletal:  Positive for back pain (chronic). Negative for arthralgias.  Skin:  Negative for rash.  Neurological:  Positive for headaches (controlled). Negative for dizziness and light-headedness.  Psychiatric/Behavioral:  Negative for dysphoric mood. The patient is not nervous/anxious.        Objective:   Vitals:   09/04/24 0916  BP: 118/80  Pulse: 100  Temp: 98 F (36.7 C)  SpO2: 94%   Filed Weights   09/04/24 0916  Weight: 242 lb (109.8 kg)   Body mass index is 28.7 kg/m.  BP Readings from Last 3 Encounters:  09/04/24 118/80  07/31/24 133/73  07/28/24 128/78    Wt Readings from Last 3 Encounters:  09/04/24 242 lb (109.8 kg)  07/31/24 244 lb (110.7 kg)  07/28/24 243 lb (110.2 kg)      Physical Exam Constitutional: He appears well-developed and well-nourished. No distress.  HENT:  Head: Normocephalic and atraumatic.  Right Ear: External ear normal.  Left Ear: External ear normal.  Normal ear canals and TM b/l  Mouth/Throat: Oropharynx is clear and moist. Eyes: Conjunctivae and EOM  are normal.  Neck: Neck supple. No tracheal deviation present. No thyromegaly present.  No carotid bruit  Cardiovascular: Normal rate, regular rhythm, normal heart sounds and intact distal pulses.   No murmur heard.  No lower extremity edema. Pulmonary/Chest: Effort normal and breath sounds normal. No respiratory distress. He has no wheezes. He has no rales.  Abdominal: Soft. Ventral and umbilical hernia - nontender.  He exhibits no distension. There is no tenderness.  Genitourinary: deferred  Lymphadenopathy:   He has no cervical adenopathy.  Skin: Skin is warm and dry. He is not diaphoretic. Senile purpura on arms Psychiatric: He has a normal  mood and affect. His behavior is normal.         Assessment & Plan:   Physical exam: Screening blood work  ordered Exercise   doing PT Weight  is good Substance abuse   none   Reviewed recommended immunizations.   Health Maintenance  Topic Date Due   Medicare Annual Wellness (AWV)  09/26/2023   COVID-19 Vaccine (9 - Pfizer risk 2025-26 season) 02/09/2025   DTaP/Tdap/Td (3 - Td or Tdap) 12/15/2032   Pneumococcal Vaccine: 50+ Years  Completed   Influenza Vaccine  Completed   Hepatitis C Screening  Completed   Zoster Vaccines- Shingrix  Completed   Meningococcal B Vaccine  Aged Out   Lung Cancer Screening  Discontinued   Colonoscopy  Discontinued     See Problem List for Assessment and Plan of chronic medical problems.

## 2024-09-04 ENCOUNTER — Ambulatory Visit (INDEPENDENT_AMBULATORY_CARE_PROVIDER_SITE_OTHER): Admitting: Internal Medicine

## 2024-09-04 ENCOUNTER — Ambulatory Visit: Admitting: Family Medicine

## 2024-09-04 ENCOUNTER — Ambulatory Visit: Payer: Self-pay | Admitting: Internal Medicine

## 2024-09-04 ENCOUNTER — Other Ambulatory Visit: Payer: Self-pay

## 2024-09-04 VITALS — BP 118/80 | HR 100 | Temp 98.0°F | Ht 77.0 in | Wt 242.0 lb

## 2024-09-04 VITALS — BP 118/80 | HR 100 | Ht 77.0 in | Wt 242.0 lb

## 2024-09-04 DIAGNOSIS — Z0000 Encounter for general adult medical examination without abnormal findings: Secondary | ICD-10-CM | POA: Diagnosis not present

## 2024-09-04 DIAGNOSIS — I10 Essential (primary) hypertension: Secondary | ICD-10-CM

## 2024-09-04 DIAGNOSIS — E7800 Pure hypercholesterolemia, unspecified: Secondary | ICD-10-CM

## 2024-09-04 DIAGNOSIS — M533 Sacrococcygeal disorders, not elsewhere classified: Secondary | ICD-10-CM

## 2024-09-04 DIAGNOSIS — N1831 Chronic kidney disease, stage 3a: Secondary | ICD-10-CM

## 2024-09-04 DIAGNOSIS — Z125 Encounter for screening for malignant neoplasm of prostate: Secondary | ICD-10-CM

## 2024-09-04 DIAGNOSIS — R739 Hyperglycemia, unspecified: Secondary | ICD-10-CM | POA: Diagnosis not present

## 2024-09-04 DIAGNOSIS — I442 Atrioventricular block, complete: Secondary | ICD-10-CM

## 2024-09-04 DIAGNOSIS — Z95811 Presence of heart assist device: Secondary | ICD-10-CM | POA: Insufficient documentation

## 2024-09-04 DIAGNOSIS — G8929 Other chronic pain: Secondary | ICD-10-CM

## 2024-09-04 DIAGNOSIS — E559 Vitamin D deficiency, unspecified: Secondary | ICD-10-CM | POA: Diagnosis not present

## 2024-09-04 DIAGNOSIS — R519 Headache, unspecified: Secondary | ICD-10-CM

## 2024-09-04 DIAGNOSIS — M1A00X0 Idiopathic chronic gout, unspecified site, without tophus (tophi): Secondary | ICD-10-CM

## 2024-09-04 DIAGNOSIS — N529 Male erectile dysfunction, unspecified: Secondary | ICD-10-CM | POA: Diagnosis not present

## 2024-09-04 LAB — CBC
HCT: 42.7 % (ref 39.0–52.0)
Hemoglobin: 14.2 g/dL (ref 13.0–17.0)
MCHC: 33.3 g/dL (ref 30.0–36.0)
MCV: 104.4 fl — ABNORMAL HIGH (ref 78.0–100.0)
Platelets: 218 K/uL (ref 150.0–400.0)
RBC: 4.09 Mil/uL — ABNORMAL LOW (ref 4.22–5.81)
RDW: 13.7 % (ref 11.5–15.5)
WBC: 5.7 K/uL (ref 4.0–10.5)

## 2024-09-04 LAB — COMPREHENSIVE METABOLIC PANEL WITH GFR
ALT: 38 U/L (ref 0–53)
AST: 44 U/L — ABNORMAL HIGH (ref 0–37)
Albumin: 4.4 g/dL (ref 3.5–5.2)
Alkaline Phosphatase: 60 U/L (ref 39–117)
BUN: 18 mg/dL (ref 6–23)
CO2: 28 meq/L (ref 19–32)
Calcium: 9.8 mg/dL (ref 8.4–10.5)
Chloride: 100 meq/L (ref 96–112)
Creatinine, Ser: 1.12 mg/dL (ref 0.40–1.50)
GFR: 63.74 mL/min (ref >60.00)
Glucose, Bld: 114 mg/dL — ABNORMAL HIGH (ref 70–99)
Potassium: 4.4 meq/L (ref 3.5–5.1)
Sodium: 139 meq/L (ref 135–145)
Total Bilirubin: 0.5 mg/dL (ref 0.2–1.2)
Total Protein: 6.9 g/dL (ref 6.0–8.3)

## 2024-09-04 LAB — PSA, MEDICARE: PSA: 2.97 ng/mL (ref 0.10–4.00)

## 2024-09-04 LAB — LIPID PANEL
Cholesterol: 159 mg/dL (ref 0–200)
HDL: 58.7 mg/dL (ref >39.00)
LDL Cholesterol: 78 mg/dL (ref 0–99)
NonHDL: 100.53
Total CHOL/HDL Ratio: 3
Triglycerides: 114 mg/dL (ref 0.0–149.0)
VLDL: 22.8 mg/dL (ref 0.0–40.0)

## 2024-09-04 LAB — TSH: TSH: 1.9 u[IU]/mL (ref 0.35–5.50)

## 2024-09-04 LAB — URIC ACID: Uric Acid, Serum: 7.1 mg/dL (ref 4.0–7.8)

## 2024-09-04 LAB — HEMOGLOBIN A1C: Hgb A1c MFr Bld: 5.8 % (ref 4.6–6.5)

## 2024-09-04 LAB — VITAMIN D 25 HYDROXY (VIT D DEFICIENCY, FRACTURES): VITD: 33.48 ng/mL (ref 30.00–100.00)

## 2024-09-04 MED ORDER — SILDENAFIL CITRATE 20 MG PO TABS: ORAL_TABLET | ORAL | 3 refills | Status: DC

## 2024-09-04 MED ORDER — GABAPENTIN 300 MG PO CAPS: 300.0000 mg | ORAL_CAPSULE | Freq: Two times a day (BID) | ORAL | 2 refills | Status: AC

## 2024-09-04 MED ORDER — AMLODIPINE BESYLATE 5 MG PO TABS: 5.0000 mg | ORAL_TABLET | Freq: Every day | ORAL | 1 refills | Status: DC

## 2024-09-04 MED ORDER — ALLOPURINOL 300 MG PO TABS
150.0000 mg | ORAL_TABLET | Freq: Every day | ORAL | 1 refills | Status: AC
Start: 2024-09-04 — End: ?

## 2024-09-04 NOTE — Assessment & Plan Note (Signed)
Has PPM ?Following with cardiology ?

## 2024-09-04 NOTE — Assessment & Plan Note (Signed)
 Chronic Stage 3a, Stable CBC, CMP Stressed good blood pressure control Stressed good water intake

## 2024-09-04 NOTE — Assessment & Plan Note (Signed)
 Chronic Blood pressure controlled CBC, CMP Continue amlodipine  5 mg daily, valsartan  40 mg daily

## 2024-09-04 NOTE — Assessment & Plan Note (Signed)
Chronic.  Continue sildenafil as needed.

## 2024-09-04 NOTE — Patient Instructions (Addendum)
Thank you for coming in today.   You received an injection today. Seek immediate medical attention if the joint becomes red, extremely painful, or is oozing fluid.   Check back in 3 months 

## 2024-09-04 NOTE — Assessment & Plan Note (Signed)
 Chronic Lab Results  Component Value Date   HGBA1C 5.5 02/14/2024    Check A1c Low sugar / carb diet Stressed regular exercise

## 2024-09-04 NOTE — Assessment & Plan Note (Addendum)
 PPM in place for complete heart block Watchman in place for chronic Afib Follows with cardiology

## 2024-09-04 NOTE — Assessment & Plan Note (Signed)
 Chronic CT CAC 1550 Regular exercise and healthy diet encouraged Discussed goal of LDL < 70 Check lipids, CMP Continue simvastatin  80 mg daily   Lab Results  Component Value Date   LDLCALC 90 02/14/2024

## 2024-09-04 NOTE — Assessment & Plan Note (Addendum)
 Chronic Headaches controlled-has an occasional headache and may take Tylenol  Continue gabapentin  300 mg 2 times daily

## 2024-09-04 NOTE — Progress Notes (Signed)
 LILLETTE Ileana Collet, PhD, LAT, ATC acting as a scribe for Artist Lloyd, MD.  [name] is a 78 y.o. male who presents to [facility] today for 42-month f/u chronic low back/SI joint pain. Pt was last seen by Dr. Lloyd on 06/04/24 and was given repeat bilat SI joint steroid injections. Also referred to pain management. He's cont'd PT, completing 8 visits.  Today, pt reports he is making progress w/ his LBP. Less pain, but would like to increase his mobility. His pain is exacerbated by laying supine. He's been dealing w/ some heart issues, but is hoping to get in w/ pain management in Nov.  Dx imaging: 06/04/24 L-spine & pelvis XR  Pertinent review of systems: No fevers or chills  Relevant historical information: Heart block   Exam:  BP 118/80   Pulse 100   Ht 6' 5 (1.956 m)   Wt 242 lb (109.8 kg)   SpO2 94%   BMI 28.70 kg/m  General: Well Developed, well nourished, and in no acute distress.   MSK: L-spine normal-appearing Nontender palpation midline tender palpation bilateral SI joints.    Lab and Radiology Results  Procedure: Real-time Ultrasound Guided Injection of right SI joint Device: Philips Affiniti 50G/GE Logiq Images permanently stored and available for review in PACS Verbal informed consent obtained.  Discussed risks and benefits of procedure. Warned about infection, bleeding, hyperglycemia damage to structures among others. Patient expresses understanding and agreement Time-out conducted.   Noted no overlying erythema, induration, or other signs of local infection.   Skin prepped in a sterile fashion.   Local anesthesia: Topical Ethyl chloride.   With sterile technique and under real time ultrasound guidance: 40 mg of Depo-Medrol  and 2 mL of Marcaine injected into right SI joint. Fluid seen entering the joint capsule.   Completed without difficulty   Pain immediately resolved suggesting accurate placement of the medication.    Advised to call if fevers/chills, erythema, induration, drainage, or persistent bleeding.   Images permanently stored and available for review in the ultrasound unit.  Impression: Technically successful ultrasound guided injection.    Procedure: Real-time Ultrasound Guided Injection of left SI joint Device: Philips Affiniti 50G/GE Logiq Images permanently stored and available for review in PACS Verbal informed consent obtained.  Discussed risks and benefits of procedure. Warned about infection, bleeding, hyperglycemia damage to structures among others. Patient expresses understanding and agreement Time-out conducted.   Noted no overlying erythema, induration, or other signs of local infection.   Skin prepped in a sterile fashion.   Local anesthesia: Topical Ethyl chloride.   With sterile technique and under real time ultrasound guidance: 40 mg of Depo-Medrol  and 2 mL of Marcaine injected into left SI joint. Fluid seen entering the joint capsule.   Completed without difficulty   Pain immediately resolved suggesting accurate placement of the medication.   Advised to call if fevers/chills, erythema, induration, drainage, or persistent bleeding.   Images permanently stored and available for review in the ultrasound unit.  Impression: Technically successful ultrasound guided injection.        Assessment and Plan: 78 y.o. male with bilateral SI joint pain.  This is a exacerbation of a chronic problem.  Plan to continue PT.  Plan for injection today.  Check back as needed.   PDMP not reviewed this encounter. Orders Placed This Encounter  Procedures   US  LIMITED JOINT SPACE STRUCTURES LOW BILAT(NO LINKED CHARGES)    Reason for Exam (SYMPTOM  OR DIAGNOSIS REQUIRED):   bilateral SI joint pain    Preferred imaging location?:    [facility] [place]   No orders of the defined types were placed in this encounter.    Discussed warning signs or symptoms. Please see  discharge instructions. Patient expresses understanding.   The above documentation has been reviewed and is accurate and complete Artist Lloyd, M.D.

## 2024-09-04 NOTE — Assessment & Plan Note (Signed)
Chronic No issues with gout recently-controlled Continue allopurinol 150 mg daily Check uric acid level

## 2024-09-08 ENCOUNTER — Other Ambulatory Visit

## 2024-09-09 ENCOUNTER — Other Ambulatory Visit (HOSPITAL_COMMUNITY): Payer: Self-pay

## 2024-09-09 ENCOUNTER — Telehealth: Payer: Self-pay

## 2024-09-09 NOTE — Telephone Encounter (Signed)
 Pharmacy Patient Advocate Encounter   Received notification from Onbase that prior authorization for Sildenfil 20 mg tablets  is required/requested.   Insurance verification completed.   The patient is insured through [facility] ADVANTAGE/RX ADVANCE.   Per test claim: Per test claim, medication is not covered due to plan/benefit exclusion, PA not submitted at this time  *Part D exclusion, patient will have to pay out of pocket or use a good rx card.

## 2024-09-10 ENCOUNTER — Ambulatory Visit: Admitting: Physical Therapy

## 2024-09-10 ENCOUNTER — Other Ambulatory Visit: Payer: Self-pay

## 2024-09-10 ENCOUNTER — Encounter: Payer: Self-pay | Admitting: Physical Therapy

## 2024-09-10 DIAGNOSIS — M5459 Other low back pain: Secondary | ICD-10-CM | POA: Diagnosis not present

## 2024-09-10 DIAGNOSIS — M6281 Muscle weakness (generalized): Secondary | ICD-10-CM | POA: Diagnosis not present

## 2024-09-10 NOTE — Therapy (Signed)
 OUTPATIENT PHYSICAL THERAPY TREATMENT   Patient Name: [name] MRN: [id number] DOB:12/24/1945, 78 y.o., male Today's Date: 09/10/2024   END OF SESSION:  PT End of Session - 09/10/24 1256     Visit Number 9    Number of Visits 17    Date for Recertification  11/05/24    Authorization Type [employer]    PT Start Time 1300    PT Stop Time 1340    PT Time Calculation (min) 40 min    Activity Tolerance Patient tolerated treatment well    Behavior During Therapy WFL for tasks assessed/performed                  Past Medical History:  Diagnosis Date   Complete heart block (HCC)    S/P  PPM by Dr Kelsie   Coronary artery disease    Non-obstructive. Pt had left heart catheterization in August 2009 showing a 30% mid-circumflex lesion, otherwise coronaries were clean angiographically   Hyperlipidemia    Hypertension    OSA (obstructive sleep apnea)    Probable; study never scheduled   Pacemaker 04/04/2024   Presence of Watchman left atrial appendage closure device 07/31/2024   31 mm Watchman Device, JP   Past Surgical History:  Procedure Laterality Date   BIV UPGRADE N/A 04/04/2024   Procedure: BIV UPGRADE;  Surgeon: Mealor, Eulas BRAVO, MD;  Location: [facility] INVASIVE CV LAB;  Service: Cardiovascular;  Laterality: N/A;   CARDIAC CATHETERIZATION  1993, 2009   2009- negative   COLONOSCOPY  2011   negative, [facility]   LEFT ATRIAL APPENDAGE OCCLUSION N/A 07/31/2024   Procedure: LEFT ATRIAL APPENDAGE OCCLUSION;  Surgeon: Kennyth Chew, MD;  Location: [facility] INVASIVE CV LAB;  Service: Cardiovascular;  Laterality: N/A;   LEFT HEART CATH AND CORONARY ANGIOGRAPHY N/A 03/04/2024   Procedure: LEFT HEART CATH AND CORONARY ANGIOGRAPHY;  Surgeon: Wonda Sharper, MD;  Location: [facility] INVASIVE CV LAB;  Service: Cardiovascular;  Laterality: N/A;   PACEMAKER INSERTION  01/13/11   MDT by MILUS for complete heart block   PPM GENERATOR CHANGEOUT N/A 04/04/2024   Procedure: PPM  GENERATOR CHANGEOUT;  Surgeon: Nancey, Eulas BRAVO, MD;  Location: [facility] INVASIVE CV LAB;  Service: Cardiovascular;  Laterality: N/A;   TRANSESOPHAGEAL ECHOCARDIOGRAM (CATH LAB) N/A 07/31/2024   Procedure: TRANSESOPHAGEAL ECHOCARDIOGRAM;  Surgeon: Kennyth Chew, MD;  Location: [facility] INVASIVE CV LAB;  Service: Cardiovascular;  Laterality: N/A;   Patient Active Problem List   Diagnosis Date Noted   Presence of heart assist device (HCC) 09/04/2024   Presence of Watchman left atrial appendage closure device 07/31/2024   Persistent atrial fibrillation (HCC) 07/31/2024   Right leg swelling 07/28/2024   Pain in right lower leg 05/05/2024   CAD in native artery 02/14/2024   Chronic kidney disease, stage 3a (HCC) 02/10/2024   Cellulitis of left lower extremity 08/30/2023   Elevated LFTs 03/14/2023   Atrial flutter (HCC) 02/13/2023   Chronic left SI joint pain 12/22/2022   DOE (dyspnea on exertion) 12/22/2022   Chest pain, atypical 10/10/2022   Upper airway cough syndrome 10/10/2022   Chronic cough 02/01/2022   Chronic right SI joint pain 05/11/2021   Umbilical hernia without obstruction or gangrene 02/02/2021   Hepatic steatosis 08/05/2020   Aortic atherosclerosis 08/04/2020   Complex regional pain syndrome type I of the upper limb 07/26/2020   Flexion contracture of joint of hand 07/26/2020   Pacemaker 07/26/2020   Rib pain on left side 06/09/2020   Decreased  pulses in feet 06/09/2020   Erectile dysfunction 03/03/2017   Chronic headaches 02/13/2017   Snoring 02/15/2015   Dupuytren's contracture of both hands 09/02/2013   Stiffness of right hand joint 05/07/2013   Gout 12/27/2011   Essential hypertension 01/12/2011   Complete heart block (HCC) - PPM 01/12/2011   Prediabetes 03/02/2009   HYPERPLASIA PROSTATE UNS W/O UR OBST & OTH LUTS 08/13/2008   Hypercholesterolemia 02/17/2008   PAROXYSMAL SUPRAVENTRICULAR TACHYCARDIA 02/17/2008    PCP: Geofm Glade PARAS, MD  REFERRING PROVIDER: Joane Artist RAMAN, MD  REFERRING DIAG: Chronic bilateral low back pain without sciatica; Chronic SI joint pain; Chronic left shoulder pain  Rationale for Evaluation and Treatment: Rehabilitation  THERAPY DIAG:  Other low back pain  Muscle weakness (generalized)  ONSET DATE: Chronic   SUBJECTIVE:          SUBJECTIVE STATEMENT: Patient reports he is doing better, he got injections from the doctor at his last visit. He reports continued limitation with walking or standing extended periods.   Eval: Patient reports he became inactive years ago and they found that his SIJ are screwed up and they get better with shots. He has had a fall recently and had a new pacemaker put in. He reports that if he walks extended periods of time then he will be hurting right around the back of the hips. He states he fell while in the hospital and with his new pacemaker he is not allowed to lift more that 10 lbs with the left arm. He does report feeling slightly off balance. He always uses a hand rail when going up and down stairs.   PERTINENT HISTORY:  See PMH above  PAIN:  Are you having pain? Yes:  NPRS scale: 2/10 currently Pain location: Bilateral lower back and SIJ region Pain description: Stiff, locked-up Aggravating factors: Worse in morning, walking or standing extended periods, anything that requires bending Relieving factors: Patches or sprays, medication  PRECAUTIONS: Fall  WEIGHT BEARING RESTRICTIONS: No  FALLS:  Has patient fallen in last 6 months? Yes. Number of falls 1  PATIENT GOALS: Get more flexible and move better.    OBJECTIVE:  Note: Objective measures were completed at Evaluation unless otherwise noted. PATIENT SURVEYS:  Modified Oswestry: 11/50 (22% disability)  07/16/2024: 10/50 (20% disability) 09/10/2024: 8/50 (16% disability)  MUSCLE LENGTH: Limitation with hamstring, quad and hip flexibility  POSTURE:  Rounded shoulder posture, remains in slightly knee flexed/crouched  position, decreased lumbar lordosis  PALPATION: Tender to palpation bilateral lumbar paraspinals, PSIS and SIJ region, piriformis region  Lumbar CPA hypomobile throughout  LUMBAR ROM:   AROM eval  Flexion Limited and tight  Extension Limited and painful  Right lateral flexion   Left lateral flexion   Right rotation Limited and tight  Left rotation Limited and tight   (Blank rows = not tested)  LOWER EXTREMITY ROM:    Hip PROM grossly limited in all directions with tightness reported at end ranges  LOWER EXTREMITY MMT:    MMT Right eval Left eval Rt / Lt 07/16/2024  Hip flexion 4- 4-   Hip extension 3- 3- 3- / 3-  Hip abduction 3 3 3  / 3  Hip adduction     Hip internal rotation     Hip external rotation     Knee flexion 4 4 4  / 4  Knee extension 4 4 4  / 4  Ankle dorsiflexion     Ankle plantarflexion     Ankle inversion  Ankle eversion      (Blank rows = not tested)  FUNCTIONAL TESTS:  5 times sit to stand: 15 seconds  07/16/2024: 15 seconds  09/10/2024: 14 seconds  Romberg stance: patient demonstrates greater difficulty with eyes closed   GAIT: Assistive device utilized: None Level of assistance: Complete Independence Comments: Remains in relative knee flexed/crouched, toe out, unsteady gait   TREATMENT [facility] Adult PT Treatment:                                                DATE: 09/10/2024 Recumbent bike L4 x 10 min to improve endurance and workload capacity IASTM for bilateral lumbar paraspinals using theragun with patient in seated and forward lean position Standing hip flexor stretch / pelvic rotation with front foot on 12 box 2 x 5 x 5 sec each Seated thoracic extension over back of chair with towel roll 3 x 5  Standing hip abduction with red at knees 2 x 10 each Hip extension bent over supported on elevated table with red at knees 2 x 10 each Row with blue 2 x 10 Pallof press with L2 powerband x 10 each  PATIENT EDUCATION:  Education details: POC  extension, HEP Person educated: Patient Education method: Explanation, Demonstration, Tactile cues, Verbal cues Education comprehension: verbalized understanding, returned demonstration, verbal cues required, tactile cues required, and needs further education  HOME EXERCISE PROGRAM: Access Code: [email or web address]    ASSESSMENT: CLINICAL IMPRESSION: Patient tolerated therapy well with no adverse effects. He reports improvement in his functional status and does exhibit slight improvement in his 5xSTS indicating improved strength and mobility. He does report continued limitations in his walking and mobility, with difficulty lying in supine position due to pain. Therapy focused on improving his lumbar mobility and flexibility, and progressing his core and hip strength with good tolerance. No changes to is HEP this visit. Patient would benefit from continued skilled PT to progress mobility and strength in order to reduce pain and maximize functional ability, so will extend PT POC for 8 more weeks.   Eval: Patient is a 78 y.o. male who was seen today for physical therapy evaluation and treatment for chronic lower back and pelvic pain, and recent fall.   OBJECTIVE IMPAIRMENTS: Abnormal gait, decreased activity tolerance, decreased balance, decreased ROM, decreased strength, impaired flexibility, postural dysfunction, and pain.   ACTIVITY LIMITATIONS: carrying, lifting, bending, standing, stairs, and locomotion level  PARTICIPATION LIMITATIONS: meal prep, cleaning, shopping, and community activity  PERSONAL FACTORS: Fitness, Past/current experiences, and Time since onset of injury/illness/exacerbation are also affecting patient's functional outcome.    GOALS: Goals reviewed with patient? Yes  SHORT TERM GOALS: Target date: 06/04/2024  Patient will be I with initial HEP in order to progress with therapy. Baseline: HEP provided at eval 06/11/2024: independent Goal status: MET  LONG TERM GOALS: Target  date: 11/05/2024  Patient will be I with final HEP to maintain progress from PT. Baseline: HEP provided at eval 07/16/2024: progressing 09/10/2024: progressing Goal status: ONGOING  2.  Patient will report Modified Oswestry </= 5/50 (10% disability) in order to indicate an improvement in their functional status Baseline: 11/50 (22%) 07/16/2024: 10/50 (20% disability) 09/10/2024: 8/50 (16% disability) Goal status: ONGOING  3.  Patient will demonstrate 5xSTS </= 12 seconds to indicate improvement in strength and reduce fall risk Baseline: 15 seconds 07/16/2024: 15 seconds 09/10/2024: 14  seconds Goal status: ONGOING  4.  Patient will be able to walk for extended periods without need to stop due to pain in order to improve his functional ability Baseline: patient reports frequent stops while walking due to pain 07/16/2024: reports continued limitation 09/10/2024: reports continued limitation Goal status: ONGOING   PLAN: PT FREQUENCY: 1-2x/week  PT DURATION: 8 weeks  PLANNED INTERVENTIONS: 97164- PT Re-evaluation, 97750- Physical Performance Testing, 97110-Therapeutic exercises, 97530- Therapeutic activity, 97112- Neuromuscular re-education, 97535- Self Care, 02859- Manual therapy, 20560 (1-2 muscles), 20561 (3+ muscles)- Dry Needling, Patient/Family education, Balance training, Stair training, Taping, Joint mobilization, Joint manipulation, Spinal manipulation, Spinal mobilization, Cryotherapy, and Moist heat.  PLAN FOR NEXT SESSION: Review HEP and progress PRN, continue to work on LE and hip flexibility, progress LE, core, hip strengthening to improve activity tolerance, incorporate balance and stability exercises   Elaine Daring, PT, DPT, LAT, ATC 09/10/24  2:13 PM Phone: [phone] Fax: [phone]

## 2024-09-11 ENCOUNTER — Other Ambulatory Visit: Payer: Self-pay

## 2024-09-11 MED ORDER — SILDENAFIL CITRATE 20 MG PO TABS: ORAL_TABLET | ORAL | 3 refills | Status: AC

## 2024-09-13 NOTE — Progress Notes (Unsigned)
  Electrophysiology Office Note:   Date:  09/15/2024  ID:  KYRELL RUACHO, DOB 13-Dec-1945, MRN [id number]  Primary Cardiologist: None Primary Heart Failure: None Electrophysiologist: [name] Furbish, MD       History of Present Illness:   [name] is a 78 y.o. male with h/o CHB s/p PPM > HFrEF with upgrade to CRT, AFL, non-obs CAD, HTN, HLD seen today for routine electrophysiology follow-up s/p LAA occlusion / Watchman.  Since last being seen in our clinic the patient reports doing well overall. He is pleased his is able to come off Eliquis . Denies missed doses / bleeding issues. He relays stories about how difficult it is to get labs and he is not please with [facility].    He denies chest pain, palpitations, dyspnea, PND, orthopnea, nausea, vomiting, dizziness, syncope, edema, weight gain, or early satiety.    Review of systems complete and found to be negative unless listed in HPI.   EP Information / Studies Reviewed:    EKG is not ordered today. EKG from 07/22/24 reviewed which showed ASVP 99 bpm      PPM Interrogation- device check deferred given recent in clinic check with Dr. Furbish  Device History: Medtronic Dual Chamber PPM implanted 01/13/11 for CHB > low EF in 2025 and upgraded to CRT 04/04/24    Arrhythmia / AAD / Pertinent EP Studies AFL  LAAO 07/31/24 > implant of 31 mm Watchman FLX Pro    Risk Assessment/Calculations:    CHA2DS2-VASc Score = 4   This indicates a 4.8% annual risk of stroke. The patient's score is based upon: CHF History: 0 HTN History: 1 Diabetes History: 0 Stroke History: 0 Vascular Disease History: 1 Age Score: 2 Gender Score: 0             Physical Exam:   VS:  BP 131/76   Pulse 93   Ht 6' 5 (1.956 m)   Wt 240 lb (108.9 kg)   SpO2 95%   BMI 28.46 kg/m    Wt Readings from Last 3 Encounters:  09/15/24 240 lb (108.9 kg)  09/04/24 242 lb (109.8 kg)  09/04/24 242 lb (109.8 kg)     GEN: Well nourished, well developed in no  acute distress NECK: No JVD; No carotid bruits CARDIAC: Regular rate and rhythm, no murmurs, rubs, gallops RESPIRATORY:  Clear to auscultation without rales, wheezing or rhonchi  ABDOMEN: Soft, non-tender, non-distended EXTREMITIES:  No edema; No deformity   ASSESSMENT AND PLAN:    Paroxysmal Atrial Flutter  Presence of LAA Occlusion Device  -post implant anticoagulation strategy > Eliquis  for 45 days (through 11/2), then transition to DAPT with ASA 81 mg and Plavix 75mg  daily to complete medical therapy (through 01/28/25) -CT to assess device patency and for leak  -Pre-CT labs > BMP   -dental SBE prophylaxis with Amoxicillin   CHB / HFrEF s/p Medtronic CRT-P -Normal in clinic device check 06/2024 / PPM function -check deferred given recent in clinic review    Disposition:   Follow up with EP APP in March at the 6 month mark post LAAO    Signed, Daphne Barrack, NP-C, AGACNP-BC [facility] - Electrophysiology  09/15/2024, 10:30 AM

## 2024-09-15 ENCOUNTER — Encounter: Payer: Self-pay | Admitting: Pulmonary Disease

## 2024-09-15 ENCOUNTER — Ambulatory Visit: Attending: Pulmonary Disease | Admitting: Pulmonary Disease

## 2024-09-15 VITALS — BP 131/76 | HR 93 | Ht 77.0 in | Wt 240.0 lb

## 2024-09-15 DIAGNOSIS — I5020 Unspecified systolic (congestive) heart failure: Secondary | ICD-10-CM

## 2024-09-15 DIAGNOSIS — I4892 Unspecified atrial flutter: Secondary | ICD-10-CM | POA: Diagnosis not present

## 2024-09-15 DIAGNOSIS — Z95818 Presence of other cardiac implants and grafts: Secondary | ICD-10-CM

## 2024-09-15 DIAGNOSIS — I442 Atrioventricular block, complete: Secondary | ICD-10-CM

## 2024-09-15 MED ORDER — ASPIRIN 81 MG PO TBEC: 81.0000 mg | DELAYED_RELEASE_TABLET | Freq: Every day | ORAL | Status: AC

## 2024-09-15 MED ORDER — CLOPIDOGREL BISULFATE 75 MG PO TABS: 75.0000 mg | ORAL_TABLET | Freq: Every day | ORAL | 1 refills | Status: AC

## 2024-09-15 MED ORDER — AMOXICILLIN 500 MG PO TABS: 2000.0000 mg | ORAL_TABLET | ORAL | 1 refills | Status: DC | PRN

## 2024-09-15 NOTE — Patient Instructions (Addendum)
 Medication Instructions:  Take last dose of eliquis  tonight Start plavix 75 mg daily tomorrow 09/16/24 Start over the counter aspirin  81 mg tomorrow 09/16/24 Take amoxicillin 2000 mg (4 tablets) 1 hour prior to all dental visits *If you need a refill on your cardiac medications before your next appointment, please call your pharmacy*  Lab Work: BMET-TODAY If you have labs (blood work) drawn today and your tests are completely normal, you will receive your results only by: MyChart Message (if you have MyChart) OR A paper copy in the mail If you have any lab test that is abnormal or we need to change your treatment, we will call you to review the results.  Testing/Procedures: See letter  Follow-Up: At [facility], you and your health needs are our priority.  As part of our continuing mission to provide you with exceptional heart care, our providers are all part of one team.  This team includes your primary Cardiologist (physician) and Advanced Practice Providers or APPs (Physician Assistants and Nurse Practitioners) who all work together to provide you with the care you need, when you need it.  Your next appointment:   01/29/25 at 10:05 AM  Provider:   Charlies Arthur, PA-C

## 2024-09-16 ENCOUNTER — Ambulatory Visit: Payer: Self-pay | Admitting: Pulmonary Disease

## 2024-09-16 LAB — BASIC METABOLIC PANEL WITH GFR
BUN/Creatinine Ratio: 20 (ref 10–24)
BUN: 27 mg/dL (ref 8–27)
CO2: 24 mmol/L (ref 20–29)
Calcium: 10.1 mg/dL (ref 8.6–10.2)
Chloride: 98 mmol/L (ref 96–106)
Creatinine, Ser: 1.35 mg/dL — ABNORMAL HIGH (ref 0.76–1.27)
Glucose: 86 mg/dL (ref 70–99)
Potassium: 4.7 mmol/L (ref 3.5–5.2)
Sodium: 138 mmol/L (ref 134–144)
eGFR: 54 mL/min/1.73 — ABNORMAL LOW (ref >59)

## 2024-09-17 ENCOUNTER — Ambulatory Visit: Admitting: Physical Therapy

## 2024-09-17 ENCOUNTER — Encounter: Payer: Self-pay | Admitting: Physical Therapy

## 2024-09-17 ENCOUNTER — Other Ambulatory Visit: Payer: Self-pay

## 2024-09-17 DIAGNOSIS — M5459 Other low back pain: Secondary | ICD-10-CM

## 2024-09-17 DIAGNOSIS — M6281 Muscle weakness (generalized): Secondary | ICD-10-CM | POA: Diagnosis not present

## 2024-09-17 NOTE — Therapy (Signed)
 OUTPATIENT PHYSICAL THERAPY TREATMENT   Patient Name: [name] MRN: [id number] DOB:08/29/1946, 78 y.o., male Today's Date: 09/17/2024   END OF SESSION:  PT End of Session - 09/17/24 1023     Visit Number 10    Number of Visits 17    Date for Recertification  11/05/24    Authorization Type [employer]    PT Start Time 1015    PT Stop Time 1100    PT Time Calculation (min) 45 min    Activity Tolerance Patient tolerated treatment well    Behavior During Therapy WFL for tasks assessed/performed                   Past Medical History:  Diagnosis Date   Complete heart block (HCC)    S/P  PPM by Dr Kelsie   Coronary artery disease    Non-obstructive. Pt had left heart catheterization in August 2009 showing a 30% mid-circumflex lesion, otherwise coronaries were clean angiographically   Hyperlipidemia    Hypertension    OSA (obstructive sleep apnea)    Probable; study never scheduled   Pacemaker 04/04/2024   Presence of Watchman left atrial appendage closure device 07/31/2024   31 mm Watchman Device, JP   Past Surgical History:  Procedure Laterality Date   BIV UPGRADE N/A 04/04/2024   Procedure: BIV UPGRADE;  Surgeon: Mealor, Eulas BRAVO, MD;  Location: [facility] INVASIVE CV LAB;  Service: Cardiovascular;  Laterality: N/A;   CARDIAC CATHETERIZATION  1993, 2009   2009- negative   COLONOSCOPY  2011   negative, [facility]   LEFT ATRIAL APPENDAGE OCCLUSION N/A 07/31/2024   Procedure: LEFT ATRIAL APPENDAGE OCCLUSION;  Surgeon: Kennyth Chew, MD;  Location: [facility] INVASIVE CV LAB;  Service: Cardiovascular;  Laterality: N/A;   LEFT HEART CATH AND CORONARY ANGIOGRAPHY N/A 03/04/2024   Procedure: LEFT HEART CATH AND CORONARY ANGIOGRAPHY;  Surgeon: Wonda Sharper, MD;  Location: [facility] INVASIVE CV LAB;  Service: Cardiovascular;  Laterality: N/A;   PACEMAKER INSERTION  01/13/11   MDT by MILUS for complete heart block   PPM GENERATOR CHANGEOUT N/A 04/04/2024   Procedure: PPM  GENERATOR CHANGEOUT;  Surgeon: Nancey, Eulas BRAVO, MD;  Location: [facility] INVASIVE CV LAB;  Service: Cardiovascular;  Laterality: N/A;   TRANSESOPHAGEAL ECHOCARDIOGRAM (CATH LAB) N/A 07/31/2024   Procedure: TRANSESOPHAGEAL ECHOCARDIOGRAM;  Surgeon: Kennyth Chew, MD;  Location: [facility] INVASIVE CV LAB;  Service: Cardiovascular;  Laterality: N/A;   Patient Active Problem List   Diagnosis Date Noted   Presence of heart assist device (HCC) 09/04/2024   Presence of Watchman left atrial appendage closure device 07/31/2024   Persistent atrial fibrillation (HCC) 07/31/2024   Right leg swelling 07/28/2024   Pain in right lower leg 05/05/2024   CAD in native artery 02/14/2024   Chronic kidney disease, stage 3a (HCC) 02/10/2024   Cellulitis of left lower extremity 08/30/2023   Elevated LFTs 03/14/2023   Atrial flutter (HCC) 02/13/2023   Chronic left SI joint pain 12/22/2022   DOE (dyspnea on exertion) 12/22/2022   Chest pain, atypical 10/10/2022   Upper airway cough syndrome 10/10/2022   Chronic cough 02/01/2022   Chronic right SI joint pain 05/11/2021   Umbilical hernia without obstruction or gangrene 02/02/2021   Hepatic steatosis 08/05/2020   Aortic atherosclerosis 08/04/2020   Complex regional pain syndrome type I of the upper limb 07/26/2020   Flexion contracture of joint of hand 07/26/2020   Pacemaker 07/26/2020   Rib pain on left side 06/09/2020  Decreased pulses in feet 06/09/2020   Erectile dysfunction 03/03/2017   Chronic headaches 02/13/2017   Snoring 02/15/2015   Dupuytren's contracture of both hands 09/02/2013   Stiffness of right hand joint 05/07/2013   Gout 12/27/2011   Essential hypertension 01/12/2011   Complete heart block (HCC) - PPM 01/12/2011   Prediabetes 03/02/2009   HYPERPLASIA PROSTATE UNS W/O UR OBST & OTH LUTS 08/13/2008   Hypercholesterolemia 02/17/2008   PAROXYSMAL SUPRAVENTRICULAR TACHYCARDIA 02/17/2008    PCP: Geofm Glade PARAS, MD  REFERRING PROVIDER: Joane Artist RAMAN, MD  REFERRING DIAG: Chronic bilateral low back pain without sciatica; Chronic SI joint pain; Chronic left shoulder pain  Rationale for Evaluation and Treatment: Rehabilitation  THERAPY DIAG:  Other low back pain  Muscle weakness (generalized)  ONSET DATE: Chronic   SUBJECTIVE:          SUBJECTIVE STATEMENT: Patient reports he has been feeling better and has been getting some sleep at night. Reports the pain is more of an ache rather than a sharp pain. He is getting in/out of the car better and walking a little further than he was. He can also go down the stairs reciprocally now.  Eval: Patient reports he became inactive years ago and they found that his SIJ are screwed up and they get better with shots. He has had a fall recently and had a new pacemaker put in. He reports that if he walks extended periods of time then he will be hurting right around the back of the hips. He states he fell while in the hospital and with his new pacemaker he is not allowed to lift more that 10 lbs with the left arm. He does report feeling slightly off balance. He always uses a hand rail when going up and down stairs.   PERTINENT HISTORY:  See PMH above  PAIN:  Are you having pain? Yes:  NPRS scale: 1-2/10 currently Pain location: Bilateral lower back and SIJ region Pain description: Stiff, ache Aggravating factors: Worse in morning, walking or standing extended periods, anything that requires bending Relieving factors: Patches or sprays, medication  PRECAUTIONS: Fall  WEIGHT BEARING RESTRICTIONS: No  FALLS:  Has patient fallen in last 6 months? Yes. Number of falls 1  PATIENT GOALS: Get more flexible and move better.    OBJECTIVE:  Note: Objective measures were completed at Evaluation unless otherwise noted. PATIENT SURVEYS:  Modified Oswestry: 11/50 (22% disability)  07/16/2024: 10/50 (20% disability) 09/10/2024: 8/50 (16% disability)  MUSCLE LENGTH: Limitation with hamstring,  quad and hip flexibility  POSTURE:  Rounded shoulder posture, remains in slightly knee flexed/crouched position, decreased lumbar lordosis  PALPATION: Tender to palpation bilateral lumbar paraspinals, PSIS and SIJ region, piriformis region  Lumbar CPA hypomobile throughout  LUMBAR ROM:   AROM eval  Flexion Limited and tight  Extension Limited and painful  Right lateral flexion   Left lateral flexion   Right rotation Limited and tight  Left rotation Limited and tight   (Blank rows = not tested)  LOWER EXTREMITY ROM:    Hip PROM grossly limited in all directions with tightness reported at end ranges  LOWER EXTREMITY MMT:    MMT Right eval Left eval Rt / Lt 07/16/2024  Hip flexion 4- 4-   Hip extension 3- 3- 3- / 3-  Hip abduction 3 3 3  / 3  Hip adduction     Hip internal rotation     Hip external rotation     Knee flexion 4 4 4  /  4  Knee extension 4 4 4  / 4  Ankle dorsiflexion     Ankle plantarflexion     Ankle inversion     Ankle eversion      (Blank rows = not tested)  FUNCTIONAL TESTS:  5 times sit to stand: 15 seconds  07/16/2024: 15 seconds  09/10/2024: 14 seconds  Romberg stance: patient demonstrates greater difficulty with eyes closed   GAIT: Assistive device utilized: None Level of assistance: Complete Independence Comments: Remains in relative knee flexed/crouched, toe out, unsteady gait   TREATMENT [facility] Adult PT Treatment:                                                DATE: 09/17/2024 Recumbent bike L4 x 10 min to improve endurance and workload capacity IASTM for bilateral lumbar paraspinals using theragun with patient in seated and forward lean position Seated lumbar stretch with stability ball fwd and diagonal x 5 Seated thoracic extension over back of chair x 10 Standing hip flexor stretch / pelvic rotation with front foot on 12 box 10 x 5 sec each Sit to stand from elevated table holding 15# at chest 3 x 10 Standing hip abduction with red at  knees 3 x 10 each  PATIENT EDUCATION:  Education details: HEP Person educated: Patient Education method: Programmer, Multimedia, Demonstration, Actor cues, Verbal cues Education comprehension: verbalized understanding, returned demonstration, verbal cues required, tactile cues required, and needs further education  HOME EXERCISE PROGRAM: Access Code: [email or web address]    ASSESSMENT: CLINICAL IMPRESSION: Patient tolerated therapy well with no adverse effects. Therapy continues to focus on improving his mobility and progressing strength with good tolerance. Incorporated weighted squat movement and continued with hip strengthening. He does report greater difficulty with hip strengthening on the right. No changes to is HEP this visit. Patient would benefit from continued skilled PT to progress mobility and strength in order to reduce pain and maximize functional ability.   Eval: Patient is a 78 y.o. male who was seen today for physical therapy evaluation and treatment for chronic lower back and pelvic pain, and recent fall.   OBJECTIVE IMPAIRMENTS: Abnormal gait, decreased activity tolerance, decreased balance, decreased ROM, decreased strength, impaired flexibility, postural dysfunction, and pain.   ACTIVITY LIMITATIONS: carrying, lifting, bending, standing, stairs, and locomotion level  PARTICIPATION LIMITATIONS: meal prep, cleaning, shopping, and community activity  PERSONAL FACTORS: Fitness, Past/current experiences, and Time since onset of injury/illness/exacerbation are also affecting patient's functional outcome.    GOALS: Goals reviewed with patient? Yes  SHORT TERM GOALS: Target date: 06/04/2024  Patient will be I with initial HEP in order to progress with therapy. Baseline: HEP provided at eval 06/11/2024: independent Goal status: MET  LONG TERM GOALS: Target date: 11/05/2024  Patient will be I with final HEP to maintain progress from PT. Baseline: HEP provided at eval 07/16/2024:  progressing 09/10/2024: progressing Goal status: ONGOING  2.  Patient will report Modified Oswestry </= 5/50 (10% disability) in order to indicate an improvement in their functional status Baseline: 11/50 (22%) 07/16/2024: 10/50 (20% disability) 09/10/2024: 8/50 (16% disability) Goal status: ONGOING  3.  Patient will demonstrate 5xSTS </= 12 seconds to indicate improvement in strength and reduce fall risk Baseline: 15 seconds 07/16/2024: 15 seconds 09/10/2024: 14 seconds Goal status: ONGOING  4.  Patient will be able to walk for extended periods without need to stop  due to pain in order to improve his functional ability Baseline: patient reports frequent stops while walking due to pain 07/16/2024: reports continued limitation 09/10/2024: reports continued limitation Goal status: ONGOING   PLAN: PT FREQUENCY: 1-2x/week  PT DURATION: 8 weeks  PLANNED INTERVENTIONS: 97164- PT Re-evaluation, 97750- Physical Performance Testing, 97110-Therapeutic exercises, 97530- Therapeutic activity, 97112- Neuromuscular re-education, 97535- Self Care, 02859- Manual therapy, 20560 (1-2 muscles), 20561 (3+ muscles)- Dry Needling, Patient/Family education, Balance training, Stair training, Taping, Joint mobilization, Joint manipulation, Spinal manipulation, Spinal mobilization, Cryotherapy, and Moist heat.  PLAN FOR NEXT SESSION: Review HEP and progress PRN, continue to work on LE and hip flexibility, progress LE, core, hip strengthening to improve activity tolerance, incorporate balance and stability exercises   Elaine Daring, PT, DPT, LAT, ATC 09/17/24  11:07 AM Phone: [phone] Fax: [phone]

## 2024-09-22 ENCOUNTER — Other Ambulatory Visit: Payer: Self-pay

## 2024-09-22 ENCOUNTER — Encounter: Payer: Self-pay | Admitting: Physical Therapy

## 2024-09-22 ENCOUNTER — Ambulatory Visit: Admitting: Physical Therapy

## 2024-09-22 DIAGNOSIS — M5459 Other low back pain: Secondary | ICD-10-CM

## 2024-09-22 DIAGNOSIS — M6281 Muscle weakness (generalized): Secondary | ICD-10-CM | POA: Diagnosis not present

## 2024-09-22 NOTE — Therapy (Signed)
 OUTPATIENT PHYSICAL THERAPY TREATMENT   Patient Name: [name] MRN: [id number] DOB:04/13/1946, 78 y.o., male Today's Date: 09/22/2024   END OF SESSION:  PT End of Session - 09/22/24 1305     Visit Number 11    Number of Visits 17    Date for Recertification  11/05/24    Authorization Type [employer]    PT Start Time 1300    PT Stop Time 1340    PT Time Calculation (min) 40 min    Activity Tolerance Patient tolerated treatment well    Behavior During Therapy WFL for tasks assessed/performed                    Past Medical History:  Diagnosis Date   Complete heart block (HCC)    S/P  PPM by Dr Kelsie   Coronary artery disease    Non-obstructive. Pt had left heart catheterization in August 2009 showing a 30% mid-circumflex lesion, otherwise coronaries were clean angiographically   Hyperlipidemia    Hypertension    OSA (obstructive sleep apnea)    Probable; study never scheduled   Pacemaker 04/04/2024   Presence of Watchman left atrial appendage closure device 07/31/2024   31 mm Watchman Device, JP   Past Surgical History:  Procedure Laterality Date   BIV UPGRADE N/A 04/04/2024   Procedure: BIV UPGRADE;  Surgeon: Mealor, Eulas BRAVO, MD;  Location: [facility] INVASIVE CV LAB;  Service: Cardiovascular;  Laterality: N/A;   CARDIAC CATHETERIZATION  1993, 2009   2009- negative   COLONOSCOPY  2011   negative, [facility]   LEFT ATRIAL APPENDAGE OCCLUSION N/A 07/31/2024   Procedure: LEFT ATRIAL APPENDAGE OCCLUSION;  Surgeon: Kennyth Chew, MD;  Location: [facility] INVASIVE CV LAB;  Service: Cardiovascular;  Laterality: N/A;   LEFT HEART CATH AND CORONARY ANGIOGRAPHY N/A 03/04/2024   Procedure: LEFT HEART CATH AND CORONARY ANGIOGRAPHY;  Surgeon: Wonda Sharper, MD;  Location: [facility] INVASIVE CV LAB;  Service: Cardiovascular;  Laterality: N/A;   PACEMAKER INSERTION  01/13/11   MDT by MILUS for complete heart block   PPM GENERATOR CHANGEOUT N/A 04/04/2024   Procedure: PPM  GENERATOR CHANGEOUT;  Surgeon: Nancey, Eulas BRAVO, MD;  Location: [facility] INVASIVE CV LAB;  Service: Cardiovascular;  Laterality: N/A;   TRANSESOPHAGEAL ECHOCARDIOGRAM (CATH LAB) N/A 07/31/2024   Procedure: TRANSESOPHAGEAL ECHOCARDIOGRAM;  Surgeon: Kennyth Chew, MD;  Location: [facility] INVASIVE CV LAB;  Service: Cardiovascular;  Laterality: N/A;   Patient Active Problem List   Diagnosis Date Noted   Presence of heart assist device (HCC) 09/04/2024   Presence of Watchman left atrial appendage closure device 07/31/2024   Persistent atrial fibrillation (HCC) 07/31/2024   Right leg swelling 07/28/2024   Pain in right lower leg 05/05/2024   CAD in native artery 02/14/2024   Chronic kidney disease, stage 3a (HCC) 02/10/2024   Cellulitis of left lower extremity 08/30/2023   Elevated LFTs 03/14/2023   Atrial flutter (HCC) 02/13/2023   Chronic left SI joint pain 12/22/2022   DOE (dyspnea on exertion) 12/22/2022   Chest pain, atypical 10/10/2022   Upper airway cough syndrome 10/10/2022   Chronic cough 02/01/2022   Chronic right SI joint pain 05/11/2021   Umbilical hernia without obstruction or gangrene 02/02/2021   Hepatic steatosis 08/05/2020   Aortic atherosclerosis 08/04/2020   Complex regional pain syndrome type I of the upper limb 07/26/2020   Flexion contracture of joint of hand 07/26/2020   Pacemaker 07/26/2020   Rib pain on left side 06/09/2020  Decreased pulses in feet 06/09/2020   Erectile dysfunction 03/03/2017   Chronic headaches 02/13/2017   Snoring 02/15/2015   Dupuytren's contracture of both hands 09/02/2013   Stiffness of right hand joint 05/07/2013   Gout 12/27/2011   Essential hypertension 01/12/2011   Complete heart block (HCC) - PPM 01/12/2011   Prediabetes 03/02/2009   HYPERPLASIA PROSTATE UNS W/O UR OBST & OTH LUTS 08/13/2008   Hypercholesterolemia 02/17/2008   PAROXYSMAL SUPRAVENTRICULAR TACHYCARDIA 02/17/2008    PCP: Geofm Glade PARAS, MD  REFERRING PROVIDER: Joane Artist RAMAN, MD  REFERRING DIAG: Chronic bilateral low back pain without sciatica; Chronic SI joint pain; Chronic left shoulder pain  Rationale for Evaluation and Treatment: Rehabilitation  THERAPY DIAG:  Other low back pain  Muscle weakness (generalized)  ONSET DATE: Chronic   SUBJECTIVE:          SUBJECTIVE STATEMENT: Patient reports he started having a little more pain yesterday, feels sore to the touch.   Eval: Patient reports he became inactive years ago and they found that his SIJ are screwed up and they get better with shots. He has had a fall recently and had a new pacemaker put in. He reports that if he walks extended periods of time then he will be hurting right around the back of the hips. He states he fell while in the hospital and with his new pacemaker he is not allowed to lift more that 10 lbs with the left arm. He does report feeling slightly off balance. He always uses a hand rail when going up and down stairs.   PERTINENT HISTORY:  See PMH above  PAIN:  Are you having pain? Yes:  NPRS scale: 3-4/10 currently Pain location: Bilateral lower back and SIJ region Pain description: Stiff, ache Aggravating factors: Worse in morning, walking or standing extended periods, anything that requires bending Relieving factors: Patches or sprays, medication  PRECAUTIONS: Fall  WEIGHT BEARING RESTRICTIONS: No  FALLS:  Has patient fallen in last 6 months? Yes. Number of falls 1  PATIENT GOALS: Get more flexible and move better.    OBJECTIVE:  Note: Objective measures were completed at Evaluation unless otherwise noted. PATIENT SURVEYS:  Modified Oswestry: 11/50 (22% disability)  07/16/2024: 10/50 (20% disability) 09/10/2024: 8/50 (16% disability)  MUSCLE LENGTH: Limitation with hamstring, quad and hip flexibility  POSTURE:  Rounded shoulder posture, remains in slightly knee flexed/crouched position, decreased lumbar lordosis  PALPATION: Tender to palpation bilateral  lumbar paraspinals, PSIS and SIJ region, piriformis region  Lumbar CPA hypomobile throughout  LUMBAR ROM:   AROM eval  Flexion Limited and tight  Extension Limited and painful  Right lateral flexion   Left lateral flexion   Right rotation Limited and tight  Left rotation Limited and tight   (Blank rows = not tested)  LOWER EXTREMITY ROM:    Hip PROM grossly limited in all directions with tightness reported at end ranges  LOWER EXTREMITY MMT:    MMT Right eval Left eval Rt / Lt 07/16/2024  Hip flexion 4- 4-   Hip extension 3- 3- 3- / 3-  Hip abduction 3 3 3  / 3  Hip adduction     Hip internal rotation     Hip external rotation     Knee flexion 4 4 4  / 4  Knee extension 4 4 4  / 4  Ankle dorsiflexion     Ankle plantarflexion     Ankle inversion     Ankle eversion      (Blank  rows = not tested)  FUNCTIONAL TESTS:  5 times sit to stand: 15 seconds  07/16/2024: 15 seconds  09/10/2024: 14 seconds  Romberg stance: patient demonstrates greater difficulty with eyes closed   GAIT: Assistive device utilized: None Level of assistance: Complete Independence Comments: Remains in relative knee flexed/crouched, toe out, unsteady gait   TREATMENT [facility] Adult PT Treatment:                                                DATE: 09/22/2024 Recumbent bike L3 x 10 min to improve endurance and workload capacity IASTM for bilateral lumbar paraspinals using theragun with patient in seated and forward lean position Standing hip flexor stretch / pelvic rotation with front foot on 12 box 10 x 5 sec each Seated lumbar stretch with stability ball fwd x 10 Seated thoracic extension over back of chair x 10 Seated hamstring stretch 3 x 20 sec Seated clamshell with blue 2 x 10 Sit to stand from elevated table 2 x 10  PATIENT EDUCATION:  Education details: HEP Person educated: Patient Education method: Programmer, Multimedia, Demonstration, Actor cues, Verbal cues Education comprehension: verbalized  understanding, returned demonstration, verbal cues required, tactile cues required, and needs further education  HOME EXERCISE PROGRAM: Access Code: [email or web address]    ASSESSMENT: CLINICAL IMPRESSION: Patient tolerated therapy well with no adverse effects. He did arrive reporting increased pain this visit and more pain located to the left SIJ region. Therapy continues to focus on improving his mobility and progressing strength. Worked on exercises to improve his ability to stand in better posture to avoid a bent over or trunk flexed position. He did report improvement in symptoms following therapy. No changes to is HEP this visit. Patient would benefit from continued skilled PT to progress mobility and strength in order to reduce pain and maximize functional ability.   Eval: Patient is a 78 y.o. male who was seen today for physical therapy evaluation and treatment for chronic lower back and pelvic pain, and recent fall.   OBJECTIVE IMPAIRMENTS: Abnormal gait, decreased activity tolerance, decreased balance, decreased ROM, decreased strength, impaired flexibility, postural dysfunction, and pain.   ACTIVITY LIMITATIONS: carrying, lifting, bending, standing, stairs, and locomotion level  PARTICIPATION LIMITATIONS: meal prep, cleaning, shopping, and community activity  PERSONAL FACTORS: Fitness, Past/current experiences, and Time since onset of injury/illness/exacerbation are also affecting patient's functional outcome.    GOALS: Goals reviewed with patient? Yes  SHORT TERM GOALS: Target date: 06/04/2024  Patient will be I with initial HEP in order to progress with therapy. Baseline: HEP provided at eval 06/11/2024: independent Goal status: MET  LONG TERM GOALS: Target date: 11/05/2024  Patient will be I with final HEP to maintain progress from PT. Baseline: HEP provided at eval 07/16/2024: progressing 09/10/2024: progressing Goal status: ONGOING  2.  Patient will report Modified Oswestry  </= 5/50 (10% disability) in order to indicate an improvement in their functional status Baseline: 11/50 (22%) 07/16/2024: 10/50 (20% disability) 09/10/2024: 8/50 (16% disability) Goal status: ONGOING  3.  Patient will demonstrate 5xSTS </= 12 seconds to indicate improvement in strength and reduce fall risk Baseline: 15 seconds 07/16/2024: 15 seconds 09/10/2024: 14 seconds Goal status: ONGOING  4.  Patient will be able to walk for extended periods without need to stop due to pain in order to improve his functional ability Baseline: patient reports frequent stops while  walking due to pain 07/16/2024: reports continued limitation 09/10/2024: reports continued limitation Goal status: ONGOING   PLAN: PT FREQUENCY: 1-2x/week  PT DURATION: 8 weeks  PLANNED INTERVENTIONS: 97164- PT Re-evaluation, 97750- Physical Performance Testing, 97110-Therapeutic exercises, 97530- Therapeutic activity, 97112- Neuromuscular re-education, 97535- Self Care, 02859- Manual therapy, 20560 (1-2 muscles), 20561 (3+ muscles)- Dry Needling, Patient/Family education, Balance training, Stair training, Taping, Joint mobilization, Joint manipulation, Spinal manipulation, Spinal mobilization, Cryotherapy, and Moist heat.  PLAN FOR NEXT SESSION: Review HEP and progress PRN, continue to work on LE and hip flexibility, progress LE, core, hip strengthening to improve activity tolerance, incorporate balance and stability exercises   Elaine Daring, PT, DPT, LAT, ATC 09/22/24  1:51 PM Phone: [phone] Fax: [phone]

## 2024-09-24 ENCOUNTER — Ambulatory Visit: Payer: PPO

## 2024-09-25 ENCOUNTER — Ambulatory Visit: Admitting: Physical Therapy

## 2024-09-25 ENCOUNTER — Other Ambulatory Visit: Payer: Self-pay

## 2024-09-25 ENCOUNTER — Encounter: Payer: Self-pay | Admitting: Physical Therapy

## 2024-09-25 DIAGNOSIS — M6281 Muscle weakness (generalized): Secondary | ICD-10-CM | POA: Diagnosis not present

## 2024-09-25 DIAGNOSIS — M5459 Other low back pain: Secondary | ICD-10-CM

## 2024-09-25 NOTE — Patient Instructions (Signed)
 Access Code: [email or web address] URL: [email or web address] Date: 09/25/2024 Prepared by: Elaine Daring  Exercises - Seated Hamstring Stretch  - 1 x daily - 3 reps - 15 seconds hold - Standing Hip Extension with Counter Support  - 1 x daily - 3 sets - 10 reps - Standing Hip Abduction with Counter Support  - 1 x daily - 3 sets - 10 reps - Standing Row with Anchored Resistance  - 1 x daily - 3 sets - 10 reps - Gastroc Stretch on Wall  - 1 x daily - 10 reps - 5 seconds hold - Hip Flexor Stretch on Step  - 1 x daily - 10 reps

## 2024-09-25 NOTE — Therapy (Signed)
 OUTPATIENT PHYSICAL THERAPY TREATMENT   Patient Name: [name] MRN: [id number] DOB:1946-01-14, 78 y.o., male Today's Date: 09/25/2024   END OF SESSION:  PT End of Session - 09/25/24 1349     Visit Number 12    Number of Visits 17    Date for Recertification  11/05/24    Authorization Type [employer]    PT Start Time 1345    PT Stop Time 1430    PT Time Calculation (min) 45 min    Activity Tolerance Patient tolerated treatment well    Behavior During Therapy WFL for tasks assessed/performed                     Past Medical History:  Diagnosis Date   Complete heart block (HCC)    S/P  PPM by Dr Kelsie   Coronary artery disease    Non-obstructive. Pt had left heart catheterization in August 2009 showing a 30% mid-circumflex lesion, otherwise coronaries were clean angiographically   Hyperlipidemia    Hypertension    OSA (obstructive sleep apnea)    Probable; study never scheduled   Pacemaker 04/04/2024   Presence of Watchman left atrial appendage closure device 07/31/2024   31 mm Watchman Device, JP   Past Surgical History:  Procedure Laterality Date   BIV UPGRADE N/A 04/04/2024   Procedure: BIV UPGRADE;  Surgeon: Mealor, Eulas BRAVO, MD;  Location: [facility] INVASIVE CV LAB;  Service: Cardiovascular;  Laterality: N/A;   CARDIAC CATHETERIZATION  1993, 2009   2009- negative   COLONOSCOPY  2011   negative, [facility]   LEFT ATRIAL APPENDAGE OCCLUSION N/A 07/31/2024   Procedure: LEFT ATRIAL APPENDAGE OCCLUSION;  Surgeon: Kennyth Chew, MD;  Location: [facility] INVASIVE CV LAB;  Service: Cardiovascular;  Laterality: N/A;   LEFT HEART CATH AND CORONARY ANGIOGRAPHY N/A 03/04/2024   Procedure: LEFT HEART CATH AND CORONARY ANGIOGRAPHY;  Surgeon: Wonda Sharper, MD;  Location: [facility] INVASIVE CV LAB;  Service: Cardiovascular;  Laterality: N/A;   PACEMAKER INSERTION  01/13/11   MDT by MILUS for complete heart block   PPM GENERATOR CHANGEOUT N/A 04/04/2024   Procedure: PPM  GENERATOR CHANGEOUT;  Surgeon: Nancey, Eulas BRAVO, MD;  Location: [facility] INVASIVE CV LAB;  Service: Cardiovascular;  Laterality: N/A;   TRANSESOPHAGEAL ECHOCARDIOGRAM (CATH LAB) N/A 07/31/2024   Procedure: TRANSESOPHAGEAL ECHOCARDIOGRAM;  Surgeon: Kennyth Chew, MD;  Location: [facility] INVASIVE CV LAB;  Service: Cardiovascular;  Laterality: N/A;   Patient Active Problem List   Diagnosis Date Noted   Presence of heart assist device (HCC) 09/04/2024   Presence of Watchman left atrial appendage closure device 07/31/2024   Persistent atrial fibrillation (HCC) 07/31/2024   Right leg swelling 07/28/2024   Pain in right lower leg 05/05/2024   CAD in native artery 02/14/2024   Chronic kidney disease, stage 3a (HCC) 02/10/2024   Cellulitis of left lower extremity 08/30/2023   Elevated LFTs 03/14/2023   Atrial flutter (HCC) 02/13/2023   Chronic left SI joint pain 12/22/2022   DOE (dyspnea on exertion) 12/22/2022   Chest pain, atypical 10/10/2022   Upper airway cough syndrome 10/10/2022   Chronic cough 02/01/2022   Chronic right SI joint pain 05/11/2021   Umbilical hernia without obstruction or gangrene 02/02/2021   Hepatic steatosis 08/05/2020   Aortic atherosclerosis 08/04/2020   Complex regional pain syndrome type I of the upper limb 07/26/2020   Flexion contracture of joint of hand 07/26/2020   Pacemaker 07/26/2020   Rib pain on left side 06/09/2020  Decreased pulses in feet 06/09/2020   Erectile dysfunction 03/03/2017   Chronic headaches 02/13/2017   Snoring 02/15/2015   Dupuytren's contracture of both hands 09/02/2013   Stiffness of right hand joint 05/07/2013   Gout 12/27/2011   Essential hypertension 01/12/2011   Complete heart block (HCC) - PPM 01/12/2011   Prediabetes 03/02/2009   HYPERPLASIA PROSTATE UNS W/O UR OBST & OTH LUTS 08/13/2008   Hypercholesterolemia 02/17/2008   PAROXYSMAL SUPRAVENTRICULAR TACHYCARDIA 02/17/2008    PCP: Geofm Glade PARAS, MD  REFERRING PROVIDER: Joane Artist RAMAN, MD  REFERRING DIAG: Chronic bilateral low back pain without sciatica; Chronic SI joint pain; Chronic left shoulder pain  Rationale for Evaluation and Treatment: Rehabilitation  THERAPY DIAG:  Other low back pain  Muscle weakness (generalized)  ONSET DATE: Chronic   SUBJECTIVE:          SUBJECTIVE STATEMENT: Patient reports he is feeling better this visit. He reports improved ability picking small objects up off the floor.  Eval: Patient reports he became inactive years ago and they found that his SIJ are screwed up and they get better with shots. He has had a fall recently and had a new pacemaker put in. He reports that if he walks extended periods of time then he will be hurting right around the back of the hips. He states he fell while in the hospital and with his new pacemaker he is not allowed to lift more that 10 lbs with the left arm. He does report feeling slightly off balance. He always uses a hand rail when going up and down stairs.   PERTINENT HISTORY:  See PMH above  PAIN:  Are you having pain? Yes:  NPRS scale: 3-4/10 currently Pain location: Bilateral lower back and SIJ region Pain description: Stiff, ache Aggravating factors: Worse in morning, walking or standing extended periods, anything that requires bending Relieving factors: Patches or sprays, medication  PRECAUTIONS: Fall  WEIGHT BEARING RESTRICTIONS: No  FALLS:  Has patient fallen in last 6 months? Yes. Number of falls 1  PATIENT GOALS: Get more flexible and move better.    OBJECTIVE:  Note: Objective measures were completed at Evaluation unless otherwise noted. PATIENT SURVEYS:  Modified Oswestry: 11/50 (22% disability)  07/16/2024: 10/50 (20% disability) 09/10/2024: 8/50 (16% disability)  MUSCLE LENGTH: Limitation with hamstring, quad and hip flexibility  POSTURE:  Rounded shoulder posture, remains in slightly knee flexed/crouched position, decreased lumbar  lordosis  PALPATION: Tender to palpation bilateral lumbar paraspinals, PSIS and SIJ region, piriformis region  Lumbar CPA hypomobile throughout  LUMBAR ROM:   AROM eval  Flexion Limited and tight  Extension Limited and painful  Right lateral flexion   Left lateral flexion   Right rotation Limited and tight  Left rotation Limited and tight   (Blank rows = not tested)  LOWER EXTREMITY ROM:    Hip PROM grossly limited in all directions with tightness reported at end ranges  LOWER EXTREMITY MMT:    MMT Right eval Left eval Rt / Lt 07/16/2024  Hip flexion 4- 4-   Hip extension 3- 3- 3- / 3-  Hip abduction 3 3 3  / 3  Hip adduction     Hip internal rotation     Hip external rotation     Knee flexion 4 4 4  / 4  Knee extension 4 4 4  / 4  Ankle dorsiflexion     Ankle plantarflexion     Ankle inversion     Ankle eversion      (  Blank rows = not tested)  FUNCTIONAL TESTS:  5 times sit to stand: 15 seconds  07/16/2024: 15 seconds  09/10/2024: 14 seconds  Romberg stance: patient demonstrates greater difficulty with eyes closed   GAIT: Assistive device utilized: None Level of assistance: Complete Independence Comments: Remains in relative knee flexed/crouched, toe out, unsteady gait   TREATMENT [facility] Adult PT Treatment:                                                DATE: 09/25/2024 Recumbent bike L4 x 10 min to improve endurance and workload capacity IASTM for bilateral lumbar paraspinals using theragun with patient in seated and forward lean position Seated thoracic extension with hands behind head x 10 Seated hamstring stretch 3 x 20 sec Standing hip flexor stretch / pelvic rotation with front foot on 12 box 10 x 5 sec each Deadlift from 14 box with 15# 3 x 10 Seated lumbar stretch with stability ball fwd and diagonal x 5 each Sit to stand from elevated table 2 x 10  PATIENT EDUCATION:  Education details: HEP update Person educated: Patient Education method:  Explanation, Demonstration, Tactile cues, Verbal cues, handouts Education comprehension: verbalized understanding, returned demonstration, verbal cues required, tactile cues required, and needs further education  HOME EXERCISE PROGRAM: Access Code: [email or web address]    ASSESSMENT: CLINICAL IMPRESSION: Patient tolerated therapy well with no adverse effects. Therapy continues to focus on improving his mobility and progressing strength. Incorporated lifting this visit from a higher surface with good tolerance. He does require cueing for proper lifting mechanics. Updated his HEP to progress hip and pelvic stretching for home. Patient would benefit from continued skilled PT to progress mobility and strength in order to reduce pain and maximize functional ability.   Eval: Patient is a 78 y.o. male who was seen today for physical therapy evaluation and treatment for chronic lower back and pelvic pain, and recent fall.   OBJECTIVE IMPAIRMENTS: Abnormal gait, decreased activity tolerance, decreased balance, decreased ROM, decreased strength, impaired flexibility, postural dysfunction, and pain.   ACTIVITY LIMITATIONS: carrying, lifting, bending, standing, stairs, and locomotion level  PARTICIPATION LIMITATIONS: meal prep, cleaning, shopping, and community activity  PERSONAL FACTORS: Fitness, Past/current experiences, and Time since onset of injury/illness/exacerbation are also affecting patient's functional outcome.    GOALS: Goals reviewed with patient? Yes  SHORT TERM GOALS: Target date: 06/04/2024  Patient will be I with initial HEP in order to progress with therapy. Baseline: HEP provided at eval 06/11/2024: independent Goal status: MET  LONG TERM GOALS: Target date: 11/05/2024  Patient will be I with final HEP to maintain progress from PT. Baseline: HEP provided at eval 07/16/2024: progressing 09/10/2024: progressing Goal status: ONGOING  2.  Patient will report Modified Oswestry </= 5/50  (10% disability) in order to indicate an improvement in their functional status Baseline: 11/50 (22%) 07/16/2024: 10/50 (20% disability) 09/10/2024: 8/50 (16% disability) Goal status: ONGOING  3.  Patient will demonstrate 5xSTS </= 12 seconds to indicate improvement in strength and reduce fall risk Baseline: 15 seconds 07/16/2024: 15 seconds 09/10/2024: 14 seconds Goal status: ONGOING  4.  Patient will be able to walk for extended periods without need to stop due to pain in order to improve his functional ability Baseline: patient reports frequent stops while walking due to pain 07/16/2024: reports continued limitation 09/10/2024: reports continued limitation Goal status: ONGOING  PLAN: PT FREQUENCY: 1-2x/week  PT DURATION: 8 weeks  PLANNED INTERVENTIONS: 97164- PT Re-evaluation, 97750- Physical Performance Testing, 97110-Therapeutic exercises, 97530- Therapeutic activity, 97112- Neuromuscular re-education, 97535- Self Care, 02859- Manual therapy, 20560 (1-2 muscles), 20561 (3+ muscles)- Dry Needling, Patient/Family education, Balance training, Stair training, Taping, Joint mobilization, Joint manipulation, Spinal manipulation, Spinal mobilization, Cryotherapy, and Moist heat.  PLAN FOR NEXT SESSION: Review HEP and progress PRN, continue to work on LE and hip flexibility, progress LE, core, hip strengthening to improve activity tolerance, incorporate balance and stability exercises   Elaine Daring, PT, DPT, LAT, ATC 09/25/24  3:01 PM Phone: [phone] Fax: [phone]

## 2024-09-29 ENCOUNTER — Encounter: Payer: Self-pay | Admitting: Physical Therapy

## 2024-09-29 ENCOUNTER — Other Ambulatory Visit: Payer: Self-pay

## 2024-09-29 ENCOUNTER — Ambulatory Visit: Admitting: Physical Therapy

## 2024-09-29 DIAGNOSIS — M6281 Muscle weakness (generalized): Secondary | ICD-10-CM | POA: Diagnosis not present

## 2024-09-29 DIAGNOSIS — M5459 Other low back pain: Secondary | ICD-10-CM | POA: Diagnosis not present

## 2024-09-29 NOTE — Therapy (Signed)
 OUTPATIENT PHYSICAL THERAPY TREATMENT   Patient Name: [name] MRN: [id number] DOB:1946/07/14, 78 y.o., male Today's Date: 09/29/2024   END OF SESSION:  PT End of Session - 09/29/24 1307     Visit Number 13    Number of Visits 17    Date for Recertification  11/05/24    Authorization Type [employer]    PT Start Time 1300    PT Stop Time 1345    PT Time Calculation (min) 45 min    Activity Tolerance Patient tolerated treatment well    Behavior During Therapy WFL for tasks assessed/performed                      Past Medical History:  Diagnosis Date   Complete heart block (HCC)    S/P  PPM by Dr Kelsie   Coronary artery disease    Non-obstructive. Pt had left heart catheterization in August 2009 showing a 30% mid-circumflex lesion, otherwise coronaries were clean angiographically   Hyperlipidemia    Hypertension    OSA (obstructive sleep apnea)    Probable; study never scheduled   Pacemaker 04/04/2024   Presence of Watchman left atrial appendage closure device 07/31/2024   31 mm Watchman Device, JP   Past Surgical History:  Procedure Laterality Date   BIV UPGRADE N/A 04/04/2024   Procedure: BIV UPGRADE;  Surgeon: Mealor, Eulas BRAVO, MD;  Location: [facility] INVASIVE CV LAB;  Service: Cardiovascular;  Laterality: N/A;   CARDIAC CATHETERIZATION  1993, 2009   2009- negative   COLONOSCOPY  2011   negative, [facility]   LEFT ATRIAL APPENDAGE OCCLUSION N/A 07/31/2024   Procedure: LEFT ATRIAL APPENDAGE OCCLUSION;  Surgeon: Kennyth Chew, MD;  Location: [facility] INVASIVE CV LAB;  Service: Cardiovascular;  Laterality: N/A;   LEFT HEART CATH AND CORONARY ANGIOGRAPHY N/A 03/04/2024   Procedure: LEFT HEART CATH AND CORONARY ANGIOGRAPHY;  Surgeon: Wonda Sharper, MD;  Location: [facility] INVASIVE CV LAB;  Service: Cardiovascular;  Laterality: N/A;   PACEMAKER INSERTION  01/13/11   MDT by MILUS for complete heart block   PPM GENERATOR CHANGEOUT N/A 04/04/2024   Procedure: PPM  GENERATOR CHANGEOUT;  Surgeon: Nancey, Eulas BRAVO, MD;  Location: [facility] INVASIVE CV LAB;  Service: Cardiovascular;  Laterality: N/A;   TRANSESOPHAGEAL ECHOCARDIOGRAM (CATH LAB) N/A 07/31/2024   Procedure: TRANSESOPHAGEAL ECHOCARDIOGRAM;  Surgeon: Kennyth Chew, MD;  Location: [facility] INVASIVE CV LAB;  Service: Cardiovascular;  Laterality: N/A;   Patient Active Problem List   Diagnosis Date Noted   Presence of heart assist device (HCC) 09/04/2024   Presence of Watchman left atrial appendage closure device 07/31/2024   Persistent atrial fibrillation (HCC) 07/31/2024   Right leg swelling 07/28/2024   Pain in right lower leg 05/05/2024   CAD in native artery 02/14/2024   Chronic kidney disease, stage 3a (HCC) 02/10/2024   Cellulitis of left lower extremity 08/30/2023   Elevated LFTs 03/14/2023   Atrial flutter (HCC) 02/13/2023   Chronic left SI joint pain 12/22/2022   DOE (dyspnea on exertion) 12/22/2022   Chest pain, atypical 10/10/2022   Upper airway cough syndrome 10/10/2022   Chronic cough 02/01/2022   Chronic right SI joint pain 05/11/2021   Umbilical hernia without obstruction or gangrene 02/02/2021   Hepatic steatosis 08/05/2020   Aortic atherosclerosis 08/04/2020   Complex regional pain syndrome type I of the upper limb 07/26/2020   Flexion contracture of joint of hand 07/26/2020   Pacemaker 07/26/2020   Rib pain on left side  06/09/2020   Decreased pulses in feet 06/09/2020   Erectile dysfunction 03/03/2017   Chronic headaches 02/13/2017   Snoring 02/15/2015   Dupuytren's contracture of both hands 09/02/2013   Stiffness of right hand joint 05/07/2013   Gout 12/27/2011   Essential hypertension 01/12/2011   Complete heart block (HCC) - PPM 01/12/2011   Prediabetes 03/02/2009   HYPERPLASIA PROSTATE UNS W/O UR OBST & OTH LUTS 08/13/2008   Hypercholesterolemia 02/17/2008   PAROXYSMAL SUPRAVENTRICULAR TACHYCARDIA 02/17/2008    PCP: Geofm Glade PARAS, MD  REFERRING PROVIDER: Joane Artist RAMAN, MD  REFERRING DIAG: Chronic bilateral low back pain without sciatica; Chronic SI joint pain; Chronic left shoulder pain  Rationale for Evaluation and Treatment: Rehabilitation  THERAPY DIAG:  Other low back pain  Muscle weakness (generalized)  ONSET DATE: Chronic   SUBJECTIVE:          SUBJECTIVE STATEMENT: Patient reports he is feeling better this visit. He reports improved ability picking small objects up off the floor.  Eval: Patient reports he became inactive years ago and they found that his SIJ are screwed up and they get better with shots. He has had a fall recently and had a new pacemaker put in. He reports that if he walks extended periods of time then he will be hurting right around the back of the hips. He states he fell while in the hospital and with his new pacemaker he is not allowed to lift more that 10 lbs with the left arm. He does report feeling slightly off balance. He always uses a hand rail when going up and down stairs.   PERTINENT HISTORY:  See PMH above  PAIN:  Are you having pain? Yes:  NPRS scale: 3-4/10 currently Pain location: Bilateral lower back and SIJ region Pain description: Stiff, ache Aggravating factors: Worse in morning, walking or standing extended periods, anything that requires bending Relieving factors: Patches or sprays, medication  PRECAUTIONS: Fall  WEIGHT BEARING RESTRICTIONS: No  FALLS:  Has patient fallen in last 6 months? Yes. Number of falls 1  PATIENT GOALS: Get more flexible and move better.    OBJECTIVE:  Note: Objective measures were completed at Evaluation unless otherwise noted. PATIENT SURVEYS:  Modified Oswestry: 11/50 (22% disability)  07/16/2024: 10/50 (20% disability) 09/10/2024: 8/50 (16% disability)  MUSCLE LENGTH: Limitation with hamstring, quad and hip flexibility  POSTURE:  Rounded shoulder posture, remains in slightly knee flexed/crouched position, decreased lumbar  lordosis  PALPATION: Tender to palpation bilateral lumbar paraspinals, PSIS and SIJ region, piriformis region  Lumbar CPA hypomobile throughout  LUMBAR ROM:   AROM eval  Flexion Limited and tight  Extension Limited and painful  Right lateral flexion   Left lateral flexion   Right rotation Limited and tight  Left rotation Limited and tight   (Blank rows = not tested)  LOWER EXTREMITY ROM:    Hip PROM grossly limited in all directions with tightness reported at end ranges  LOWER EXTREMITY MMT:    MMT Right eval Left eval Rt / Lt 07/16/2024  Hip flexion 4- 4-   Hip extension 3- 3- 3- / 3-  Hip abduction 3 3 3  / 3  Hip adduction     Hip internal rotation     Hip external rotation     Knee flexion 4 4 4  / 4  Knee extension 4 4 4  / 4  Ankle dorsiflexion     Ankle plantarflexion     Ankle inversion     Ankle eversion      (  Blank rows = not tested)  FUNCTIONAL TESTS:  5 times sit to stand: 15 seconds  07/16/2024: 15 seconds  09/10/2024: 14 seconds  Romberg stance: patient demonstrates greater difficulty with eyes closed   GAIT: Assistive device utilized: None Level of assistance: Complete Independence Comments: Remains in relative knee flexed/crouched, toe out, unsteady gait   TREATMENT [facility] Adult PT Treatment:                                                DATE: 09/29/2024 Recumbent bike L4 x 10 min to improve endurance and workload capacity IASTM for bilateral lumbar paraspinals using theragun with patient in seated and forward lean position Seated hamstring stretch 3 x 20 sec Seated clamshell with blue 2 x 15 Standing hip flexor stretch / pelvic rotation with front foot on 12 box 10 x 5 sec each Standing hip abduction and extension with yellow at knees 2 x 10 each Deadlift from 14 box with 20# 3 x 10  PATIENT EDUCATION:  Education details: HEP Person educated: Patient Education method: Programmer, Multimedia, Demonstration, Actor cues, Verbal cues Education  comprehension: verbalized understanding, returned demonstration, verbal cues required, tactile cues required, and needs further education  HOME EXERCISE PROGRAM: Access Code: [email or web address]    ASSESSMENT: CLINICAL IMPRESSION: Patient tolerated therapy well with no adverse effects. Therapy continued to focus on improving his flexibility and progressing his strength with good tolerance. He was able to increase weight with lifting this visit but does require cueing for proper lumbopelvic control. No changes made to his HEP this visit. Patient would benefit from continued skilled PT to progress mobility and strength in order to reduce pain and maximize functional ability.   Eval: Patient is a 78 y.o. male who was seen today for physical therapy evaluation and treatment for chronic lower back and pelvic pain, and recent fall.   OBJECTIVE IMPAIRMENTS: Abnormal gait, decreased activity tolerance, decreased balance, decreased ROM, decreased strength, impaired flexibility, postural dysfunction, and pain.   ACTIVITY LIMITATIONS: carrying, lifting, bending, standing, stairs, and locomotion level  PARTICIPATION LIMITATIONS: meal prep, cleaning, shopping, and community activity  PERSONAL FACTORS: Fitness, Past/current experiences, and Time since onset of injury/illness/exacerbation are also affecting patient's functional outcome.    GOALS: Goals reviewed with patient? Yes  SHORT TERM GOALS: Target date: 06/04/2024  Patient will be I with initial HEP in order to progress with therapy. Baseline: HEP provided at eval 06/11/2024: independent Goal status: MET  LONG TERM GOALS: Target date: 11/05/2024  Patient will be I with final HEP to maintain progress from PT. Baseline: HEP provided at eval 07/16/2024: progressing 09/10/2024: progressing Goal status: ONGOING  2.  Patient will report Modified Oswestry </= 5/50 (10% disability) in order to indicate an improvement in their functional status Baseline:  11/50 (22%) 07/16/2024: 10/50 (20% disability) 09/10/2024: 8/50 (16% disability) Goal status: ONGOING  3.  Patient will demonstrate 5xSTS </= 12 seconds to indicate improvement in strength and reduce fall risk Baseline: 15 seconds 07/16/2024: 15 seconds 09/10/2024: 14 seconds Goal status: ONGOING  4.  Patient will be able to walk for extended periods without need to stop due to pain in order to improve his functional ability Baseline: patient reports frequent stops while walking due to pain 07/16/2024: reports continued limitation 09/10/2024: reports continued limitation Goal status: ONGOING   PLAN: PT FREQUENCY: 1-2x/week  PT DURATION: 8 weeks  PLANNED INTERVENTIONS: 97164- PT Re-evaluation, 97750- Physical Performance Testing, 97110-Therapeutic exercises, 97530- Therapeutic activity, 97112- Neuromuscular re-education, 97535- Self Care, 02859- Manual therapy, 20560 (1-2 muscles), 20561 (3+ muscles)- Dry Needling, Patient/Family education, Balance training, Stair training, Taping, Joint mobilization, Joint manipulation, Spinal manipulation, Spinal mobilization, Cryotherapy, and Moist heat.  PLAN FOR NEXT SESSION: Review HEP and progress PRN, continue to work on LE and hip flexibility, progress LE, core, hip strengthening to improve activity tolerance, incorporate balance and stability exercises   Elaine Daring, PT, DPT, LAT, ATC 09/29/24  1:56 PM Phone: [phone] Fax: [phone]

## 2024-09-30 ENCOUNTER — Ambulatory Visit (HOSPITAL_COMMUNITY)
Admission: RE | Admit: 2024-09-30 | Discharge: 2024-09-30 | Disposition: A | Discharge status: Home / Self Care | Source: Ambulatory Visit | Attending: Cardiology | Admitting: Cardiology

## 2024-09-30 DIAGNOSIS — Z95818 Presence of other cardiac implants and grafts: Secondary | ICD-10-CM | POA: Diagnosis not present

## 2024-09-30 DIAGNOSIS — I480 Paroxysmal atrial fibrillation: Secondary | ICD-10-CM | POA: Insufficient documentation

## 2024-09-30 MED ORDER — IOHEXOL 350 MG/ML SOLN
95.0000 mL | Freq: Once | INTRAVENOUS | Status: AC | PRN
  Administered 2024-09-30: 95 mL via INTRAVENOUS

## 2024-10-01 ENCOUNTER — Telehealth: Payer: Self-pay

## 2024-10-01 ENCOUNTER — Ambulatory Visit: Payer: Self-pay

## 2024-10-01 MED ORDER — AMOXICILLIN 500 MG PO TABS: 2000.0000 mg | ORAL_TABLET | ORAL | 0 refills | Status: AC | PRN

## 2024-10-01 NOTE — Telephone Encounter (Signed)
 Patient undergoing dental cleaning in January. Requires antibiotics due to [facility] 07/31/2024. Denies allergy to amoxicillin. Confirmed pharmacy.

## 2024-10-04 ENCOUNTER — Other Ambulatory Visit: Payer: Self-pay | Admitting: Adult Health

## 2024-10-06 NOTE — Telephone Encounter (Signed)
 Courtesy refill, pt needs an appt.

## 2024-10-08 ENCOUNTER — Ambulatory Visit

## 2024-10-08 DIAGNOSIS — I4892 Unspecified atrial flutter: Secondary | ICD-10-CM

## 2024-10-08 LAB — CUP PACEART REMOTE DEVICE CHECK
Battery Remaining Longevity: 95 mo
Battery Voltage: 3.06 V
Brady Statistic AP VP Percent: 1.35 %
Brady Statistic AP VS Percent: 0.01 %
Brady Statistic AS VP Percent: 98.47 %
Brady Statistic AS VS Percent: 0.18 %
Brady Statistic RA Percent Paced: 1.36 %
Brady Statistic RV Percent Paced: 99.81 %
Date Time Interrogation Session: 20251126094103
Implantable Lead Connection Status: 753985
Implantable Lead Connection Status: 753985
Implantable Lead Connection Status: 753985
Implantable Lead Implant Date: 20120302
Implantable Lead Implant Date: 20120302
Implantable Lead Implant Date: 20250523
Implantable Lead Location: 753858
Implantable Lead Location: 753859
Implantable Lead Location: 753860
Implantable Lead Model: 4598
Implantable Lead Model: 5076
Implantable Pulse Generator Implant Date: 20250523
Lead Channel Impedance Value: 323 Ohm
Lead Channel Impedance Value: 380 Ohm
Lead Channel Impedance Value: 399 Ohm
Lead Channel Impedance Value: 399 Ohm
Lead Channel Impedance Value: 456 Ohm
Lead Channel Impedance Value: 475 Ohm
Lead Channel Impedance Value: 494 Ohm
Lead Channel Impedance Value: 532 Ohm
Lead Channel Impedance Value: 532 Ohm
Lead Channel Impedance Value: 646 Ohm
Lead Channel Impedance Value: 703 Ohm
Lead Channel Impedance Value: 741 Ohm
Lead Channel Impedance Value: 741 Ohm
Lead Channel Impedance Value: 798 Ohm
Lead Channel Pacing Threshold Amplitude: 0.625 V
Lead Channel Pacing Threshold Amplitude: 0.75 V
Lead Channel Pacing Threshold Amplitude: 1 V
Lead Channel Pacing Threshold Pulse Width: 0.4 ms
Lead Channel Pacing Threshold Pulse Width: 0.4 ms
Lead Channel Pacing Threshold Pulse Width: 1 ms
Lead Channel Sensing Intrinsic Amplitude: 14.5 mV
Lead Channel Sensing Intrinsic Amplitude: 14.5 mV
Lead Channel Sensing Intrinsic Amplitude: 3.125 mV
Lead Channel Sensing Intrinsic Amplitude: 3.125 mV
Lead Channel Setting Pacing Amplitude: 1.5 V
Lead Channel Setting Pacing Amplitude: 2 V
Lead Channel Setting Pacing Amplitude: 2 V
Lead Channel Setting Pacing Pulse Width: 0.4 ms
Lead Channel Setting Pacing Pulse Width: 1 ms
Lead Channel Setting Sensing Sensitivity: 0.9 mV
Zone Setting Status: 755011
Zone Setting Status: 755011

## 2024-10-10 NOTE — Progress Notes (Signed)
 Remote PPM Transmission

## 2024-10-14 ENCOUNTER — Ambulatory Visit: Payer: Self-pay | Admitting: Cardiovascular Disease

## 2024-10-23 ENCOUNTER — Encounter: Admitting: Physical Therapy

## 2024-10-27 ENCOUNTER — Encounter: Admitting: Physical Therapy

## 2024-10-29 ENCOUNTER — Ambulatory Visit: Admitting: Physical Therapy

## 2024-10-29 ENCOUNTER — Encounter: Payer: Self-pay | Admitting: Physical Therapy

## 2024-10-29 ENCOUNTER — Other Ambulatory Visit: Payer: Self-pay

## 2024-10-29 DIAGNOSIS — M5459 Other low back pain: Secondary | ICD-10-CM | POA: Diagnosis not present

## 2024-10-29 DIAGNOSIS — M6281 Muscle weakness (generalized): Secondary | ICD-10-CM

## 2024-10-29 NOTE — Therapy (Signed)
 OUTPATIENT PHYSICAL THERAPY TREATMENT   Patient Name: [name] MRN: [id number] DOB:[birth year], 78 y.o., male Today's Date: 10/29/2024   END OF SESSION:  PT End of Session - 10/29/24 1306     Visit Number 14    Number of Visits 17    Date for Recertification  11/05/24    Authorization Type [employer]    PT Start Time 1300    PT Stop Time 1345    PT Time Calculation (min) 45 min    Activity Tolerance Patient tolerated treatment well    Behavior During Therapy WFL for tasks assessed/performed                       Past Medical History:  Diagnosis Date   Complete heart block (HCC)    S/P  PPM by Dr Kelsie   Coronary artery disease    Non-obstructive. Pt had left heart catheterization in August 2009 showing a 30% mid-circumflex lesion, otherwise coronaries were clean angiographically   Hyperlipidemia    Hypertension    OSA (obstructive sleep apnea)    Probable; study never scheduled   Pacemaker 04/04/2024   Presence of Watchman left atrial appendage closure device 07/31/2024   31 mm Watchman Device, JP   Past Surgical History:  Procedure Laterality Date   BIV UPGRADE N/A 04/04/2024   Procedure: BIV UPGRADE;  Surgeon: Mealor, Eulas BRAVO, MD;  Location: [facility] INVASIVE CV LAB;  Service: Cardiovascular;  Laterality: N/A;   CARDIAC CATHETERIZATION  1993, 2009   2009- negative   COLONOSCOPY  2011   negative, [facility]   LEFT ATRIAL APPENDAGE OCCLUSION N/A 07/31/2024   Procedure: LEFT ATRIAL APPENDAGE OCCLUSION;  Surgeon: Kennyth Chew, MD;  Location: [facility] INVASIVE CV LAB;  Service: Cardiovascular;  Laterality: N/A;   LEFT HEART CATH AND CORONARY ANGIOGRAPHY N/A 03/04/2024   Procedure: LEFT HEART CATH AND CORONARY ANGIOGRAPHY;  Surgeon: Wonda Sharper, MD;  Location: [facility] INVASIVE CV LAB;  Service: Cardiovascular;  Laterality: N/A;   PACEMAKER INSERTION  01/13/11   MDT by MILUS for complete heart block   PPM GENERATOR CHANGEOUT N/A 04/04/2024   Procedure:  PPM GENERATOR CHANGEOUT;  Surgeon: Nancey, Eulas BRAVO, MD;  Location: [facility] INVASIVE CV LAB;  Service: Cardiovascular;  Laterality: N/A;   TRANSESOPHAGEAL ECHOCARDIOGRAM (CATH LAB) N/A 07/31/2024   Procedure: TRANSESOPHAGEAL ECHOCARDIOGRAM;  Surgeon: Kennyth Chew, MD;  Location: [facility] INVASIVE CV LAB;  Service: Cardiovascular;  Laterality: N/A;   Patient Active Problem List   Diagnosis Date Noted   Presence of heart assist device (HCC) 09/04/2024   Presence of Watchman left atrial appendage closure device 07/31/2024   Persistent atrial fibrillation (HCC) 07/31/2024   Right leg swelling 07/28/2024   Pain in right lower leg 05/05/2024   CAD in native artery 02/14/2024   Chronic kidney disease, stage 3a (HCC) 02/10/2024   Cellulitis of left lower extremity 08/30/2023   Elevated LFTs 03/14/2023   Atrial flutter (HCC) 02/13/2023   Chronic left SI joint pain 12/22/2022   DOE (dyspnea on exertion) 12/22/2022   Chest pain, atypical 10/10/2022   Upper airway cough syndrome 10/10/2022   Chronic cough 02/01/2022   Chronic right SI joint pain 05/11/2021   Umbilical hernia without obstruction or gangrene 02/02/2021   Hepatic steatosis 08/05/2020   Aortic atherosclerosis 08/04/2020   Complex regional pain syndrome type I of the upper limb 07/26/2020   Flexion contracture of joint of hand 07/26/2020   Pacemaker 07/26/2020   Rib pain on left  side 06/09/2020   Decreased pulses in feet 06/09/2020   Erectile dysfunction 03/03/2017   Chronic headaches 02/13/2017   Snoring 02/15/2015   Dupuytren's contracture of both hands 09/02/2013   Stiffness of right hand joint 05/07/2013   Gout 12/27/2011   Essential hypertension 01/12/2011   Complete heart block (HCC) - PPM 01/12/2011   Prediabetes 03/02/2009   HYPERPLASIA PROSTATE UNS W/O UR OBST & OTH LUTS 08/13/2008   Hypercholesterolemia 02/17/2008   PAROXYSMAL SUPRAVENTRICULAR TACHYCARDIA 02/17/2008    PCP: Geofm Glade PARAS, MD  REFERRING PROVIDER:  Joane Artist RAMAN, MD  REFERRING DIAG: Chronic bilateral low back pain without sciatica; Chronic SI joint pain; Chronic left shoulder pain  Rationale for Evaluation and Treatment: Rehabilitation  THERAPY DIAG:  Other low back pain  Muscle weakness (generalized)  ONSET DATE: Chronic   SUBJECTIVE:          SUBJECTIVE STATEMENT: Patient reports he hasn't had any sharp pains recently, but he does get worn out and a aching pain in his back if he is doing stuff in the morning or carrying groceries or something like that. He does now have a recliner at the beach so if sleeping better.   Eval: Patient reports he became inactive years ago and they found that his SIJ are screwed up and they get better with shots. He has had a fall recently and had a new pacemaker put in. He reports that if he walks extended periods of time then he will be hurting right around the back of the hips. He states he fell while in the hospital and with his new pacemaker he is not allowed to lift more that 10 lbs with the left arm. He does report feeling slightly off balance. He always uses a hand rail when going up and down stairs.   PERTINENT HISTORY:  See PMH above  PAIN:  Are you having pain? Yes:  NPRS scale: 2/10 currently Pain location: Bilateral lower back and SIJ region Pain description: Stiff, ache Aggravating factors: Worse in morning, walking or standing extended periods, anything that requires bending Relieving factors: Patches or sprays, medication  PRECAUTIONS: Fall  WEIGHT BEARING RESTRICTIONS: No  FALLS:  Has patient fallen in last 6 months? Yes. Number of falls 1  PATIENT GOALS: Get more flexible and move better.    OBJECTIVE:  Note: Objective measures were completed at Evaluation unless otherwise noted. PATIENT SURVEYS:  Modified Oswestry: 11/50 (22% disability)  07/16/2024: 10/50 (20% disability) 09/10/2024: 8/50 (16% disability)  MUSCLE LENGTH: Limitation with hamstring, quad and hip  flexibility  POSTURE:  Rounded shoulder posture, remains in slightly knee flexed/crouched position, decreased lumbar lordosis  PALPATION: Tender to palpation bilateral lumbar paraspinals, PSIS and SIJ region, piriformis region  Lumbar CPA hypomobile throughout  LUMBAR ROM:   AROM eval  Flexion Limited and tight  Extension Limited and painful  Right lateral flexion   Left lateral flexion   Right rotation Limited and tight  Left rotation Limited and tight   (Blank rows = not tested)  LOWER EXTREMITY ROM:    Hip PROM grossly limited in all directions with tightness reported at end ranges  LOWER EXTREMITY MMT:    MMT Right eval Left eval Rt / Lt 07/16/2024  Hip flexion 4- 4-   Hip extension 3- 3- 3- / 3-  Hip abduction 3 3 3  / 3  Hip adduction     Hip internal rotation     Hip external rotation     Knee flexion 4  4 4 / 4  Knee extension 4 4 4  / 4  Ankle dorsiflexion     Ankle plantarflexion     Ankle inversion     Ankle eversion      (Blank rows = not tested)  FUNCTIONAL TESTS:  5 times sit to stand: 15 seconds  07/16/2024: 15 seconds  09/10/2024: 14 seconds  Romberg stance: patient demonstrates greater difficulty with eyes closed   GAIT: Assistive device utilized: None Level of assistance: Complete Independence Comments: Remains in relative knee flexed/crouched, toe out, unsteady gait   TREATMENT [facility] Adult PT Treatment:                                                DATE: 10/29/2024 Recumbent bike L4 x 10 min to improve endurance and workload capacity Seated hamstring stretch 3 x 20 sec Standing hip flexor stretch / pelvic rotation with front foot on 12 box 10 x 5 sec each Slant board calf stretch 3 x 20 cec  Seated stability ball roll out fwd and diagonal x 5 each Deadlift from 12 box with 20# 3 x 10 Bent over row supported at counter lifting 15# from 14 box 3 x 10 each Farmer's carry with 15# unilateral 2 x 90 ft each  PATIENT EDUCATION:  Education  details: HEP Person educated: Patient Education method: Programmer, Multimedia, Demonstration, Actor cues, Verbal cues Education comprehension: verbalized understanding, returned demonstration, verbal cues required, tactile cues required, and needs further education  HOME EXERCISE PROGRAM: Access Code: [email or web address]    ASSESSMENT: CLINICAL IMPRESSION: Patient tolerated therapy well with no adverse effects. Therapy continued to focus on improving his flexibility and progressing his strength with good tolerance. He was able to progress with his strengthening for hip and core control, and postural strengthening with good tolerance. Progressed his deadlift to a lower height with good tolerance, incorporated bent over row for core control and periscapular strengthening, and farmer's carry to improve core control with walking and upright tasks. He denied any increased in back or SIJ pain this visit. No changes made to his HEP this visit. Patient would benefit from continued skilled PT to progress mobility and strength in order to reduce pain and maximize functional ability.   Eval: Patient is a 78 y.o. male who was seen today for physical therapy evaluation and treatment for chronic lower back and pelvic pain, and recent fall.   OBJECTIVE IMPAIRMENTS: Abnormal gait, decreased activity tolerance, decreased balance, decreased ROM, decreased strength, impaired flexibility, postural dysfunction, and pain.   ACTIVITY LIMITATIONS: carrying, lifting, bending, standing, stairs, and locomotion level  PARTICIPATION LIMITATIONS: meal prep, cleaning, shopping, and community activity  PERSONAL FACTORS: Fitness, Past/current experiences, and Time since onset of injury/illness/exacerbation are also affecting patient's functional outcome.    GOALS: Goals reviewed with patient? Yes  SHORT TERM GOALS: Target date: 06/04/2024  Patient will be I with initial HEP in order to progress with therapy. Baseline: HEP provided at  eval 06/11/2024: independent Goal status: MET  LONG TERM GOALS: Target date: 11/05/2024  Patient will be I with final HEP to maintain progress from PT. Baseline: HEP provided at eval 07/16/2024: progressing 09/10/2024: progressing Goal status: ONGOING  2.  Patient will report Modified Oswestry </= 5/50 (10% disability) in order to indicate an improvement in their functional status Baseline: 11/50 (22%) 07/16/2024: 10/50 (20% disability) 09/10/2024: 8/50 (16% disability) Goal  status: ONGOING  3.  Patient will demonstrate 5xSTS </= 12 seconds to indicate improvement in strength and reduce fall risk Baseline: 15 seconds 07/16/2024: 15 seconds 09/10/2024: 14 seconds Goal status: ONGOING  4.  Patient will be able to walk for extended periods without need to stop due to pain in order to improve his functional ability Baseline: patient reports frequent stops while walking due to pain 07/16/2024: reports continued limitation 09/10/2024: reports continued limitation Goal status: ONGOING   PLAN: PT FREQUENCY: 1-2x/week  PT DURATION: 8 weeks  PLANNED INTERVENTIONS: 97164- PT Re-evaluation, 97750- Physical Performance Testing, 97110-Therapeutic exercises, 97530- Therapeutic activity, 97112- Neuromuscular re-education, 97535- Self Care, 02859- Manual therapy, 20560 (1-2 muscles), 20561 (3+ muscles)- Dry Needling, Patient/Family education, Balance training, Stair training, Taping, Joint mobilization, Joint manipulation, Spinal manipulation, Spinal mobilization, Cryotherapy, and Moist heat.  PLAN FOR NEXT SESSION: Review HEP and progress PRN, continue to work on LE and hip flexibility, progress LE, core, hip strengthening to improve activity tolerance, incorporate balance and stability exercises   Elaine Daring, PT, DPT, LAT, ATC 10/29/2024  1:52 PM Phone: [phone] Fax: [phone]

## 2024-10-31 ENCOUNTER — Other Ambulatory Visit: Payer: Self-pay | Admitting: Internal Medicine

## 2024-11-03 ENCOUNTER — Telehealth: Payer: Self-pay

## 2024-11-03 NOTE — Telephone Encounter (Signed)
 The patient reports he thinks he is allergic to Plavix . Since starting the medication about 6 weeks ago, his skin has become progressively itchier and eczema like with little scales that are so itchy he sometimes scratches himself and causes bleeding. He requested a medication change from Plavix . After much discussion, he said he would return back to Eliquis  if he had to but really does not wish to resume due to his thin skin, bruising, and bleeding. He requested a prescription cream called in, but I instructed him to try Aveeno or Cortizone-10 for Eczema OTC and told him if the condition continues to worsen to contact his PCP (he was originally instructed to do this but he is out of town).   Will route to Brandi/Dr. Kennyth for help with a/c management. He is s/p LAAO 07/31/2024.

## 2024-11-04 NOTE — Telephone Encounter (Signed)
 Spoke with patient. Explained he needs to be on DAPT with ASA/Plavix  or return to Eliquis . Patient doesn't want to return to Eliquis . His dermatologist called in cortisone cream which he believe will help. Patient will continue on DAPT with ASA/Plavix . He was grateful for the call.

## 2024-11-05 ENCOUNTER — Other Ambulatory Visit: Payer: Self-pay | Admitting: Internal Medicine

## 2024-11-07 ENCOUNTER — Telehealth: Payer: Self-pay

## 2024-11-07 ENCOUNTER — Encounter: Payer: Self-pay | Admitting: Internal Medicine

## 2024-11-07 NOTE — Telephone Encounter (Addendum)
 Spoke with patient, gave him recommendations discussed with Charlies Arthur, PA-C.   Recommend he see his primary care doctor for his various general ill feeling complaints:  weakness, more fatigue, sleeping more, over past month.  In last few days reports feeling sick, achy, like coming down with something, GI upset, states he thinks he has an infection and is having skin reactions (itchy, dry patchy skin, ? Eczema) and purple spots dermatologist put on cortisone cream.  Wife notices recent changes in breathing pattern now takes short breaths at times like he is sputtering (occurs at rest).     Denies, chest pain, SOB on exertion, dizziness or light headedness.   Patient asks me to send message to his PCP.  I will copy them on this phone message; however, I explained that the appropriate thing for him to do is to call their office so their triage team can talk with him and get him scheduled appropriately as I do not know when or if Dr. Geofm will receive this message.   Patient verbalized understanding, but I am not sure that he will follow through.  We will continue to monitor his device and follow up if we see any further concerns.  Currently nothing to correlate with his current symptoms specifically on our end.

## 2024-11-07 NOTE — Telephone Encounter (Signed)
 Reviewed with Charlies Arthur, PA-C here in clinic.   the flu among other viral illness are certainly going around as well, so hard to know, if he feels sick he may have some viral illness.  He did have AF event for 12 hours on 12/24 that could have made him feel lousy. Appears that has resolved as he is back in sinus rhythm (AS/BVP 95bpm on presenting), if continues to feel sick, recommend he follow up with PCP.  Charlies is not sure what the purple spots are.  If concerned, would have PCP review those as well.  We can lower VT monitoring zone on CRT-P device, not urgent, will add to appointment notes to review at next OV in a few months.   As far as NSVT events, they are short, will continue to monitor and contact patient if we see any concerning arrhythmias.

## 2024-11-07 NOTE — Telephone Encounter (Addendum)
*  CRT--P device.  Uptick this month in short NSVT event burden. (Also VT falling below detection)  8 NSVT events, V-rates up to 214 bpm.  V>A x24 beats on 12/7 at 1007 and x25 beats at 0830.   Noticeable beats below the detection zone. (VT monitor currently set at 150bpm).  12 hour AT/AF event on 12/24, controlled v rates.   Patient says that he has had the following symptoms for past 2-3 weeks:  In last few days- feels like he is coming down with something, weak and achy.  Wife says he has had weird breathing like he can't in hale a full breath. (Patient denies SOB).  Has broken out in severe itching - dermatologist gave a strong topical prescriptions steroid to take. Has weird purple spots on chest.  Has had since Watchman procedure in September. Denies any palpitations, SOB with exertion, dizziness, light headedness.   I recommend patient follow up with PCP, do not see where symptoms above correlate with event's; however, if patient has been under increased stress, illness or other acute issues going on, it can increase likelihood of arrhythmias.     Will review with in office provider as Dr. Nancey is in the hospital today if any other recommendations. Will also ask if VT detection zone should be lowered from 150 to 130-140 given several beats below detection.

## 2024-11-14 NOTE — Telephone Encounter (Signed)
 If he is still not feeling well he should come in for an appt.

## 2024-11-15 ENCOUNTER — Other Ambulatory Visit: Payer: Self-pay | Admitting: Internal Medicine

## 2024-11-16 ENCOUNTER — Other Ambulatory Visit: Payer: Self-pay | Admitting: Internal Medicine

## 2024-11-19 ENCOUNTER — Encounter: Payer: Self-pay | Admitting: Physical Therapy

## 2024-11-19 ENCOUNTER — Other Ambulatory Visit: Payer: Self-pay

## 2024-11-19 ENCOUNTER — Ambulatory Visit: Admitting: Physical Therapy

## 2024-11-19 DIAGNOSIS — M6281 Muscle weakness (generalized): Secondary | ICD-10-CM

## 2024-11-19 DIAGNOSIS — M5459 Other low back pain: Secondary | ICD-10-CM | POA: Diagnosis not present

## 2024-11-19 NOTE — Therapy (Signed)
 " OUTPATIENT PHYSICAL THERAPY TREATMENT   Patient Name: [name] MRN: [id number] DOB:1946/03/27, 79 y.o., male Today's Date: 11/19/2024   END OF SESSION:  PT End of Session - 11/19/24 1310     Visit Number 15    Number of Visits 23    Date for Recertification  01/14/25    Authorization Type [employer]    PT Start Time 1305    PT Stop Time 1345    PT Time Calculation (min) 40 min    Activity Tolerance Patient tolerated treatment well    Behavior During Therapy WFL for tasks assessed/performed                        Past Medical History:  Diagnosis Date   Complete heart block (HCC)    S/P  PPM by Dr Kelsie   Coronary artery disease    Non-obstructive. Pt had left heart catheterization in August 2009 showing a 30% mid-circumflex lesion, otherwise coronaries were clean angiographically   Hyperlipidemia    Hypertension    OSA (obstructive sleep apnea)    Probable; study never scheduled   Pacemaker 04/04/2024   Presence of Watchman left atrial appendage closure device 07/31/2024   31 mm Watchman Device, JP   Past Surgical History:  Procedure Laterality Date   BIV UPGRADE N/A 04/04/2024   Procedure: BIV UPGRADE;  Surgeon: Mealor, Eulas BRAVO, MD;  Location: [facility] INVASIVE CV LAB;  Service: Cardiovascular;  Laterality: N/A;   CARDIAC CATHETERIZATION  1993, 2009   2009- negative   COLONOSCOPY  2011   negative, [facility]   LEFT ATRIAL APPENDAGE OCCLUSION N/A 07/31/2024   Procedure: LEFT ATRIAL APPENDAGE OCCLUSION;  Surgeon: Kennyth Chew, MD;  Location: [facility] INVASIVE CV LAB;  Service: Cardiovascular;  Laterality: N/A;   LEFT HEART CATH AND CORONARY ANGIOGRAPHY N/A 03/04/2024   Procedure: LEFT HEART CATH AND CORONARY ANGIOGRAPHY;  Surgeon: Wonda Sharper, MD;  Location: [facility] INVASIVE CV LAB;  Service: Cardiovascular;  Laterality: N/A;   PACEMAKER INSERTION  01/13/11   MDT by MILUS for complete heart block   PPM GENERATOR CHANGEOUT N/A 04/04/2024   Procedure:  PPM GENERATOR CHANGEOUT;  Surgeon: Nancey, Eulas BRAVO, MD;  Location: [facility] INVASIVE CV LAB;  Service: Cardiovascular;  Laterality: N/A;   TRANSESOPHAGEAL ECHOCARDIOGRAM (CATH LAB) N/A 07/31/2024   Procedure: TRANSESOPHAGEAL ECHOCARDIOGRAM;  Surgeon: Kennyth Chew, MD;  Location: [facility] INVASIVE CV LAB;  Service: Cardiovascular;  Laterality: N/A;   Patient Active Problem List   Diagnosis Date Noted   Presence of heart assist device (HCC) 09/04/2024   Presence of Watchman left atrial appendage closure device 07/31/2024   Persistent atrial fibrillation (HCC) 07/31/2024   Right leg swelling 07/28/2024   Pain in right lower leg 05/05/2024   CAD in native artery 02/14/2024   Chronic kidney disease, stage 3a (HCC) 02/10/2024   Cellulitis of left lower extremity 08/30/2023   Elevated LFTs 03/14/2023   Atrial flutter (HCC) 02/13/2023   Chronic left SI joint pain 12/22/2022   DOE (dyspnea on exertion) 12/22/2022   Chest pain, atypical 10/10/2022   Upper airway cough syndrome 10/10/2022   Chronic cough 02/01/2022   Chronic right SI joint pain 05/11/2021   Umbilical hernia without obstruction or gangrene 02/02/2021   Hepatic steatosis 08/05/2020   Aortic atherosclerosis 08/04/2020   Complex regional pain syndrome type I of the upper limb 07/26/2020   Flexion contracture of joint of hand 07/26/2020   Pacemaker 07/26/2020   Rib pain  on left side 06/09/2020   Decreased pulses in feet 06/09/2020   Erectile dysfunction 03/03/2017   Chronic headaches 02/13/2017   Snoring 02/15/2015   Dupuytren's contracture of both hands 09/02/2013   Stiffness of right hand joint 05/07/2013   Gout 12/27/2011   Essential hypertension 01/12/2011   Complete heart block (HCC) - PPM 01/12/2011   Prediabetes 03/02/2009   HYPERPLASIA PROSTATE UNS W/O UR OBST & OTH LUTS 08/13/2008   Hypercholesterolemia 02/17/2008   PAROXYSMAL SUPRAVENTRICULAR TACHYCARDIA 02/17/2008    PCP: Geofm Glade PARAS, MD  REFERRING PROVIDER:  Joane Artist RAMAN, MD  REFERRING DIAG: Chronic bilateral low back pain without sciatica; Chronic SI joint pain; Chronic left shoulder pain  Rationale for Evaluation and Treatment: Rehabilitation  THERAPY DIAG:  Other low back pain  Muscle weakness (generalized)  ONSET DATE: Chronic   SUBJECTIVE:          SUBJECTIVE STATEMENT: Patient reports he has a crook in his neck today so would like to work on that a little bit. The SIJ has been better, he hasn't had to ice or anything since last visit. He reports that he has been sleeping a lot.   Eval: Patient reports he became inactive years ago and they found that his SIJ are screwed up and they get better with shots. He has had a fall recently and had a new pacemaker put in. He reports that if he walks extended periods of time then he will be hurting right around the back of the hips. He states he fell while in the hospital and with his new pacemaker he is not allowed to lift more that 10 lbs with the left arm. He does report feeling slightly off balance. He always uses a hand rail when going up and down stairs.   PERTINENT HISTORY:  See PMH above  PAIN:  Are you having pain? Yes:  NPRS scale: 2/10 currently Pain location: Bilateral lower back and SIJ region Pain description: Stiff, ache Aggravating factors: Worse in morning, walking or standing extended periods, anything that requires bending Relieving factors: Patches or sprays, medication  PRECAUTIONS: Fall  WEIGHT BEARING RESTRICTIONS: No  FALLS:  Has patient fallen in last 6 months? Yes. Number of falls 1  PATIENT GOALS: Get more flexible and move better.    OBJECTIVE:  Note: Objective measures were completed at Evaluation unless otherwise noted. PATIENT SURVEYS:  Modified Oswestry: 11/50 (22% disability)  07/16/2024: 10/50 (20% disability) 09/10/2024: 8/50 (16% disability) 11/19/2024: 6/50 (12% disability)  MUSCLE LENGTH: Limitation with hamstring, quad and hip  flexibility  POSTURE:  Rounded shoulder posture, remains in slightly knee flexed/crouched position, decreased lumbar lordosis  PALPATION: Tender to palpation bilateral lumbar paraspinals, PSIS and SIJ region, piriformis region  Lumbar CPA hypomobile throughout  LUMBAR ROM:   AROM eval  Flexion Limited and tight  Extension Limited and painful  Right lateral flexion   Left lateral flexion   Right rotation Limited and tight  Left rotation Limited and tight   (Blank rows = not tested)  LOWER EXTREMITY ROM:    Hip PROM grossly limited in all directions with tightness reported at end ranges  LOWER EXTREMITY MMT:    MMT Right eval Left eval Rt / Lt 07/16/2024  Hip flexion 4- 4-   Hip extension 3- 3- 3- / 3-  Hip abduction 3 3 3  / 3  Hip adduction     Hip internal rotation     Hip external rotation     Knee flexion 4  4 4 / 4  Knee extension 4 4 4  / 4  Ankle dorsiflexion     Ankle plantarflexion     Ankle inversion     Ankle eversion      (Blank rows = not tested)  FUNCTIONAL TESTS:  5 times sit to stand: 15 seconds  07/16/2024: 15 seconds  09/10/2024: 14 seconds  11/19/2024: 14 seconds  Romberg stance: patient demonstrates greater difficulty with eyes closed   GAIT: Assistive device utilized: None Level of assistance: Complete Independence Comments: Remains in relative knee flexed/crouched, toe out, unsteady gait   TREATMENT [facility] Adult PT Treatment:                                                DATE: 11/19/2024 Recumbent bike L4 x 10 min to improve endurance and workload capacity IASTM to bilateral upper trap and periscapular region with theragun Doorway pec stretch 3 x 20 sec Step back stretch 5 x 5 sec  Seated cervical rotational SNAG to right 2 x 5 Row with blue 3 x 10 Standing horizontal abduction with yellow 2 x 10 Seated hamstring stretch 3 x 20 sec Seated stability ball roll out fwd and diagonal x 5 each  PATIENT EDUCATION:  Education details: HEP, POC  extension Person educated: Patient Education method: Explanation, Demonstration, Tactile cues, Verbal cues Education comprehension: verbalized understanding, returned demonstration, verbal cues required, tactile cues required, and needs further education  HOME EXERCISE PROGRAM: Access Code: [email or web address]    ASSESSMENT: CLINICAL IMPRESSION: Patient tolerated therapy well with no adverse effects. Therapy focused more on improving his cervical rotational mobility and reduce muscular tension as patient arrived reporting crook in his neck. He did report improvement in symptoms and better neck turning following therapy. He does report an improvement in his functional status on modified ODI this visit, and notes overall improvement in pain since last visit. He does continue to have deficits with his functional mobility indicated on sit to stand and with walking tolerance. No changes made to his HEP this visit. Patient would benefit from continued skilled PT to progress mobility and strength in order to reduce pain and maximize functional ability.   Eval: Patient is a 79 y.o. male who was seen today for physical therapy evaluation and treatment for chronic lower back and pelvic pain, and recent fall.   OBJECTIVE IMPAIRMENTS: Abnormal gait, decreased activity tolerance, decreased balance, decreased ROM, decreased strength, impaired flexibility, postural dysfunction, and pain.   ACTIVITY LIMITATIONS: carrying, lifting, bending, standing, stairs, and locomotion level  PARTICIPATION LIMITATIONS: meal prep, cleaning, shopping, and community activity  PERSONAL FACTORS: Fitness, Past/current experiences, and Time since onset of injury/illness/exacerbation are also affecting patient's functional outcome.    GOALS: Goals reviewed with patient? Yes  SHORT TERM GOALS: Target date: 06/04/2024  Patient will be I with initial HEP in order to progress with therapy. Baseline: HEP provided at eval 06/11/2024:  independent Goal status: MET  LONG TERM GOALS: Target date: 01/14/2025  Patient will be I with final HEP to maintain progress from PT. Baseline: HEP provided at eval 07/16/2024: progressing 09/10/2024: progressing 11/19/2024: progressing Goal status: ONGOING  2.  Patient will report Modified Oswestry </= 5/50 (10% disability) in order to indicate an improvement in their functional status Baseline: 11/50 (22%) 07/16/2024: 10/50 (20% disability) 09/10/2024: 8/50 (16% disability) 11/19/2024: 6/50 (12% disability) Goal status: ONGOING  3.  Patient will demonstrate 5xSTS </= 12 seconds to indicate improvement in strength and reduce fall risk Baseline: 15 seconds 07/16/2024: 15 seconds 09/10/2024: 14 seconds 11/19/2024: 14 seconds Goal status: ONGOING  4.  Patient will be able to walk for extended periods without need to stop due to pain in order to improve his functional ability Baseline: patient reports frequent stops while walking due to pain 07/16/2024: reports continued limitation 09/10/2024: reports continued limitation 11/19/2024: reports continued limitation Goal status: ONGOING   PLAN: PT FREQUENCY: 1-2x/week  PT DURATION: 8 weeks  PLANNED INTERVENTIONS: 97164- PT Re-evaluation, 97750- Physical Performance Testing, 97110-Therapeutic exercises, 97530- Therapeutic activity, 97112- Neuromuscular re-education, 97535- Self Care, 02859- Manual therapy, 20560 (1-2 muscles), 20561 (3+ muscles)- Dry Needling, Patient/Family education, Balance training, Stair training, Taping, Joint mobilization, Joint manipulation, Spinal manipulation, Spinal mobilization, Cryotherapy, and Moist heat.  PLAN FOR NEXT SESSION: Review HEP and progress PRN, continue to work on LE and hip flexibility, progress LE, core, hip strengthening to improve activity tolerance, incorporate balance and stability exercises   Elaine Daring, PT, DPT, LAT, ATC 11/19/2024  1:54 PM Phone: [phone] Fax: [phone]   "

## 2024-11-21 ENCOUNTER — Other Ambulatory Visit: Payer: Self-pay

## 2024-11-21 ENCOUNTER — Ambulatory Visit: Admitting: Physical Therapy

## 2024-11-21 ENCOUNTER — Encounter: Payer: Self-pay | Admitting: Internal Medicine

## 2024-11-21 ENCOUNTER — Ambulatory Visit: Admitting: Internal Medicine

## 2024-11-21 ENCOUNTER — Encounter: Payer: Self-pay | Admitting: Physical Therapy

## 2024-11-21 VITALS — BP 128/80 | HR 65 | Temp 98.1°F | Ht 77.0 in | Wt 232.0 lb

## 2024-11-21 DIAGNOSIS — R5383 Other fatigue: Secondary | ICD-10-CM | POA: Diagnosis not present

## 2024-11-21 DIAGNOSIS — G8929 Other chronic pain: Secondary | ICD-10-CM

## 2024-11-21 DIAGNOSIS — M5459 Other low back pain: Secondary | ICD-10-CM

## 2024-11-21 DIAGNOSIS — R519 Headache, unspecified: Secondary | ICD-10-CM

## 2024-11-21 DIAGNOSIS — E7800 Pure hypercholesterolemia, unspecified: Secondary | ICD-10-CM

## 2024-11-21 DIAGNOSIS — M6281 Muscle weakness (generalized): Secondary | ICD-10-CM | POA: Diagnosis not present

## 2024-11-21 NOTE — Assessment & Plan Note (Signed)
 Chronic Currently well-controlled We had increased his gabapentin  from 300 mg at bedtime to 10 mg twice daily He is experiencing some fatigue and grogginess which could be result of the increased dose Decrease gabapentin  to once daily at night Monitor headaches if they worsen we will come up with a different medication

## 2024-11-21 NOTE — Progress Notes (Signed)
 "   Subjective:    Patient ID: [name], male    DOB: 1946-07-28, 79 y.o.   MRN: [id number]      HPI [name] is here for  Chief Complaint  Patient presents with   Fatigue    Thinks Statin is causing him to be fatigued and causing joint pain  He is here with his wife who also provides some history.  He has been had some episodes over the past few weeks where he gets chills, fever, achiness and feels like he is coming down with a cold.  His most recent episode was earlier this week.  1 of these episodes would associated with breaking out like hives/eczema.  He did see his dermatologist and was given a lotion and a cream and that did help.  There is no obvious cause for these episodes.  Cardiology did not feel like this was cardiac in nature.  He has also felt more fatigued and his wife states he has been more sleepy or groggy.  He wonders if the increased gabapentin  dose is contributing and the increase simvastatin  dose.  He wonders about decreasing these doses.  His headaches are better.  He does experiencing constipation which he wonders if this medication is causing that.   He has started a Mediterranean diet and he has lost 8 pounds.  Medications and allergies reviewed with patient and updated if appropriate.  Medications Ordered Prior to Encounter[1]  Review of Systems     Objective:   Vitals:   11/21/24 1514  BP: 128/80  Pulse: 65  Temp: 98.1 F (36.7 C)  SpO2: 98%   BP Readings from Last 3 Encounters:  11/21/24 128/80  09/30/24 (!) 125/97  09/15/24 131/76   Wt Readings from Last 3 Encounters:  11/21/24 232 lb (105.2 kg)  09/15/24 240 lb (108.9 kg)  09/04/24 242 lb (109.8 kg)   Body mass index is 27.51 kg/m.    Physical Exam Constitutional:      General: He is not in acute distress.    Appearance: Normal appearance. He is not ill-appearing.  HENT:     Head: Normocephalic and atraumatic.  Skin:    General: Skin is warm and dry.   Neurological:     Mental Status: He is alert. Mental status is at baseline.  Psychiatric:        Mood and Affect: Mood normal.        Behavior: Behavior normal.        Thought Content: Thought content normal.        Judgment: Judgment normal.            Assessment & Plan:    See Problem List for Assessment and Plan of chronic medical problems.          [1]  Current Outpatient Medications on File Prior to Visit  Medication Sig Dispense Refill   albuterol  (VENTOLIN  HFA) 108 (90 Base) MCG/ACT inhaler INHALE 1-2 PUFFS BY MOUTH INTO THE LUNGS EVERY 6 HOURS AS NEEDED. 8.5 each 0   allopurinol  (ZYLOPRIM ) 300 MG tablet Take 0.5 tablets (150 mg total) by mouth daily. 45 tablet 1   amLODipine  (NORVASC ) 5 MG tablet TAKE 1 TABLET (5 MG TOTAL) BY MOUTH DAILY. 90 tablet 1   amoxicillin  (AMOXIL ) 500 MG tablet Take 4 tablets (2,000 mg total) by mouth as needed (1 hr prior to dental procedures). 4 tablet 0   aspirin  EC 81 MG tablet Take 1 tablet (81 mg total) by mouth  daily. Swallow whole.     clopidogrel  (PLAVIX ) 75 MG tablet Take 1 tablet (75 mg total) by mouth daily. 90 tablet 1   famotidine  (PEPCID ) 20 MG tablet Take 20 mg by mouth at bedtime.     gabapentin  (NEURONTIN ) 300 MG capsule Take 1 capsule (300 mg total) by mouth 2 (two) times daily. 180 capsule 2   mometasone  (ELOCON ) 0.1 % lotion Apply topically daily. 60 mL 5   Multiple Vitamins-Minerals (MENS MULTIVITAMIN PO) Take by mouth.     pantoprazole  (PROTONIX ) 40 MG tablet Take 40 mg by mouth every morning.     sildenafil  (REVATIO ) 20 MG tablet TAKE 3 TO 5 TABLETS BY MOUTH AS NEEDED 100 tablet 3   simvastatin  (ZOCOR ) 80 MG tablet TAKE 1 TABLET BY MOUTH EVERY DAY 90 tablet 1   triamcinolone  ointment (KENALOG ) 0.1 % Apply 1 Application topically 2 (two) times daily.     valsartan  (DIOVAN ) 40 MG tablet TAKE 1 TABLET BY MOUTH EVERY DAY 90 tablet 3   WIXELA INHUB 100-50 MCG/ACT AEPB INHALE 1 PUFF INTO THE LUNGS TWICE A DAY 60 each 5    No current facility-administered medications on file prior to visit.   "

## 2024-11-21 NOTE — Assessment & Plan Note (Signed)
 Chronic Currently taking simvastatin  80 mg daily-this was increased from 40 mg daily to get his LDL at goal He is concerned he is having side effects from this Decrease simvastatin  back to 40 mg daily If he is still having symptoms we will consider Repatha or other option

## 2024-11-21 NOTE — Therapy (Signed)
 " OUTPATIENT PHYSICAL THERAPY TREATMENT   Patient Name: [name] MRN: [id number] DOB:May 31, 1946, 79 y.o., male Today's Date: 11/21/2024   END OF SESSION:  PT End of Session - 11/21/24 1106     Visit Number 16    Number of Visits 23    Date for Recertification  01/14/25    Authorization Type [employer]    PT Start Time 1100    PT Stop Time 1145    PT Time Calculation (min) 45 min    Activity Tolerance Patient tolerated treatment well    Behavior During Therapy WFL for tasks assessed/performed                         Past Medical History:  Diagnosis Date   Complete heart block (HCC)    S/P  PPM by Dr Kelsie   Coronary artery disease    Non-obstructive. Pt had left heart catheterization in August 2009 showing a 30% mid-circumflex lesion, otherwise coronaries were clean angiographically   Hyperlipidemia    Hypertension    OSA (obstructive sleep apnea)    Probable; study never scheduled   Pacemaker 04/04/2024   Presence of Watchman left atrial appendage closure device 07/31/2024   31 mm Watchman Device, JP   Past Surgical History:  Procedure Laterality Date   BIV UPGRADE N/A 04/04/2024   Procedure: BIV UPGRADE;  Surgeon: Mealor, Eulas BRAVO, MD;  Location: [facility] INVASIVE CV LAB;  Service: Cardiovascular;  Laterality: N/A;   CARDIAC CATHETERIZATION  1993, 2009   2009- negative   COLONOSCOPY  2011   negative, [facility]   LEFT ATRIAL APPENDAGE OCCLUSION N/A 07/31/2024   Procedure: LEFT ATRIAL APPENDAGE OCCLUSION;  Surgeon: Kennyth Chew, MD;  Location: [facility] INVASIVE CV LAB;  Service: Cardiovascular;  Laterality: N/A;   LEFT HEART CATH AND CORONARY ANGIOGRAPHY N/A 03/04/2024   Procedure: LEFT HEART CATH AND CORONARY ANGIOGRAPHY;  Surgeon: Wonda Sharper, MD;  Location: [facility] INVASIVE CV LAB;  Service: Cardiovascular;  Laterality: N/A;   PACEMAKER INSERTION  01/13/11   MDT by MILUS for complete heart block   PPM GENERATOR CHANGEOUT N/A 04/04/2024   Procedure:  PPM GENERATOR CHANGEOUT;  Surgeon: Nancey, Eulas BRAVO, MD;  Location: [facility] INVASIVE CV [facility];  Service: Cardiovascular;  Laterality: N/A;   TRANSESOPHAGEAL ECHOCARDIOGRAM (CATH LAB) N/A 07/31/2024   Procedure: TRANSESOPHAGEAL ECHOCARDIOGRAM;  Surgeon: Kennyth Chew, MD;  Location: [facility] INVASIVE CV LAB;  Service: Cardiovascular;  Laterality: N/A;   Patient Active Problem List   Diagnosis Date Noted   Presence of heart assist device (HCC) 09/04/2024   Presence of Watchman left atrial appendage closure device 07/31/2024   Persistent atrial fibrillation (HCC) 07/31/2024   Right leg swelling 07/28/2024   Pain in right lower leg 05/05/2024   CAD in native artery 02/14/2024   Chronic kidney disease, stage 3a (HCC) 02/10/2024   Cellulitis of left lower extremity 08/30/2023   Elevated LFTs 03/14/2023   Atrial flutter (HCC) 02/13/2023   Chronic left SI joint pain 12/22/2022   DOE (dyspnea on exertion) 12/22/2022   Chest pain, atypical 10/10/2022   Upper airway cough syndrome 10/10/2022   Chronic cough 02/01/2022   Chronic right SI joint pain 05/11/2021   Umbilical hernia without obstruction or gangrene 02/02/2021   Hepatic steatosis 08/05/2020   Aortic atherosclerosis 08/04/2020   Complex regional pain syndrome type I of the upper limb 07/26/2020   Flexion contracture of joint of hand 07/26/2020   Pacemaker 07/26/2020   Rib  pain on left side 06/09/2020   Decreased pulses in feet 06/09/2020   Erectile dysfunction 03/03/2017   Chronic headaches 02/13/2017   Snoring 02/15/2015   Dupuytren's contracture of both hands 09/02/2013   Stiffness of right hand joint 05/07/2013   Gout 12/27/2011   Essential hypertension 01/12/2011   Complete heart block (HCC) - PPM 01/12/2011   Prediabetes 03/02/2009   HYPERPLASIA PROSTATE UNS W/O UR OBST & OTH LUTS 08/13/2008   Hypercholesterolemia 02/17/2008   PAROXYSMAL SUPRAVENTRICULAR TACHYCARDIA 02/17/2008    PCP: Geofm Glade PARAS, MD  REFERRING PROVIDER:  Joane Artist RAMAN, MD  REFERRING DIAG: Chronic bilateral low back pain without sciatica; Chronic SI joint pain; Chronic left shoulder pain  Rationale for Evaluation and Treatment: Rehabilitation  THERAPY DIAG:  Other low back pain  Muscle weakness (generalized)  ONSET DATE: Chronic   SUBJECTIVE:          SUBJECTIVE STATEMENT: Patient reports his neck loosened up for a little while after last visit but it is still bothering him some today. He reports the lower back and SIJ region are still doing pretty good.  Eval: Patient reports he became inactive years ago and they found that his SIJ are screwed up and they get better with shots. He has had a fall recently and had a new pacemaker put in. He reports that if he walks extended periods of time then he will be hurting right around the back of the hips. He states he fell while in the hospital and with his new pacemaker he is not allowed to lift more that 10 lbs with the left arm. He does report feeling slightly off balance. He always uses a hand rail when going up and down stairs.   PERTINENT HISTORY:  See PMH above  PAIN:  Are you having pain? Yes:  NPRS scale: 2/10 currently Pain location: Bilateral lower back and SIJ region Pain description: Stiff, ache Aggravating factors: Worse in morning, walking or standing extended periods, anything that requires bending Relieving factors: Patches or sprays, medication  PRECAUTIONS: Fall  WEIGHT BEARING RESTRICTIONS: No  FALLS:  Has patient fallen in last 6 months? Yes. Number of falls 1  PATIENT GOALS: Get more flexible and move better.    OBJECTIVE:  Note: Objective measures were completed at Evaluation unless otherwise noted. PATIENT SURVEYS:  Modified Oswestry: 11/50 (22% disability)  07/16/2024: 10/50 (20% disability) 09/10/2024: 8/50 (16% disability) 11/19/2024: 6/50 (12% disability)  MUSCLE LENGTH: Limitation with hamstring, quad and hip flexibility  POSTURE:  Rounded shoulder  posture, remains in slightly knee flexed/crouched position, decreased lumbar lordosis  PALPATION: Tender to palpation bilateral lumbar paraspinals, PSIS and SIJ region, piriformis region  Lumbar CPA hypomobile throughout  LUMBAR ROM:   AROM eval  Flexion Limited and tight  Extension Limited and painful  Right lateral flexion   Left lateral flexion   Right rotation Limited and tight  Left rotation Limited and tight   (Blank rows = not tested)  LOWER EXTREMITY ROM:    Hip PROM grossly limited in all directions with tightness reported at end ranges  LOWER EXTREMITY MMT:    MMT Right eval Left eval Rt / Lt 07/16/2024  Hip flexion 4- 4-   Hip extension 3- 3- 3- / 3-  Hip abduction 3 3 3  / 3  Hip adduction     Hip internal rotation     Hip external rotation     Knee flexion 4 4 4  / 4  Knee extension 4 4 4  /  4  Ankle dorsiflexion     Ankle plantarflexion     Ankle inversion     Ankle eversion      (Blank rows = not tested)  FUNCTIONAL TESTS:  5 times sit to stand: 15 seconds  07/16/2024: 15 seconds  09/10/2024: 14 seconds  11/19/2024: 14 seconds  Romberg stance: patient demonstrates greater difficulty with eyes closed   GAIT: Assistive device utilized: None Level of assistance: Complete Independence Comments: Remains in relative knee flexed/crouched, toe out, unsteady gait   TREATMENT [facility] Adult PT Treatment:                                                DATE: 11/21/2024 UBE L2 x 4 min (fwd/bwd) to improve endurance and workload capacity Recumbent bike L4 x 6 min to improve endurance and workload capacity IASTM to bilateral upper trap and periscapular region with theragun Seated thoracic extension mobilizations Seated thoracic extension over back of chair Seated cervical rotational SNAG to right 2 x 5 Seated upper trap stretch 3 x 10 sec each Standing wall slide 2 x 5  PATIENT EDUCATION:  Education details: HEP Person educated: Patient Education method:  Programmer, Multimedia, Facilities Manager, Actor cues, Verbal cues Education comprehension: verbalized understanding, returned demonstration, verbal cues required, tactile cues required, and needs further education  HOME EXERCISE PROGRAM: Access Code: [email or web address]    ASSESSMENT: CLINICAL IMPRESSION: Patient tolerated therapy well with no adverse effects. He continues to report limited neck motion and more right sided muscular tightness so continued to focus on the cervical and thoracic region this visit. Incorporated more thoracic mobility primarily into extension and continued with IASTM and cervical mobility exercises. He did report improvement in his neck motion and pain following therapy but did still have some tightness at the levator scap region that can be further addressed in future sessions. No changes made to HEP this visit. Patient would benefit from continued skilled PT to progress mobility and strength in order to reduce pain and maximize functional ability.   Eval: Patient is a 79 y.o. male who was seen today for physical therapy evaluation and treatment for chronic lower back and pelvic pain, and recent fall.   OBJECTIVE IMPAIRMENTS: Abnormal gait, decreased activity tolerance, decreased balance, decreased ROM, decreased strength, impaired flexibility, postural dysfunction, and pain.   ACTIVITY LIMITATIONS: carrying, lifting, bending, standing, stairs, and locomotion level  PARTICIPATION LIMITATIONS: meal prep, cleaning, shopping, and community activity  PERSONAL FACTORS: Fitness, Past/current experiences, and Time since onset of injury/illness/exacerbation are also affecting patient's functional outcome.    GOALS: Goals reviewed with patient? Yes  SHORT TERM GOALS: Target date: 06/04/2024  Patient will be I with initial HEP in order to progress with therapy. Baseline: HEP provided at eval 06/11/2024: independent Goal status: MET  LONG TERM GOALS: Target date: 01/14/2025  Patient will be  I with final HEP to maintain progress from PT. Baseline: HEP provided at eval 07/16/2024: progressing 09/10/2024: progressing 11/19/2024: progressing Goal status: ONGOING  2.  Patient will report Modified Oswestry </= 5/50 (10% disability) in order to indicate an improvement in their functional status Baseline: 11/50 (22%) 07/16/2024: 10/50 (20% disability) 09/10/2024: 8/50 (16% disability) 11/19/2024: 6/50 (12% disability) Goal status: ONGOING  3.  Patient will demonstrate 5xSTS </= 12 seconds to indicate improvement in strength and reduce fall risk Baseline: 15 seconds 07/16/2024: 15 seconds 09/10/2024: 14 seconds  11/19/2024: 14 seconds Goal status: ONGOING  4.  Patient will be able to walk for extended periods without need to stop due to pain in order to improve his functional ability Baseline: patient reports frequent stops while walking due to pain 07/16/2024: reports continued limitation 09/10/2024: reports continued limitation 11/19/2024: reports continued limitation Goal status: ONGOING   PLAN: PT FREQUENCY: 1-2x/week  PT DURATION: 8 weeks  PLANNED INTERVENTIONS: 97164- PT Re-evaluation, 97750- Physical Performance Testing, 97110-Therapeutic exercises, 97530- Therapeutic activity, 97112- Neuromuscular re-education, 97535- Self Care, 02859- Manual therapy, 20560 (1-2 muscles), 20561 (3+ muscles)- Dry Needling, Patient/Family education, Balance training, Stair training, Taping, Joint mobilization, Joint manipulation, Spinal manipulation, Spinal mobilization, Cryotherapy, and Moist heat.  PLAN FOR NEXT SESSION: Review HEP and progress PRN, continue to work on LE and hip flexibility, progress LE, core, hip strengthening to improve activity tolerance, incorporate balance and stability exercises   Elaine Daring, PT, DPT, LAT, ATC 11/21/2024  11:54 AM Phone: [phone] Fax: [phone]   "

## 2024-11-21 NOTE — Assessment & Plan Note (Signed)
 New Has been experiencing increased fatigue, grogginess, episodes of not feeling well-feeling like he is coming down with something He has been more achy He wonders if medications are causing this Will decrease gabapentin  Will also decrease simvastatin  and see if that helps with his symptoms Can reevaluate if there is no improvement

## 2024-11-21 NOTE — Patient Instructions (Addendum)
" ° ° ° ° ° ° °  Medications changes include :   decrease gabapentin  to once a day at night.  Decrease simvastatin  back to 40 mg.     "

## 2024-11-24 ENCOUNTER — Ambulatory Visit: Admitting: Physical Therapy

## 2024-11-24 ENCOUNTER — Encounter: Payer: Self-pay | Admitting: Physical Therapy

## 2024-11-24 ENCOUNTER — Other Ambulatory Visit: Payer: Self-pay

## 2024-11-24 DIAGNOSIS — M5459 Other low back pain: Secondary | ICD-10-CM | POA: Diagnosis not present

## 2024-11-24 DIAGNOSIS — M6281 Muscle weakness (generalized): Secondary | ICD-10-CM

## 2024-11-24 NOTE — Therapy (Signed)
 " OUTPATIENT PHYSICAL THERAPY TREATMENT   Patient Name: [name] MRN: [id number] DOB:02-10-1946, 79 y.o., male Today's Date: 11/24/2024   END OF SESSION:  PT End of Session - 11/24/24 1338     Visit Number 17    Number of Visits 23    Date for Recertification  01/14/25    Authorization Type [employer]    PT Start Time 1300    PT Stop Time 1345    PT Time Calculation (min) 45 min    Activity Tolerance Patient tolerated treatment well    Behavior During Therapy WFL for tasks assessed/performed                          Past Medical History:  Diagnosis Date   Complete heart block (HCC)    S/P  PPM by Dr Kelsie   Coronary artery disease    Non-obstructive. Pt had left heart catheterization in August 2009 showing a 30% mid-circumflex lesion, otherwise coronaries were clean angiographically   Hyperlipidemia    Hypertension    OSA (obstructive sleep apnea)    Probable; study never scheduled   Pacemaker 04/04/2024   Presence of Watchman left atrial appendage closure device 07/31/2024   31 mm Watchman Device, JP   Past Surgical History:  Procedure Laterality Date   BIV UPGRADE N/A 04/04/2024   Procedure: BIV UPGRADE;  Surgeon: Mealor, Eulas BRAVO, MD;  Location: [facility] INVASIVE CV LAB;  Service: Cardiovascular;  Laterality: N/A;   CARDIAC CATHETERIZATION  1993, 2009   2009- negative   COLONOSCOPY  2011   negative, [facility]   LEFT ATRIAL APPENDAGE OCCLUSION N/A 07/31/2024   Procedure: LEFT ATRIAL APPENDAGE OCCLUSION;  Surgeon: Kennyth Chew, MD;  Location: [facility] INVASIVE CV LAB;  Service: Cardiovascular;  Laterality: N/A;   LEFT HEART CATH AND CORONARY ANGIOGRAPHY N/A 03/04/2024   Procedure: LEFT HEART CATH AND CORONARY ANGIOGRAPHY;  Surgeon: Wonda Sharper, MD;  Location: [facility] INVASIVE CV LAB;  Service: Cardiovascular;  Laterality: N/A;   PACEMAKER INSERTION  01/13/11   MDT by MILUS for complete heart block   PPM GENERATOR CHANGEOUT N/A 04/04/2024    Procedure: PPM GENERATOR CHANGEOUT;  Surgeon: Nancey, Eulas BRAVO, MD;  Location: [facility] INVASIVE CV [facility];  Service: Cardiovascular;  Laterality: N/A;   TRANSESOPHAGEAL ECHOCARDIOGRAM (CATH LAB) N/A 07/31/2024   Procedure: TRANSESOPHAGEAL ECHOCARDIOGRAM;  Surgeon: Kennyth Chew, MD;  Location: [facility] INVASIVE CV LAB;  Service: Cardiovascular;  Laterality: N/A;   Patient Active Problem List   Diagnosis Date Noted   Presence of heart assist device (HCC) 09/04/2024   Presence of Watchman left atrial appendage closure device 07/31/2024   Persistent atrial fibrillation (HCC) 07/31/2024   Right leg swelling 07/28/2024   Pain in right lower leg 05/05/2024   CAD in native artery 02/14/2024   Chronic kidney disease, stage 3a (HCC) 02/10/2024   Cellulitis of left lower extremity 08/30/2023   Elevated LFTs 03/14/2023   Atrial flutter (HCC) 02/13/2023   Chronic left SI joint pain 12/22/2022   DOE (dyspnea on exertion) 12/22/2022   Chest pain, atypical 10/10/2022   Upper airway cough syndrome 10/10/2022   Chronic cough 02/01/2022   Chronic right SI joint pain 05/11/2021   Umbilical hernia without obstruction or gangrene 02/02/2021   Hepatic steatosis 08/05/2020   Aortic atherosclerosis 08/04/2020   Complex regional pain syndrome type I of the upper limb 07/26/2020   Flexion contracture of joint of hand 07/26/2020   Pacemaker 07/26/2020  Rib pain on left side 06/09/2020   Decreased pulses in feet 06/09/2020   Erectile dysfunction 03/03/2017   Chronic headaches 02/13/2017   Snoring 02/15/2015   Dupuytren's contracture of both hands 09/02/2013   Stiffness of right hand joint 05/07/2013   Gout 12/27/2011   Essential hypertension 01/12/2011   Complete heart block (HCC) - PPM 01/12/2011   Fatigue 01/12/2011   Prediabetes 03/02/2009   HYPERPLASIA PROSTATE UNS W/O UR OBST & OTH LUTS 08/13/2008   Hypercholesterolemia 02/17/2008   PAROXYSMAL SUPRAVENTRICULAR TACHYCARDIA 02/17/2008    PCP: Geofm Glade PARAS, MD  REFERRING PROVIDER: Joane Artist RAMAN, MD  REFERRING DIAG: Chronic bilateral low back pain without sciatica; Chronic SI joint pain; Chronic left shoulder pain  Rationale for Evaluation and Treatment: Rehabilitation  THERAPY DIAG:  Other low back pain  Muscle weakness (generalized)  ONSET DATE: Chronic   SUBJECTIVE:          SUBJECTIVE STATEMENT: Patient reports his neck loosened up for a little while after last visit but it is still bothering him some today. He reports the lower back and SIJ region are still doing pretty good.  Eval: Patient reports he became inactive years ago and they found that his SIJ are screwed up and they get better with shots. He has had a fall recently and had a new pacemaker put in. He reports that if he walks extended periods of time then he will be hurting right around the back of the hips. He states he fell while in the hospital and with his new pacemaker he is not allowed to lift more that 10 lbs with the left arm. He does report feeling slightly off balance. He always uses a hand rail when going up and down stairs.   PERTINENT HISTORY:  See PMH above  PAIN:  Are you having pain? Yes:  NPRS scale: 2/10 currently Pain location: Bilateral lower back and SIJ region Pain description: Stiff, ache Aggravating factors: Worse in morning, walking or standing extended periods, anything that requires bending Relieving factors: Patches or sprays, medication  PRECAUTIONS: Fall  WEIGHT BEARING RESTRICTIONS: No  FALLS:  Has patient fallen in last 6 months? Yes. Number of falls 1  PATIENT GOALS: Get more flexible and move better.    OBJECTIVE:  Note: Objective measures were completed at Evaluation unless otherwise noted. PATIENT SURVEYS:  Modified Oswestry: 11/50 (22% disability)  07/16/2024: 10/50 (20% disability) 09/10/2024: 8/50 (16% disability) 11/19/2024: 6/50 (12% disability)  MUSCLE LENGTH: Limitation with hamstring, quad and hip  flexibility  POSTURE:  Rounded shoulder posture, remains in slightly knee flexed/crouched position, decreased lumbar lordosis  PALPATION: Tender to palpation bilateral lumbar paraspinals, PSIS and SIJ region, piriformis region  Lumbar CPA hypomobile throughout  LUMBAR ROM:   AROM eval  Flexion Limited and tight  Extension Limited and painful  Right lateral flexion   Left lateral flexion   Right rotation Limited and tight  Left rotation Limited and tight   (Blank rows = not tested)  LOWER EXTREMITY ROM:    Hip PROM grossly limited in all directions with tightness reported at end ranges  LOWER EXTREMITY MMT:    MMT Right eval Left eval Rt / Lt 07/16/2024  Hip flexion 4- 4-   Hip extension 3- 3- 3- / 3-  Hip abduction 3 3 3  / 3  Hip adduction     Hip internal rotation     Hip external rotation     Knee flexion 4 4 4  / 4  Knee extension 4 4 4  / 4  Ankle dorsiflexion     Ankle plantarflexion     Ankle inversion     Ankle eversion      (Blank rows = not tested)  FUNCTIONAL TESTS:  5 times sit to stand: 15 seconds  07/16/2024: 15 seconds  09/10/2024: 14 seconds  11/19/2024: 14 seconds  Romberg stance: patient demonstrates greater difficulty with eyes closed   GAIT: Assistive device utilized: None Level of assistance: Complete Independence Comments: Remains in relative knee flexed/crouched, toe out, unsteady gait   TREATMENT [facility] Adult PT Treatment:                                                DATE: 11/24/2024 UBE L3 x 5 min (fwd/bwd) to improve endurance and workload capacity Recumbent bike L4 x 5 min to improve endurance and workload capacity STM / TPR for bilateral upper trap, levator scap, and rhomboid region IASTM to bilateral upper trap and periscapular region with theragun Row with blue 2 x 15 Extension and scap retraction with blue 2 x 10 Standing horizontal abduction with yellow 2 x 10   PATIENT EDUCATION:  Education details: HEP Person educated:  Patient Education method: Programmer, Multimedia, Demonstration, Actor cues, Verbal cues Education comprehension: verbalized understanding, returned demonstration, verbal cues required, tactile cues required, and needs further education  HOME EXERCISE PROGRAM: Access Code: [email or web address]    ASSESSMENT: CLINICAL IMPRESSION: Patient tolerated therapy well with no adverse effects. Therapy continues focus on reducing cervical muscular tension to improve his mobility and progressing his postural control. Was a little more aggressive regarding manual therapy for cervical musculature and patient did report improvement following therapy. No changes made to HEP this visit. Patient would benefit from continued skilled PT to progress mobility and strength in order to reduce pain and maximize functional ability.   Eval: Patient is a 79 y.o. male who was seen today for physical therapy evaluation and treatment for chronic lower back and pelvic pain, and recent fall.   OBJECTIVE IMPAIRMENTS: Abnormal gait, decreased activity tolerance, decreased balance, decreased ROM, decreased strength, impaired flexibility, postural dysfunction, and pain.   ACTIVITY LIMITATIONS: carrying, lifting, bending, standing, stairs, and locomotion level  PARTICIPATION LIMITATIONS: meal prep, cleaning, shopping, and community activity  PERSONAL FACTORS: Fitness, Past/current experiences, and Time since onset of injury/illness/exacerbation are also affecting patient's functional outcome.    GOALS: Goals reviewed with patient? Yes  SHORT TERM GOALS: Target date: 06/04/2024  Patient will be I with initial HEP in order to progress with therapy. Baseline: HEP provided at eval 06/11/2024: independent Goal status: MET  LONG TERM GOALS: Target date: 01/14/2025  Patient will be I with final HEP to maintain progress from PT. Baseline: HEP provided at eval 07/16/2024: progressing 09/10/2024: progressing 11/19/2024: progressing Goal status:  ONGOING  2.  Patient will report Modified Oswestry </= 5/50 (10% disability) in order to indicate an improvement in their functional status Baseline: 11/50 (22%) 07/16/2024: 10/50 (20% disability) 09/10/2024: 8/50 (16% disability) 11/19/2024: 6/50 (12% disability) Goal status: ONGOING  3.  Patient will demonstrate 5xSTS </= 12 seconds to indicate improvement in strength and reduce fall risk Baseline: 15 seconds 07/16/2024: 15 seconds 09/10/2024: 14 seconds 11/19/2024: 14 seconds Goal status: ONGOING  4.  Patient will be able to walk for extended periods without need to stop due to pain in order to improve  his functional ability Baseline: patient reports frequent stops while walking due to pain 07/16/2024: reports continued limitation 09/10/2024: reports continued limitation 11/19/2024: reports continued limitation Goal status: ONGOING   PLAN: PT FREQUENCY: 1-2x/week  PT DURATION: 8 weeks  PLANNED INTERVENTIONS: 97164- PT Re-evaluation, 97750- Physical Performance Testing, 97110-Therapeutic exercises, 97530- Therapeutic activity, 97112- Neuromuscular re-education, 97535- Self Care, 02859- Manual therapy, 20560 (1-2 muscles), 20561 (3+ muscles)- Dry Needling, Patient/Family education, Balance training, Stair training, Taping, Joint mobilization, Joint manipulation, Spinal manipulation, Spinal mobilization, Cryotherapy, and Moist heat.  PLAN FOR NEXT SESSION: Review HEP and progress PRN, continue to work on LE and hip flexibility, progress LE, core, hip strengthening to improve activity tolerance, incorporate balance and stability exercises   Elaine Daring, PT, DPT, LAT, ATC 11/24/2024  2:24 PM Phone: [phone] Fax: [phone]   "

## 2024-11-27 ENCOUNTER — Ambulatory Visit: Admitting: Physical Therapy

## 2024-11-27 ENCOUNTER — Encounter: Payer: Self-pay | Admitting: Physical Therapy

## 2024-11-27 ENCOUNTER — Other Ambulatory Visit: Payer: Self-pay

## 2024-11-27 DIAGNOSIS — M5459 Other low back pain: Secondary | ICD-10-CM | POA: Diagnosis not present

## 2024-11-27 DIAGNOSIS — M6281 Muscle weakness (generalized): Secondary | ICD-10-CM

## 2024-11-27 NOTE — Therapy (Signed)
 " OUTPATIENT PHYSICAL THERAPY TREATMENT   Patient Name: [name] MRN: [id number] DOB:1946-08-12, 79 y.o., male Today's Date: 11/27/2024   END OF SESSION:  PT End of Session - 11/27/24 1307     Visit Number 18    Number of Visits 23    Date for Recertification  01/14/25    Authorization Type [employer]    PT Start Time 1305    PT Stop Time 1345    PT Time Calculation (min) 40 min    Activity Tolerance Patient tolerated treatment well    Behavior During Therapy WFL for tasks assessed/performed                           Past Medical History:  Diagnosis Date   Complete heart block (HCC)    S/P  PPM by Dr Kelsie   Coronary artery disease    Non-obstructive. Pt had left heart catheterization in August 2009 showing a 30% mid-circumflex lesion, otherwise coronaries were clean angiographically   Hyperlipidemia    Hypertension    OSA (obstructive sleep apnea)    Probable; study never scheduled   Pacemaker 04/04/2024   Presence of Watchman left atrial appendage closure device 07/31/2024   31 mm Watchman Device, JP   Past Surgical History:  Procedure Laterality Date   BIV UPGRADE N/A 04/04/2024   Procedure: BIV UPGRADE;  Surgeon: Mealor, Eulas BRAVO, MD;  Location: [facility] INVASIVE CV LAB;  Service: Cardiovascular;  Laterality: N/A;   CARDIAC CATHETERIZATION  1993, 2009   2009- negative   COLONOSCOPY  2011   negative, [facility]   LEFT ATRIAL APPENDAGE OCCLUSION N/A 07/31/2024   Procedure: LEFT ATRIAL APPENDAGE OCCLUSION;  Surgeon: Kennyth Chew, MD;  Location: [facility] INVASIVE CV LAB;  Service: Cardiovascular;  Laterality: N/A;   LEFT HEART CATH AND CORONARY ANGIOGRAPHY N/A 03/04/2024   Procedure: LEFT HEART CATH AND CORONARY ANGIOGRAPHY;  Surgeon: Wonda Sharper, MD;  Location: [facility] INVASIVE CV LAB;  Service: Cardiovascular;  Laterality: N/A;   PACEMAKER INSERTION  01/13/11   MDT by MILUS for complete heart block   PPM GENERATOR CHANGEOUT N/A 04/04/2024    Procedure: PPM GENERATOR CHANGEOUT;  Surgeon: Nancey, Eulas BRAVO, MD;  Location: [facility] INVASIVE CV LAB;  Service: Cardiovascular;  Laterality: N/A;   TRANSESOPHAGEAL ECHOCARDIOGRAM (CATH LAB) N/A 07/31/2024   Procedure: TRANSESOPHAGEAL ECHOCARDIOGRAM;  Surgeon: Kennyth Chew, MD;  Location: [facility] INVASIVE CV LAB;  Service: Cardiovascular;  Laterality: N/A;   Patient Active Problem List   Diagnosis Date Noted   Presence of heart assist device (HCC) 09/04/2024   Presence of Watchman left atrial appendage closure device 07/31/2024   Persistent atrial fibrillation (HCC) 07/31/2024   Right leg swelling 07/28/2024   Pain in right lower leg 05/05/2024   CAD in native artery 02/14/2024   Chronic kidney disease, stage 3a (HCC) 02/10/2024   Cellulitis of left lower extremity 08/30/2023   Elevated LFTs 03/14/2023   Atrial flutter (HCC) 02/13/2023   Chronic left SI joint pain 12/22/2022   DOE (dyspnea on exertion) 12/22/2022   Chest pain, atypical 10/10/2022   Upper airway cough syndrome 10/10/2022   Chronic cough 02/01/2022   Chronic right SI joint pain 05/11/2021   Umbilical hernia without obstruction or gangrene 02/02/2021   Hepatic steatosis 08/05/2020   Aortic atherosclerosis 08/04/2020   Complex regional pain syndrome type I of the upper limb 07/26/2020   Flexion contracture of joint of hand 07/26/2020   Pacemaker 07/26/2020  Rib pain on left side 06/09/2020   Decreased pulses in feet 06/09/2020   Erectile dysfunction 03/03/2017   Chronic headaches 02/13/2017   Snoring 02/15/2015   Dupuytren's contracture of both hands 09/02/2013   Stiffness of right hand joint 05/07/2013   Gout 12/27/2011   Essential hypertension 01/12/2011   Complete heart block (HCC) - PPM 01/12/2011   Fatigue 01/12/2011   Prediabetes 03/02/2009   HYPERPLASIA PROSTATE UNS W/O UR OBST & OTH LUTS 08/13/2008   Hypercholesterolemia 02/17/2008   PAROXYSMAL SUPRAVENTRICULAR TACHYCARDIA 02/17/2008    PCP: Geofm Glade PARAS, MD  REFERRING PROVIDER: Joane Artist RAMAN, MD  REFERRING DIAG: Chronic bilateral low back pain without sciatica; Chronic SI joint pain; Chronic left shoulder pain  Rationale for Evaluation and Treatment: Rehabilitation  THERAPY DIAG:  Other low back pain  Muscle weakness (generalized)  ONSET DATE: Chronic   SUBJECTIVE:          SUBJECTIVE STATEMENT: Patient reports his neck is feeling better. He got his theragun in and has been using that more consistently.  Eval: Patient reports he became inactive years ago and they found that his SIJ are screwed up and they get better with shots. He has had a fall recently and had a new pacemaker put in. He reports that if he walks extended periods of time then he will be hurting right around the back of the hips. He states he fell while in the hospital and with his new pacemaker he is not allowed to lift more that 10 lbs with the left arm. He does report feeling slightly off balance. He always uses a hand rail when going up and down stairs.   PERTINENT HISTORY:  See PMH above  PAIN:  Are you having pain? Yes:  NPRS scale: 2/10 currently Pain location: Bilateral lower back and SIJ region Pain description: Stiff, ache Aggravating factors: Worse in morning, walking or standing extended periods, anything that requires bending Relieving factors: Patches or sprays, medication  PRECAUTIONS: Fall  WEIGHT BEARING RESTRICTIONS: No  FALLS:  Has patient fallen in last 6 months? Yes. Number of falls 1  PATIENT GOALS: Get more flexible and move better.    OBJECTIVE:  Note: Objective measures were completed at Evaluation unless otherwise noted. PATIENT SURVEYS:  Modified Oswestry: 11/50 (22% disability)  07/16/2024: 10/50 (20% disability) 09/10/2024: 8/50 (16% disability) 11/19/2024: 6/50 (12% disability)  MUSCLE LENGTH: Limitation with hamstring, quad and hip flexibility  POSTURE:  Rounded shoulder posture, remains in slightly knee  flexed/crouched position, decreased lumbar lordosis  PALPATION: Tender to palpation bilateral lumbar paraspinals, PSIS and SIJ region, piriformis region  Lumbar CPA hypomobile throughout  LUMBAR ROM:   AROM eval  Flexion Limited and tight  Extension Limited and painful  Right lateral flexion   Left lateral flexion   Right rotation Limited and tight  Left rotation Limited and tight   (Blank rows = not tested)  LOWER EXTREMITY ROM:    Hip PROM grossly limited in all directions with tightness reported at end ranges  LOWER EXTREMITY MMT:    MMT Right eval Left eval Rt / Lt 07/16/2024  Hip flexion 4- 4-   Hip extension 3- 3- 3- / 3-  Hip abduction 3 3 3  / 3  Hip adduction     Hip internal rotation     Hip external rotation     Knee flexion 4 4 4  / 4  Knee extension 4 4 4  / 4  Ankle dorsiflexion     Ankle  plantarflexion     Ankle inversion     Ankle eversion      (Blank rows = not tested)  FUNCTIONAL TESTS:  5 times sit to stand: 15 seconds  07/16/2024: 15 seconds  09/10/2024: 14 seconds  11/19/2024: 14 seconds  Romberg stance: patient demonstrates greater difficulty with eyes closed   GAIT: Assistive device utilized: None Level of assistance: Complete Independence Comments: Remains in relative knee flexed/crouched, toe out, unsteady gait   TREATMENT [facility] Adult PT Treatment:                                                DATE: 11/27/2024 Recumbent bike L4 x 10 min to improve endurance and workload capacity Seated stability ball roll out x 10 Seated thoracic rotation with dowel behind shoulders x 5 each Standing wall slide for shoulder elevation and thoracic extension x 10 Seated manual thoracic extension mobs  Doorway pec stretch Row with blue 3 x 15 Deadlift from 10 box with 25# 3 x 10 Standing hip extension and abduction 2 x 10 each  PATIENT EDUCATION:  Education details: HEP Person educated: Patient Education method: Programmer, Multimedia, Demonstration, Actor  cues, Verbal cues Education comprehension: verbalized understanding, returned demonstration, verbal cues required, tactile cues required, and needs further education  HOME EXERCISE PROGRAM: Access Code: [email or web address]    ASSESSMENT: CLINICAL IMPRESSION: Patient tolerated therapy well with no adverse effects. Therapy focused on continued strengthening and mobility work to improve his posture and activity tolerance. He did report improvement in his neck and upper back pain but does still report stiffness so continued working on thoracic spine mobility. He was able to work back into lifting and hip strengthening this visit. No changes made to his HEP this visit. Patient would benefit from continued skilled PT to progress mobility and strength in order to reduce pain and maximize functional ability.   Eval: Patient is a 79 y.o. male who was seen today for physical therapy evaluation and treatment for chronic lower back and pelvic pain, and recent fall.   OBJECTIVE IMPAIRMENTS: Abnormal gait, decreased activity tolerance, decreased balance, decreased ROM, decreased strength, impaired flexibility, postural dysfunction, and pain.   ACTIVITY LIMITATIONS: carrying, lifting, bending, standing, stairs, and locomotion level  PARTICIPATION LIMITATIONS: meal prep, cleaning, shopping, and community activity  PERSONAL FACTORS: Fitness, Past/current experiences, and Time since onset of injury/illness/exacerbation are also affecting patient's functional outcome.    GOALS: Goals reviewed with patient? Yes  SHORT TERM GOALS: Target date: 06/04/2024  Patient will be I with initial HEP in order to progress with therapy. Baseline: HEP provided at eval 06/11/2024: independent Goal status: MET  LONG TERM GOALS: Target date: 01/14/2025  Patient will be I with final HEP to maintain progress from PT. Baseline: HEP provided at eval 07/16/2024: progressing 09/10/2024: progressing 11/19/2024: progressing Goal status:  ONGOING  2.  Patient will report Modified Oswestry </= 5/50 (10% disability) in order to indicate an improvement in their functional status Baseline: 11/50 (22%) 07/16/2024: 10/50 (20% disability) 09/10/2024: 8/50 (16% disability) 11/19/2024: 6/50 (12% disability) Goal status: ONGOING  3.  Patient will demonstrate 5xSTS </= 12 seconds to indicate improvement in strength and reduce fall risk Baseline: 15 seconds 07/16/2024: 15 seconds 09/10/2024: 14 seconds 11/19/2024: 14 seconds Goal status: ONGOING  4.  Patient will be able to walk for extended periods without need to stop due to  pain in order to improve his functional ability Baseline: patient reports frequent stops while walking due to pain 07/16/2024: reports continued limitation 09/10/2024: reports continued limitation 11/19/2024: reports continued limitation Goal status: ONGOING   PLAN: PT FREQUENCY: 1-2x/week  PT DURATION: 8 weeks  PLANNED INTERVENTIONS: 97164- PT Re-evaluation, 97750- Physical Performance Testing, 97110-Therapeutic exercises, 97530- Therapeutic activity, 97112- Neuromuscular re-education, 97535- Self Care, 97140- Manual therapy, 20560 (1-2 muscles), 20561 (3+ muscles)- Dry Needling, Patient/Family education, Balance training, Stair training, Taping, Joint mobilization, Joint manipulation, Spinal manipulation, Spinal mobilization, Cryotherapy, and Moist heat.  PLAN FOR NEXT SESSION: Review HEP and progress PRN, continue to work on LE and hip flexibility, progress LE, core, hip strengthening to improve activity tolerance, incorporate balance and stability exercises   Elaine Daring, PT, DPT, LAT, ATC 11/27/24  2:44 PM Phone: [phone] Fax: [phone]   "

## 2024-12-01 ENCOUNTER — Other Ambulatory Visit: Payer: Self-pay

## 2024-12-01 ENCOUNTER — Ambulatory Visit: Admitting: Physical Therapy

## 2024-12-01 ENCOUNTER — Encounter: Payer: Self-pay | Admitting: Physical Therapy

## 2024-12-01 DIAGNOSIS — M5459 Other low back pain: Secondary | ICD-10-CM

## 2024-12-01 DIAGNOSIS — M6281 Muscle weakness (generalized): Secondary | ICD-10-CM | POA: Diagnosis not present

## 2024-12-01 NOTE — Therapy (Signed)
 " OUTPATIENT PHYSICAL THERAPY TREATMENT   Patient Name: [name] MRN: [id number] DOB:10-27-1946, 79 y.o., male Today's Date: 12/01/2024   END OF SESSION:  PT End of Session - 12/01/24 1309     Visit Number 19    Number of Visits 23    Date for Recertification  01/14/25    Authorization Type [employer]    PT Start Time 1304    PT Stop Time 1345    PT Time Calculation (min) 41 min    Activity Tolerance Patient tolerated treatment well    Behavior During Therapy WFL for tasks assessed/performed                            Past Medical History:  Diagnosis Date   Complete heart block (HCC)    S/P  PPM by Dr Kelsie   Coronary artery disease    Non-obstructive. Pt had left heart catheterization in August 2009 showing a 30% mid-circumflex lesion, otherwise coronaries were clean angiographically   Hyperlipidemia    Hypertension    OSA (obstructive sleep apnea)    Probable; study never scheduled   Pacemaker 04/04/2024   Presence of Watchman left atrial appendage closure device 07/31/2024   31 mm Watchman Device, JP   Past Surgical History:  Procedure Laterality Date   BIV UPGRADE N/A 04/04/2024   Procedure: BIV UPGRADE;  Surgeon: Mealor, Eulas BRAVO, MD;  Location: [facility] INVASIVE CV LAB;  Service: Cardiovascular;  Laterality: N/A;   CARDIAC CATHETERIZATION  1993, 2009   2009- negative   COLONOSCOPY  2011   negative, [facility]   LEFT ATRIAL APPENDAGE OCCLUSION N/A 07/31/2024   Procedure: LEFT ATRIAL APPENDAGE OCCLUSION;  Surgeon: Kennyth Chew, MD;  Location: [facility] INVASIVE CV LAB;  Service: Cardiovascular;  Laterality: N/A;   LEFT HEART CATH AND CORONARY ANGIOGRAPHY N/A 03/04/2024   Procedure: LEFT HEART CATH AND CORONARY ANGIOGRAPHY;  Surgeon: Wonda Sharper, MD;  Location: [facility] INVASIVE CV LAB;  Service: Cardiovascular;  Laterality: N/A;   PACEMAKER INSERTION  01/13/11   MDT by MILUS for complete heart block   PPM GENERATOR CHANGEOUT N/A 04/04/2024    Procedure: PPM GENERATOR CHANGEOUT;  Surgeon: Nancey, Eulas BRAVO, MD;  Location: [facility] INVASIVE CV LAB;  Service: Cardiovascular;  Laterality: N/A;   TRANSESOPHAGEAL ECHOCARDIOGRAM (CATH LAB) N/A 07/31/2024   Procedure: TRANSESOPHAGEAL ECHOCARDIOGRAM;  Surgeon: Kennyth Chew, MD;  Location: [facility] INVASIVE CV LAB;  Service: Cardiovascular;  Laterality: N/A;   Patient Active Problem List   Diagnosis Date Noted   Presence of heart assist device (HCC) 09/04/2024   Presence of Watchman left atrial appendage closure device 07/31/2024   Persistent atrial fibrillation (HCC) 07/31/2024   Right leg swelling 07/28/2024   Pain in right lower leg 05/05/2024   CAD in native artery 02/14/2024   Chronic kidney disease, stage 3a (HCC) 02/10/2024   Cellulitis of left lower extremity 08/30/2023   Elevated LFTs 03/14/2023   Atrial flutter (HCC) 02/13/2023   Chronic left SI joint pain 12/22/2022   DOE (dyspnea on exertion) 12/22/2022   Chest pain, atypical 10/10/2022   Upper airway cough syndrome 10/10/2022   Chronic cough 02/01/2022   Chronic right SI joint pain 05/11/2021   Umbilical hernia without obstruction or gangrene 02/02/2021   Hepatic steatosis 08/05/2020   Aortic atherosclerosis 08/04/2020   Complex regional pain syndrome type I of the upper limb 07/26/2020   Flexion contracture of joint of hand 07/26/2020   Pacemaker 07/26/2020  Rib pain on left side 06/09/2020   Decreased pulses in feet 06/09/2020   Erectile dysfunction 03/03/2017   Chronic headaches 02/13/2017   Snoring 02/15/2015   Dupuytren's contracture of both hands 09/02/2013   Stiffness of right hand joint 05/07/2013   Gout 12/27/2011   Essential hypertension 01/12/2011   Complete heart block (HCC) - PPM 01/12/2011   Fatigue 01/12/2011   Prediabetes 03/02/2009   HYPERPLASIA PROSTATE UNS W/O UR OBST & OTH LUTS 08/13/2008   Hypercholesterolemia 02/17/2008   PAROXYSMAL SUPRAVENTRICULAR TACHYCARDIA 02/17/2008    PCP: Geofm Glade PARAS, MD  REFERRING PROVIDER: Joane Artist RAMAN, MD  REFERRING DIAG: Chronic bilateral low back pain without sciatica; Chronic SI joint pain; Chronic left shoulder pain  Rationale for Evaluation and Treatment: Rehabilitation  THERAPY DIAG:  Other low back pain  Muscle weakness (generalized)  ONSET DATE: Chronic   SUBJECTIVE:          SUBJECTIVE STATEMENT: Patient reports his SIJs are doing pretty good but the left neck is still bothering him.   Eval: Patient reports he became inactive years ago and they found that his SIJ are screwed up and they get better with shots. He has had a fall recently and had a new pacemaker put in. He reports that if he walks extended periods of time then he will be hurting right around the back of the hips. He states he fell while in the hospital and with his new pacemaker he is not allowed to lift more that 10 lbs with the left arm. He does report feeling slightly off balance. He always uses a hand rail when going up and down stairs.   PERTINENT HISTORY:  See PMH above  PAIN:  Are you having pain? Yes:  NPRS scale: 2/10 currently Pain location: Bilateral lower back and SIJ region Pain description: Stiff, ache Aggravating factors: Worse in morning, walking or standing extended periods, anything that requires bending Relieving factors: Patches or sprays, medication  PRECAUTIONS: Fall  WEIGHT BEARING RESTRICTIONS: No  FALLS:  Has patient fallen in last 6 months? Yes. Number of falls 1  PATIENT GOALS: Get more flexible and move better.    OBJECTIVE:  Note: Objective measures were completed at Evaluation unless otherwise noted. PATIENT SURVEYS:  Modified Oswestry: 11/50 (22% disability)  07/16/2024: 10/50 (20% disability) 09/10/2024: 8/50 (16% disability) 11/19/2024: 6/50 (12% disability)  MUSCLE LENGTH: Limitation with hamstring, quad and hip flexibility  POSTURE:  Rounded shoulder posture, remains in slightly knee flexed/crouched position,  decreased lumbar lordosis  PALPATION: Tender to palpation bilateral lumbar paraspinals, PSIS and SIJ region, piriformis region  Lumbar CPA hypomobile throughout  LUMBAR ROM:   AROM eval  Flexion Limited and tight  Extension Limited and painful  Right lateral flexion   Left lateral flexion   Right rotation Limited and tight  Left rotation Limited and tight   (Blank rows = not tested)  LOWER EXTREMITY ROM:    Hip PROM grossly limited in all directions with tightness reported at end ranges  LOWER EXTREMITY MMT:    MMT Right eval Left eval Rt / Lt 07/16/2024  Hip flexion 4- 4-   Hip extension 3- 3- 3- / 3-  Hip abduction 3 3 3  / 3  Hip adduction     Hip internal rotation     Hip external rotation     Knee flexion 4 4 4  / 4  Knee extension 4 4 4  / 4  Ankle dorsiflexion     Ankle plantarflexion  Ankle inversion     Ankle eversion      (Blank rows = not tested)  FUNCTIONAL TESTS:  5 times sit to stand: 15 seconds  07/16/2024: 15 seconds  09/10/2024: 14 seconds  11/19/2024: 14 seconds  Romberg stance: patient demonstrates greater difficulty with eyes closed   GAIT: Assistive device utilized: None Level of assistance: Complete Independence Comments: Remains in relative knee flexed/crouched, toe out, unsteady gait   TREATMENT [facility] Adult PT Treatment:                                                DATE: 12/01/2024 Recumbent bike L4 x 10 min to improve endurance and workload capacity IASTM using theragun for left levator scap, upper trap, rhomboid  Standing wall slide for shoulder elevation and thoracic extension x 10 Standing thoracic rotation at wall x 10 each Doorway pec stretch Row with blue 2 x 15 Extension and scap retraction with blue 2 x 15 Deadlift from 10 box with 25# 3 x 10  PATIENT EDUCATION:  Education details: HEP Person educated: Patient Education method: Programmer, Multimedia, Demonstration, Actor cues, Verbal cues Education comprehension: verbalized  understanding, returned demonstration, verbal cues required, tactile cues required, and needs further education  HOME EXERCISE PROGRAM: Access Code: [email or web address]    ASSESSMENT: CLINICAL IMPRESSION: Patient tolerated therapy well with no adverse effects. Therapy continued to work on cervical and thoracic mobility, stretching for postural control, and progressing posterior chain strengthening with good tolerance. He seems to be progressing well with his lifting and reports his lower back/hips are doing well. He did report improvement in left neck stiffness following therapy. No changes to HEP this visit. Patient would benefit from continued skilled PT to progress mobility and strength in order to reduce pain and maximize functional ability.   Eval: Patient is a 79 y.o. male who was seen today for physical therapy evaluation and treatment for chronic lower back and pelvic pain, and recent fall.   OBJECTIVE IMPAIRMENTS: Abnormal gait, decreased activity tolerance, decreased balance, decreased ROM, decreased strength, impaired flexibility, postural dysfunction, and pain.   ACTIVITY LIMITATIONS: carrying, lifting, bending, standing, stairs, and locomotion level  PARTICIPATION LIMITATIONS: meal prep, cleaning, shopping, and community activity  PERSONAL FACTORS: Fitness, Past/current experiences, and Time since onset of injury/illness/exacerbation are also affecting patient's functional outcome.    GOALS: Goals reviewed with patient? Yes  SHORT TERM GOALS: Target date: 06/04/2024  Patient will be I with initial HEP in order to progress with therapy. Baseline: HEP provided at eval 06/11/2024: independent Goal status: MET  LONG TERM GOALS: Target date: 01/14/2025  Patient will be I with final HEP to maintain progress from PT. Baseline: HEP provided at eval 07/16/2024: progressing 09/10/2024: progressing 11/19/2024: progressing Goal status: ONGOING  2.  Patient will report Modified Oswestry </=  5/50 (10% disability) in order to indicate an improvement in their functional status Baseline: 11/50 (22%) 07/16/2024: 10/50 (20% disability) 09/10/2024: 8/50 (16% disability) 11/19/2024: 6/50 (12% disability) Goal status: ONGOING  3.  Patient will demonstrate 5xSTS </= 12 seconds to indicate improvement in strength and reduce fall risk Baseline: 15 seconds 07/16/2024: 15 seconds 09/10/2024: 14 seconds 11/19/2024: 14 seconds Goal status: ONGOING  4.  Patient will be able to walk for extended periods without need to stop due to pain in order to improve his functional ability Baseline: patient reports frequent stops while  walking due to pain 07/16/2024: reports continued limitation 09/10/2024: reports continued limitation 11/19/2024: reports continued limitation Goal status: ONGOING   PLAN: PT FREQUENCY: 1-2x/week  PT DURATION: 8 weeks  PLANNED INTERVENTIONS: 97164- PT Re-evaluation, 97750- Physical Performance Testing, 97110-Therapeutic exercises, 97530- Therapeutic activity, 97112- Neuromuscular re-education, 97535- Self Care, 02859- Manual therapy, 20560 (1-2 muscles), 20561 (3+ muscles)- Dry Needling, Patient/Family education, Balance training, Stair training, Taping, Joint mobilization, Joint manipulation, Spinal manipulation, Spinal mobilization, Cryotherapy, and Moist heat.  PLAN FOR NEXT SESSION: Review HEP and progress PRN, continue to work on LE and hip flexibility, progress LE, core, hip strengthening to improve activity tolerance, incorporate balance and stability exercises   Elaine Daring, PT, DPT, LAT, ATC 12/01/24  1:48 PM Phone: [phone] Fax: [phone]   "

## 2024-12-04 ENCOUNTER — Ambulatory Visit: Admitting: Physical Therapy

## 2024-12-04 ENCOUNTER — Other Ambulatory Visit: Payer: Self-pay

## 2024-12-04 ENCOUNTER — Encounter: Payer: Self-pay | Admitting: Physical Therapy

## 2024-12-04 DIAGNOSIS — M5459 Other low back pain: Secondary | ICD-10-CM | POA: Diagnosis not present

## 2024-12-04 DIAGNOSIS — M6281 Muscle weakness (generalized): Secondary | ICD-10-CM | POA: Diagnosis not present

## 2024-12-04 NOTE — Therapy (Signed)
 " OUTPATIENT PHYSICAL THERAPY TREATMENT   Patient Name: [name] MRN: [id number] DOB:1946-01-22, 79 y.o., male Today's Date: 12/04/2024   END OF SESSION:  PT End of Session - 12/04/24 1352     Visit Number 20    Number of Visits 23    Date for Recertification  01/14/25    Authorization Type [employer]    PT Start Time 1304    PT Stop Time 1345    PT Time Calculation (min) 41 min    Activity Tolerance Patient tolerated treatment well    Behavior During Therapy WFL for tasks assessed/performed                             Past Medical History:  Diagnosis Date   Complete heart block (HCC)    S/P  PPM by Dr Kelsie   Coronary artery disease    Non-obstructive. Pt had left heart catheterization in August 2009 showing a 30% mid-circumflex lesion, otherwise coronaries were clean angiographically   Hyperlipidemia    Hypertension    OSA (obstructive sleep apnea)    Probable; study never scheduled   Pacemaker 04/04/2024   Presence of Watchman left atrial appendage closure device 07/31/2024   31 mm Watchman Device, JP   Past Surgical History:  Procedure Laterality Date   BIV UPGRADE N/A 04/04/2024   Procedure: BIV UPGRADE;  Surgeon: Mealor, Eulas BRAVO, MD;  Location: [facility] INVASIVE CV LAB;  Service: Cardiovascular;  Laterality: N/A;   CARDIAC CATHETERIZATION  1993, 2009   2009- negative   COLONOSCOPY  2011   negative, [facility]   LEFT ATRIAL APPENDAGE OCCLUSION N/A 07/31/2024   Procedure: LEFT ATRIAL APPENDAGE OCCLUSION;  Surgeon: Kennyth Chew, MD;  Location: [facility] INVASIVE CV LAB;  Service: Cardiovascular;  Laterality: N/A;   LEFT HEART CATH AND CORONARY ANGIOGRAPHY N/A 03/04/2024   Procedure: LEFT HEART CATH AND CORONARY ANGIOGRAPHY;  Surgeon: Wonda Sharper, MD;  Location: [facility] INVASIVE CV LAB;  Service: Cardiovascular;  Laterality: N/A;   PACEMAKER INSERTION  01/13/11   MDT by MILUS for complete heart block   PPM GENERATOR CHANGEOUT N/A 04/04/2024    Procedure: PPM GENERATOR CHANGEOUT;  Surgeon: Nancey, Eulas BRAVO, MD;  Location: [facility] INVASIVE CV LAB;  Service: Cardiovascular;  Laterality: N/A;   TRANSESOPHAGEAL ECHOCARDIOGRAM (CATH LAB) N/A 07/31/2024   Procedure: TRANSESOPHAGEAL ECHOCARDIOGRAM;  Surgeon: Kennyth Chew, MD;  Location: [facility] INVASIVE CV LAB;  Service: Cardiovascular;  Laterality: N/A;   Patient Active Problem List   Diagnosis Date Noted   Presence of heart assist device (HCC) 09/04/2024   Presence of Watchman left atrial appendage closure device 07/31/2024   Persistent atrial fibrillation (HCC) 07/31/2024   Right leg swelling 07/28/2024   Pain in right lower leg 05/05/2024   CAD in native artery 02/14/2024   Chronic kidney disease, stage 3a (HCC) 02/10/2024   Cellulitis of left lower extremity 08/30/2023   Elevated LFTs 03/14/2023   Atrial flutter (HCC) 02/13/2023   Chronic left SI joint pain 12/22/2022   DOE (dyspnea on exertion) 12/22/2022   Chest pain, atypical 10/10/2022   Upper airway cough syndrome 10/10/2022   Chronic cough 02/01/2022   Chronic right SI joint pain 05/11/2021   Umbilical hernia without obstruction or gangrene 02/02/2021   Hepatic steatosis 08/05/2020   Aortic atherosclerosis 08/04/2020   Complex regional pain syndrome type I of the upper limb 07/26/2020   Flexion contracture of joint of hand 07/26/2020   Pacemaker  07/26/2020   Rib pain on left side 06/09/2020   Decreased pulses in feet 06/09/2020   Erectile dysfunction 03/03/2017   Chronic headaches 02/13/2017   Snoring 02/15/2015   Dupuytren's contracture of both hands 09/02/2013   Stiffness of right hand joint 05/07/2013   Gout 12/27/2011   Essential hypertension 01/12/2011   Complete heart block (HCC) - PPM 01/12/2011   Fatigue 01/12/2011   Prediabetes 03/02/2009   HYPERPLASIA PROSTATE UNS W/O UR OBST & OTH LUTS 08/13/2008   Hypercholesterolemia 02/17/2008   PAROXYSMAL SUPRAVENTRICULAR TACHYCARDIA 02/17/2008    PCP: Geofm Glade PARAS, MD  REFERRING PROVIDER: Joane Artist RAMAN, MD  REFERRING DIAG: Chronic bilateral low back pain without sciatica; Chronic SI joint pain; Chronic left shoulder pain  Rationale for Evaluation and Treatment: Rehabilitation  THERAPY DIAG:  Muscle weakness (generalized)  Other low back pain  ONSET DATE: Chronic   SUBJECTIVE:          SUBJECTIVE STATEMENT: Patient reports his SIJs are doing pretty good but the left neck is still bothering him.   Eval: Patient reports he became inactive years ago and they found that his SIJ are screwed up and they get better with shots. He has had a fall recently and had a new pacemaker put in. He reports that if he walks extended periods of time then he will be hurting right around the back of the hips. He states he fell while in the hospital and with his new pacemaker he is not allowed to lift more that 10 lbs with the left arm. He does report feeling slightly off balance. He always uses a hand rail when going up and down stairs.   PERTINENT HISTORY:  See PMH above  PAIN:  Are you having pain? Yes:  NPRS scale: 2/10 currently Pain location: Bilateral lower back and SIJ region Pain description: Stiff, ache Aggravating factors: Worse in morning, walking or standing extended periods, anything that requires bending Relieving factors: Patches or sprays, medication  PRECAUTIONS: Fall  WEIGHT BEARING RESTRICTIONS: No  FALLS:  Has patient fallen in last 6 months? Yes. Number of falls 1  PATIENT GOALS: Get more flexible and move better.    OBJECTIVE:  Note: Objective measures were completed at Evaluation unless otherwise noted. PATIENT SURVEYS:  Modified Oswestry: 11/50 (22% disability)  07/16/2024: 10/50 (20% disability) 09/10/2024: 8/50 (16% disability) 11/19/2024: 6/50 (12% disability)  MUSCLE LENGTH: Limitation with hamstring, quad and hip flexibility  POSTURE:  Rounded shoulder posture, remains in slightly knee flexed/crouched position,  decreased lumbar lordosis  PALPATION: Tender to palpation bilateral lumbar paraspinals, PSIS and SIJ region, piriformis region  Lumbar CPA hypomobile throughout  LUMBAR ROM:   AROM eval  Flexion Limited and tight  Extension Limited and painful  Right lateral flexion   Left lateral flexion   Right rotation Limited and tight  Left rotation Limited and tight   (Blank rows = not tested)  LOWER EXTREMITY ROM:    Hip PROM grossly limited in all directions with tightness reported at end ranges  LOWER EXTREMITY MMT:    MMT Right eval Left eval Rt / Lt 07/16/2024  Hip flexion 4- 4-   Hip extension 3- 3- 3- / 3-  Hip abduction 3 3 3  / 3  Hip adduction     Hip internal rotation     Hip external rotation     Knee flexion 4 4 4  / 4  Knee extension 4 4 4  / 4  Ankle dorsiflexion  Ankle plantarflexion     Ankle inversion     Ankle eversion      (Blank rows = not tested)  FUNCTIONAL TESTS:  5 times sit to stand: 15 seconds  07/16/2024: 15 seconds  09/10/2024: 14 seconds  11/19/2024: 14 seconds  Romberg stance: patient demonstrates greater difficulty with eyes closed   GAIT: Assistive device utilized: None Level of assistance: Complete Independence Comments: Remains in relative knee flexed/crouched, toe out, unsteady gait   TREATMENT [facility] Adult PT Treatment:                                                DATE: 12/04/2024 Recumbent bike L4 x 10 min to improve endurance and workload capacity Seated stability ball roll out x 10 Seated hamstring stretch 3 x 20 sec each Slant board calf stretch 3 x 20 sec Standing wall slide for shoulder elevation and thoracic extension x 10 Row with blue 3 x 15 Deadlift from 10 box with 30# 3 x 10  PATIENT EDUCATION:  Education details: HEP Person educated: Patient Education method: Programmer, Multimedia, Demonstration, Actor cues, Verbal cues Education comprehension: verbalized understanding, returned demonstration, verbal cues required, tactile  cues required, and needs further education  HOME EXERCISE PROGRAM: Access Code: [email or web address]    ASSESSMENT: CLINICAL IMPRESSION: Patient tolerated therapy well with no adverse effects. Therapy continues to focus on improving mobility and progressing his posterior chain strengthening with good tolerance. He was able to progress with weight for lifting exercise. No changes to HEP this visit. Patient would benefit from continued skilled PT to progress mobility and strength in order to reduce pain and maximize functional ability.   Eval: Patient is a 79 y.o. male who was seen today for physical therapy evaluation and treatment for chronic lower back and pelvic pain, and recent fall.   OBJECTIVE IMPAIRMENTS: Abnormal gait, decreased activity tolerance, decreased balance, decreased ROM, decreased strength, impaired flexibility, postural dysfunction, and pain.   ACTIVITY LIMITATIONS: carrying, lifting, bending, standing, stairs, and locomotion level  PARTICIPATION LIMITATIONS: meal prep, cleaning, shopping, and community activity  PERSONAL FACTORS: Fitness, Past/current experiences, and Time since onset of injury/illness/exacerbation are also affecting patient's functional outcome.    GOALS: Goals reviewed with patient? Yes  SHORT TERM GOALS: Target date: 06/04/2024  Patient will be I with initial HEP in order to progress with therapy. Baseline: HEP provided at eval 06/11/2024: independent Goal status: MET  LONG TERM GOALS: Target date: 01/14/2025  Patient will be I with final HEP to maintain progress from PT. Baseline: HEP provided at eval 07/16/2024: progressing 09/10/2024: progressing 11/19/2024: progressing Goal status: ONGOING  2.  Patient will report Modified Oswestry </= 5/50 (10% disability) in order to indicate an improvement in their functional status Baseline: 11/50 (22%) 07/16/2024: 10/50 (20% disability) 09/10/2024: 8/50 (16% disability) 11/19/2024: 6/50 (12% disability) Goal  status: ONGOING  3.  Patient will demonstrate 5xSTS </= 12 seconds to indicate improvement in strength and reduce fall risk Baseline: 15 seconds 07/16/2024: 15 seconds 09/10/2024: 14 seconds 11/19/2024: 14 seconds Goal status: ONGOING  4.  Patient will be able to walk for extended periods without need to stop due to pain in order to improve his functional ability Baseline: patient reports frequent stops while walking due to pain 07/16/2024: reports continued limitation 09/10/2024: reports continued limitation 11/19/2024: reports continued limitation Goal status: ONGOING   PLAN: PT FREQUENCY:  1-2x/week  PT DURATION: 8 weeks  PLANNED INTERVENTIONS: 97164- PT Re-evaluation, 97750- Physical Performance Testing, 97110-Therapeutic exercises, 97530- Therapeutic activity, 97112- Neuromuscular re-education, 97535- Self Care, 02859- Manual therapy, 20560 (1-2 muscles), 20561 (3+ muscles)- Dry Needling, Patient/Family education, Balance training, Stair training, Taping, Joint mobilization, Joint manipulation, Spinal manipulation, Spinal mobilization, Cryotherapy, and Moist heat.  PLAN FOR NEXT SESSION: Review HEP and progress PRN, continue to work on LE and hip flexibility, progress LE, core, hip strengthening to improve activity tolerance, incorporate balance and stability exercises   Elaine Daring, PT, DPT, LAT, ATC 12/04/24  1:58 PM Phone: [phone] Fax: [phone]   "

## 2024-12-05 ENCOUNTER — Encounter: Payer: Self-pay | Admitting: Family Medicine

## 2024-12-05 ENCOUNTER — Ambulatory Visit: Admitting: Family Medicine

## 2024-12-05 VITALS — BP 120/80 | HR 101 | Ht 77.0 in | Wt 231.0 lb

## 2024-12-05 DIAGNOSIS — M533 Sacrococcygeal disorders, not elsewhere classified: Secondary | ICD-10-CM | POA: Diagnosis not present

## 2024-12-05 DIAGNOSIS — G8929 Other chronic pain: Secondary | ICD-10-CM

## 2024-12-05 DIAGNOSIS — R222 Localized swelling, mass and lump, trunk: Secondary | ICD-10-CM | POA: Diagnosis not present

## 2024-12-05 DIAGNOSIS — M47816 Spondylosis without myelopathy or radiculopathy, lumbar region: Secondary | ICD-10-CM

## 2024-12-05 NOTE — Patient Instructions (Addendum)
Thank you for coming in today.   You received an injection today. Seek immediate medical attention if the joint becomes red, extremely painful, or is oozing fluid.   Check back in 3 months 

## 2024-12-05 NOTE — Progress Notes (Signed)
 "        I, Leotis Batter, CMA acting as a scribe for Artist Lloyd, MD.  [name] is a 79 y.o. male who presents to [facility] today for 44-month f/u chronic low back/SI joint pain. Pt was last seen by Dr. Lloyd on 09/04/24 and was given repeat bilat SI joint steroid injections.  Pt cont PT, completing 20 visits.  Today, pt reports improvement of sx with PT. Feels like strengthening exercises have been helpful. Not compliant with HEP. Feeling a lot better overall.   He notes he has a nodule or bump on the left neck area.  He notes it is a little red and he has been applying some lotion to it.  He denies fevers or chills or night sweats.  Pertinent review of systems: No fevers or chills  Relevant historical information: Atrial fibrillation   Exam:  BP 120/80   Pulse (!) 101   Ht 6' 5 (1.956 m)   Wt 231 lb (104.8 kg)   SpO2 97%   BMI 27.39 kg/m  General: Well Developed, well nourished, and in no acute distress.   MSK: Tender palpation SI joints bilaterally.  Skin about 1 cm nodule superficial left lateral neck.  Nontender mobile.    Lab and Radiology Results  Procedure: Real-time Ultrasound Guided Injection of right SI joint Device: Philips Affiniti 50G/GE Logiq Images permanently stored and available for review in PACS Verbal informed consent obtained.  Discussed risks and benefits of procedure. Warned about infection, bleeding, hyperglycemia damage to structures among others. Patient expresses understanding and agreement Time-out conducted.   Noted no overlying erythema, induration, or other signs of local infection.   Skin prepped in a sterile fashion.   Local anesthesia: Topical Ethyl chloride.   With sterile technique and under real time ultrasound guidance: 40 mg of Kenalog  and 2 mL of Marcaine injected into SI joint. Fluid seen entering the joint capsule.   Completed without difficulty   Pain immediately resolved suggesting accurate  placement of the medication.   Advised to call if fevers/chills, erythema, induration, drainage, or persistent bleeding.   Images permanently stored and available for review in the ultrasound unit.  Impression: Technically successful ultrasound guided injection.    Procedure: Real-time Ultrasound Guided Injection of left SI joint Device: Philips Affiniti 50G/GE Logiq Images permanently stored and available for review in PACS Verbal informed consent obtained.  Discussed risks and benefits of procedure. Warned about infection, bleeding, hyperglycemia damage to structures among others. Patient expresses understanding and agreement Time-out conducted.   Noted no overlying erythema, induration, or other signs of local infection.   Skin prepped in a sterile fashion.   Local anesthesia: Topical Ethyl chloride.   With sterile technique and under real time ultrasound guidance: 40 mg of Kenalog  and 2 mL of Marcaine injected into SI joint. Fluid seen entering the joint capsule.   Completed without difficulty   Pain immediately resolved suggesting accurate placement of the medication.   Advised to call if fevers/chills, erythema, induration, drainage, or persistent bleeding.   Images permanently stored and available for review in the ultrasound unit.  Impression: Technically successful ultrasound guided injection.         Assessment and Plan: 79 y.o. male with recurrent low back pain.  Plan for repeat SI joint injection.  Physical therapy so far has been pretty helpful to mitigate some of this back pain.  Plan to continue PT and home exercise program.  Nodule  left lateral neck unclear etiology.  If not improving will consider advanced imaging such as CT scan or follow-up with PCP   PDMP not reviewed this encounter. Orders Placed This Encounter  Procedures   US  LIMITED JOINT SPACE STRUCTURES LOW BILAT(NO LINKED CHARGES)    Reason for Exam (SYMPTOM  OR DIAGNOSIS REQUIRED):   bilat SI joint  pain    Preferred imaging location?:   [facility]   No orders of the defined types were placed in this encounter.    Discussed warning signs or symptoms. Please see discharge instructions. Patient expresses understanding.   The above documentation has been reviewed and is accurate and complete Artist Lloyd, M.D.   "

## 2024-12-09 ENCOUNTER — Encounter: Admitting: Physical Therapy

## 2024-12-11 ENCOUNTER — Encounter: Payer: Self-pay | Admitting: Physical Therapy

## 2024-12-11 ENCOUNTER — Ambulatory Visit: Admitting: Physical Therapy

## 2024-12-11 ENCOUNTER — Other Ambulatory Visit: Payer: Self-pay

## 2024-12-11 DIAGNOSIS — M6281 Muscle weakness (generalized): Secondary | ICD-10-CM | POA: Diagnosis not present

## 2024-12-11 DIAGNOSIS — M5459 Other low back pain: Secondary | ICD-10-CM

## 2024-12-11 NOTE — Therapy (Signed)
 " OUTPATIENT PHYSICAL THERAPY TREATMENT   Patient Name: [name] MRN: [id number] DOB:Aug 29, 1946, 79 y.o., male Today's Date: 12/11/2024   END OF SESSION:  PT End of Session - 12/11/24 1410     Visit Number 21    Number of Visits 23    Date for Recertification  01/14/25    Authorization Type [employer]    PT Start Time 1347    PT Stop Time 1430    PT Time Calculation (min) 43 min    Activity Tolerance Patient tolerated treatment well    Behavior During Therapy WFL for tasks assessed/performed                              Past Medical History:  Diagnosis Date   Complete heart block (HCC)    S/P  PPM by Dr Kelsie   Coronary artery disease    Non-obstructive. Pt had left heart catheterization in August 2009 showing a 30% mid-circumflex lesion, otherwise coronaries were clean angiographically   Hyperlipidemia    Hypertension    OSA (obstructive sleep apnea)    Probable; study never scheduled   Pacemaker 04/04/2024   Presence of Watchman left atrial appendage closure device 07/31/2024   31 mm Watchman Device, JP   Past Surgical History:  Procedure Laterality Date   BIV UPGRADE N/A 04/04/2024   Procedure: BIV UPGRADE;  Surgeon: Mealor, Eulas BRAVO, MD;  Location: [facility] INVASIVE CV [facility];  Service: Cardiovascular;  Laterality: N/A;   CARDIAC CATHETERIZATION  1993, 2009   2009- negative   COLONOSCOPY  2011   negative, [facility]   LEFT ATRIAL APPENDAGE OCCLUSION N/A 07/31/2024   Procedure: LEFT ATRIAL APPENDAGE OCCLUSION;  Surgeon: Kennyth Chew, MD;  Location: [facility] INVASIVE CV LAB;  Service: Cardiovascular;  Laterality: N/A;   LEFT HEART CATH AND CORONARY ANGIOGRAPHY N/A 03/04/2024   Procedure: LEFT HEART CATH AND CORONARY ANGIOGRAPHY;  Surgeon: Wonda Sharper, MD;  Location: [facility] INVASIVE CV LAB;  Service: Cardiovascular;  Laterality: N/A;   PACEMAKER INSERTION  01/13/11   MDT by MILUS for complete heart block   PPM GENERATOR CHANGEOUT N/A 04/04/2024    Procedure: PPM GENERATOR CHANGEOUT;  Surgeon: Nancey, Eulas BRAVO, MD;  Location: [facility] INVASIVE CV [facility];  Service: Cardiovascular;  Laterality: N/A;   TRANSESOPHAGEAL ECHOCARDIOGRAM (CATH LAB) N/A 07/31/2024   Procedure: TRANSESOPHAGEAL ECHOCARDIOGRAM;  Surgeon: Kennyth Chew, MD;  Location: [facility] INVASIVE CV LAB;  Service: Cardiovascular;  Laterality: N/A;   Patient Active Problem List   Diagnosis Date Noted   Presence of heart assist device (HCC) 09/04/2024   Presence of Watchman left atrial appendage closure device 07/31/2024   Persistent atrial fibrillation (HCC) 07/31/2024   Right leg swelling 07/28/2024   Pain in right lower leg 05/05/2024   CAD in native artery 02/14/2024   Chronic kidney disease, stage 3a (HCC) 02/10/2024   Cellulitis of left lower extremity 08/30/2023   Elevated LFTs 03/14/2023   Atrial flutter (HCC) 02/13/2023   Chronic left SI joint pain 12/22/2022   DOE (dyspnea on exertion) 12/22/2022   Chest pain, atypical 10/10/2022   Upper airway cough syndrome 10/10/2022   Chronic cough 02/01/2022   Chronic right SI joint pain 05/11/2021   Umbilical hernia without obstruction or gangrene 02/02/2021   Hepatic steatosis 08/05/2020   Aortic atherosclerosis 08/04/2020   Complex regional pain syndrome type I of the upper limb 07/26/2020   Flexion contracture of joint of hand 07/26/2020  Pacemaker 07/26/2020   Rib pain on left side 06/09/2020   Decreased pulses in feet 06/09/2020   Erectile dysfunction 03/03/2017   Chronic headaches 02/13/2017   Snoring 02/15/2015   Dupuytren's contracture of both hands 09/02/2013   Stiffness of right hand joint 05/07/2013   Gout 12/27/2011   Essential hypertension 01/12/2011   Complete heart block (HCC) - PPM 01/12/2011   Fatigue 01/12/2011   Prediabetes 03/02/2009   HYPERPLASIA PROSTATE UNS W/O UR OBST & OTH LUTS 08/13/2008   Hypercholesterolemia 02/17/2008   PAROXYSMAL SUPRAVENTRICULAR TACHYCARDIA 02/17/2008    PCP: Geofm Glade PARAS, MD  REFERRING PROVIDER: Joane Artist RAMAN, MD  REFERRING DIAG: Chronic bilateral low back pain without sciatica; Chronic SI joint pain; Chronic left shoulder pain  Rationale for Evaluation and Treatment: Rehabilitation  THERAPY DIAG:  Muscle weakness (generalized)  Other low back pain  ONSET DATE: Chronic   SUBJECTIVE:          SUBJECTIVE STATEMENT: Patient reports he is doing good. He saw the doctors and got SIJ injections which continue to help.  Eval: Patient reports he became inactive years ago and they found that his SIJ are screwed up and they get better with shots. He has had a fall recently and had a new pacemaker put in. He reports that if he walks extended periods of time then he will be hurting right around the back of the hips. He states he fell while in the hospital and with his new pacemaker he is not allowed to lift more that 10 lbs with the left arm. He does report feeling slightly off balance. He always uses a hand rail when going up and down stairs.   PERTINENT HISTORY:  See PMH above  PAIN:  Are you having pain? Yes:  NPRS scale: 2/10 currently Pain location: Bilateral lower back and SIJ region Pain description: Stiff, ache Aggravating factors: Worse in morning, walking or standing extended periods, anything that requires bending Relieving factors: Patches or sprays, medication  PRECAUTIONS: Fall  WEIGHT BEARING RESTRICTIONS: No  FALLS:  Has patient fallen in last 6 months? Yes. Number of falls 1  PATIENT GOALS: Get more flexible and move better.    OBJECTIVE:  Note: Objective measures were completed at Evaluation unless otherwise noted. PATIENT SURVEYS:  Modified Oswestry: 11/50 (22% disability)  07/16/2024: 10/50 (20% disability) 09/10/2024: 8/50 (16% disability) 11/19/2024: 6/50 (12% disability)  MUSCLE LENGTH: Limitation with hamstring, quad and hip flexibility  POSTURE:  Rounded shoulder posture, remains in slightly knee flexed/crouched  position, decreased lumbar lordosis  PALPATION: Tender to palpation bilateral lumbar paraspinals, PSIS and SIJ region, piriformis region  Lumbar CPA hypomobile throughout  LUMBAR ROM:   AROM eval  Flexion Limited and tight  Extension Limited and painful  Right lateral flexion   Left lateral flexion   Right rotation Limited and tight  Left rotation Limited and tight   (Blank rows = not tested)  LOWER EXTREMITY ROM:    Hip PROM grossly limited in all directions with tightness reported at end ranges  LOWER EXTREMITY MMT:    MMT Right eval Left eval Rt / Lt 07/16/2024  Hip flexion 4- 4-   Hip extension 3- 3- 3- / 3-  Hip abduction 3 3 3  / 3  Hip adduction     Hip internal rotation     Hip external rotation     Knee flexion 4 4 4  / 4  Knee extension 4 4 4  / 4  Ankle dorsiflexion  Ankle plantarflexion     Ankle inversion     Ankle eversion      (Blank rows = not tested)  FUNCTIONAL TESTS:  5 times sit to stand: 15 seconds  07/16/2024: 15 seconds  09/10/2024: 14 seconds  11/19/2024: 14 seconds  Romberg stance: patient demonstrates greater difficulty with eyes closed   GAIT: Assistive device utilized: None Level of assistance: Complete Independence Comments: Remains in relative knee flexed/crouched, toe out, unsteady gait   TREATMENT [facility] Adult PT Treatment:                                                DATE: 12/11/2024 Recumbent bike L4 x 10 min to improve endurance and workload capacity Seated hamstring stretch 3 x 20 sec each Standing hip flexor stretch / pelvic rotation with front foot on 14 box 10 x 5 sec each  Slant board calf stretch 3 x 20 sec Standing wall slide for shoulder elevation and thoracic extension x 10 Deadlift from 10 box with 30# 3 x 10 Standing shoulder extension/lat pull down with blue 3 x 12 Forward step-up on 6 box 2 x 10 each  PATIENT EDUCATION:  Education details: HEP Person educated: Patient Education method: Programmer, Multimedia,  Demonstration, Actor cues, Verbal cues Education comprehension: verbalized understanding, returned demonstration, verbal cues required, tactile cues required, and needs further education  HOME EXERCISE PROGRAM: Access Code: [email or web address]    ASSESSMENT: CLINICAL IMPRESSION: Patient tolerated therapy well with no adverse effects. Therapy continues to focus on progressing his endurance, strength, and activity tolerance. He was able to progress with his lifting and LE strengthening without any report of increased pain. He notes improvement in neck stiffness but still occurs occasionally. No changes made to HEP this visit. Patient would benefit from continued skilled PT to progress mobility and strength in order to reduce pain and maximize functional ability.   Eval: Patient is a 79 y.o. male who was seen today for physical therapy evaluation and treatment for chronic lower back and pelvic pain, and recent fall.   OBJECTIVE IMPAIRMENTS: Abnormal gait, decreased activity tolerance, decreased balance, decreased ROM, decreased strength, impaired flexibility, postural dysfunction, and pain.   ACTIVITY LIMITATIONS: carrying, lifting, bending, standing, stairs, and locomotion level  PARTICIPATION LIMITATIONS: meal prep, cleaning, shopping, and community activity  PERSONAL FACTORS: Fitness, Past/current experiences, and Time since onset of injury/illness/exacerbation are also affecting patient's functional outcome.    GOALS: Goals reviewed with patient? Yes  SHORT TERM GOALS: Target date: 06/04/2024  Patient will be I with initial HEP in order to progress with therapy. Baseline: HEP provided at eval 06/11/2024: independent Goal status: MET  LONG TERM GOALS: Target date: 01/14/2025  Patient will be I with final HEP to maintain progress from PT. Baseline: HEP provided at eval 07/16/2024: progressing 09/10/2024: progressing 11/19/2024: progressing Goal status: ONGOING  2.  Patient will report  Modified Oswestry </= 5/50 (10% disability) in order to indicate an improvement in their functional status Baseline: 11/50 (22%) 07/16/2024: 10/50 (20% disability) 09/10/2024: 8/50 (16% disability) 11/19/2024: 6/50 (12% disability) Goal status: ONGOING  3.  Patient will demonstrate 5xSTS </= 12 seconds to indicate improvement in strength and reduce fall risk Baseline: 15 seconds 07/16/2024: 15 seconds 09/10/2024: 14 seconds 11/19/2024: 14 seconds Goal status: ONGOING  4.  Patient will be able to walk for extended periods without need to stop due  to pain in order to improve his functional ability Baseline: patient reports frequent stops while walking due to pain 07/16/2024: reports continued limitation 09/10/2024: reports continued limitation 11/19/2024: reports continued limitation Goal status: ONGOING   PLAN: PT FREQUENCY: 1-2x/week  PT DURATION: 8 weeks  PLANNED INTERVENTIONS: 97164- PT Re-evaluation, 97750- Physical Performance Testing, 97110-Therapeutic exercises, 97530- Therapeutic activity, 97112- Neuromuscular re-education, 97535- Self Care, 02859- Manual therapy, 20560 (1-2 muscles), 20561 (3+ muscles)- Dry Needling, Patient/Family education, Balance training, Stair training, Taping, Joint mobilization, Joint manipulation, Spinal manipulation, Spinal mobilization, Cryotherapy, and Moist heat.  PLAN FOR NEXT SESSION: Review HEP and progress PRN, continue to work on LE and hip flexibility, progress LE, core, hip strengthening to improve activity tolerance, incorporate balance and stability exercises   Elaine Daring, PT, DPT, LAT, ATC 12/11/24  2:44 PM Phone: [phone] Fax: [phone]   "

## 2025-01-07 ENCOUNTER — Encounter

## 2025-01-15 ENCOUNTER — Encounter: Admitting: Physical Therapy

## 2025-01-29 ENCOUNTER — Ambulatory Visit: Admitting: Physician Assistant

## 2025-03-05 ENCOUNTER — Ambulatory Visit: Admitting: Internal Medicine

## 2025-03-05 ENCOUNTER — Ambulatory Visit: Admitting: Family Medicine

## 2025-04-08 ENCOUNTER — Encounter

## 2025-07-08 ENCOUNTER — Encounter

## 2025-10-07 ENCOUNTER — Encounter

## 2026-01-06 ENCOUNTER — Encounter

## 2026-04-07 ENCOUNTER — Encounter
